# Patient Record
Sex: Male | Born: 1955
Health system: Southern US, Community
[De-identification: ages and names within clinical notes are randomized; demographics above are authoritative.]

## PROBLEM LIST (undated history)

## (undated) DIAGNOSIS — K759 Inflammatory liver disease, unspecified: Secondary | ICD-10-CM

## (undated) DIAGNOSIS — J189 Pneumonia, unspecified organism: Secondary | ICD-10-CM

## (undated) DIAGNOSIS — E785 Hyperlipidemia, unspecified: Secondary | ICD-10-CM

## (undated) DIAGNOSIS — F32A Depression, unspecified: Secondary | ICD-10-CM

## (undated) DIAGNOSIS — E119 Type 2 diabetes mellitus without complications: Secondary | ICD-10-CM

## (undated) DIAGNOSIS — Z87442 Personal history of urinary calculi: Secondary | ICD-10-CM

## (undated) DIAGNOSIS — R0683 Snoring: Secondary | ICD-10-CM

## (undated) DIAGNOSIS — Z91148 Patient's other noncompliance with medication regimen for other reason: Secondary | ICD-10-CM

## (undated) DIAGNOSIS — N289 Disorder of kidney and ureter, unspecified: Secondary | ICD-10-CM

## (undated) DIAGNOSIS — F329 Major depressive disorder, single episode, unspecified: Secondary | ICD-10-CM

## (undated) DIAGNOSIS — G4730 Sleep apnea, unspecified: Secondary | ICD-10-CM

## (undated) DIAGNOSIS — J40 Bronchitis, not specified as acute or chronic: Secondary | ICD-10-CM

## (undated) DIAGNOSIS — I10 Essential (primary) hypertension: Secondary | ICD-10-CM

## (undated) HISTORY — DX: Major depressive disorder, single episode, unspecified: F32.9

## (undated) HISTORY — PX: JOINT REPLACEMENT: SHX530

## (undated) HISTORY — DX: Hyperlipidemia, unspecified: E78.5

## (undated) HISTORY — DX: Snoring: R06.83

## (undated) HISTORY — PX: COLONOSCOPY: SHX174

## (undated) HISTORY — PX: TOTAL HIP ARTHROPLASTY: SHX124

## (undated) HISTORY — DX: Depression, unspecified: F32.A

---
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
## 1998-01-17 ENCOUNTER — Emergency Department (HOSPITAL_COMMUNITY): Admission: EM | Admit: 1998-01-17 | Discharge: 1998-01-17 | Payer: Self-pay | Admitting: Emergency Medicine

## 1998-04-19 ENCOUNTER — Emergency Department (HOSPITAL_COMMUNITY): Admission: EM | Admit: 1998-04-19 | Discharge: 1998-04-19 | Payer: Self-pay | Admitting: Emergency Medicine

## 1998-09-02 ENCOUNTER — Emergency Department (HOSPITAL_COMMUNITY): Admission: EM | Admit: 1998-09-02 | Discharge: 1998-09-02 | Payer: Self-pay | Admitting: Emergency Medicine

## 1998-09-03 ENCOUNTER — Encounter: Payer: Self-pay | Admitting: Emergency Medicine

## 1998-11-17 ENCOUNTER — Emergency Department (HOSPITAL_COMMUNITY): Admission: EM | Admit: 1998-11-17 | Discharge: 1998-11-17 | Payer: Self-pay | Admitting: Emergency Medicine

## 1999-07-05 HISTORY — PX: BACK SURGERY: SHX140

## 1999-07-19 ENCOUNTER — Emergency Department (HOSPITAL_COMMUNITY): Admission: EM | Admit: 1999-07-19 | Discharge: 1999-07-19 | Payer: Self-pay | Admitting: Emergency Medicine

## 2000-04-29 ENCOUNTER — Emergency Department (HOSPITAL_COMMUNITY): Admission: EM | Admit: 2000-04-29 | Discharge: 2000-04-29 | Payer: Self-pay | Admitting: Emergency Medicine

## 2000-04-29 ENCOUNTER — Encounter: Payer: Self-pay | Admitting: Emergency Medicine

## 2000-07-10 ENCOUNTER — Inpatient Hospital Stay (HOSPITAL_COMMUNITY): Admission: RE | Admit: 2000-07-10 | Discharge: 2000-07-14 | Payer: Self-pay | Admitting: Orthopaedic Surgery

## 2001-01-28 ENCOUNTER — Emergency Department (HOSPITAL_COMMUNITY): Admission: EM | Admit: 2001-01-28 | Discharge: 2001-01-28 | Payer: Self-pay | Admitting: Emergency Medicine

## 2001-04-23 ENCOUNTER — Emergency Department (HOSPITAL_COMMUNITY): Admission: EM | Admit: 2001-04-23 | Discharge: 2001-04-24 | Payer: Self-pay | Admitting: Emergency Medicine

## 2001-08-14 ENCOUNTER — Emergency Department (HOSPITAL_COMMUNITY): Admission: EM | Admit: 2001-08-14 | Discharge: 2001-08-14 | Payer: Self-pay | Admitting: Emergency Medicine

## 2001-10-18 ENCOUNTER — Emergency Department (HOSPITAL_COMMUNITY): Admission: EM | Admit: 2001-10-18 | Discharge: 2001-10-19 | Payer: Self-pay | Admitting: Emergency Medicine

## 2002-08-08 ENCOUNTER — Emergency Department (HOSPITAL_COMMUNITY): Admission: EM | Admit: 2002-08-08 | Discharge: 2002-08-08 | Payer: Self-pay | Admitting: Emergency Medicine

## 2002-08-08 ENCOUNTER — Encounter: Payer: Self-pay | Admitting: Emergency Medicine

## 2002-09-29 ENCOUNTER — Encounter: Payer: Self-pay | Admitting: *Deleted

## 2002-09-29 ENCOUNTER — Emergency Department (HOSPITAL_COMMUNITY): Admission: EM | Admit: 2002-09-29 | Discharge: 2002-09-29 | Payer: Self-pay | Admitting: Emergency Medicine

## 2002-12-20 ENCOUNTER — Emergency Department (HOSPITAL_COMMUNITY): Admission: EM | Admit: 2002-12-20 | Discharge: 2002-12-20 | Payer: Self-pay | Admitting: *Deleted

## 2002-12-20 ENCOUNTER — Encounter: Payer: Self-pay | Admitting: *Deleted

## 2004-02-15 ENCOUNTER — Emergency Department (HOSPITAL_COMMUNITY): Admission: EM | Admit: 2004-02-15 | Discharge: 2004-02-15 | Payer: Self-pay | Admitting: *Deleted

## 2004-03-15 ENCOUNTER — Emergency Department (HOSPITAL_COMMUNITY): Admission: EM | Admit: 2004-03-15 | Discharge: 2004-03-15 | Payer: Self-pay | Admitting: Emergency Medicine

## 2005-03-05 ENCOUNTER — Emergency Department (HOSPITAL_COMMUNITY): Admission: EM | Admit: 2005-03-05 | Discharge: 2005-03-05 | Payer: Self-pay | Admitting: Family Medicine

## 2005-07-19 ENCOUNTER — Emergency Department (HOSPITAL_COMMUNITY): Admission: EM | Admit: 2005-07-19 | Discharge: 2005-07-19 | Payer: Self-pay | Admitting: Emergency Medicine

## 2006-02-09 ENCOUNTER — Emergency Department (HOSPITAL_COMMUNITY): Admission: EM | Admit: 2006-02-09 | Discharge: 2006-02-09 | Payer: Self-pay | Admitting: Emergency Medicine

## 2006-04-28 ENCOUNTER — Emergency Department (HOSPITAL_COMMUNITY): Admission: EM | Admit: 2006-04-28 | Discharge: 2006-04-28 | Payer: Self-pay | Admitting: Emergency Medicine

## 2006-06-21 ENCOUNTER — Emergency Department (HOSPITAL_COMMUNITY): Admission: EM | Admit: 2006-06-21 | Discharge: 2006-06-21 | Payer: Self-pay | Admitting: Emergency Medicine

## 2006-09-26 ENCOUNTER — Emergency Department (HOSPITAL_COMMUNITY): Admission: EM | Admit: 2006-09-26 | Discharge: 2006-09-26 | Payer: Self-pay | Admitting: Emergency Medicine

## 2006-09-26 ENCOUNTER — Encounter: Payer: Self-pay | Admitting: Vascular Surgery

## 2006-09-26 ENCOUNTER — Ambulatory Visit: Payer: Self-pay | Admitting: Vascular Surgery

## 2006-10-10 ENCOUNTER — Ambulatory Visit: Payer: Self-pay | Admitting: Internal Medicine

## 2006-10-20 ENCOUNTER — Emergency Department (HOSPITAL_COMMUNITY): Admission: EM | Admit: 2006-10-20 | Discharge: 2006-10-20 | Payer: Self-pay | Admitting: Emergency Medicine

## 2006-10-24 ENCOUNTER — Ambulatory Visit: Payer: Self-pay | Admitting: Internal Medicine

## 2006-12-12 ENCOUNTER — Emergency Department (HOSPITAL_COMMUNITY): Admission: EM | Admit: 2006-12-12 | Discharge: 2006-12-12 | Payer: Self-pay | Admitting: Emergency Medicine

## 2007-01-06 ENCOUNTER — Emergency Department (HOSPITAL_COMMUNITY): Admission: EM | Admit: 2007-01-06 | Discharge: 2007-01-06 | Payer: Self-pay | Admitting: Family Medicine

## 2007-03-01 ENCOUNTER — Emergency Department (HOSPITAL_COMMUNITY): Admission: EM | Admit: 2007-03-01 | Discharge: 2007-03-01 | Payer: Self-pay | Admitting: Emergency Medicine

## 2007-03-03 ENCOUNTER — Emergency Department (HOSPITAL_COMMUNITY): Admission: EM | Admit: 2007-03-03 | Discharge: 2007-03-03 | Payer: Self-pay | Admitting: Emergency Medicine

## 2007-09-19 ENCOUNTER — Emergency Department (HOSPITAL_COMMUNITY): Admission: EM | Admit: 2007-09-19 | Discharge: 2007-09-19 | Payer: Self-pay | Admitting: Emergency Medicine

## 2007-09-23 ENCOUNTER — Emergency Department (HOSPITAL_COMMUNITY): Admission: EM | Admit: 2007-09-23 | Discharge: 2007-09-24 | Payer: Self-pay | Admitting: Emergency Medicine

## 2007-09-30 ENCOUNTER — Emergency Department (HOSPITAL_COMMUNITY): Admission: EM | Admit: 2007-09-30 | Discharge: 2007-09-30 | Payer: Self-pay | Admitting: Emergency Medicine

## 2008-01-22 ENCOUNTER — Emergency Department (HOSPITAL_COMMUNITY): Admission: EM | Admit: 2008-01-22 | Discharge: 2008-01-22 | Payer: Self-pay | Admitting: Emergency Medicine

## 2008-02-20 ENCOUNTER — Emergency Department (HOSPITAL_COMMUNITY): Admission: EM | Admit: 2008-02-20 | Discharge: 2008-02-20 | Payer: Self-pay | Admitting: Emergency Medicine

## 2008-03-02 ENCOUNTER — Emergency Department (HOSPITAL_COMMUNITY): Admission: EM | Admit: 2008-03-02 | Discharge: 2008-03-02 | Payer: Self-pay | Admitting: Emergency Medicine

## 2008-03-31 ENCOUNTER — Emergency Department (HOSPITAL_COMMUNITY): Admission: EM | Admit: 2008-03-31 | Discharge: 2008-03-31 | Payer: Self-pay | Admitting: Emergency Medicine

## 2008-05-22 ENCOUNTER — Emergency Department (HOSPITAL_COMMUNITY): Admission: EM | Admit: 2008-05-22 | Discharge: 2008-05-22 | Payer: Self-pay | Admitting: Emergency Medicine

## 2008-07-01 ENCOUNTER — Emergency Department (HOSPITAL_COMMUNITY): Admission: EM | Admit: 2008-07-01 | Discharge: 2008-07-01 | Payer: Self-pay | Admitting: Emergency Medicine

## 2008-08-25 ENCOUNTER — Emergency Department (HOSPITAL_COMMUNITY): Admission: EM | Admit: 2008-08-25 | Discharge: 2008-08-25 | Payer: Self-pay | Admitting: Emergency Medicine

## 2008-12-26 ENCOUNTER — Encounter: Admission: RE | Admit: 2008-12-26 | Discharge: 2008-12-26 | Payer: Self-pay | Admitting: Orthopaedic Surgery

## 2009-04-16 ENCOUNTER — Emergency Department (HOSPITAL_COMMUNITY): Admission: EM | Admit: 2009-04-16 | Discharge: 2009-04-16 | Payer: Self-pay | Admitting: Emergency Medicine

## 2009-05-15 ENCOUNTER — Emergency Department (HOSPITAL_COMMUNITY): Admission: EM | Admit: 2009-05-15 | Discharge: 2009-05-15 | Payer: Self-pay | Admitting: Emergency Medicine

## 2009-05-23 ENCOUNTER — Emergency Department (HOSPITAL_COMMUNITY): Admission: EM | Admit: 2009-05-23 | Discharge: 2009-05-23 | Payer: Self-pay | Admitting: Emergency Medicine

## 2009-06-08 ENCOUNTER — Inpatient Hospital Stay (HOSPITAL_COMMUNITY): Admission: RE | Admit: 2009-06-08 | Discharge: 2009-06-11 | Payer: Self-pay | Admitting: Orthopaedic Surgery

## 2009-06-12 ENCOUNTER — Emergency Department (HOSPITAL_COMMUNITY): Admission: EM | Admit: 2009-06-12 | Discharge: 2009-06-13 | Payer: Self-pay | Admitting: Emergency Medicine

## 2009-06-21 ENCOUNTER — Emergency Department (HOSPITAL_COMMUNITY): Admission: EM | Admit: 2009-06-21 | Discharge: 2009-06-21 | Payer: Self-pay | Admitting: Emergency Medicine

## 2009-07-06 ENCOUNTER — Emergency Department (HOSPITAL_COMMUNITY): Admission: EM | Admit: 2009-07-06 | Discharge: 2009-07-06 | Payer: Self-pay | Admitting: Emergency Medicine

## 2009-07-12 ENCOUNTER — Emergency Department (HOSPITAL_COMMUNITY): Admission: EM | Admit: 2009-07-12 | Discharge: 2009-07-12 | Payer: Self-pay | Admitting: Emergency Medicine

## 2009-08-25 ENCOUNTER — Emergency Department (HOSPITAL_COMMUNITY): Admission: EM | Admit: 2009-08-25 | Discharge: 2009-08-25 | Payer: Self-pay | Admitting: Emergency Medicine

## 2009-10-13 ENCOUNTER — Encounter: Admission: RE | Admit: 2009-10-13 | Discharge: 2009-10-13 | Payer: Self-pay | Admitting: Nephrology

## 2009-10-28 ENCOUNTER — Emergency Department (HOSPITAL_COMMUNITY): Admission: EM | Admit: 2009-10-28 | Discharge: 2009-10-28 | Payer: Self-pay | Admitting: Emergency Medicine

## 2009-12-25 ENCOUNTER — Emergency Department (HOSPITAL_COMMUNITY): Admission: EM | Admit: 2009-12-25 | Discharge: 2009-12-25 | Payer: Self-pay | Admitting: Emergency Medicine

## 2010-02-14 ENCOUNTER — Emergency Department (HOSPITAL_COMMUNITY): Admission: EM | Admit: 2010-02-14 | Discharge: 2010-02-15 | Payer: Self-pay | Admitting: Emergency Medicine

## 2010-03-16 ENCOUNTER — Emergency Department (HOSPITAL_COMMUNITY): Admission: EM | Admit: 2010-03-16 | Discharge: 2010-03-16 | Payer: Self-pay | Admitting: Emergency Medicine

## 2010-05-20 ENCOUNTER — Emergency Department (HOSPITAL_COMMUNITY): Admission: EM | Admit: 2010-05-20 | Discharge: 2010-05-20 | Payer: Self-pay | Admitting: Emergency Medicine

## 2010-06-18 ENCOUNTER — Emergency Department (HOSPITAL_COMMUNITY)
Admission: EM | Admit: 2010-06-18 | Discharge: 2010-06-18 | Payer: Self-pay | Source: Home / Self Care | Admitting: Emergency Medicine

## 2010-08-10 ENCOUNTER — Emergency Department (HOSPITAL_COMMUNITY)
Admission: EM | Admit: 2010-08-10 | Discharge: 2010-08-10 | Disposition: A | Discharge status: Home / Self Care | Payer: Medicare Other | Attending: Emergency Medicine | Admitting: Emergency Medicine

## 2010-08-10 DIAGNOSIS — J3489 Other specified disorders of nose and nasal sinuses: Secondary | ICD-10-CM | POA: Insufficient documentation

## 2010-08-10 DIAGNOSIS — K219 Gastro-esophageal reflux disease without esophagitis: Secondary | ICD-10-CM | POA: Insufficient documentation

## 2010-08-10 DIAGNOSIS — R0982 Postnasal drip: Secondary | ICD-10-CM | POA: Insufficient documentation

## 2010-08-10 DIAGNOSIS — R05 Cough: Secondary | ICD-10-CM | POA: Insufficient documentation

## 2010-08-10 DIAGNOSIS — J069 Acute upper respiratory infection, unspecified: Secondary | ICD-10-CM | POA: Insufficient documentation

## 2010-08-10 DIAGNOSIS — I10 Essential (primary) hypertension: Secondary | ICD-10-CM | POA: Insufficient documentation

## 2010-08-10 DIAGNOSIS — R059 Cough, unspecified: Secondary | ICD-10-CM | POA: Insufficient documentation

## 2010-08-10 DIAGNOSIS — J029 Acute pharyngitis, unspecified: Secondary | ICD-10-CM | POA: Insufficient documentation

## 2010-09-15 LAB — BASIC METABOLIC PANEL
CO2: 25 mEq/L (ref 19–32)
Calcium: 9.5 mg/dL (ref 8.4–10.5)
Chloride: 106 mEq/L (ref 96–112)
Potassium: 3.5 mEq/L (ref 3.5–5.1)
Sodium: 136 mEq/L (ref 135–145)

## 2010-09-15 LAB — CBC
Hemoglobin: 13.8 g/dL (ref 13.0–17.0)
MCV: 95 fL (ref 78.0–100.0)
Platelets: 240 10*3/uL (ref 150–400)
RBC: 4.17 MIL/uL — ABNORMAL LOW (ref 4.22–5.81)
WBC: 6.3 10*3/uL (ref 4.0–10.5)

## 2010-09-15 LAB — POCT CARDIAC MARKERS
Myoglobin, poc: 157 ng/mL (ref 12–200)
Troponin i, poc: <0.05 ng/mL (ref 0.00–0.09)
Troponin i, poc: <0.05 ng/mL (ref 0.00–0.09)

## 2010-09-17 LAB — DIFFERENTIAL
Lymphocytes Relative: 34 % (ref 12–46)
Lymphs Abs: 2.3 10*3/uL (ref 0.7–4.0)
Neutrophils Relative %: 58 % (ref 43–77)

## 2010-09-17 LAB — CBC
HCT: 41.3 % (ref 39.0–52.0)
Hemoglobin: 14.5 g/dL (ref 13.0–17.0)
MCH: 33.3 pg (ref 26.0–34.0)
MCHC: 35.1 g/dL (ref 30.0–36.0)

## 2010-09-17 LAB — COMPREHENSIVE METABOLIC PANEL
BUN: 12 mg/dL (ref 6–23)
Calcium: 9.8 mg/dL (ref 8.4–10.5)
Glucose, Bld: 90 mg/dL (ref 70–99)
Sodium: 141 mEq/L (ref 135–145)
Total Protein: 7.9 g/dL (ref 6.0–8.3)

## 2010-09-17 LAB — LIPASE, BLOOD: Lipase: 21 U/L (ref 11–59)

## 2010-09-17 LAB — URINALYSIS, ROUTINE W REFLEX MICROSCOPIC
Leukocytes, UA: NEGATIVE
Nitrite: NEGATIVE
Specific Gravity, Urine: 1.023 (ref 1.005–1.030)
pH: 5.5 (ref 5.0–8.0)

## 2010-09-17 LAB — POCT CARDIAC MARKERS
CKMB, poc: 2.7 ng/mL (ref 1.0–8.0)
Myoglobin, poc: 200 ng/mL (ref 12–200)

## 2010-09-17 LAB — URINE MICROSCOPIC-ADD ON

## 2010-09-17 LAB — RAPID URINE DRUG SCREEN, HOSP PERFORMED: Amphetamines: NOT DETECTED

## 2010-09-21 LAB — URINALYSIS, ROUTINE W REFLEX MICROSCOPIC
Glucose, UA: NEGATIVE mg/dL
Ketones, ur: NEGATIVE mg/dL
Leukocytes, UA: NEGATIVE
Protein, ur: 100 mg/dL — AB

## 2010-09-21 LAB — URINE MICROSCOPIC-ADD ON

## 2010-10-05 LAB — BASIC METABOLIC PANEL
BUN: 8 mg/dL (ref 6–23)
CO2: 31 mEq/L (ref 19–32)
Chloride: 103 mEq/L (ref 96–112)
Chloride: 98 mEq/L (ref 96–112)
GFR calc Af Amer: >60 mL/min (ref >60)
GFR calc non Af Amer: >60 mL/min (ref >60)
Glucose, Bld: 121 mg/dL — ABNORMAL HIGH (ref 70–99)
Potassium: 3.1 mEq/L — ABNORMAL LOW (ref 3.5–5.1)
Potassium: 3.4 mEq/L — ABNORMAL LOW (ref 3.5–5.1)

## 2010-10-05 LAB — URINALYSIS, ROUTINE W REFLEX MICROSCOPIC
Bilirubin Urine: NEGATIVE
Glucose, UA: NEGATIVE mg/dL
Glucose, UA: NEGATIVE mg/dL
Ketones, ur: NEGATIVE mg/dL
Ketones, ur: NEGATIVE mg/dL
Leukocytes, UA: NEGATIVE
Leukocytes, UA: NEGATIVE
Nitrite: NEGATIVE
Protein, ur: 30 mg/dL — AB
Protein, ur: 30 mg/dL — AB

## 2010-10-05 LAB — COMPREHENSIVE METABOLIC PANEL
Alkaline Phosphatase: 89 U/L (ref 39–117)
BUN: 8 mg/dL (ref 6–23)
CO2: 28 mEq/L (ref 19–32)
Chloride: 105 mEq/L (ref 96–112)
GFR calc non Af Amer: >60 mL/min (ref >60)
Glucose, Bld: 90 mg/dL (ref 70–99)
Potassium: 4 mEq/L (ref 3.5–5.1)
Total Bilirubin: 1.3 mg/dL — ABNORMAL HIGH (ref 0.3–1.2)

## 2010-10-05 LAB — CBC
HCT: 26.6 % — ABNORMAL LOW (ref 39.0–52.0)
Hemoglobin: 14.3 g/dL (ref 13.0–17.0)
MCHC: 34.6 g/dL (ref 30.0–36.0)
MCV: 97.3 fL (ref 78.0–100.0)
RBC: 2.73 MIL/uL — ABNORMAL LOW (ref 4.22–5.81)
RBC: 4.26 MIL/uL (ref 4.22–5.81)
WBC: 11.1 10*3/uL — ABNORMAL HIGH (ref 4.0–10.5)
WBC: 5.9 10*3/uL (ref 4.0–10.5)

## 2010-10-05 LAB — POCT CARDIAC MARKERS
Myoglobin, poc: >500 ng/mL (ref 12–200)
Troponin i, poc: <0.05 ng/mL (ref 0.00–0.09)

## 2010-10-05 LAB — DIFFERENTIAL
Basophils Absolute: 0 10*3/uL (ref 0.0–0.1)
Basophils Relative: 1 % (ref 0–1)
Eosinophils Absolute: 0.3 10*3/uL (ref 0.0–0.7)
Eosinophils Relative: 3 % (ref 0–5)
Lymphocytes Relative: 11 % — ABNORMAL LOW (ref 12–46)
Lymphs Abs: 1.3 10*3/uL (ref 0.7–4.0)
Monocytes Absolute: 0.4 10*3/uL (ref 0.1–1.0)
Monocytes Relative: 8 % (ref 3–12)
Neutro Abs: 3.6 10*3/uL (ref 1.7–7.7)
Neutrophils Relative %: 61 % (ref 43–77)
Neutrophils Relative %: 78 % — ABNORMAL HIGH (ref 43–77)

## 2010-10-05 LAB — TYPE AND SCREEN
ABO/RH(D): A POS
Antibody Screen: NEGATIVE

## 2010-10-05 LAB — HEMOGLOBIN AND HEMATOCRIT, BLOOD
HCT: 31.1 % — ABNORMAL LOW (ref 39.0–52.0)
Hemoglobin: 10.6 g/dL — ABNORMAL LOW (ref 13.0–17.0)

## 2010-10-05 LAB — URINE MICROSCOPIC-ADD ON

## 2010-10-05 LAB — POCT I-STAT 4, (NA,K, GLUC, HGB,HCT): Sodium: 140 mEq/L (ref 135–145)

## 2010-10-05 LAB — ABO/RH: ABO/RH(D): A POS

## 2010-10-27 ENCOUNTER — Emergency Department (HOSPITAL_COMMUNITY)
Admission: EM | Admit: 2010-10-27 | Discharge: 2010-10-27 | Disposition: A | Discharge status: Home / Self Care | Payer: Medicare Other | Attending: Emergency Medicine | Admitting: Emergency Medicine

## 2010-10-27 DIAGNOSIS — M545 Low back pain, unspecified: Secondary | ICD-10-CM | POA: Insufficient documentation

## 2010-10-27 DIAGNOSIS — K219 Gastro-esophageal reflux disease without esophagitis: Secondary | ICD-10-CM | POA: Insufficient documentation

## 2010-10-27 DIAGNOSIS — G8929 Other chronic pain: Secondary | ICD-10-CM | POA: Insufficient documentation

## 2010-10-27 DIAGNOSIS — M129 Arthropathy, unspecified: Secondary | ICD-10-CM | POA: Insufficient documentation

## 2010-10-27 DIAGNOSIS — I10 Essential (primary) hypertension: Secondary | ICD-10-CM | POA: Insufficient documentation

## 2010-11-19 NOTE — Op Note (Signed)
[place]. [facility]  Patient:    Peter Manning, Peter Manning                      MRN: [id number] Proc. Date: 07/10/00 Adm. Date:  [id number] Disc. Date: [id number] Attending:  Lorre Nick                           Operative Report  PREOPERATIVE DIAGNOSIS:  Left hip osteoarthritis.  POSTOPERATIVE DIAGNOSIS:  Left hip osteoarthritis.  PROCEDURE:  Left total hip arthroplasty.  SURGEON:  Annell Greening, M.D.  ASSISTANT:  Odette Fraction, M.D.  ANESTHESIA:   GOT.  ESTIMATED BLOOD LOSS:  500 mL.  DESCRIPTION OF PROCEDURE:  After induction of general anesthesia, orotracheal intubation, preoperative Ancef prophylaxis, patient placed in lateral position with axillary roll and careful padding using the Mark VII hip frame for stabilization.  Tincture of benzoin, 10-15 drape and DuraPrep down the ankle was performed and the usual total hip sheets, drapes, impervious stockinette, Coban, sterile skin marker, and Betadine Vi-drape x 2 ______ the skin.  A posterior approach was used with the initial incision drawn with the hip flexed at 90 degrees going directly over the greater trochanter in line with the gluteus fibers.  A self-retaining retractor was placed.  Piriformis was tagged, cut, posterior capsule was cut.  Hip would sublux but would not dislocate due to the extremely hypertrophic ligamentum teres.  The head had to be delivered by cutting the neck and then cutting the head in pieces until the ligamentum could finally be reached and then cut with a scalpel since Mayo scissors would not cut the ligament.  There was an area of exposed subchondral bone in the labrum portion and some marginal osteophytes occurring about the femur.  Posterior and anterior capsule was cut, flushed with the rim, and labrum was removed.  Canal starter reamer was used in the femur followed by the trochanteric reamer and sequential reaming.  Broaching up to a #7, and then distal reaming to 13.5.   Calcar reamer was used and neck was cut 1 fingerbreadth above the lesser trochanter.  Virgel Paling were placed since sequential reaming of the acetabulum up to 52 size.  A 52 would barely fit in and was tight, 54 would not fit 52 and a no hole cup was selected, impacted into place with the 10 degree liner dialed in at the posterior superior position using the ASIS-2 sciatic notch line and preoperative standing lateral x-ray as well as orientation using a finger in the sciatic notch and the anterior superior iliac spine.  This lined up with the corner of the room with 45 degrees of abduction. Stability was tested initially with the trial liner and showed no impingement with abduction, no tightness of the flexors with neutral hip and knee flexion past 90 degrees.  The hip would externally rotate and was stable even in hyperextension.  Flexion to 90 degrees, abduction 30 degrees, internal rotation 80 degrees before the hip would begin to sublux.  No shuck with full pulling on the leg.  A plus 5 was selected.  Permanent femoral prosthesis was inserted, a plus 5 neck.  The hip was reduced.  Piriformis was repaired to the gluteus medius fascia.  Tensor fascia was closed with 0 Tycron sutures, 0 Vicryl in the gluteus maximus fascia, 2-0 Vicryl in the subcutaneous tissue, Marcaine infiltration of the skin, skin staple closure, and a postoperative dressing with a  knee immobilizer.  The patient tolerated the procedure well and was transferred to the recovery room in stable condition.  COMPONENTS USED:  Osteonics Omnifit 10 degree cup, 52 mm HAPSL shell secure fit, primary secure fit plus hip stem #7 size, plus 5 ball, and a 28 mm ball. DD:  07/10/00 TD:  07/10/00 Job: [id number]

## 2010-11-19 NOTE — Discharge Summary (Signed)
[place]. [facility]  Patient:    Peter Manning, Peter Manning                      MRN: [id number] Adm. Date:  [id number] Disc. Date: [id number] Attending:  Jacki Cones                           Discharge Summary  FINAL DIAGNOSIS:  Left hip osteoarthritis.  PROCEDURE:  Left total hip arthroplasty on July 10, 2000, by [employer] C. Ophelia Charter, M.D. with Colon Flattery. Ollen Bowl, M.D. assisting.  This 55 year old male has had left hip pain, no previous injury, and increased pain over the last few years with flank pain, no response to anti-inflammatory, bone-on-bone changes on plain radiographs, 2 cm acetabular cyst with sclerosis and marginal osteophytes.  Numerous anti-inflammatories have not been helpful, and he has had significant Trendelenburg gait and great difficulty working.  HABITS:  The patient smokes 1 pack-per-day.  MEDICATIONS:  He is currently on Voltaren 75 mg 1 p.o. b.i.d. at the time of admission.  Findings on his hip showed only 10 degrees internal rotation, 20 degrees external rotation, flexion to 100 degrees, and 2+ pulses with sciatic nerve function intact.  HOSPITAL COURSE:  The patient was admitted.  Preop labs showed a hemoglobin of 13.7, normal PT/PTT, normal urinalysis other than trace hemoglobin and 1+ urobilinogen.  The patient with blood type A+.  CMET showed normal hepatocellular enzymes and normal electrolytes.  The patient was taken to the operating room where he underwent placement of an Osteonics Omnifit 10 degree cup, 52 mm HA PSL shell secure-fit.  Primary secure-fit plus hip stem #7 size, +5 neck length, and 28 mm ball.  The patient received preoperative and postoperative Ancef prophylaxis, postop Coumadin DVT prophylaxis, PT/OT protocol, and 50% partial weightbearing.  IV was decreased to [facility].  He was seen by pharmacy for Coumadin protocol.  Hemoglobin was 11.1 postop day one and then 9.9 and stable on postop day four.  He was started  on some iron.  Protimes were therapeutic at 2.1.  He made rapid progress with physical therapy.  Dressing was dry; leg lengths were equal.  X-ray showed position of the prosthesis.  DISCHARGE MEDICATIONS: 1. Coumadin 2.5 mg p.o. x 10 days and stop. 2. Tylox for pain.  Follow up in 7-10 days.  Home PT 2-3 times per week x 3 weeks.  Hip abduction exercises, standard walker, 3-in-1 commode, and [employer] tub mats.  CONDITION ON DISCHARGE:  Satisfactory.  He had good relief of his preop hip pain. DD:  08/23/00 TD:  08/25/00 Job: [id number]

## 2010-12-26 ENCOUNTER — Emergency Department (HOSPITAL_COMMUNITY): Payer: Medicare Other

## 2010-12-26 ENCOUNTER — Emergency Department (HOSPITAL_COMMUNITY)
Admission: EM | Admit: 2010-12-26 | Discharge: 2010-12-26 | Disposition: A | Discharge status: Home / Self Care | Payer: Medicare Other | Attending: Emergency Medicine | Admitting: Emergency Medicine

## 2010-12-26 DIAGNOSIS — I10 Essential (primary) hypertension: Secondary | ICD-10-CM | POA: Insufficient documentation

## 2010-12-26 DIAGNOSIS — G8929 Other chronic pain: Secondary | ICD-10-CM | POA: Insufficient documentation

## 2010-12-26 DIAGNOSIS — R05 Cough: Secondary | ICD-10-CM | POA: Insufficient documentation

## 2010-12-26 DIAGNOSIS — M545 Low back pain, unspecified: Secondary | ICD-10-CM | POA: Insufficient documentation

## 2010-12-26 DIAGNOSIS — R059 Cough, unspecified: Secondary | ICD-10-CM | POA: Insufficient documentation

## 2011-01-06 ENCOUNTER — Emergency Department (HOSPITAL_COMMUNITY): Payer: Medicare Other

## 2011-01-06 ENCOUNTER — Emergency Department (HOSPITAL_COMMUNITY)
Admission: EM | Admit: 2011-01-06 | Discharge: 2011-01-06 | Disposition: A | Discharge status: Home / Self Care | Payer: Medicare Other | Attending: Emergency Medicine | Admitting: Emergency Medicine

## 2011-01-06 DIAGNOSIS — M545 Low back pain, unspecified: Secondary | ICD-10-CM | POA: Insufficient documentation

## 2011-01-06 DIAGNOSIS — K219 Gastro-esophageal reflux disease without esophagitis: Secondary | ICD-10-CM | POA: Insufficient documentation

## 2011-01-06 DIAGNOSIS — J40 Bronchitis, not specified as acute or chronic: Secondary | ICD-10-CM | POA: Insufficient documentation

## 2011-01-06 DIAGNOSIS — I10 Essential (primary) hypertension: Secondary | ICD-10-CM | POA: Insufficient documentation

## 2011-01-12 ENCOUNTER — Ambulatory Visit: Payer: Medicare Other | Attending: Orthopaedic Surgery

## 2011-02-01 ENCOUNTER — Other Ambulatory Visit: Payer: Self-pay | Admitting: Orthopaedic Surgery

## 2011-02-01 DIAGNOSIS — Q163 Congenital malformation of ear ossicles: Secondary | ICD-10-CM

## 2011-02-01 DIAGNOSIS — R52 Pain, unspecified: Secondary | ICD-10-CM

## 2011-02-02 ENCOUNTER — Emergency Department (HOSPITAL_COMMUNITY)
Admission: EM | Admit: 2011-02-02 | Discharge: 2011-02-02 | Disposition: A | Discharge status: Home / Self Care | Payer: Medicare Other | Attending: Emergency Medicine | Admitting: Emergency Medicine

## 2011-02-02 ENCOUNTER — Emergency Department (HOSPITAL_COMMUNITY): Payer: Medicare Other

## 2011-02-02 ENCOUNTER — Ambulatory Visit
Admission: RE | Admit: 2011-02-02 | Discharge: 2011-02-02 | Disposition: A | Discharge status: Home / Self Care | Payer: Medicare Other | Source: Ambulatory Visit | Attending: Orthopaedic Surgery | Admitting: Orthopaedic Surgery

## 2011-02-02 DIAGNOSIS — Q163 Congenital malformation of ear ossicles: Secondary | ICD-10-CM

## 2011-02-02 DIAGNOSIS — M549 Dorsalgia, unspecified: Secondary | ICD-10-CM | POA: Insufficient documentation

## 2011-02-02 DIAGNOSIS — K219 Gastro-esophageal reflux disease without esophagitis: Secondary | ICD-10-CM | POA: Insufficient documentation

## 2011-02-02 DIAGNOSIS — Z79899 Other long term (current) drug therapy: Secondary | ICD-10-CM | POA: Insufficient documentation

## 2011-02-02 DIAGNOSIS — M79609 Pain in unspecified limb: Secondary | ICD-10-CM | POA: Insufficient documentation

## 2011-02-02 DIAGNOSIS — I10 Essential (primary) hypertension: Secondary | ICD-10-CM | POA: Insufficient documentation

## 2011-02-02 DIAGNOSIS — R52 Pain, unspecified: Secondary | ICD-10-CM

## 2011-02-02 DIAGNOSIS — M129 Arthropathy, unspecified: Secondary | ICD-10-CM | POA: Insufficient documentation

## 2011-02-02 DIAGNOSIS — G8929 Other chronic pain: Secondary | ICD-10-CM | POA: Insufficient documentation

## 2011-02-02 DIAGNOSIS — L723 Sebaceous cyst: Secondary | ICD-10-CM | POA: Insufficient documentation

## 2011-02-02 DIAGNOSIS — M7989 Other specified soft tissue disorders: Secondary | ICD-10-CM | POA: Insufficient documentation

## 2011-02-10 ENCOUNTER — Emergency Department (HOSPITAL_COMMUNITY): Payer: Medicare Other

## 2011-02-10 ENCOUNTER — Emergency Department (HOSPITAL_COMMUNITY)
Admission: EM | Admit: 2011-02-10 | Discharge: 2011-02-11 | Disposition: A | Discharge status: Home / Self Care | Payer: Medicare Other | Attending: Emergency Medicine | Admitting: Emergency Medicine

## 2011-02-10 DIAGNOSIS — R079 Chest pain, unspecified: Secondary | ICD-10-CM | POA: Insufficient documentation

## 2011-02-10 DIAGNOSIS — R319 Hematuria, unspecified: Secondary | ICD-10-CM | POA: Insufficient documentation

## 2011-02-10 DIAGNOSIS — M545 Low back pain, unspecified: Secondary | ICD-10-CM | POA: Insufficient documentation

## 2011-02-10 DIAGNOSIS — Q619 Cystic kidney disease, unspecified: Secondary | ICD-10-CM | POA: Insufficient documentation

## 2011-02-10 HISTORY — DX: Essential (primary) hypertension: I10

## 2011-02-10 LAB — URINALYSIS, ROUTINE W REFLEX MICROSCOPIC
Bilirubin Urine: NEGATIVE
Glucose, UA: NEGATIVE mg/dL
Ketones, ur: NEGATIVE mg/dL
Specific Gravity, Urine: 1.019 (ref 1.005–1.030)
pH: 6.5 (ref 5.0–8.0)

## 2011-02-10 LAB — URINE MICROSCOPIC-ADD ON

## 2011-02-11 ENCOUNTER — Emergency Department (HOSPITAL_COMMUNITY): Payer: Medicare Other

## 2011-02-11 ENCOUNTER — Encounter (HOSPITAL_COMMUNITY): Payer: Self-pay | Admitting: Radiology

## 2011-02-11 LAB — POCT I-STAT, CHEM 8
Chloride: 102 mEq/L (ref 96–112)
HCT: 42 % (ref 39.0–52.0)
Potassium: 3.4 mEq/L — ABNORMAL LOW (ref 3.5–5.1)
Sodium: 142 mEq/L (ref 135–145)

## 2011-02-12 LAB — URINE CULTURE
Colony Count: NO GROWTH
Culture  Setup Time: 201208100941

## 2011-03-31 ENCOUNTER — Emergency Department (HOSPITAL_COMMUNITY)
Admission: EM | Admit: 2011-03-31 | Discharge: 2011-03-31 | Disposition: A | Discharge status: Home / Self Care | Payer: Medicare Other | Attending: Emergency Medicine | Admitting: Emergency Medicine

## 2011-03-31 ENCOUNTER — Emergency Department (HOSPITAL_COMMUNITY): Payer: Medicare Other

## 2011-03-31 DIAGNOSIS — M129 Arthropathy, unspecified: Secondary | ICD-10-CM | POA: Insufficient documentation

## 2011-03-31 DIAGNOSIS — M545 Low back pain, unspecified: Secondary | ICD-10-CM | POA: Insufficient documentation

## 2011-03-31 DIAGNOSIS — Z79899 Other long term (current) drug therapy: Secondary | ICD-10-CM | POA: Insufficient documentation

## 2011-03-31 DIAGNOSIS — K219 Gastro-esophageal reflux disease without esophagitis: Secondary | ICD-10-CM | POA: Insufficient documentation

## 2011-03-31 DIAGNOSIS — I10 Essential (primary) hypertension: Secondary | ICD-10-CM | POA: Insufficient documentation

## 2011-03-31 DIAGNOSIS — J3489 Other specified disorders of nose and nasal sinuses: Secondary | ICD-10-CM | POA: Insufficient documentation

## 2011-03-31 DIAGNOSIS — R059 Cough, unspecified: Secondary | ICD-10-CM | POA: Insufficient documentation

## 2011-03-31 DIAGNOSIS — G8929 Other chronic pain: Secondary | ICD-10-CM | POA: Insufficient documentation

## 2011-03-31 DIAGNOSIS — R05 Cough: Secondary | ICD-10-CM | POA: Insufficient documentation

## 2011-03-31 DIAGNOSIS — J40 Bronchitis, not specified as acute or chronic: Secondary | ICD-10-CM | POA: Insufficient documentation

## 2011-04-17 ENCOUNTER — Emergency Department (HOSPITAL_COMMUNITY)
Admission: EM | Admit: 2011-04-17 | Discharge: 2011-04-18 | Disposition: A | Discharge status: Home / Self Care | Payer: Medicare Other | Attending: Emergency Medicine | Admitting: Emergency Medicine

## 2011-04-17 DIAGNOSIS — I10 Essential (primary) hypertension: Secondary | ICD-10-CM | POA: Insufficient documentation

## 2011-04-17 DIAGNOSIS — M129 Arthropathy, unspecified: Secondary | ICD-10-CM | POA: Insufficient documentation

## 2011-04-17 DIAGNOSIS — L0231 Cutaneous abscess of buttock: Secondary | ICD-10-CM | POA: Insufficient documentation

## 2011-04-17 DIAGNOSIS — Z9889 Other specified postprocedural states: Secondary | ICD-10-CM | POA: Insufficient documentation

## 2011-04-17 DIAGNOSIS — K219 Gastro-esophageal reflux disease without esophagitis: Secondary | ICD-10-CM | POA: Insufficient documentation

## 2011-04-17 DIAGNOSIS — L03317 Cellulitis of buttock: Secondary | ICD-10-CM | POA: Insufficient documentation

## 2011-04-19 ENCOUNTER — Emergency Department (HOSPITAL_COMMUNITY)
Admission: EM | Admit: 2011-04-19 | Discharge: 2011-04-19 | Disposition: A | Discharge status: Home / Self Care | Payer: Medicare Other | Attending: Emergency Medicine | Admitting: Emergency Medicine

## 2011-04-19 DIAGNOSIS — L0231 Cutaneous abscess of buttock: Secondary | ICD-10-CM | POA: Insufficient documentation

## 2011-04-19 DIAGNOSIS — Z9889 Other specified postprocedural states: Secondary | ICD-10-CM | POA: Insufficient documentation

## 2011-04-19 DIAGNOSIS — IMO0001 Reserved for inherently not codable concepts without codable children: Secondary | ICD-10-CM | POA: Insufficient documentation

## 2011-04-19 DIAGNOSIS — M129 Arthropathy, unspecified: Secondary | ICD-10-CM | POA: Insufficient documentation

## 2011-04-19 DIAGNOSIS — Z4800 Encounter for change or removal of nonsurgical wound dressing: Secondary | ICD-10-CM | POA: Insufficient documentation

## 2011-04-19 DIAGNOSIS — I10 Essential (primary) hypertension: Secondary | ICD-10-CM | POA: Insufficient documentation

## 2011-04-19 DIAGNOSIS — K219 Gastro-esophageal reflux disease without esophagitis: Secondary | ICD-10-CM | POA: Insufficient documentation

## 2011-06-05 ENCOUNTER — Encounter (HOSPITAL_COMMUNITY): Payer: Self-pay | Admitting: Emergency Medicine

## 2011-06-05 ENCOUNTER — Emergency Department (HOSPITAL_COMMUNITY)
Admission: EM | Admit: 2011-06-05 | Discharge: 2011-06-05 | Disposition: A | Discharge status: Home / Self Care | Payer: Medicare Other | Attending: Emergency Medicine | Admitting: Emergency Medicine

## 2011-06-05 ENCOUNTER — Emergency Department (HOSPITAL_COMMUNITY): Payer: Medicare Other

## 2011-06-05 DIAGNOSIS — M545 Low back pain, unspecified: Secondary | ICD-10-CM | POA: Insufficient documentation

## 2011-06-05 DIAGNOSIS — R209 Unspecified disturbances of skin sensation: Secondary | ICD-10-CM | POA: Insufficient documentation

## 2011-06-05 DIAGNOSIS — M549 Dorsalgia, unspecified: Secondary | ICD-10-CM

## 2011-06-05 MED ORDER — OXYCODONE-ACETAMINOPHEN 5-325 MG PO TABS: 2.0000 | ORAL_TABLET | ORAL | Status: DC | PRN

## 2011-06-05 MED ORDER — HYDROMORPHONE HCL PF 1 MG/ML IJ SOLN
1.0000 mg | Freq: Once | INTRAMUSCULAR | Status: AC
  Administered 2011-06-05: 1 mg via INTRAMUSCULAR
  Filled 2011-06-05: qty 1

## 2011-06-05 MED ORDER — CYCLOBENZAPRINE HCL 10 MG PO TABS: 10.0000 mg | ORAL_TABLET | Freq: Two times a day (BID) | ORAL | Status: DC | PRN

## 2011-06-05 NOTE — ED Notes (Signed)
C/o "tingling" pain in lower back and both legs since last night.  Reports non-productive cough since yesterday.

## 2011-06-05 NOTE — ED Provider Notes (Signed)
History     CSN: [id number] Arrival date & time: 06/05/2011  7:27 PM   First MD Initiated Contact with Patient 06/05/11 2109      Chief Complaint  Patient presents with  . Back Pain   patient with a known history of chronic back pain. States had back surgery approximately 1 year ago. He's had increasing diffuse bilateral lower back pain since last night. He also complains of "numbness" going down both legs. However, he has had no focal motor weakness. Denies any bowel or bladder incontinence. Denies any difficulty with gait. He is taking hydrocodone at home with moderate relief. He's had no fevers no dysuria.  (Consider location/radiation/quality/duration/timing/severity/associated sxs/prior treatment) HPI  Past Medical History  Diagnosis Date  . Hypertension     Past Surgical History  Procedure Date  . Joint replacement     No family history on file.  History  Substance Use Topics  . Smoking status: Current Everyday Smoker  . Smokeless tobacco: Not on file  . Alcohol Use: Yes      Review of Systems  All other systems reviewed and are negative.    Allergies  Review of patient's allergies indicates no known allergies.  Home Medications   Current Outpatient Rx  Name Route Sig Dispense Refill  . HYDROCODONE-ACETAMINOPHEN 5-500 MG PO TABS Oral Take 1 tablet by mouth every 4 (four) hours as needed. For pain     . LISINOPRIL 10 MG PO TABS Oral Take 10 mg by mouth daily.        BP 146/75  Pulse 83  Temp(Src) 98.3 F (36.8 C) (Oral)  Resp 20  SpO2 95%  Physical Exam  Nursing note and vitals reviewed. Constitutional: He is oriented to person, place, and time. He appears well-developed and well-nourished.  HENT:  Head: Normocephalic and atraumatic.  Eyes: Conjunctivae and EOM are normal. Pupils are equal, round, and reactive to light.  Neck: Neck supple.  Cardiovascular: Normal rate and regular rhythm.  Exam reveals no gallop and no friction rub.   No murmur  heard. Pulmonary/Chest: Breath sounds normal. He has no wheezes. He has no rales. He exhibits no tenderness.  Abdominal: Soft. Bowel sounds are normal. He exhibits no distension. There is no tenderness. There is no rebound and no guarding.  Musculoskeletal: Normal range of motion.       Diffuse low back tenderness.  No redness.  No swelling.  No crepitus.  Neurological: He is alert and oriented to person, place, and time. He has normal reflexes. No cranial nerve deficit. Coordination normal.  Skin: Skin is warm and dry. No rash noted.  Psychiatric: He has a normal mood and affect.    ED Course  Procedures (including critical care time)  Labs Reviewed - No data to display No results found.   No diagnosis found.    MDM  Pt is seen and examined;  Initial history and physical completed.  Will follow.          Pecola Haxton A. Patrica Duel, MD 06/05/11 2246

## 2011-06-05 NOTE — ED Notes (Signed)
Pt c/o pain to lower back pain and radiates into both legs, pt given pain meds and will be moved to xray

## 2011-06-06 ENCOUNTER — Encounter (HOSPITAL_COMMUNITY): Payer: Self-pay

## 2011-06-06 ENCOUNTER — Emergency Department (HOSPITAL_COMMUNITY)
Admission: EM | Admit: 2011-06-06 | Discharge: 2011-06-06 | Disposition: A | Discharge status: Home / Self Care | Payer: Medicare Other | Attending: Emergency Medicine | Admitting: Emergency Medicine

## 2011-06-06 ENCOUNTER — Emergency Department (HOSPITAL_COMMUNITY): Payer: Medicare Other

## 2011-06-06 DIAGNOSIS — M549 Dorsalgia, unspecified: Secondary | ICD-10-CM | POA: Insufficient documentation

## 2011-06-06 DIAGNOSIS — L0231 Cutaneous abscess of buttock: Secondary | ICD-10-CM | POA: Insufficient documentation

## 2011-06-06 DIAGNOSIS — R059 Cough, unspecified: Secondary | ICD-10-CM | POA: Insufficient documentation

## 2011-06-06 DIAGNOSIS — R1110 Vomiting, unspecified: Secondary | ICD-10-CM | POA: Insufficient documentation

## 2011-06-06 DIAGNOSIS — J40 Bronchitis, not specified as acute or chronic: Secondary | ICD-10-CM | POA: Insufficient documentation

## 2011-06-06 DIAGNOSIS — R0989 Other specified symptoms and signs involving the circulatory and respiratory systems: Secondary | ICD-10-CM | POA: Insufficient documentation

## 2011-06-06 DIAGNOSIS — I10 Essential (primary) hypertension: Secondary | ICD-10-CM | POA: Insufficient documentation

## 2011-06-06 DIAGNOSIS — R05 Cough: Secondary | ICD-10-CM | POA: Insufficient documentation

## 2011-06-06 DIAGNOSIS — R0609 Other forms of dyspnea: Secondary | ICD-10-CM | POA: Insufficient documentation

## 2011-06-06 LAB — BASIC METABOLIC PANEL
BUN: 12 mg/dL (ref 6–23)
Calcium: 9.6 mg/dL (ref 8.4–10.5)
Creatinine, Ser: 0.99 mg/dL (ref 0.50–1.35)
GFR calc non Af Amer: >90 mL/min (ref >90)
Glucose, Bld: 94 mg/dL (ref 70–99)
Sodium: 137 mEq/L (ref 135–145)

## 2011-06-06 LAB — CBC
MCH: 32.2 pg (ref 26.0–34.0)
MCV: 95.9 fL (ref 78.0–100.0)
Platelets: 231 10*3/uL (ref 150–400)
RBC: 4.19 MIL/uL — ABNORMAL LOW (ref 4.22–5.81)

## 2011-06-06 LAB — DIFFERENTIAL
Eosinophils Absolute: 0.2 10*3/uL (ref 0.0–0.7)
Eosinophils Relative: 2 % (ref 0–5)
Lymphs Abs: 0.9 10*3/uL (ref 0.7–4.0)

## 2011-06-06 MED ORDER — ACETAMINOPHEN 325 MG PO TABS
650.0000 mg | ORAL_TABLET | Freq: Once | ORAL | Status: AC
  Administered 2011-06-06: 650 mg via ORAL

## 2011-06-06 MED ORDER — SODIUM CHLORIDE 0.9 % IV SOLN
INTRAVENOUS | Status: DC
  Administered 2011-06-06: 20 mL/h via INTRAVENOUS

## 2011-06-06 MED ORDER — ACETAMINOPHEN 325 MG PO TABS
ORAL_TABLET | ORAL | Status: AC
  Filled 2011-06-06: qty 2

## 2011-06-06 MED ORDER — ALBUTEROL SULFATE HFA 108 (90 BASE) MCG/ACT IN AERS: 2.0000 | INHALATION_SPRAY | RESPIRATORY_TRACT | Status: DC | PRN

## 2011-06-06 MED ORDER — IPRATROPIUM BROMIDE 0.02 % IN SOLN
0.5000 mg | Freq: Once | RESPIRATORY_TRACT | Status: AC
  Administered 2011-06-06: 0.5 mg via RESPIRATORY_TRACT
  Filled 2011-06-06: qty 2.5

## 2011-06-06 MED ORDER — HYDROMORPHONE HCL PF 1 MG/ML IJ SOLN
1.0000 mg | Freq: Once | INTRAMUSCULAR | Status: AC
  Administered 2011-06-06: 1 mg via INTRAVENOUS
  Filled 2011-06-06: qty 1

## 2011-06-06 MED ORDER — DOXYCYCLINE HYCLATE 100 MG PO CAPS: 100.0000 mg | ORAL_CAPSULE | Freq: Two times a day (BID) | ORAL | Status: AC

## 2011-06-06 MED ORDER — ONDANSETRON HCL 4 MG/2ML IJ SOLN
4.0000 mg | Freq: Once | INTRAMUSCULAR | Status: AC
  Administered 2011-06-06: 4 mg via INTRAVENOUS
  Filled 2011-06-06: qty 2

## 2011-06-06 MED ORDER — ALBUTEROL SULFATE (5 MG/ML) 0.5% IN NEBU
2.5000 mg | INHALATION_SOLUTION | Freq: Once | RESPIRATORY_TRACT | Status: AC
  Administered 2011-06-06: 5 mg via RESPIRATORY_TRACT
  Filled 2011-06-06: qty 1

## 2011-06-06 NOTE — ED Notes (Signed)
Pt given 2 sprites

## 2011-06-06 NOTE — ED Notes (Signed)
Respiratory notified of neb orders 

## 2011-06-06 NOTE — ED Provider Notes (Signed)
History     CSN: [id number] Arrival date & time: 06/06/2011  2:02 PM   First MD Initiated Contact with Patient 06/06/11 1458      Chief Complaint  Patient presents with  . Nausea    (Consider location/radiation/quality/duration/timing/severity/associated sxs/prior treatment) The history is provided by the patient.   55 year old male states that he has had a nonproductive cough for the last 2 days. Cough is associated with dyspnea and subjective fever. He has had posttussive emesis but denies actual nausea. He has pain in his chest and abdomen when he coughs rating the pain 10 out of 10 with coughing. Pain subsides to 5/10 when he is not coughing. He denies chills or sweats. He denies any sick contacts. He was seen in the emergency department yesterday with complaints of back pain and states that he had back x-ray done but no chest x-ray done. Symptoms of the neither getting better nor worse.  Past Medical History  Diagnosis Date  . Hypertension     Past Surgical History  Procedure Date  . Joint replacement     History reviewed. No pertinent family history.  History  Substance Use Topics  . Smoking status: Current Everyday Smoker  . Smokeless tobacco: Not on file  . Alcohol Use: No      Review of Systems  All other systems reviewed and are negative.    Allergies  Review of patient's allergies indicates no known allergies.  Home Medications   Current Outpatient Rx  Name Route Sig Dispense Refill  . HYDROCODONE-ACETAMINOPHEN 5-500 MG PO TABS Oral Take 1 tablet by mouth every 4 (four) hours as needed. For pain    . LISINOPRIL 10 MG PO TABS Oral Take 10 mg by mouth daily.        BP 138/67  Pulse 98  Temp(Src) 99.8 F (37.7 C) (Oral)  Resp 17  SpO2 94%  Physical Exam  Nursing note and vitals reviewed.  55 year old male who is resting comfortably and in no acute distress although obviously in pain when he coughs. Vital signs are normal. Head is normocephalic  atraumatic. PERRLA, EOMI. Oropharynx is clear. Neck is supple without adenopathy or JVD. Lungs have a prolonged exhalation phase and wheezing is noted with forced exhalation and with coughing. No overt rales or rhonchi back is nontender and there is no chest wall tenderness. Heart has regular rate rhythm without murmur. Abdomen is soft, flat, nontender without masses or hepatosplenomegaly. Extremities have full range of motion, no cyanosis or edema. Skin is warm and moist without rash. Neurologic mental status is normal, cranial nerves are intact, there no motor or sensory deficits. Psychiatric: No abnormalities of mood or affect.  ED Course  Procedures (including critical care time)  Labs Reviewed - No data to display Dg Lumbar Spine Complete  06/05/2011  *RADIOLOGY REPORT*  Clinical Data: Lower back pain; history of back surgery in 2011.  LUMBAR SPINE - COMPLETE 4+ VIEW  Comparison: CT of the abdomen and pelvis performed 02/11/2011, and lumbar spine radiographs performed 01/06/2011  Findings: There is no evidence of acute fracture or subluxation. The patient is status post lumbar spinal fusion at L4-S1; associated degenerative changes are seen. There is mild anterior wedging of vertebral body L1, stable from prior studies.  Vertebral bodies otherwise demonstrate normal height and alignment.  The visualized bowel gas pattern is unremarkable in appearance; air and stool are noted within the colon.  The sacroiliac joints are within normal limits.  The partially imaged left hip  hemiarthroplasty is grossly unremarkable in appearance.  IMPRESSION:  1.  No evidence of acute fracture or subluxation along the lumbar spine. 2.  Stable degenerative changes noted status post lumbar spinal fusion at L4-S1.  Original Report Authenticated By: Tonia Ghent, M.D.   Results for orders placed during the hospital encounter of 06/06/11  CBC      Component Value Range   WBC 11.7 (*) 4.0 - 10.5 (K/uL)   RBC 4.19 (*) 4.22 -  5.81 (MIL/uL)   Hemoglobin 13.5  13.0 - 17.0 (g/dL)   HCT 40.9  81.1 - 91.4 (%)   MCV 95.9  78.0 - 100.0 (fL)   MCH 32.2  26.0 - 34.0 (pg)   MCHC 33.6  30.0 - 36.0 (g/dL)   RDW 78.2  95.6 - 21.3 (%)   Platelets 231  150 - 400 (K/uL)  DIFFERENTIAL      Component Value Range   Neutrophils Relative 83 (*) 43 - 77 (%)   Neutro Abs 9.8 (*) 1.7 - 7.7 (K/uL)   Lymphocytes Relative 8 (*) 12 - 46 (%)   Lymphs Abs 0.9  0.7 - 4.0 (K/uL)   Monocytes Relative 7  3 - 12 (%)   Monocytes Absolute 0.8  0.1 - 1.0 (K/uL)   Eosinophils Relative 2  0 - 5 (%)   Eosinophils Absolute 0.2  0.0 - 0.7 (K/uL)   Basophils Relative 0  0 - 1 (%)   Basophils Absolute 0.0  0.0 - 0.1 (K/uL)  BASIC METABOLIC PANEL      Component Value Range   Sodium 137  135 - 145 (mEq/L)   Potassium 3.9  3.5 - 5.1 (mEq/L)   Chloride 100  96 - 112 (mEq/L)   CO2 26  19 - 32 (mEq/L)   Glucose, Bld 94  70 - 99 (mg/dL)   BUN 12  6 - 23 (mg/dL)   Creatinine, Ser 0.86  0.50 - 1.35 (mg/dL)   Calcium 9.6  8.4 - 57.8 (mg/dL)   GFR calc non Af Amer >90  >90 (mL/min)   GFR calc Af Amer >90  >90 (mL/min)   Dg Chest 2 View  06/06/2011  *RADIOLOGY REPORT*  Clinical Data: Cough and chest pain  CHEST - 2 VIEW  Comparison: 03/31/2011  Findings: The heart size appears normal.  No pleural effusion or pulmonary edema.  No airspace consolidation identified.  Review of the visualized osseous structures is negative.  IMPRESSION:  1.  No active cardiopulmonary abnormalities.  Original Report Authenticated By: Rosealee Albee, M.D.   Dg Lumbar Spine Complete  06/05/2011  *RADIOLOGY REPORT*  Clinical Data: Lower back pain; history of back surgery in 2011.  LUMBAR SPINE - COMPLETE 4+ VIEW  Comparison: CT of the abdomen and pelvis performed 02/11/2011, and lumbar spine radiographs performed 01/06/2011  Findings: There is no evidence of acute fracture or subluxation. The patient is status post lumbar spinal fusion at L4-S1; associated degenerative changes  are seen. There is mild anterior wedging of vertebral body L1, stable from prior studies.  Vertebral bodies otherwise demonstrate normal height and alignment.  The visualized bowel gas pattern is unremarkable in appearance; air and stool are noted within the colon.  The sacroiliac joints are within normal limits.  The partially imaged left hip hemiarthroplasty is grossly unremarkable in appearance.  IMPRESSION:  1.  No evidence of acute fracture or subluxation along the lumbar spine. 2.  Stable degenerative changes noted status post lumbar spinal fusion at L4-S1.  Original  Report Authenticated By: Tonia Ghent, M.D.      No diagnosis found.  He is given an albuterol nebulizer treatment with significant improvement both subjectively and objectively. He also now states that he has a boil on his left buttock which burst spontaneously this morning. This is examined and found to have minimal induration but some erythema of the skin. We placed on doxycycline to treat presumed MRSA infection.  MDM  A cough which would represent either bronchitis or pneumonia. Chest x-ray is been ordered. Albuterol nebulizer treatment has been ordered.        Dione Booze, MD 06/06/11 [phone]

## 2011-06-06 NOTE — ED Notes (Signed)
Pt seen here yesterday for N&V.  Pt states he received back xray yesterday but no chest xray and that he is not feeling better.  ems reports episode of vomiting prior to ems arrival

## 2011-07-12 ENCOUNTER — Encounter (HOSPITAL_COMMUNITY): Payer: Self-pay | Admitting: Emergency Medicine

## 2011-07-12 ENCOUNTER — Emergency Department (HOSPITAL_COMMUNITY): Payer: Medicare Other

## 2011-07-12 ENCOUNTER — Emergency Department (HOSPITAL_COMMUNITY)
Admission: EM | Admit: 2011-07-12 | Discharge: 2011-07-13 | Disposition: A | Discharge status: Home / Self Care | Payer: Medicare Other | Attending: Emergency Medicine | Admitting: Emergency Medicine

## 2011-07-12 DIAGNOSIS — Z9889 Other specified postprocedural states: Secondary | ICD-10-CM | POA: Insufficient documentation

## 2011-07-12 DIAGNOSIS — W1800XA Striking against unspecified object with subsequent fall, initial encounter: Secondary | ICD-10-CM

## 2011-07-12 DIAGNOSIS — Z79899 Other long term (current) drug therapy: Secondary | ICD-10-CM | POA: Insufficient documentation

## 2011-07-12 DIAGNOSIS — M549 Dorsalgia, unspecified: Secondary | ICD-10-CM | POA: Insufficient documentation

## 2011-07-12 DIAGNOSIS — Z981 Arthrodesis status: Secondary | ICD-10-CM | POA: Insufficient documentation

## 2011-07-12 DIAGNOSIS — W010XXA Fall on same level from slipping, tripping and stumbling without subsequent striking against object, initial encounter: Secondary | ICD-10-CM | POA: Insufficient documentation

## 2011-07-12 DIAGNOSIS — F17200 Nicotine dependence, unspecified, uncomplicated: Secondary | ICD-10-CM | POA: Insufficient documentation

## 2011-07-12 DIAGNOSIS — I10 Essential (primary) hypertension: Secondary | ICD-10-CM | POA: Insufficient documentation

## 2011-07-12 DIAGNOSIS — Y93E1 Activity, personal bathing and showering: Secondary | ICD-10-CM | POA: Insufficient documentation

## 2011-07-12 MED ORDER — HYDROCODONE-ACETAMINOPHEN 5-325 MG PO TABS
1.0000 | ORAL_TABLET | Freq: Once | ORAL | Status: AC
  Administered 2011-07-12: 1 via ORAL
  Filled 2011-07-12: qty 1

## 2011-07-12 NOTE — ED Notes (Signed)
Patient fell getting out of bathtub this evening.  Patient having tenderness in left lower back, muscle feeling tense.  Patient denies hitting head.  Patient is CAOx3.

## 2011-07-12 NOTE — ED Notes (Signed)
PT. SLIPPED AND FELL WHILE TAKING A SHOWER TODAY , NO LOC , AMBULATORY , REPORTS MID BACK PAIN .

## 2011-07-13 NOTE — ED Provider Notes (Signed)
History     CSN: [id number]  Arrival date & time 07/12/11  2031   First MD Initiated Contact with Patient 07/12/11 2331      Chief Complaint  Patient presents with  . Fall    (Consider location/radiation/quality/duration/timing/severity/associated sxs/prior treatment) HPI Comments: Patient slipped getting out of the shower landing on the top hitting his lower spine.  Recent surgeries.  Concerned that the hardware is intact and normally takes Vicodin for pain.  He did not take 1 prior to arrival  Patient is a 56 y.o. male presenting with fall. The history is provided by the patient.  Fall He fell from a height of 1 to 2 ft. He landed on a hard floor. The pain is at a severity of 3/10. The pain is mild. He was ambulatory at the scene. There was drug use involved in the accident. Pertinent negatives include no numbness, no bowel incontinence and no tingling. He has tried nothing for the symptoms.    Past Medical History  Diagnosis Date  . Hypertension     Past Surgical History  Procedure Date  . Joint replacement   . Back surgery   . Total hip arthroplasty     No family history on file.  History  Substance Use Topics  . Smoking status: Current Everyday Smoker  . Smokeless tobacco: Not on file  . Alcohol Use: No      Review of Systems  Constitutional: Negative for activity change.  Eyes: Negative for visual disturbance.  Respiratory: Negative for shortness of breath.   Cardiovascular: Negative for chest pain and leg swelling.  Gastrointestinal: Negative for bowel incontinence.  Musculoskeletal: Positive for back pain.  Skin: Negative.   Neurological: Negative for dizziness, tingling and numbness.    Allergies  Review of patient's allergies indicates no known allergies.  Home Medications   Current Outpatient Rx  Name Route Sig Dispense Refill  . ALBUTEROL SULFATE HFA 108 (90 BASE) MCG/ACT IN AERS Inhalation Inhale 2 puffs into the lungs every 4 (four) hours as  needed. For wheezing     . HYDROCODONE-ACETAMINOPHEN 5-500 MG PO TABS Oral Take 1 tablet by mouth every 4 (four) hours as needed. For pain    . LISINOPRIL 10 MG PO TABS Oral Take 10 mg by mouth daily.        BP 134/79  Pulse 65  Temp(Src) 98.6 F (37 C) (Oral)  Resp 17  SpO2 92%  Physical Exam  Constitutional: He is oriented to person, place, and time. He appears well-developed.  HENT:  Head: Normocephalic.  Eyes: Pupils are equal, round, and reactive to light.  Neck: Normal range of motion.  Cardiovascular: Normal rate.   Pulmonary/Chest: He is in respiratory distress.  Musculoskeletal: He exhibits no edema and no tenderness.       Well-healed surgical wound, lumbar spine.  No tenderness  Neurological: He is oriented to person, place, and time.  Skin: Skin is warm.    ED Course  Procedures (including critical care time)  Labs Reviewed - No data to display Dg Thoracic Spine 2 View  07/12/2011  *RADIOLOGY REPORT*  Clinical Data: Fall last week.  Back pain.  THORACIC SPINE - 2 VIEW  Comparison: 06/06/2011.Thoracic spine radiographs 08/25/2009.  Findings: Thoracic spine DISH is present. Vertebral body height is preserved.  Cervicothoracic junction is obscured by overlying osseous structures on the attempted swimmer's view.  The upper thoracic spine appears normal on the standard views.  No compression fracture is identified.  Intervertebral  disc space is preserved.  Compared to the chest film 06/06/2011, no interval change.  IMPRESSION: Thoracic spine DISH.  No acute osseous abnormality.  Original Report Authenticated By: Andreas Newport, M.D.   Dg Lumbar Spine 2-3 Views  07/13/2011  *RADIOLOGY REPORT*  Clinical Data: Fall.  Hardware.  History of spinal fusion.  LUMBAR SPINE - 2-3 VIEW  Comparison: 06/05/2011.  Findings: Posterior lumbar interbody fusion is present from L4-S1. Partial visualization of a left hip arthroplasty.  No hardware fracture or disconnection.  Alignment unchanged  with grade 1 anterolisthesis of L4 on L5.  L4-L5 and L5-S1 discectomy with interbody bone graft.  Vertebral body height at other levels is preserved.  Mild degenerative disease in the upper lumbar spine.  IMPRESSION: No interval change or hardware complication.  L4-L5 and L5-S1 PLIF.  Original Report Authenticated By: Andreas Newport, M.D.     1. Fall against object       MDM  Will obtain lumbar 2 view x-ray to reassure patient up for replacement        Arman Filter, NP 07/13/11 0006  Arman Filter, NP 07/13/11 0232  Arman Filter, NP 07/13/11 [phone]

## 2011-07-13 NOTE — ED Notes (Addendum)
See downtime charting for after 00:15 charting. Pt discharged at 00:45 note placed when computers came back up at 02:20

## 2011-07-13 NOTE — ED Provider Notes (Signed)
Medical screening examination/treatment/procedure(s) were performed by non-physician practitioner and as supervising physician I was immediately available for consultation/collaboration.   Kham Zuckerman M Sirenia Whitis, MD 07/13/11 0643 

## 2011-07-15 ENCOUNTER — Emergency Department (HOSPITAL_COMMUNITY)
Admission: EM | Admit: 2011-07-15 | Discharge: 2011-07-15 | Disposition: A | Discharge status: Home / Self Care | Payer: Medicare Other | Attending: Emergency Medicine | Admitting: Emergency Medicine

## 2011-07-15 DIAGNOSIS — S335XXA Sprain of ligaments of lumbar spine, initial encounter: Secondary | ICD-10-CM | POA: Insufficient documentation

## 2011-07-15 DIAGNOSIS — I10 Essential (primary) hypertension: Secondary | ICD-10-CM | POA: Insufficient documentation

## 2011-07-15 DIAGNOSIS — M549 Dorsalgia, unspecified: Secondary | ICD-10-CM | POA: Insufficient documentation

## 2011-07-15 DIAGNOSIS — S39012A Strain of muscle, fascia and tendon of lower back, initial encounter: Secondary | ICD-10-CM

## 2011-07-15 MED ORDER — DIAZEPAM 5 MG PO TABS
5.0000 mg | ORAL_TABLET | Freq: Once | ORAL | Status: AC
  Administered 2011-07-15: 5 mg via ORAL
  Filled 2011-07-15: qty 1

## 2011-07-15 MED ORDER — KETOROLAC TROMETHAMINE 60 MG/2ML IM SOLN
60.0000 mg | Freq: Once | INTRAMUSCULAR | Status: AC
  Administered 2011-07-15: 60 mg via INTRAMUSCULAR
  Filled 2011-07-15: qty 2

## 2011-07-15 MED ORDER — IBUPROFEN 800 MG PO TABS: 800.0000 mg | ORAL_TABLET | Freq: Three times a day (TID) | ORAL | Status: AC

## 2011-07-15 MED ORDER — DIAZEPAM 5 MG PO TABS: 5.0000 mg | ORAL_TABLET | Freq: Three times a day (TID) | ORAL | Status: AC | PRN

## 2011-07-15 NOTE — ED Notes (Signed)
MD at bedside for exam

## 2011-07-15 NOTE — ED Provider Notes (Signed)
Medical screening examination/treatment/procedure(s) were performed by non-physician practitioner and as supervising physician I was immediately available for consultation/collaboration.  Doug Sou, MD 07/15/11 2149
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
## 2011-08-15 ENCOUNTER — Encounter (HOSPITAL_COMMUNITY): Payer: Self-pay

## 2011-08-15 ENCOUNTER — Emergency Department (HOSPITAL_COMMUNITY)
Admission: EM | Admit: 2011-08-15 | Discharge: 2011-08-15 | Disposition: A | Discharge status: Home / Self Care | Payer: Medicare Other | Attending: Emergency Medicine | Admitting: Emergency Medicine

## 2011-08-15 DIAGNOSIS — Z79899 Other long term (current) drug therapy: Secondary | ICD-10-CM | POA: Insufficient documentation

## 2011-08-15 DIAGNOSIS — I10 Essential (primary) hypertension: Secondary | ICD-10-CM | POA: Insufficient documentation

## 2011-08-15 DIAGNOSIS — B079 Viral wart, unspecified: Secondary | ICD-10-CM | POA: Insufficient documentation

## 2011-08-15 DIAGNOSIS — M79609 Pain in unspecified limb: Secondary | ICD-10-CM | POA: Insufficient documentation

## 2011-08-15 NOTE — ED Notes (Signed)
Patient D/c instructions reviewed. Pt ambulatory to D/c window. VSS.

## 2011-08-15 NOTE — ED Notes (Signed)
Pt here for hand pain, has a sore on palm of right hand and sts it was removed here about a year ago and has since come back and is painful.

## 2011-08-15 NOTE — ED Provider Notes (Signed)
History     CSN: [id number]  Arrival date & time 08/15/11  0750   First MD Initiated Contact with Patient 08/15/11 [phone]      Chief Complaint  Patient presents with  . Hand Injury    (Consider location/radiation/quality/duration/timing/severity/associated sxs/prior treatment) Patient is a 56 y.o. male presenting with hand pain. The history is provided by the patient.  Hand Pain This is a new problem. The current episode started in the past 7 days.  Pt states he has a growth on his right hand. States it started several days ago. Painful. Hx of the same, stats had it cut out about a year ago, and it returned. Denies injury. Denies fever, chills, malaise. No redness or drainage from the growth.  Past Medical History  Diagnosis Date  . Hypertension     Past Surgical History  Procedure Date  . Joint replacement   . Back surgery   . Total hip arthroplasty     History reviewed. No pertinent family history.  History  Substance Use Topics  . Smoking status: Current Everyday Smoker  . Smokeless tobacco: Not on file  . Alcohol Use: No      Review of Systems  Skin: Positive for wound.  All other systems reviewed and are negative.    Allergies  Review of patient's allergies indicates no known allergies.  Home Medications   Current Outpatient Rx  Name Route Sig Dispense Refill  . ALBUTEROL SULFATE HFA 108 (90 BASE) MCG/ACT IN AERS Inhalation Inhale 2 puffs into the lungs every 4 (four) hours as needed. For wheezing     . HYDROCODONE-ACETAMINOPHEN 5-500 MG PO TABS Oral Take 1 tablet by mouth every 4 (four) hours as needed. For pain    . LISINOPRIL 10 MG PO TABS Oral Take 10 mg by mouth daily.        BP 158/92  Pulse 76  Temp(Src) 97.6 F (36.4 C) (Oral)  Resp 18  SpO2 96%  Physical Exam  Nursing note and vitals reviewed. Constitutional: He is oriented to person, place, and time. He appears well-developed and well-nourished.  HENT:  Head: Normocephalic.  Eyes:  Conjunctivae are normal.  Neck: Neck supple.  Cardiovascular: Normal rate, regular rhythm and normal heart sounds.   Pulmonary/Chest: Effort normal and breath sounds normal. No respiratory distress.  Musculoskeletal: Normal range of motion.       Full rom of all fingers of right hand  Neurological: He is alert and oriented to person, place, and time.  Skin: Skin is warm and dry.       There a palmar wart to the right hand, it measures <1cm, tender. No erythema, no drainage.   Psychiatric: He has a normal mood and affect.    ED Course  Procedures (including critical care time)  Exam consistent with a plantar/palmar wart. Will recommend over the counter medications and follow up  No diagnosis found.    MDM         Lottie Mussel, PA 08/15/11 [phone]

## 2011-08-16 NOTE — ED Provider Notes (Signed)
Medical screening examination/treatment/procedure(s) were performed by non-physician practitioner and as supervising physician I was immediately available for consultation/collaboration.   Dione Booze, MD 08/16/11 [phone]

## 2011-10-24 ENCOUNTER — Other Ambulatory Visit: Payer: Self-pay

## 2011-10-24 ENCOUNTER — Observation Stay (HOSPITAL_COMMUNITY)
Admission: EM | Admit: 2011-10-24 | Discharge: 2011-10-25 | Disposition: A | Discharge status: Home / Self Care | Payer: Medicare Other | Attending: Emergency Medicine | Admitting: Emergency Medicine

## 2011-10-24 ENCOUNTER — Encounter (HOSPITAL_COMMUNITY): Payer: Self-pay | Admitting: *Deleted

## 2011-10-24 ENCOUNTER — Emergency Department (HOSPITAL_COMMUNITY): Payer: Medicare Other

## 2011-10-24 DIAGNOSIS — R0602 Shortness of breath: Secondary | ICD-10-CM | POA: Insufficient documentation

## 2011-10-24 DIAGNOSIS — R079 Chest pain, unspecified: Principal | ICD-10-CM | POA: Insufficient documentation

## 2011-10-24 DIAGNOSIS — F17200 Nicotine dependence, unspecified, uncomplicated: Secondary | ICD-10-CM | POA: Insufficient documentation

## 2011-10-24 DIAGNOSIS — I10 Essential (primary) hypertension: Secondary | ICD-10-CM | POA: Insufficient documentation

## 2011-10-24 DIAGNOSIS — G4730 Sleep apnea, unspecified: Secondary | ICD-10-CM

## 2011-10-24 DIAGNOSIS — Z8249 Family history of ischemic heart disease and other diseases of the circulatory system: Secondary | ICD-10-CM | POA: Insufficient documentation

## 2011-10-24 LAB — RAPID URINE DRUG SCREEN, HOSP PERFORMED
Amphetamines: NOT DETECTED
Barbiturates: NOT DETECTED
Cocaine: NOT DETECTED
Tetrahydrocannabinol: NOT DETECTED

## 2011-10-24 LAB — POCT I-STAT, CHEM 8
BUN: 16 mg/dL (ref 6–23)
Chloride: 105 mEq/L (ref 96–112)
Creatinine, Ser: 1 mg/dL (ref 0.50–1.35)
Potassium: 3.6 mEq/L (ref 3.5–5.1)
Sodium: 143 mEq/L (ref 135–145)

## 2011-10-24 LAB — POCT I-STAT TROPONIN I

## 2011-10-24 LAB — COMPREHENSIVE METABOLIC PANEL
ALT: 29 U/L (ref 0–53)
AST: 32 U/L (ref 0–37)
CO2: 27 mEq/L (ref 19–32)
Chloride: 103 mEq/L (ref 96–112)
GFR calc non Af Amer: >90 mL/min (ref >90)
Sodium: 139 mEq/L (ref 135–145)
Total Bilirubin: 0.5 mg/dL (ref 0.3–1.2)

## 2011-10-24 MED ORDER — ASPIRIN 81 MG PO CHEW
324.0000 mg | CHEWABLE_TABLET | Freq: Once | ORAL | Status: AC
  Administered 2011-10-24: 324 mg via ORAL
  Filled 2011-10-24: qty 4

## 2011-10-24 MED ORDER — SODIUM CHLORIDE 0.9 % IV SOLN
20.0000 mL | INTRAVENOUS | Status: DC
  Administered 2011-10-24: 22:00:00 via INTRAVENOUS

## 2011-10-24 MED ORDER — NITROGLYCERIN 2 % TD OINT
TOPICAL_OINTMENT | TRANSDERMAL | Status: AC
  Administered 2011-10-24: 1 [in_us]
  Filled 2011-10-24: qty 1

## 2011-10-24 NOTE — ED Notes (Signed)
To ED for eval of midsternal cp. Started 2 hrs ago. Describes pain as sharp and stinging.

## 2011-10-24 NOTE — ED Provider Notes (Signed)
History     CSN: [id number]  Arrival date & time 10/24/11  1818   First MD Initiated Contact with Patient 10/24/11 2018      Chief Complaint  Patient presents with  . Chest Pain    (Consider location/radiation/quality/duration/timing/severity/associated sxs/prior treatment) HPI Comments: Patient presents with substernal chest pain it does not radiate there has been coming and going intermittently since last night. He states the pain comes on without warning last 15-20 minutes at a time then subsides. This multiple times throughout the day. He is pain free at this time. He denies any history of cardiac problems and has never had a stress test. He also endorses shortness of breath and nausea with this pain. He is in no pain at this time. He denies any leg pain or swelling or recent travel. He is a smoker and has a history of high blood pressure. Is a family history of heart disease but does not know the details.  The history is provided by the patient.    Past Medical History  Diagnosis Date  . Hypertension     Past Surgical History  Procedure Date  . Joint replacement   . Back surgery   . Total hip arthroplasty     History reviewed. No pertinent family history.  History  Substance Use Topics  . Smoking status: Current Everyday Smoker  . Smokeless tobacco: Not on file  . Alcohol Use: No      Review of Systems  Constitutional: Negative for fever, activity change and appetite change.  HENT: Negative for congestion and rhinorrhea.   Eyes: Negative for visual disturbance.  Respiratory: Positive for chest tightness and shortness of breath.   Cardiovascular: Positive for chest pain.  Gastrointestinal: Positive for nausea. Negative for vomiting and abdominal pain.  Genitourinary: Negative for dysuria and hematuria.  Musculoskeletal: Negative for back pain.  Skin: Negative for rash.  Neurological: Negative for dizziness and headaches.    Allergies  Review of patient's  allergies indicates no known allergies.  Home Medications   Current Outpatient Rx  Name Route Sig Dispense Refill  . ALBUTEROL SULFATE HFA 108 (90 BASE) MCG/ACT IN AERS Inhalation Inhale 2 puffs into the lungs every 4 (four) hours as needed. For wheezing     . HYDROCODONE-ACETAMINOPHEN 5-500 MG PO TABS Oral Take 1 tablet by mouth every 4 (four) hours as needed. For pain    . LISINOPRIL 10 MG PO TABS Oral Take 10 mg by mouth daily.        BP 118/61  Pulse 79  Temp(Src) 97.8 F (36.6 C) (Oral)  Resp 16  SpO2 96%  Physical Exam  Constitutional: He is oriented to person, place, and time. He appears well-developed and well-nourished. No distress.  HENT:  Head: Normocephalic and atraumatic.  Mouth/Throat: Oropharynx is clear and moist. No oropharyngeal exudate.  Eyes: Conjunctivae are normal. Pupils are equal, round, and reactive to light.  Neck: Normal range of motion. Neck supple.  Cardiovascular: Normal rate, regular rhythm and normal heart sounds.   Pulmonary/Chest: Effort normal and breath sounds normal. No respiratory distress.  Abdominal: Soft. There is no tenderness. There is no rebound and no guarding.  Musculoskeletal: Normal range of motion. He exhibits no edema and no tenderness.  Neurological: He is alert and oriented to person, place, and time. No cranial nerve deficit.  Skin: Skin is warm.    ED Course  Procedures (including critical care time)  Labs Reviewed  COMPREHENSIVE METABOLIC PANEL - Abnormal; Notable for  the following:    Glucose, Bld 100 (*)    All other components within normal limits  POCT I-STAT, CHEM 8  POCT I-STAT TROPONIN I  LIPASE, BLOOD  URINE RAPID DRUG SCREEN (HOSP PERFORMED)  POCT I-STAT TROPONIN I  POCT I-STAT TROPONIN I   Dg Chest 2 View  10/24/2011  *RADIOLOGY REPORT*  Clinical Data: Chest pain.  History of smoking.  CHEST - 2 VIEW  Comparison: Multiple priors, most recently chest x-ray 06/06/2011.  Findings: Lung volumes are normal.   No consolidative airspace disease.  No pleural effusions.  No pneumothorax.  No pulmonary nodule or mass noted.  Pulmonary vasculature and the cardiomediastinal silhouette are within normal limits.  IMPRESSION: 1. No radiographic evidence of acute cardiopulmonary disease.  Original Report Authenticated By: Florencia Reasons, M.D.     No diagnosis found.    MDM  Intermittent substernal chest pain with typical and atypical features. Patient has history of hyperlipidemia, hypertension, smoker. EKG with no ischemic changes.  Troponin negative. No tachycardia or hypoxia to suggest PE.  Intermittent substernal chest pain with both typical and atypical features.  With risk factors of HTN, tobacco, abuse, family history, obesity, will observer overnight for stress test.  PAtient in agreement.  D/w NP Katrinka Blazing.   Date: 10/24/2011  Rate: 65  Rhythm: normal sinus rhythm  QRS Axis: normal  Intervals: normal  ST/T Wave abnormalities: normal  Conduction Disutrbances:none  Narrative Interpretation:   Old EKG Reviewed: unchanged         Glynn Octave, MD 10/25/11 [phone]

## 2011-10-24 NOTE — ED Notes (Signed)
RETURNED FROM X-RAY , NO PAIN AT THIS TIME , RESPIRATIONS UNLABORED , IV SITE UNREMARKABLE.

## 2011-10-24 NOTE — ED Notes (Signed)
ekg shot 2x.  Patient moved during first one.

## 2011-10-25 DIAGNOSIS — R079 Chest pain, unspecified: Secondary | ICD-10-CM

## 2011-10-25 LAB — POCT I-STAT TROPONIN I
Troponin i, poc: 0 ng/mL (ref 0.00–0.08)
Troponin i, poc: 0.01 ng/mL (ref 0.00–0.08)

## 2011-10-25 MED ORDER — ALBUTEROL SULFATE (5 MG/ML) 0.5% IN NEBU
5.0000 mg | INHALATION_SOLUTION | Freq: Once | RESPIRATORY_TRACT | Status: AC
  Administered 2011-10-25: 5 mg via RESPIRATORY_TRACT
  Filled 2011-10-25: qty 1

## 2011-10-25 NOTE — Discharge Instructions (Signed)
Read instructions below for reasons to return to the Emergency Department. It is recommended that your follow up with your Primary Care Doctor in regards to today's visit. If you do not have a doctor, use the resource guide listed below to help you find one.  It is recommended that you discuss your high blood pressure and possible sleep apnea with a primary care physician.  Read instructions on reasons to return to the emergency department.  Chest Pain (Nonspecific)  HOME CARE INSTRUCTIONS  For the next few days, avoid physical activities that bring on chest pain. Continue physical activities as directed.  Do not smoke cigarettes or drink alcohol until your symptoms are gone.  Only take over-the-counter or prescription medicine for pain, discomfort, or fever as directed by your caregiver.  Follow your caregiver's suggestions for further testing if your chest pain does not go away.  Keep any follow-up appointments you made. If you do not go to an appointment, you could develop lasting (chronic) problems with pain. If there is any problem keeping an appointment, you must call to reschedule.  SEEK MEDICAL CARE IF:  You think you are having problems from the medicine you are taking. Read your medicine instructions carefully.  Your chest pain does not go away, even after treatment.  You develop a rash with blisters on your chest.  SEEK IMMEDIATE MEDICAL CARE IF:  You have increased chest pain or pain that spreads to your arm, neck, jaw, back, or belly (abdomen).  You develop shortness of breath, an increasing cough, or you are coughing up blood.  You have severe back or abdominal pain, feel sick to your stomach (nauseous) or throw up (vomit).  You develop severe weakness, fainting, or chills.  You have an oral temperature above 102 F (38.9 C), not controlled by medicine.   THIS IS AN EMERGENCY. Do not wait to see if the pain will go away. Get medical help at once. Call your local emergency services  (911 in [place] Do not drive yourself to the hospital.  Sleep Apnea  Sleep apnea is a common disorder. The main problem of this disorder is excessive daytime sleepiness and compromised quality of life. This may include social and emotional problems. There are two types of sleep apnea.  Obstructive sleep apnea is when breathing stops due to a blocked airway.  Central sleep apnea is a malfunction of the brain's normal signal to breathe.  SYMPTOMS  Restless sleep.  Falling asleep while driving and/or during the day.  Loss of energy.  Irritability.  Mood or behavior changes.  Loud, heavy snoring.  Morning headaches.  Trouble concentrating.  Forgetfulness.  Anxiety or depression.  Decreased interest in sex.  Not all people with sleep apnea have all of these symptoms. However, people who have a few of these symptoms should visit their caregiver for an evaluation. Problems related to untreated sleep apnea include:  High blood pressure (hypertension).  Coronary artery disease.  Impotence.  Cognitive dysfunction.  Memory loss.  TREATMENT  For mild cases, treatment may include avoiding sleeping on one's back.  For people with nasal congestion, a decongestant may be prescribed.  Patients with obstructive and central apnea should avoid depressants. This includes alcohol, sedatives and narcotics. Weight loss and diet control are encouraged for overweight patients.  Many serious cases of obstructive sleep apnea can be relieved by a treatment called nasal continuous positive airway pressure (nasal CPAP). Nasal CPAP uses a mask-like device and pump that work together to keep the  airway open. The pump delivers air pressure during each breath.  Surgery may help some patients by stopping or reducing the narrowing of the airway due to anatomical defects.  PROGNOSIS  Removing the obstruction usually reverses hypertension and cardiac problems. Untreated, sleep apnea sufferers have a tendency to fall asleep  during the day. This is can result in serious accident or loss of ones job.  RESOURCE GUIDE  Dental Problems  Patients with Medicaid: [facility] [phone]                                           [phone] [address] Phone:  [phone]                                                  Phone:  [phone]  If unable to pay or uninsured, contact:  [facility] or [facility]. to become qualified for the adult dental clinic.  Chronic Pain Problems Contact [facility]  [phone] Patients need to be referred by their primary care doctor.  Insufficient Money for Medicine Contact [employer]:  call "211" or [facility] [phone].  No Primary Care Doctor Call [facility]  [phone] Other agencies that provide inexpensive medical care    [facility]  [phone]    [facility]  [phone]    [facility]  6201969437    [facility]  [phone]    [facility]  [phone]    [facility]  [phone]  Psychological Services [facility]  [phone] [facility]  [phone] [facility]   [phone] (emergency services [phone])  Substance Abuse Resources [facility]  640-583-8137 [facility] [phone] [facility] 7721068871 Floydene Flock [phone] Residential & Outpatient Substance Abuse Program  [phone]  Abuse/Neglect [facility] Child Abuse Hotline 859-410-3535 [facility] Child Abuse Hotline 9171031517 (After Hours)  Emergency Shelter [facility] [phone]  Maternity Homes [facility] [place] [facility] [phone] [facility] [phone]  MRSA Hotline #:   740-472-0807    [facility] Resources  [facility]     [employer]                          [facility]. [address] [place]                       [address]      [address]  Patrecia Pace  [facility] Phone:  [phone]                                   Phone:  [phone]                 Phone:  [phone]  [facility] Mental Health Phone:  [phone]  [facility] Child Abuse Hotline [phone] 705-252-3462 (After Hours)

## 2011-10-25 NOTE — ED Notes (Signed)
Meal tray provided prior to discharge.

## 2011-10-25 NOTE — ED Notes (Addendum)
Pt rounded on and deeply sleeping with multiple episodes of apnea on arrival to shift. Pt dropping sats to 68% with good wave form. Received report of same from [place], [place]. Cardiac monitor alarming multiple times of same. Pt returns to baseline saturation of 98% with coaching. Dr. Hyacinth Meeker made aware. Per doctor Hyacinth Meeker, "we all do that when we sleep." Clarified with Dr. Hyacinth Meeker that we did not need to upgrade patient status from CDU to inpatient and Dr. Hyacinth Meeker confirmed no. "This is the first time I'm hearing of this." Repeated to Dr. Hyacinth Meeker that the desaturation alarms continue to alarm despite repositioning while patient sleeps. No new orders received.

## 2011-10-25 NOTE — ED Notes (Signed)
Jaci Carrel, PA-C notified patient continues to having frequent episodes of apnea, approx. every with desaturation to 68%. O2 sats spontaneously return to baseline while awake. Jaci Carrel, Pa-c at bedside to assess patient. Pa states she will provide followup for patient at discharge. No new orders.

## 2011-10-25 NOTE — ED Provider Notes (Signed)
7:00 AM Patient moved to CDU under chest pain protocol from Pod A, Dr. Hyacinth Meeker. Patient resting comfortably at present without return of chest pain. Lungs CTA bilaterally. S1/S2, RRR, no murmur. Abdomen soft, bowel sounds present. Strong distal pulses palpated all extremities. Sinus rhythm on monitor without ectopy. CXR WNL, troponin negative x 2., 12 lead reviewed, no indication of ischemia. Patient scheduled for stress echo in AM. Diagnostic and treatment plan discussed with patient. Dr. Hyacinth Meeker and myself have been notified by nurse that patient's O2 saturation drops while sleeping, will recomend f-u sleep apnea study at dc.  9:15 AM Pt sleeping comfortably in NAD  10:35 AM Pt without new complaints,VSS, LCAB, RRR. Stress echo pending  11:50 AM Pt wheezing during stress test, nebulizer ordered, VSS, results pending   1:05 PM Stress echo Study Conclusions - Procedure narrative: THR was achieved. There was baseline hypertension. - Stress ECG conclusions: Sinus tachycardia. No exercise induced ischemic EKG changes noted. - Baseline: LVEF ~55%. No regional wall motion abnormalities at rest. Mild concentric LVH. - Peak stress: Expected increase in LVEF to 75-80% with mid-systolic obliteration of the LV cavity. No wall motion abnormalities were noted. - Impressions: No electrocardiographic or wall motion evidence for exercise induced ischemia. Impressions: - No electrocardiographic or wall motion evidence for exercise induced ischemia.  Patient is to be discharged with recommendation to follow up with PCP in regards to today's hospital visit, to discuss treatment for his hypertension, and to arrange for a sleep study. Chest pain is not likely of cardiac or pulmonary etiology d/t presentation, perc negative, VSS, no tracheal deviation, no JVD or new murmur, RRR, breath sounds equal bilaterally, EKG without acute abnormalities, negative troponin, and negative CXR, stress echo WNL. Pt has been  advised to return to the ED is CP becomes exertional, associated with diaphoresis or nausea, radiates to left jaw/arm, worsens or becomes concerning in any way. Pt appears reliable for follow up and is agreeable to discharge.   Case has been discussed with and seen by Dr. Radford Pax who agrees with the above plan to discharge.   #1 CP #2 Hypertension #3 ?sleep apnea   Jaci Carrel, [place] 10/25/11 1309

## 2011-10-25 NOTE — ED Notes (Signed)
NITROPASTE DISCONTINUED.

## 2011-11-16 ENCOUNTER — Institutional Professional Consult (permissible substitution): Payer: Medicare Other | Admitting: Pulmonary Disease

## 2011-12-09 ENCOUNTER — Ambulatory Visit (INDEPENDENT_AMBULATORY_CARE_PROVIDER_SITE_OTHER): Payer: Medicare Other | Admitting: Pulmonary Disease

## 2011-12-09 ENCOUNTER — Encounter: Payer: Self-pay | Admitting: Pulmonary Disease

## 2011-12-09 VITALS — BP 118/68 | HR 69 | Temp 97.9°F | Ht 68.0 in | Wt 266.4 lb

## 2011-12-09 DIAGNOSIS — F17200 Nicotine dependence, unspecified, uncomplicated: Secondary | ICD-10-CM

## 2011-12-09 DIAGNOSIS — Z720 Tobacco use: Secondary | ICD-10-CM | POA: Insufficient documentation

## 2011-12-09 DIAGNOSIS — J42 Unspecified chronic bronchitis: Secondary | ICD-10-CM | POA: Insufficient documentation

## 2011-12-09 DIAGNOSIS — G4733 Obstructive sleep apnea (adult) (pediatric): Secondary | ICD-10-CM | POA: Insufficient documentation

## 2011-12-09 DIAGNOSIS — R0989 Other specified symptoms and signs involving the circulatory and respiratory systems: Secondary | ICD-10-CM

## 2011-12-09 DIAGNOSIS — R0683 Snoring: Secondary | ICD-10-CM

## 2011-12-09 HISTORY — DX: Snoring: R06.83

## 2011-12-09 MED ORDER — IPRATROPIUM-ALBUTEROL 20-100 MCG/ACT IN AERS: 1.0000 | INHALATION_SPRAY | Freq: Four times a day (QID) | RESPIRATORY_TRACT | Status: DC

## 2011-12-09 NOTE — Progress Notes (Deleted)
  Subjective:    Patient ID: Peter Manning, male    DOB: 01-04-1956, 56 y.o.   MRN: [id number]  HPI    Review of Systems  Constitutional: Negative for fever, appetite change and unexpected weight change.  HENT: Positive for trouble swallowing. Negative for ear pain, congestion, sore throat and dental problem.   Respiratory: Positive for cough and shortness of breath.   Cardiovascular: Positive for chest pain and leg swelling. Negative for palpitations.  Gastrointestinal: Positive for abdominal pain.  Musculoskeletal: Positive for joint swelling and arthralgias.  Skin: Positive for rash.  Neurological: Positive for headaches.  Psychiatric/Behavioral: Positive for dysphoric mood. The patient is not nervous/anxious.        Objective:   Physical Exam        Assessment & Plan:

## 2011-12-09 NOTE — Assessment & Plan Note (Signed)
To further assess for obstructive lung disease as a contributor to nocturnal breathing difficulties will give him trial of combivent and arrange for pulmonary function testing.

## 2011-12-09 NOTE — Progress Notes (Signed)
Chief Complaint  Patient presents with  . Sleep Consult    refer Dr. Roseanne Reno. pt stops breathing at night, snores, feels tired during the day    History of Present Illness: Peter Manning is a 56 y.o. male smoker for evaluation of sleep apnea and dyspnea.  He was seen in the ER recently for chest pain and dyspnea.  He was told he could have sleep apnea.  He was referred to pulmonary for further evaluation of this.  He also has difficulty with his breathing, and would like more evaluation of this also.  He smokes when he can get cigarettes.  He has chronic cough productive of clear to yellow sputum.  He denies hemoptysis.  He gets intermittent wheezing.  He used his grand-daughters nebulizer, and this helped a lot.  He is not using any inhalers at present.  He has not had breathing tests before.  There is no history of pneumonia or TB.  He used to work at [employer].  He was told he has asthma, but denies allergies.  He is from [place].  He denies sick exposures.  He snores, and wakes up feeling like he can't catch his breath.  He has been told he stops breathing while asleep.  He is not using anything to help him sleep or stay awake.  He falls asleep easily when sitting quiet.  His Epworth score is 13 out of 24.  The patient denies sleep walking, sleep talking, bruxism, or nightmares.  There is no history of restless legs.  The patient denies sleep hallucinations, sleep paralysis, or cataplexy.   Past Medical History  Diagnosis Date  . Hypertension   . Hyperlipidemia     Past Surgical History  Procedure Date  . Joint replacement   . Back surgery   . Total hip arthroplasty     Current Outpatient Prescriptions on File Prior to Visit  Medication Sig Dispense Refill  . albuterol (PROVENTIL HFA;VENTOLIN HFA) 108 (90 BASE) MCG/ACT inhaler Inhale 2 puffs into the lungs every 4 (four) hours as needed. For wheezing       . HYDROcodone-acetaminophen (VICODIN) 5-500 MG per tablet Take 1  tablet by mouth every 4 (four) hours as needed. For pain      . lisinopril (PRINIVIL,ZESTRIL) 10 MG tablet Take 10 mg by mouth daily.        . Ipratropium-Albuterol (COMBIVENT RESPIMAT) 20-100 MCG/ACT AERS respimat Inhale 1 puff into the lungs every 6 (six) hours.  1 Inhaler  5    No Known Allergies  Family History  Problem Relation Age of Onset  . Heart attack Father   . Cancer Mother     History  Substance Use Topics  . Smoking status: Current Everyday Smoker -- 39 years  . Smokeless tobacco: Not on file   Comment: 5-6 cigs a day  . Alcohol Use: No    Review of Systems  Constitutional: Negative for fever, appetite change and unexpected weight change.  HENT: Positive for trouble swallowing. Negative for ear pain, congestion, sore throat and dental problem.   Respiratory: Positive for cough and shortness of breath.   Cardiovascular: Positive for chest pain and leg swelling. Negative for palpitations.  Gastrointestinal: Positive for abdominal pain.  Musculoskeletal: Positive for joint swelling and arthralgias.  Skin: Positive for rash.  Neurological: Positive for headaches.  Psychiatric/Behavioral: Positive for dysphoric mood. The patient is not nervous/anxious.     Physical Exam: There were no vitals filed for this visit.,  Wt Readings from Last 3 Encounters:  No data found for Wt   There is no height or weight on file to calculate BMI.  General - No distress ENT - Ears normal. Throat and pharynx normal. Neck supple. No adenopathy or masses in the neck or supraclavicular regions. Nasal septum midline, no deformities, nares patent, normal mucosa without swelling, no polyps, no bleeding. Paranasal sinuses are normal. No tenderness to the maxillary, frontal, ethmoids or mastoids. Cardiac - S1 and S2 normal, no murmurs, clicks, gallops or rubs. Regular rate and rhythm.  No edema or JVD. Chest - Chest is clear, no wheezing or rales. Normal symmetric air entry throughout both  lung fields. No chest wall deformities or tenderness. Back - Cervical, thoracic and lumbar spine exam is normal without tenderness, masses or kyphoscoliosis. Full range of motion without pain is noted. Abd - soft without tenderness, guarding, mass, rebound or organomegaly. Bowel sounds are normal. No CVA tenderness noted. Ext - good pulses, motor strength and sensation. Neuro - Cranial nerves are normal. PERLA. EOM's intact. Skin - no discernible active dermatitis, erythema, urticaria or inflammatory process. Psych - normal mood, and behavior.  Echo 10/25/11>>EF 55%, mild LVH  CHEST - 2 VIEW 10/24/11 Comparison: Multiple priors, most recently chest x-ray 06/06/2011.  Findings: Lung volumes are normal. No consolidative airspace  disease. No pleural effusions. No pneumothorax. No pulmonary  nodule or mass noted. Pulmonary vasculature and the  cardiomediastinal silhouette are within normal limits.  IMPRESSION:  1. No radiographic evidence of acute cardiopulmonary disease.  Original Report Authenticated By: Florencia Reasons, M.D.  CBC    Component Value Date/Time   WBC 11.7* 06/06/2011 1555   RBC 4.19* 06/06/2011 1555   HGB 15.0 10/24/2011 2007   HCT 44.0 10/24/2011 2007   PLT 231 06/06/2011 1555   MCV 95.9 06/06/2011 1555   MCH 32.2 06/06/2011 1555   MCHC 33.6 06/06/2011 1555   RDW 12.8 06/06/2011 1555   LYMPHSABS 0.9 06/06/2011 1555   MONOABS 0.8 06/06/2011 1555   EOSABS 0.2 06/06/2011 1555   BASOSABS 0.0 06/06/2011 1555    BMET    Component Value Date/Time   NA 139 10/24/2011 2103   K 3.5 10/24/2011 2103   CL 103 10/24/2011 2103   CO2 27 10/24/2011 2103   GLUCOSE 100* 10/24/2011 2103   BUN 15 10/24/2011 2103   CREATININE 0.90 10/24/2011 2103   CALCIUM 10.0 10/24/2011 2103   GFRNONAA >90 10/24/2011 2103   GFRAA >90 10/24/2011 2103    Lab Results  Component Value Date   ALT 29 10/24/2011   AST 32 10/24/2011   ALKPHOS 106 10/24/2011   BILITOT 0.5 10/24/2011      Assessment/Plan:  Coralyn Helling, MD [place] [facility] Pager:  [phone] 12/09/2011, 11:57 AM  Patient Instructions  Combivent one puff up to four times per day as needed for cough, wheeze, or chest congestion Will schedule breathing test (PFT) Will schedule sleep study Follow up in 4 to 6 weeks

## 2011-12-09 NOTE — Assessment & Plan Note (Signed)
Discussed the importance of smoking cessation. 

## 2011-12-09 NOTE — Patient Instructions (Signed)
Combivent one puff up to four times per day as needed for cough, wheeze, or chest congestion Will schedule breathing test (PFT) Will schedule sleep study Follow up in 4 to 6 weeks

## 2011-12-09 NOTE — Assessment & Plan Note (Signed)
He has snoring, daytime sleepiness, and witnessed apnea.  He has a history of hypertension.  I am concerned he could have sleep apnea.  I have explained how sleep apnea can affect the patient's health.  Driving precautions and importance of weight loss were discussed.  Treatment options for sleep apnea were reviewed.  To further assess will arrange for in lab sleep study.

## 2012-01-03 ENCOUNTER — Ambulatory Visit (HOSPITAL_BASED_OUTPATIENT_CLINIC_OR_DEPARTMENT_OTHER): Payer: Medicare Other | Attending: Pulmonary Disease | Admitting: Radiology

## 2012-01-03 VITALS — Ht 68.0 in | Wt 266.0 lb

## 2012-01-03 DIAGNOSIS — R0683 Snoring: Secondary | ICD-10-CM

## 2012-01-03 DIAGNOSIS — G4733 Obstructive sleep apnea (adult) (pediatric): Secondary | ICD-10-CM | POA: Insufficient documentation

## 2012-01-11 ENCOUNTER — Encounter: Payer: Self-pay | Admitting: Pulmonary Disease

## 2012-01-11 ENCOUNTER — Telehealth: Payer: Self-pay | Admitting: Pulmonary Disease

## 2012-01-11 ENCOUNTER — Ambulatory Visit: Payer: Medicare Other | Admitting: Pulmonary Disease

## 2012-01-11 DIAGNOSIS — R0683 Snoring: Secondary | ICD-10-CM

## 2012-01-11 DIAGNOSIS — G4733 Obstructive sleep apnea (adult) (pediatric): Secondary | ICD-10-CM

## 2012-01-11 NOTE — Procedures (Signed)
NAME:  Peter Manning, Peter Manning NO.:  [id number]  MEDICAL RECORD NO.:  [id number]          PATIENT TYPE:  OUT  LOCATION:  SLEEP CENTER                 FACILITY:  [facility]  PHYSICIAN:  Coralyn Helling, MD        DATE OF BIRTH:  August 20, 1955  DATE OF STUDY:  01/03/2012                           NOCTURNAL POLYSOMNOGRAM  REFERRING PHYSICIAN:  Coralyn Helling, MD  REFERRING PHYSICIAN:  Coralyn Helling, MD  INDICATION:  Peter Manning is a 56 year old male who has a history of hypertension.  He also has symptoms of snoring, sleep disruption, and daytime sleepiness.  He is referred to sleep lab for evaluation of hypersomnia with obstructive sleep apnea.  Height is 5 feet 8 inches, weight is 266 pounds, BMI is 40, neck size 19 inches.  Medications are hydrocodone, lisinopril, Combivent, and ProAir. Epworth score is 20.  SLEEP ARCHITECTURE:  The patient followed a split night study protocol. During the diagnostic portion of study, total recording time was 168 minutes.  Total sleep time was 123 minutes.  Sleep efficiency was 73%. Sleep latency was 10 minutes.  REM latency was 123 minutes.  This portion of study was notable for lack of slow-wave sleep and the patient slept predominantly in the nonsupine position.  During the titration portion of study, total recording time was 265 minutes.  Total sleep time was 199 minutes.  Sleep efficiency was 75%. Sleep latency was 1 minute.  REM latency was 128 minutes.  This portion of study was notable for lack of slow-wave sleep and he slept in both supine and nonsupine positions.  RESPIRATORY DATA:  The average respiratory rate was 18.  During the diagnostic portion of the study, loud snoring was noted by the technician.  The overall apnea/hypopnea index was 45.7.  The events were exclusively obstructive in nature.  During the titration portion of study, the patient was started on CPAP of 5 and increased to 21 cm of water.  He had good control of  his obstructive respiratory events with CPAP of 15 but had intermittent episodes of snoring which were improved with higher pressure settings. He was observed in REM sleep and supine sleep with CPAP of 15 cm water as well as higher pressure settings.  OXYGEN DATA:  Baseline oxygenation was 95%.  The oxygen saturation nadir was 73%.  The patient had good control of his oxygenation with CPAP of 15 cm water.  The study was conducted without the use of supplemental oxygen.  CARDIAC DATA:  The average heart rate was 65 with the rhythm strip that showed sinus rhythm with occasional PVCs.  MOVEMENT/PARASOMNIA:  The patient had 1 restroom trip and the periodic limb movement index was 0.  IMPRESSION:  This study shows evidence for severe obstructive sleep apnea with an apnea/hypopnea index of 45.7 and oxygen saturation nadir of 73%.  He had good control of his sleep-disordered breathing with CPAP of 15 cm of water.  He did have intermittent episodes of snoring with CPAP which were controlled with higher pressure settings.  In addition to diet, exercise, and weight reduction, I would recommend that the patient be started on CPAP of 15 cm of water and  monitored for his clinical response.     Coralyn Helling, MD Diplomat, American Board of Sleep Medicine    VS/MEDQ  D:  01/11/2012 07:57:02  T:  01/11/2012 08:55:03  Job:  [id number]

## 2012-01-11 NOTE — Telephone Encounter (Signed)
PSG 01/03/12>>AHI 45.7, SpO2 low 73%, PLMI 0.  CPAP 15 cm H2O>>AHI 0, +R.  Attempted to contact patient to discuss results.  He no longer resides at home number, and unable to contact on cell number.  Will have my nurse schedule ROV to discuss sleep test results.

## 2012-01-12 NOTE — Telephone Encounter (Signed)
lmomtcb x1 

## 2012-01-13 NOTE — Telephone Encounter (Signed)
lmomtcb x2 for pt 

## 2012-01-16 ENCOUNTER — Encounter: Payer: Self-pay | Admitting: *Deleted

## 2012-01-16 NOTE — Telephone Encounter (Signed)
Sent letter out to pt home address.

## 2012-01-16 NOTE — Telephone Encounter (Signed)
lmomtcb x3 on cell. Called home # and was NA and unable to leave message

## 2012-01-25 ENCOUNTER — Encounter: Payer: Self-pay | Admitting: *Deleted

## 2012-01-25 NOTE — Telephone Encounter (Signed)
Noted  

## 2012-01-25 NOTE — Telephone Encounter (Signed)
ATC PT again still NA will send another letter out to pt home. Will forward to VS advising him I have been trying to reach pt w/o a response back. Will sign off message.

## 2012-02-14 ENCOUNTER — Emergency Department (HOSPITAL_COMMUNITY): Payer: Medicare Other

## 2012-02-14 ENCOUNTER — Encounter (HOSPITAL_COMMUNITY): Payer: Self-pay | Admitting: *Deleted

## 2012-02-14 ENCOUNTER — Emergency Department (HOSPITAL_COMMUNITY)
Admission: EM | Admit: 2012-02-14 | Discharge: 2012-02-14 | Disposition: A | Discharge status: Home / Self Care | Payer: Medicare Other | Attending: Emergency Medicine | Admitting: Emergency Medicine

## 2012-02-14 DIAGNOSIS — IMO0002 Reserved for concepts with insufficient information to code with codable children: Secondary | ICD-10-CM | POA: Insufficient documentation

## 2012-02-14 DIAGNOSIS — J4489 Other specified chronic obstructive pulmonary disease: Secondary | ICD-10-CM | POA: Insufficient documentation

## 2012-02-14 DIAGNOSIS — R059 Cough, unspecified: Secondary | ICD-10-CM | POA: Insufficient documentation

## 2012-02-14 DIAGNOSIS — E860 Dehydration: Secondary | ICD-10-CM | POA: Insufficient documentation

## 2012-02-14 DIAGNOSIS — D649 Anemia, unspecified: Secondary | ICD-10-CM

## 2012-02-14 DIAGNOSIS — Z79899 Other long term (current) drug therapy: Secondary | ICD-10-CM | POA: Insufficient documentation

## 2012-02-14 DIAGNOSIS — R7309 Other abnormal glucose: Secondary | ICD-10-CM | POA: Insufficient documentation

## 2012-02-14 DIAGNOSIS — R05 Cough: Secondary | ICD-10-CM | POA: Insufficient documentation

## 2012-02-14 DIAGNOSIS — F17200 Nicotine dependence, unspecified, uncomplicated: Secondary | ICD-10-CM | POA: Insufficient documentation

## 2012-02-14 DIAGNOSIS — I10 Essential (primary) hypertension: Secondary | ICD-10-CM | POA: Insufficient documentation

## 2012-02-14 DIAGNOSIS — E785 Hyperlipidemia, unspecified: Secondary | ICD-10-CM | POA: Insufficient documentation

## 2012-02-14 DIAGNOSIS — R55 Syncope and collapse: Secondary | ICD-10-CM | POA: Insufficient documentation

## 2012-02-14 DIAGNOSIS — R42 Dizziness and giddiness: Secondary | ICD-10-CM | POA: Insufficient documentation

## 2012-02-14 DIAGNOSIS — R739 Hyperglycemia, unspecified: Secondary | ICD-10-CM

## 2012-02-14 DIAGNOSIS — G4733 Obstructive sleep apnea (adult) (pediatric): Secondary | ICD-10-CM | POA: Insufficient documentation

## 2012-02-14 DIAGNOSIS — J449 Chronic obstructive pulmonary disease, unspecified: Secondary | ICD-10-CM | POA: Insufficient documentation

## 2012-02-14 LAB — URINALYSIS, ROUTINE W REFLEX MICROSCOPIC
Bilirubin Urine: NEGATIVE
Ketones, ur: NEGATIVE mg/dL
Nitrite: NEGATIVE
Specific Gravity, Urine: 1.022 (ref 1.005–1.030)
Urobilinogen, UA: 1 mg/dL (ref 0.0–1.0)

## 2012-02-14 LAB — BASIC METABOLIC PANEL
BUN: 10 mg/dL (ref 6–23)
Creatinine, Ser: 0.87 mg/dL (ref 0.50–1.35)
GFR calc Af Amer: >90 mL/min (ref >90)
GFR calc non Af Amer: >90 mL/min (ref >90)
Potassium: 3.7 mEq/L (ref 3.5–5.1)

## 2012-02-14 LAB — CBC
HCT: 37.4 % — ABNORMAL LOW (ref 39.0–52.0)
MCHC: 34.5 g/dL (ref 30.0–36.0)
Platelets: 217 10*3/uL (ref 150–400)
RDW: 12.9 % (ref 11.5–15.5)

## 2012-02-14 MED ORDER — SODIUM CHLORIDE 0.9 % IV BOLUS (SEPSIS)
1000.0000 mL | Freq: Once | INTRAVENOUS | Status: AC
  Administered 2012-02-14: 1000 mL via INTRAVENOUS

## 2012-02-14 NOTE — ED Provider Notes (Signed)
History     CSN: [id number]  Arrival date & time 02/14/12  0129   First MD Initiated Contact with Patient 02/14/12 0249      Chief Complaint  Patient presents with  . Dizziness    (Consider location/radiation/quality/duration/timing/severity/associated sxs/prior treatment) HPI 56 yo male presents to the ER with complaint of dizziness.  Dizziness is described as feeling like he is going to pass out when standing.  Pt started on steroids recently for copd/asthma.  Pt denies chest pain, sob, fever, headache, vertigo or spinning feeling, focal weakness or numbness.  Pt has been thirsty with dry mouth since starting prednisone.  No prior h/o diabetes or elevated sugars.  Pt denies polyphagia or polydipsia.  Pt feels he has been eating/drinking well.  No prior h/o similar complaints.  No palpitations.   Past Medical History  Diagnosis Date  . Hypertension   . Hyperlipidemia   . OSA (obstructive sleep apnea) 12/09/2011    PSG 01/03/12>>AHI 45.7, SpO2 low 73%, PLMI 0.  CPAP 15 cm H2O>>AHI 0, +R.     Past Surgical History  Procedure Date  . Joint replacement   . Back surgery   . Total hip arthroplasty     Family History  Problem Relation Age of Onset  . Heart attack Father   . Cancer Mother     History  Substance Use Topics  . Smoking status: Current Everyday Smoker -- 39 years  . Smokeless tobacco: Not on file   Comment: 5-6 cigs a day  . Alcohol Use: No      Review of Systems  All other systems reviewed and are negative.    Allergies  Review of patient's allergies indicates no known allergies.  Home Medications   Current Outpatient Rx  Name Route Sig Dispense Refill  . ALBUTEROL SULFATE HFA 108 (90 BASE) MCG/ACT IN AERS Inhalation Inhale 2 puffs into the lungs every 4 (four) hours as needed. For wheezing     . HYDROCODONE-ACETAMINOPHEN 5-500 MG PO TABS Oral Take 1 tablet by mouth every 4 (four) hours as needed. For pain    . IPRATROPIUM-ALBUTEROL 20-100 MCG/ACT IN  AERS Inhalation Inhale 1 puff into the lungs every 6 (six) hours. 1 Inhaler 5  . LISINOPRIL 10 MG PO TABS Oral Take 10 mg by mouth daily.      Marland Kitchen PREDNISONE 5 MG PO TABS Oral Take 5 mg by mouth daily.      BP 102/59  Pulse 60  Resp 23  SpO2 97%  Physical Exam  Nursing note and vitals reviewed. Constitutional: He is oriented to person, place, and time. He appears well-developed and well-nourished.  HENT:  Head: Normocephalic and atraumatic.  Right Ear: External ear normal.  Left Ear: External ear normal.  Nose: Nose normal.  Mouth/Throat: Oropharynx is clear and moist.  Eyes: Conjunctivae and EOM are normal. Pupils are equal, round, and reactive to light.  Neck: Normal range of motion. Neck supple. No JVD present. No tracheal deviation present. No thyromegaly present.  Cardiovascular: Normal rate, regular rhythm, normal heart sounds and intact distal pulses.  Exam reveals no gallop and no friction rub.   No murmur heard. Pulmonary/Chest: Effort normal and breath sounds normal. No stridor. No respiratory distress. He has no wheezes. He has no rales. He exhibits no tenderness.  Abdominal: Soft. Bowel sounds are normal. He exhibits no distension and no mass. There is no tenderness. There is no rebound and no guarding.  Musculoskeletal: Normal range of motion. He exhibits  no edema and no tenderness.  Lymphadenopathy:    He has no cervical adenopathy.  Neurological: He is alert and oriented to person, place, and time. He has normal reflexes. No cranial nerve deficit. He exhibits normal muscle tone. Coordination normal.  Skin: Skin is dry. No rash noted. No erythema. No pallor.  Psychiatric: He has a normal mood and affect. His behavior is normal. Judgment and thought content normal.    ED Course  Procedures (including critical care time)  Labs Reviewed  CBC - Abnormal; Notable for the following:    RBC 3.92 (*)     Hemoglobin 12.9 (*)     HCT 37.4 (*)     All other components within  normal limits  BASIC METABOLIC PANEL - Abnormal; Notable for the following:    Glucose, Bld 146 (*)     All other components within normal limits   Dg Chest 2 View  02/14/2012  *RADIOLOGY REPORT*  Clinical Data: Cough.  Syncope.  CHEST - 2 VIEW  Comparison: 10/24/2011.  Findings:  Cardiopericardial silhouette within normal limits. Mediastinal contours normal. Trachea midline.  No airspace disease or effusion. Monitoring leads are projected over the chest.  Mild basilar atelectasis. Lower lung volumes than on prior.  IMPRESSION: No acute cardiopulmonary disease.  Original Report Authenticated By: Andreas Newport, M.D.    Date: 02/14/2012  Rate: 57   Rhythm: sinus bradycardia  QRS Axis: normal  Intervals: normal  ST/T Wave abnormalities: normal  Conduction Disutrbances:none  Narrative Interpretation:   Old EKG Reviewed: unchanged   1. Dehydration   2. Hyperglycemia   3. Anemia       MDM  56 yo male with dizziness with standing, slight elevated glucose.  Pt appears clinically dehydrated.  Sxs improved with ivf.  Noted to have anemia, no prior h/o same.  Has f/u on Thursday with pcm, will have him d/w pcm regarding anemia.  Suspect hyperglycemia due to steroids.          Olivia Mackie, MD 02/14/12 2122

## 2012-02-14 NOTE — ED Notes (Signed)
Vital signs stable. 

## 2012-02-14 NOTE — ED Notes (Signed)
MD at bedside. 

## 2012-02-14 NOTE — ED Notes (Signed)
Per EMS: pt coming from home with c/o vertigo. Pt states he was given prednisone a week ago. Pt reports having dry mouth and dizziness every since pt started medication. Pt is A&O. No acute distress

## 2012-02-14 NOTE — ED Notes (Signed)
[id number]<AV> Expected date:02/14/12<BR> Expected time: 1:09 AM<BR> Means of arrival:Ambulance<BR> Comments:<BR> Vertigo, dehydration

## 2012-02-22 ENCOUNTER — Telehealth: Payer: Self-pay | Admitting: *Deleted

## 2012-02-22 NOTE — Telephone Encounter (Signed)
error 

## 2012-02-23 ENCOUNTER — Ambulatory Visit: Payer: Medicare Other | Admitting: Pulmonary Disease

## 2012-03-20 DIAGNOSIS — G8929 Other chronic pain: Secondary | ICD-10-CM | POA: Insufficient documentation

## 2012-03-20 DIAGNOSIS — Z809 Family history of malignant neoplasm, unspecified: Secondary | ICD-10-CM | POA: Insufficient documentation

## 2012-03-20 DIAGNOSIS — G4733 Obstructive sleep apnea (adult) (pediatric): Secondary | ICD-10-CM | POA: Insufficient documentation

## 2012-03-20 DIAGNOSIS — M79609 Pain in unspecified limb: Secondary | ICD-10-CM | POA: Insufficient documentation

## 2012-03-20 DIAGNOSIS — I10 Essential (primary) hypertension: Secondary | ICD-10-CM | POA: Insufficient documentation

## 2012-03-20 DIAGNOSIS — F17200 Nicotine dependence, unspecified, uncomplicated: Secondary | ICD-10-CM | POA: Insufficient documentation

## 2012-03-20 DIAGNOSIS — Z8249 Family history of ischemic heart disease and other diseases of the circulatory system: Secondary | ICD-10-CM | POA: Insufficient documentation

## 2012-03-21 ENCOUNTER — Encounter (HOSPITAL_COMMUNITY): Payer: Self-pay | Admitting: *Deleted

## 2012-03-21 ENCOUNTER — Emergency Department (HOSPITAL_COMMUNITY): Payer: Medicare Other

## 2012-03-21 ENCOUNTER — Emergency Department (HOSPITAL_COMMUNITY)
Admission: EM | Admit: 2012-03-21 | Discharge: 2012-03-21 | Disposition: A | Discharge status: Home / Self Care | Payer: Medicare Other | Attending: Emergency Medicine | Admitting: Emergency Medicine

## 2012-03-21 DIAGNOSIS — M79643 Pain in unspecified hand: Secondary | ICD-10-CM

## 2012-03-21 MED ORDER — IBUPROFEN 800 MG PO TABS: 800.0000 mg | ORAL_TABLET | Freq: Three times a day (TID) | ORAL | Status: DC
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
## 2012-03-21 NOTE — ED Notes (Signed)
Patient is alert and oriented x3.  He was given DC instructions and follow up visit instructions.  Patient gave verbal understanding.  He was DC ambulatory under his own power to home.  V/S stable.  He was not showing any signs of distress on DC 
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
## 2012-03-29 ENCOUNTER — Emergency Department (HOSPITAL_COMMUNITY)
Admission: EM | Admit: 2012-03-29 | Discharge: 2012-03-30 | Disposition: A | Discharge status: Home / Self Care | Payer: Medicare Other | Attending: Emergency Medicine | Admitting: Emergency Medicine

## 2012-03-29 ENCOUNTER — Encounter (HOSPITAL_COMMUNITY): Payer: Self-pay | Admitting: Emergency Medicine

## 2012-03-29 DIAGNOSIS — K649 Unspecified hemorrhoids: Secondary | ICD-10-CM

## 2012-03-29 DIAGNOSIS — K625 Hemorrhage of anus and rectum: Secondary | ICD-10-CM | POA: Insufficient documentation

## 2012-03-29 DIAGNOSIS — I10 Essential (primary) hypertension: Secondary | ICD-10-CM | POA: Insufficient documentation

## 2012-03-29 DIAGNOSIS — E785 Hyperlipidemia, unspecified: Secondary | ICD-10-CM | POA: Insufficient documentation

## 2012-03-29 DIAGNOSIS — G4733 Obstructive sleep apnea (adult) (pediatric): Secondary | ICD-10-CM | POA: Insufficient documentation

## 2012-03-29 DIAGNOSIS — F17200 Nicotine dependence, unspecified, uncomplicated: Secondary | ICD-10-CM | POA: Insufficient documentation

## 2012-03-29 DIAGNOSIS — K644 Residual hemorrhoidal skin tags: Secondary | ICD-10-CM | POA: Insufficient documentation

## 2012-03-29 HISTORY — DX: Bronchitis, not specified as acute or chronic: J40

## 2012-03-29 LAB — COMPREHENSIVE METABOLIC PANEL
ALT: 33 U/L (ref 0–53)
AST: 41 U/L — ABNORMAL HIGH (ref 0–37)
Albumin: 4 g/dL (ref 3.5–5.2)
Alkaline Phosphatase: 98 U/L (ref 39–117)
Calcium: 9.9 mg/dL (ref 8.4–10.5)
Glucose, Bld: 144 mg/dL — ABNORMAL HIGH (ref 70–99)
Potassium: 3.7 mEq/L (ref 3.5–5.1)
Sodium: 140 mEq/L (ref 135–145)
Total Protein: 7.9 g/dL (ref 6.0–8.3)

## 2012-03-29 LAB — CBC
Hemoglobin: 13.6 g/dL (ref 13.0–17.0)
MCH: 33.5 pg (ref 26.0–34.0)
MCV: 94.6 fL (ref 78.0–100.0)
RBC: 4.06 MIL/uL — ABNORMAL LOW (ref 4.22–5.81)

## 2012-03-29 NOTE — ED Notes (Signed)
Pt reports about 5 minutes ago, noticed blood when he wiped after having bowel movement-states it was bright red, and thought he could see some in his stool

## 2012-03-30 MED ORDER — POLYETHYLENE GLYCOL 3350 17 G PO PACK: 17.0000 g | PACK | Freq: Every day | ORAL | Status: DC

## 2012-03-30 MED ORDER — HYDROCORTISONE ACETATE 25 MG RE SUPP: 25.0000 mg | Freq: Two times a day (BID) | RECTAL | Status: DC

## 2012-03-30 NOTE — ED Provider Notes (Signed)
History     CSN: [id number]  Arrival date & time 03/29/12  2040   First MD Initiated Contact with Patient 03/30/12 0031      Chief Complaint  Patient presents with  . Rectal Bleeding    (Consider location/radiation/quality/duration/timing/severity/associated sxs/prior treatment) HPI 56 yo male presents to the ER with complaint of rectal bleeding.  Pt reports after having BM this evening he noticed blood on tissue when wiping and in bowl.  No prior h/o same.  Pt does not know if he has hemorrhoids.  No abd pain.  No prior colonoscopy.  Pt not on blood thinners.  No family history of colon cancer.   Past Medical History  Diagnosis Date  . Hypertension   . Hyperlipidemia   . OSA (obstructive sleep apnea) 12/09/2011    PSG 01/03/12>>AHI 45.7, SpO2 low 73%, PLMI 0.  CPAP 15 cm H2O>>AHI 0, +R.   Marland Kitchen Bronchitis     Past Surgical History  Procedure Date  . Joint replacement   . Back surgery   . Total hip arthroplasty     Family History  Problem Relation Age of Onset  . Heart attack Father   . Cancer Mother     History  Substance Use Topics  . Smoking status: Current Some Day Smoker -- 39 years  . Smokeless tobacco: Not on file   Comment: states on and off smoker  . Alcohol Use: No      Review of Systems  All other systems reviewed and are negative.    Allergies  Review of patient's allergies indicates no known allergies.  Home Medications   Current Outpatient Rx  Name Route Sig Dispense Refill  . ALBUTEROL SULFATE HFA 108 (90 BASE) MCG/ACT IN AERS Inhalation Inhale 2 puffs into the lungs every 4 (four) hours as needed. For wheezing     . HYDROCODONE-ACETAMINOPHEN 10-500 MG PO TABS Oral Take 1 tablet by mouth every 6 (six) hours as needed. For pain    . IPRATROPIUM-ALBUTEROL 20-100 MCG/ACT IN AERS Inhalation Inhale 1 puff into the lungs every 6 (six) hours. 1 Inhaler 5  . LISINOPRIL 10 MG PO TABS Oral Take 10 mg by mouth every morning.     Marland Kitchen HYDROCORTISONE ACETATE  25 MG RE SUPP Rectal Place 1 suppository (25 mg total) rectally 2 (two) times daily. 12 suppository 0  . POLYETHYLENE GLYCOL 3350 PO PACK Oral Take 17 g by mouth daily. 14 each 0    BP 124/79  Pulse 65  Temp 98.3 F (36.8 C) (Oral)  Resp 16  SpO2 99%  Physical Exam  Nursing note and vitals reviewed. Constitutional: He is oriented to person, place, and time. He appears well-developed and well-nourished. No distress.  HENT:  Head: Normocephalic and atraumatic.  Nose: Nose normal.  Mouth/Throat: Oropharynx is clear and moist.  Eyes: Conjunctivae normal and EOM are normal. Pupils are equal, round, and reactive to light.  Neck: Normal range of motion. Neck supple. No JVD present. No tracheal deviation present. No thyromegaly present.  Cardiovascular: Normal rate, regular rhythm, normal heart sounds and intact distal pulses.  Exam reveals no gallop and no friction rub.   No murmur heard. Pulmonary/Chest: Effort normal and breath sounds normal. No stridor. No respiratory distress. He has no wheezes. He has no rales. He exhibits no tenderness.  Abdominal: Soft. Bowel sounds are normal. He exhibits no distension and no mass. There is no tenderness. There is no rebound and no guarding.  Genitourinary: Guaiac positive stool.  hemorrhoids noted, not inflamed, no thromboses noted  Musculoskeletal: Normal range of motion. He exhibits no edema and no tenderness.  Lymphadenopathy:    He has no cervical adenopathy.  Neurological: He is alert and oriented to person, place, and time. He exhibits normal muscle tone. Coordination normal.  Skin: Skin is warm and dry. No rash noted. He is not diaphoretic. No erythema. No pallor.  Psychiatric: He has a normal mood and affect. His behavior is normal. Judgment and thought content normal.    ED Course  Procedures (including critical care time)  Labs Reviewed  CBC - Abnormal; Notable for the following:    RBC 4.06 (*)     HCT 38.4 (*)     All other  components within normal limits  COMPREHENSIVE METABOLIC PANEL - Abnormal; Notable for the following:    Glucose, Bld 144 (*)     AST 41 (*)     All other components within normal limits  OCCULT BLOOD, POC DEVICE  LAB REPORT - SCANNED   No results found.   1. Rectal bleeding   2. Hemorrhoid       MDM  56 yo male with rectal bleeding, normal h/h, small hemorrhoids.  To f/u with pcm/gi.        Olivia Mackie, MD 03/30/12 2156

## 2012-04-28 ENCOUNTER — Encounter (HOSPITAL_COMMUNITY): Payer: Self-pay | Admitting: Emergency Medicine

## 2012-04-28 ENCOUNTER — Emergency Department (INDEPENDENT_AMBULATORY_CARE_PROVIDER_SITE_OTHER)
Admission: EM | Admit: 2012-04-28 | Discharge: 2012-04-28 | Disposition: A | Discharge status: Home / Self Care | Payer: Medicare Other | Source: Home / Self Care | Attending: Family Medicine | Admitting: Family Medicine

## 2012-04-28 DIAGNOSIS — J209 Acute bronchitis, unspecified: Secondary | ICD-10-CM

## 2012-04-28 DIAGNOSIS — J42 Unspecified chronic bronchitis: Secondary | ICD-10-CM

## 2012-04-28 MED ORDER — LEVOFLOXACIN 500 MG PO TABS: 500.0000 mg | ORAL_TABLET | Freq: Every day | ORAL | Status: DC

## 2012-04-28 MED ORDER — HYDROCOD POLST-CHLORPHEN POLST 10-8 MG/5ML PO LQCR: 5.0000 mL | Freq: Two times a day (BID) | ORAL | Status: DC

## 2012-04-28 NOTE — ED Notes (Signed)
Pt c/o back pain and has concerns about pneumonia. Pt has a hx of back surgery 2012  Pt states that he has a productive cough with yellow sputum x 1wk.  Pt states back hurts worse with coughing.   Pt denies any other symptoms.

## 2012-04-28 NOTE — ED Provider Notes (Signed)
History     CSN: [id number]  Arrival date & time 04/28/12  1814   First MD Initiated Contact with Patient 04/28/12 1821      Chief Complaint  Patient presents with  . Back Pain    pt c/o back pain x 3 days. pt has a concern about pneumonia. hx of back surgery.  . Cough    productive cough with yellow sputum x 1wk.    (Consider location/radiation/quality/duration/timing/severity/associated sxs/prior treatment) Patient is a 56 y.o. male presenting with cough. The history is provided by the patient.  Cough This is a new problem. The current episode started more than 1 week ago. The problem has been gradually worsening. The cough is productive of sputum. There has been no fever. Pertinent negatives include no shortness of breath and no wheezing. He is a smoker. His past medical history is significant for bronchitis. His past medical history does not include pneumonia.    Past Medical History  Diagnosis Date  . Hypertension   . Hyperlipidemia   . OSA (obstructive sleep apnea) 12/09/2011    PSG 01/03/12>>AHI 45.7, SpO2 low 73%, PLMI 0.  CPAP 15 cm H2O>>AHI 0, +R.   Marland Kitchen Bronchitis     Past Surgical History  Procedure Date  . Joint replacement   . Back surgery   . Total hip arthroplasty     Family History  Problem Relation Age of Onset  . Heart attack Father   . Cancer Mother     History  Substance Use Topics  . Smoking status: Current Some Day Smoker -- 0.5 packs/day for 39 years    Types: Cigarettes  . Smokeless tobacco: Not on file   Comment: states on and off smoker  . Alcohol Use: Yes     occassional      Review of Systems  Constitutional: Negative.   HENT: Negative.   Respiratory: Positive for cough. Negative for shortness of breath and wheezing.   Cardiovascular: Negative.     Allergies  Review of patient's allergies indicates no known allergies.  Home Medications   Current Outpatient Rx  Name Route Sig Dispense Refill  . HYDROCODONE-ACETAMINOPHEN  10-500 MG PO TABS Oral Take 1 tablet by mouth every 6 (six) hours as needed. For pain    . ALBUTEROL SULFATE HFA 108 (90 BASE) MCG/ACT IN AERS Inhalation Inhale 2 puffs into the lungs every 4 (four) hours as needed. For wheezing     . HYDROCOD POLST-CPM POLST ER 10-8 MG/5ML PO LQCR Oral Take 5 mLs by mouth every 12 (twelve) hours. 115 mL 0  . HYDROCORTISONE ACETATE 25 MG RE SUPP Rectal Place 1 suppository (25 mg total) rectally 2 (two) times daily. 12 suppository 0  . IPRATROPIUM-ALBUTEROL 20-100 MCG/ACT IN AERS Inhalation Inhale 1 puff into the lungs every 6 (six) hours. 1 Inhaler 5  . LEVOFLOXACIN 500 MG PO TABS Oral Take 1 tablet (500 mg total) by mouth daily. 10 tablet 0  . LISINOPRIL 10 MG PO TABS Oral Take 10 mg by mouth every morning.     Marland Kitchen POLYETHYLENE GLYCOL 3350 PO PACK Oral Take 17 g by mouth daily. 14 each 0    BP 136/84  Pulse 63  Temp 97.9 F (36.6 C) (Oral)  SpO2 100%  Physical Exam  Nursing note and vitals reviewed. Constitutional: He is oriented to person, place, and time. He appears well-developed and well-nourished.  HENT:  Head: Normocephalic.  Right Ear: External ear normal.  Left Ear: External ear normal.  Mouth/Throat:  Oropharynx is clear and moist.  Eyes: Pupils are equal, round, and reactive to light.  Neck: Normal range of motion. Neck supple.  Cardiovascular: Normal rate, regular rhythm and normal heart sounds.   Pulmonary/Chest: Effort normal. He has decreased breath sounds. He has rhonchi.  Lymphadenopathy:    He has no cervical adenopathy.  Neurological: He is alert and oriented to person, place, and time.  Skin: Skin is warm and dry.    ED Course  Procedures (including critical care time)  Labs Reviewed - No data to display No results found.   1. Chronic bronchitis with acute exacerbation       MDM          Linna Hoff, MD 04/28/12 1929

## 2012-09-02 ENCOUNTER — Emergency Department (HOSPITAL_COMMUNITY)
Admission: EM | Admit: 2012-09-02 | Discharge: 2012-09-02 | Disposition: A | Discharge status: Home / Self Care | Payer: Medicare Other | Attending: Emergency Medicine | Admitting: Emergency Medicine

## 2012-09-02 ENCOUNTER — Encounter (HOSPITAL_COMMUNITY): Payer: Self-pay | Admitting: Emergency Medicine

## 2012-09-02 ENCOUNTER — Emergency Department (HOSPITAL_COMMUNITY): Payer: Medicare Other

## 2012-09-02 DIAGNOSIS — Z79899 Other long term (current) drug therapy: Secondary | ICD-10-CM | POA: Insufficient documentation

## 2012-09-02 DIAGNOSIS — E785 Hyperlipidemia, unspecified: Secondary | ICD-10-CM | POA: Insufficient documentation

## 2012-09-02 DIAGNOSIS — Z8669 Personal history of other diseases of the nervous system and sense organs: Secondary | ICD-10-CM | POA: Insufficient documentation

## 2012-09-02 DIAGNOSIS — Z8709 Personal history of other diseases of the respiratory system: Secondary | ICD-10-CM | POA: Insufficient documentation

## 2012-09-02 DIAGNOSIS — I10 Essential (primary) hypertension: Secondary | ICD-10-CM | POA: Insufficient documentation

## 2012-09-02 DIAGNOSIS — R079 Chest pain, unspecified: Secondary | ICD-10-CM

## 2012-09-02 DIAGNOSIS — F17200 Nicotine dependence, unspecified, uncomplicated: Secondary | ICD-10-CM | POA: Insufficient documentation

## 2012-09-02 LAB — CBC WITH DIFFERENTIAL/PLATELET
Basophils Relative: 1 % (ref 0–1)
Eosinophils Absolute: 0.2 10*3/uL (ref 0.0–0.7)
HCT: 41.3 % (ref 39.0–52.0)
Hemoglobin: 14.1 g/dL (ref 13.0–17.0)
Lymphs Abs: 1.7 10*3/uL (ref 0.7–4.0)
MCH: 32.1 pg (ref 26.0–34.0)
MCHC: 34.1 g/dL (ref 30.0–36.0)
MCV: 94.1 fL (ref 78.0–100.0)
Monocytes Absolute: 0.4 10*3/uL (ref 0.1–1.0)
Monocytes Relative: 6 % (ref 3–12)
RBC: 4.39 MIL/uL (ref 4.22–5.81)

## 2012-09-02 LAB — POCT I-STAT, CHEM 8
BUN: 13 mg/dL (ref 6–23)
Calcium, Ion: 1.3 mmol/L — ABNORMAL HIGH (ref 1.12–1.23)
Chloride: 104 mEq/L (ref 96–112)
Creatinine, Ser: 0.8 mg/dL (ref 0.50–1.35)
Sodium: 143 mEq/L (ref 135–145)
TCO2: 29 mmol/L (ref 0–100)

## 2012-09-02 LAB — POCT I-STAT TROPONIN I: Troponin i, poc: 0 ng/mL (ref 0.00–0.08)

## 2012-09-02 MED ORDER — OXYCODONE-ACETAMINOPHEN 5-325 MG PO TABS
1.0000 | ORAL_TABLET | Freq: Once | ORAL | Status: AC
  Administered 2012-09-02: 1 via ORAL
  Filled 2012-09-02: qty 1

## 2012-09-02 MED ORDER — HYDROCODONE-ACETAMINOPHEN 10-500 MG PO TABS: 1.0000 | ORAL_TABLET | Freq: Four times a day (QID) | ORAL | Status: DC | PRN

## 2012-09-02 NOTE — ED Provider Notes (Signed)
Medical screening examination/treatment/procedure(s) were performed by non-physician practitioner and as supervising physician I was immediately available for consultation/collaboration.  Ethelda Chick, MD 09/02/12 1945

## 2012-09-02 NOTE — ED Notes (Signed)
Pt c/o right side chest pain radiating down right arm. Symptoms onset yesterday while laying in bed. Pt reports shortness of breath, nausea, dizziness, and diaphoresis.

## 2012-09-02 NOTE — ED Provider Notes (Signed)
History     CSN: [id number]  Arrival date & time 09/02/12  1207   First MD Initiated Contact with Patient 09/02/12 1421      Chief Complaint  Patient presents with  . Chest Pain    (Consider location/radiation/quality/duration/timing/severity/associated sxs/prior treatment) HPI  56 year old male with history of hypertension, hyperlipidemia, and obstructive sleep apnea presents complaining of chest pain. Patient reports while in bed yesterday he moves his right shoulder and noticed a sharp pain starting from the shoulder and radiates to his right anterior chest. Pain is sharp impression sensation, lasting for seconds to minutes, worsening with movement, improved with rest. States when he sleep at night he is unable to turn to the affected side due to the pain. He did not take anything for the pain and denies any other specific treatment. Does not recall any recent strenuous exercise. Patient also complaining of a mouth problem headache to his for head that was gradual in onset and has been improving. He denies fever, chills, sneezing, runny nose, cough,, hemoptysis, dyspnea on exertion, chest pain on exertion, neck pain, back pain, nausea, vomiting, diarrhea, diaphoresis, abdominal pain, or rash. He is a smoker. No prior cardiac history, no prior cardiac stress test.    Past Medical History  Diagnosis Date  . Hypertension   . Hyperlipidemia   . OSA (obstructive sleep apnea) 12/09/2011    PSG 01/03/12>>AHI 45.7, SpO2 low 73%, PLMI 0.  CPAP 15 cm H2O>>AHI 0, +R.   Marland Kitchen Bronchitis     Past Surgical History  Procedure Laterality Date  . Joint replacement    . Back surgery    . Total hip arthroplasty      Family History  Problem Relation Age of Onset  . Heart attack Father   . Cancer Mother     History  Substance Use Topics  . Smoking status: Current Some Day Smoker -- 0.50 packs/day for 39 years    Types: Cigarettes  . Smokeless tobacco: Not on file     Comment: states on and off  smoker  . Alcohol Use: Yes     Comment: occassional Quit 06/2012      Review of Systems  Constitutional:       A complete 10 system review of systems was obtained and all systems are negative except as noted in the HPI and PMH.    Allergies  Review of patient's allergies indicates no known allergies.  Home Medications   Current Outpatient Rx  Name  Route  Sig  Dispense  Refill  . EXPIRED: albuterol (PROVENTIL HFA;VENTOLIN HFA) 108 (90 BASE) MCG/ACT inhaler   Inhalation   Inhale 2 puffs into the lungs every 4 (four) hours as needed. For wheezing          . chlorpheniramine-HYDROcodone (TUSSIONEX PENNKINETIC ER) 10-8 MG/5ML LQCR   Oral   Take 5 mLs by mouth every 12 (twelve) hours.   115 mL   0   . HYDROcodone-acetaminophen (LORTAB) 10-500 MG per tablet   Oral   Take 1 tablet by mouth every 6 (six) hours as needed. For pain         . hydrocortisone (ANUSOL-HC) 25 MG suppository   Rectal   Place 1 suppository (25 mg total) rectally 2 (two) times daily.   12 suppository   0   . Ipratropium-Albuterol (COMBIVENT RESPIMAT) 20-100 MCG/ACT AERS respimat   Inhalation   Inhale 1 puff into the lungs every 6 (six) hours.   1 Inhaler   5   .  levofloxacin (LEVAQUIN) 500 MG tablet   Oral   Take 1 tablet (500 mg total) by mouth daily.   10 tablet   0   . lisinopril (PRINIVIL,ZESTRIL) 10 MG tablet   Oral   Take 10 mg by mouth every morning.          . polyethylene glycol (MIRALAX / GLYCOLAX) packet   Oral   Take 17 g by mouth daily.   14 each   0     BP 123/78  Pulse 80  Temp(Src) 97.8 F (36.6 C) (Oral)  Resp 20  SpO2 96%  Physical Exam  Nursing note and vitals reviewed. Constitutional: He appears well-developed and well-nourished. No distress.  Awake, alert, nontoxic appearance. Morbidly obese.  HENT:  Head: Atraumatic.  Eyes: Conjunctivae are normal. Right eye exhibits no discharge. Left eye exhibits no discharge.  Neck: Normal range of motion. Neck  supple.  Cardiovascular: Normal rate and regular rhythm.   Pulmonary/Chest: Effort normal. No respiratory distress. He exhibits tenderness (Tenderness to right upper chest wall on palpation without crepitus or overlying skin changes.).  Abdominal: Soft. There is no tenderness. There is no rebound.  Musculoskeletal: He exhibits no tenderness (right shoulder: Tenderness to anterior lateral aspects of the shoulder without deformity noted. Shows full range of motion, tenderness with range of motion.).  ROM appears intact, no obvious focal weakness  Neurological: He is alert.  Skin: Skin is warm and dry. No rash noted.  Psychiatric: He has a normal mood and affect.    ED Course  Procedures (including critical care time)   Date: 09/02/2012  Rate: 75  Rhythm: normal sinus rhythm  QRS Axis: normal  Intervals: normal  ST/T Wave abnormalities: nonspecific T wave changes  Conduction Disutrbances:none  Narrative Interpretation:   Old EKG Reviewed: unchanged    Labs Reviewed  POCT I-STAT, CHEM 8 - Abnormal; Notable for the following:    Potassium 3.2 (*)    Glucose, Bld 121 (*)    Calcium, Ion 1.30 (*)    All other components within normal limits  CBC WITH DIFFERENTIAL  POCT I-STAT TROPONIN I   Dg Chest 2 View  09/02/2012  *RADIOLOGY REPORT*  Clinical Data: Chest pain, hypertension.  CHEST - 2 VIEW  Comparison: 02/14/2012  Findings: Heart and mediastinal contours are within normal limits. No focal opacities or effusions.  No acute bony abnormality.  IMPRESSION: No active cardiopulmonary disease.   Original Report Authenticated By: Charlett Nose, M.D.     Results for orders placed during the hospital encounter of 09/02/12  CBC WITH DIFFERENTIAL      Result Value Range   WBC 7.1  4.0 - 10.5 K/uL   RBC 4.39  4.22 - 5.81 MIL/uL   Hemoglobin 14.1  13.0 - 17.0 g/dL   HCT 78.2  95.6 - 21.3 %   MCV 94.1  78.0 - 100.0 fL   MCH 32.1  26.0 - 34.0 pg   MCHC 34.1  30.0 - 36.0 g/dL   RDW 08.6   57.8 - 46.9 %   Platelets 207  150 - 400 K/uL   Neutrophils Relative 67  43 - 77 %   Neutro Abs 4.8  1.7 - 7.7 K/uL   Lymphocytes Relative 24  12 - 46 %   Lymphs Abs 1.7  0.7 - 4.0 K/uL   Monocytes Relative 6  3 - 12 %   Monocytes Absolute 0.4  0.1 - 1.0 K/uL   Eosinophils Relative 3  0 - 5 %  Eosinophils Absolute 0.2  0.0 - 0.7 K/uL   Basophils Relative 1  0 - 1 %   Basophils Absolute 0.0  0.0 - 0.1 K/uL  POCT I-STAT TROPONIN I      Result Value Range   Troponin i, poc 0.00  0.00 - 0.08 ng/mL   Comment 3           POCT I-STAT, CHEM 8      Result Value Range   Sodium 143  135 - 145 mEq/L   Potassium 3.2 (*) 3.5 - 5.1 mEq/L   Chloride 104  96 - 112 mEq/L   BUN 13  6 - 23 mg/dL   Creatinine, Ser 1.61  0.50 - 1.35 mg/dL   Glucose, Bld 096 (*) 70 - 99 mg/dL   Calcium, Ion 0.45 (*) 1.12 - 1.23 mmol/L   TCO2 29  0 - 100 mmol/L   Hemoglobin 15.3  13.0 - 17.0 g/dL   HCT 40.9  81.1 - 91.4 %  POCT I-STAT TROPONIN I      Result Value Range   Troponin i, poc 0.00  0.00 - 0.08 ng/mL   Comment 3            Dg Chest 2 View  09/02/2012  *RADIOLOGY REPORT*  Clinical Data: Chest pain, hypertension.  CHEST - 2 VIEW  Comparison: 02/14/2012  Findings: Heart and mediastinal contours are within normal limits. No focal opacities or effusions.  No acute bony abnormality.  IMPRESSION: No active cardiopulmonary disease.   Original Report Authenticated By: Charlett Nose, M.D.      No diagnosis found.  3:27 PM The patient was seen and evaluated by me for his chest wall pain and right shoulder pain. Pain appears to be musculoskeletal in origin as it is reproducible. Pain is atypical for cardiac disease. However, patient does have multiple risk factors include smoking, hypertension, hyperlipidemia. He has no prior cardiac stress test.  I have discussed with attending, who recommend serial troponin check.  Pt currently in NAD, pain medication given.  ECG unremarkable, normal first set of troponin.    7:26  PM The patient felt better after receiving pain medication. Delta troponin is unremarkable. EKG unremarkable.  Will treat as musculoskeletal pain.  Return precaution given.  Pt to f/u with PCP for further care.    BP 131/71  Pulse 63  Temp(Src) 97.8 F (36.6 C) (Oral)  Resp 18  SpO2 98%   I have reviewed nursing notes and vital signs. I personally reviewed the imaging tests through PACS system  I reviewed available ER/hospitalization records thought the EMR  1. Chest wall pain  MDM          Fayrene Helper, PA-C 09/02/12 1931

## 2012-11-08 ENCOUNTER — Emergency Department (HOSPITAL_COMMUNITY): Payer: Medicare Other

## 2012-11-08 ENCOUNTER — Encounter (HOSPITAL_COMMUNITY): Payer: Self-pay | Admitting: Nurse Practitioner

## 2012-11-08 ENCOUNTER — Emergency Department (HOSPITAL_COMMUNITY)
Admission: EM | Admit: 2012-11-08 | Discharge: 2012-11-08 | Disposition: A | Discharge status: Home / Self Care | Payer: Medicare Other | Attending: Emergency Medicine | Admitting: Emergency Medicine

## 2012-11-08 DIAGNOSIS — M1990 Unspecified osteoarthritis, unspecified site: Secondary | ICD-10-CM

## 2012-11-08 DIAGNOSIS — M19019 Primary osteoarthritis, unspecified shoulder: Secondary | ICD-10-CM | POA: Insufficient documentation

## 2012-11-08 DIAGNOSIS — I10 Essential (primary) hypertension: Secondary | ICD-10-CM | POA: Insufficient documentation

## 2012-11-08 DIAGNOSIS — F17200 Nicotine dependence, unspecified, uncomplicated: Secondary | ICD-10-CM | POA: Insufficient documentation

## 2012-11-08 DIAGNOSIS — IMO0002 Reserved for concepts with insufficient information to code with codable children: Secondary | ICD-10-CM | POA: Insufficient documentation

## 2012-11-08 DIAGNOSIS — Z79899 Other long term (current) drug therapy: Secondary | ICD-10-CM | POA: Insufficient documentation

## 2012-11-08 DIAGNOSIS — Z8669 Personal history of other diseases of the nervous system and sense organs: Secondary | ICD-10-CM | POA: Insufficient documentation

## 2012-11-08 DIAGNOSIS — Z8709 Personal history of other diseases of the respiratory system: Secondary | ICD-10-CM | POA: Insufficient documentation

## 2012-11-08 DIAGNOSIS — E785 Hyperlipidemia, unspecified: Secondary | ICD-10-CM | POA: Insufficient documentation

## 2012-11-08 NOTE — ED Provider Notes (Signed)
History     CSN: [id number]  Arrival date & time 11/08/12  1055   First MD Initiated Contact with Patient 11/08/12 1202      Chief Complaint  Patient presents with  . Shoulder Pain    (Consider location/radiation/quality/duration/timing/severity/associated sxs/prior treatment) HPI Comments: Peter Manning is a 57 y/o M with PMHx of HTN, HLD, PSA presenting to the ED with right shoulder pain that has been ongoing x 2 months. Patient reported that pain is described as a aching sensation that is constant with radiation to the right elbow. Stated that stiffness is worse in the morning and at nighttime. Reported that pain is worse with motion and with laying on the right side. Patient has been taking hydrocodone -APAP for pain with minimal relief. Denied fever, chills, numbness, tingling, weakness, loss of sensation.    The history is provided by the patient. No language interpreter was used.    Past Medical History  Diagnosis Date  . Hypertension   . Hyperlipidemia   . OSA (obstructive sleep apnea) 12/09/2011    PSG 01/03/12>>AHI 45.7, SpO2 low 73%, PLMI 0.  CPAP 15 cm H2O>>AHI 0, +R.   Marland Kitchen Bronchitis     Past Surgical History  Procedure Laterality Date  . Joint replacement    . Back surgery    . Total hip arthroplasty      Family History  Problem Relation Age of Onset  . Heart attack Father   . Cancer Mother     History  Substance Use Topics  . Smoking status: Current Some Day Smoker -- 0.50 packs/day for 39 years    Types: Cigarettes  . Smokeless tobacco: Not on file     Comment: states on and off smoker  . Alcohol Use: No      Review of Systems  Constitutional: Negative for fever, chills and fatigue.  HENT: Negative for neck pain and neck stiffness.   Eyes: Negative for photophobia, pain and visual disturbance.  Respiratory: Negative for cough, chest tightness and shortness of breath.   Cardiovascular: Negative for chest pain.  Gastrointestinal: Negative for  nausea, vomiting, abdominal pain, diarrhea and constipation.  Genitourinary: Negative for decreased urine volume and difficulty urinating.  Musculoskeletal: Positive for arthralgias.       Right shoulder pain  Skin: Negative for rash and wound.  All other systems reviewed and are negative.    Allergies  Review of patient's allergies indicates no known allergies.  Home Medications   Current Outpatient Rx  Name  Route  Sig  Dispense  Refill  . albuterol (PROVENTIL HFA;VENTOLIN HFA) 108 (90 BASE) MCG/ACT inhaler   Inhalation   Inhale 2 puffs into the lungs every 6 (six) hours as needed for wheezing or shortness of breath.         Marland Kitchen HYDROcodone-acetaminophen (LORTAB) 10-500 MG per tablet   Oral   Take 1 tablet by mouth every 6 (six) hours as needed. For pain   15 tablet   0   . Ipratropium-Albuterol (COMBIVENT RESPIMAT) 20-100 MCG/ACT AERS respimat   Inhalation   Inhale 1 puff into the lungs every 6 (six) hours.   1 Inhaler   5   . lisinopril (PRINIVIL,ZESTRIL) 10 MG tablet   Oral   Take 10 mg by mouth every morning.          . hydrocortisone (ANUSOL-HC) 25 MG suppository   Rectal   Place 25 mg rectally daily as needed for hemorrhoids.  BP 124/90  Pulse 69  Temp(Src) 97.6 F (36.4 C) (Oral)  Resp 18  SpO2 99%  Physical Exam  Nursing note and vitals reviewed. Constitutional: He is oriented to person, place, and time. He appears well-developed and well-nourished. No distress.  HENT:  Head: Normocephalic and atraumatic.  Mouth/Throat: Oropharynx is clear and moist. No oropharyngeal exudate.  Eyes: Conjunctivae and EOM are normal. Pupils are equal, round, and reactive to light. Right eye exhibits no discharge. Left eye exhibits no discharge.  Neck: Normal range of motion. Neck supple. No tracheal deviation present.  Cardiovascular: Normal rate, regular rhythm and normal heart sounds.  Exam reveals no friction rub.   No murmur heard. Radial pulses 2+  bilaterally  Pulmonary/Chest: Effort normal and breath sounds normal. No respiratory distress. He has no wheezes. He has no rales.  Musculoskeletal: He exhibits no edema and no tenderness.  Decreased ROM to right shoulder secondary to pain Strength 5+/5+ to upper extremities bilaterally Negative swelling, effusion, erythema, warmth to touch - negative sign of infection, negative septic joint  Lymphadenopathy:    He has no cervical adenopathy.  Neurological: He is alert and oriented to person, place, and time. No cranial nerve deficit. He exhibits normal muscle tone. Coordination normal.  Sensation intact to upper extremities bilaterally with differentiation between sharp and dull touch  Skin: Skin is warm and dry. No rash noted. He is not diaphoretic. No erythema.  Psychiatric: He has a normal mood and affect. His behavior is normal. Thought content normal.    ED Course  Procedures (including critical care time)  Labs Reviewed - No data to display Dg Shoulder Right  11/08/2012  *RADIOLOGY REPORT*  Clinical Data: Right shoulder pain.  RIGHT SHOULDER - 2+ VIEW  Comparison: None.  Findings: The humerus is located and the acromioclavicular joint is intact.  There is glenohumeral and acromioclavicular degenerative change.  No fracture is identified. An old Hill-Sachs deformity is noted.  Imaged right lung and ribs appear clear.  IMPRESSION:  1.  Degenerative disease about the shoulder without acute abnormality. 2.  Old Hill-Sachs deformity compatible with prior shoulder dislocation.   Original Report Authenticated By: Holley Dexter, M.D.      1. Degenerative joint disease   2. HTN (hypertension)   3. HLD (hyperlipidemia)       MDM  No neurovascular damage noted. Imaging of right shoulder identified degenerative joint disease. Referred patient to orthopedics. Discussed with patient to continue at home medications. Discussed exercises and apply icy-hot ointment and pads. Did not discharge  patient with pain medications since he already has them and taking them - discussed precautions and restrictions while on pain medications. Discussed to monitor symptoms and if symptoms worsen or change to report back to the ED. Patient agreed to plan of care, understood all questions answered.         Raymon Mutton, PA-C 11/08/12 1757

## 2012-11-08 NOTE — ED Notes (Signed)
C/o R shoulder pain for past several months, has been taking his pain pills with no relief of pain.. Denies any injuries. Pain is constant. Cms intact

## 2012-11-09 NOTE — ED Provider Notes (Signed)
Medical screening examination/treatment/procedure(s) were performed by non-physician practitioner and as supervising physician I was immediately available for consultation/collaboration.  Mitul Hallowell R. Magen Suriano, MD 11/09/12 0907 

## 2012-11-22 ENCOUNTER — Encounter (HOSPITAL_COMMUNITY): Payer: Self-pay | Admitting: Emergency Medicine

## 2012-11-22 ENCOUNTER — Emergency Department (HOSPITAL_COMMUNITY)
Admission: EM | Admit: 2012-11-22 | Discharge: 2012-11-22 | Disposition: A | Discharge status: Home / Self Care | Payer: Medicare Other | Attending: Emergency Medicine | Admitting: Emergency Medicine

## 2012-11-22 DIAGNOSIS — Z862 Personal history of diseases of the blood and blood-forming organs and certain disorders involving the immune mechanism: Secondary | ICD-10-CM | POA: Insufficient documentation

## 2012-11-22 DIAGNOSIS — I10 Essential (primary) hypertension: Secondary | ICD-10-CM | POA: Insufficient documentation

## 2012-11-22 DIAGNOSIS — F17200 Nicotine dependence, unspecified, uncomplicated: Secondary | ICD-10-CM | POA: Insufficient documentation

## 2012-11-22 DIAGNOSIS — M25519 Pain in unspecified shoulder: Secondary | ICD-10-CM | POA: Insufficient documentation

## 2012-11-22 DIAGNOSIS — Z8709 Personal history of other diseases of the respiratory system: Secondary | ICD-10-CM | POA: Insufficient documentation

## 2012-11-22 DIAGNOSIS — M25511 Pain in right shoulder: Secondary | ICD-10-CM

## 2012-11-22 DIAGNOSIS — G8929 Other chronic pain: Secondary | ICD-10-CM | POA: Insufficient documentation

## 2012-11-22 DIAGNOSIS — Z9889 Other specified postprocedural states: Secondary | ICD-10-CM | POA: Insufficient documentation

## 2012-11-22 DIAGNOSIS — Z79899 Other long term (current) drug therapy: Secondary | ICD-10-CM | POA: Insufficient documentation

## 2012-11-22 DIAGNOSIS — Z96649 Presence of unspecified artificial hip joint: Secondary | ICD-10-CM | POA: Insufficient documentation

## 2012-11-22 DIAGNOSIS — Z8639 Personal history of other endocrine, nutritional and metabolic disease: Secondary | ICD-10-CM | POA: Insufficient documentation

## 2012-11-22 DIAGNOSIS — Z760 Encounter for issue of repeat prescription: Secondary | ICD-10-CM | POA: Insufficient documentation

## 2012-11-22 DIAGNOSIS — G4733 Obstructive sleep apnea (adult) (pediatric): Secondary | ICD-10-CM | POA: Insufficient documentation

## 2012-11-22 MED ORDER — HYDROCODONE-ACETAMINOPHEN 5-325 MG PO TABS
1.0000 | ORAL_TABLET | Freq: Once | ORAL | Status: AC
  Administered 2012-11-22: 1 via ORAL
  Filled 2012-11-22: qty 1

## 2012-11-22 MED ORDER — HYDROCODONE-ACETAMINOPHEN 5-325 MG PO TABS: ORAL_TABLET | ORAL | Status: DC

## 2012-11-22 NOTE — ED Provider Notes (Signed)
History     CSN: [id number]  Arrival date & time 11/22/12  7829   First MD Initiated Contact with Patient 11/22/12 0930      Chief Complaint  Patient presents with  . Shoulder Pain    Right shoulder    (Consider location/radiation/quality/duration/timing/severity/associated sxs/prior treatment) HPI Comments: Patient presents with complaint of right shoulder pain for the past 2 weeks but is persistent. He is now out of pain medication and is requesting refill until his appointment with Dr. Ophelia Charter in 5 days. Patient had back surgery and hip replacement done by the same physician in the past. Patient denies weakness, numbness, or tingling in his right arm. He states that the pain is worse with lifting his arm above shoulder level. It is improved with Vicodin. Onset of symptoms acute. Course is constant.  Patient is a 57 y.o. male presenting with shoulder pain. The history is provided by the patient.  Shoulder Pain Associated symptoms include arthralgias. Pertinent negatives include no joint swelling, neck pain, numbness or weakness.    Past Medical History  Diagnosis Date  . Hypertension   . Hyperlipidemia   . OSA (obstructive sleep apnea) 12/09/2011    PSG 01/03/12>>AHI 45.7, SpO2 low 73%, PLMI 0.  CPAP 15 cm H2O>>AHI 0, +R.   Marland Kitchen Bronchitis     Past Surgical History  Procedure Laterality Date  . Joint replacement    . Back surgery    . Total hip arthroplasty      Family History  Problem Relation Age of Onset  . Heart attack Father   . Cancer Mother     History  Substance Use Topics  . Smoking status: Current Some Day Smoker -- 0.50 packs/day for 39 years    Types: Cigarettes  . Smokeless tobacco: Not on file     Comment: states on and off smoker  . Alcohol Use: No      Review of Systems  Constitutional: Negative for activity change.  HENT: Negative for neck pain.   Musculoskeletal: Positive for arthralgias. Negative for back pain, joint swelling and gait problem.    Skin: Negative for wound.  Neurological: Negative for weakness and numbness.    Allergies  Review of patient's allergies indicates no known allergies.  Home Medications   Current Outpatient Rx  Name  Route  Sig  Dispense  Refill  . HYDROcodone-acetaminophen (LORTAB) 10-500 MG per tablet   Oral   Take 1 tablet by mouth every 6 (six) hours as needed for pain. For pain         . lisinopril (PRINIVIL,ZESTRIL) 10 MG tablet   Oral   Take 10 mg by mouth every morning.          Marland Kitchen albuterol (PROVENTIL HFA;VENTOLIN HFA) 108 (90 BASE) MCG/ACT inhaler   Inhalation   Inhale 2 puffs into the lungs every 6 (six) hours as needed for wheezing or shortness of breath.         Marland Kitchen HYDROcodone-acetaminophen (NORCO/VICODIN) 5-325 MG per tablet      Take 1-2 tablets every 6 hours as needed for severe pain   10 tablet   0   . Ipratropium-Albuterol (COMBIVENT RESPIMAT) 20-100 MCG/ACT AERS respimat   Inhalation   Inhale 1 puff into the lungs every 6 (six) hours.   1 Inhaler   5     BP 149/84  Pulse 76  Temp(Src) 97 F (36.1 C) (Oral)  SpO2 97%  Physical Exam  Nursing note and vitals reviewed. Constitutional: He  appears well-developed and well-nourished.  HENT:  Head: Normocephalic and atraumatic.  Eyes: Conjunctivae are normal.  Neck: Normal range of motion. Neck supple.  Cardiovascular: Normal pulses.   Pulses:      Radial pulses are 2+ on the right side, and 2+ on the left side.  Musculoskeletal: He exhibits tenderness. He exhibits no edema.       Right shoulder: He exhibits tenderness. He exhibits normal range of motion, no bony tenderness, no swelling and no effusion.       Arms: Neurological: He is alert. No sensory deficit.  Motor, sensation, and vascular distal to the injury is fully intact.   Skin: Skin is warm and dry.  Psychiatric: He has a normal mood and affect.    ED Course  Procedures (including critical care time)  Labs Reviewed - No data to display No  results found.   1. Shoulder pain, acute, right    10:37 AM Patient seen and examined. Previous x-ray reviewed. Medications ordered.   Vital signs reviewed and are as follows: Filed Vitals:   11/22/12 0934  BP: 149/84  Pulse: 76  Temp: 97 F (36.1 C)   Pt to followup with his orthopedist in 5 days.  Patient counseled on use of narcotic pain medications. Counseled not to combine these medications with others containing tylenol. Urged not to drink alcohol, drive, or perform any other activities that requires focus while taking these medications. The patient verbalizes understanding and agrees with the plan.   MDM  Persistent right shoulder pain, patient is out of pain medication. Pain is reproducible with movement. He has followup in 5 days. No neurovascular compromise.        Renne Crigler, PA-C 11/22/12 1040

## 2012-11-22 NOTE — ED Provider Notes (Signed)
Medical screening examination/treatment/procedure(s) were performed by non-physician practitioner and as supervising physician I was immediately available for consultation/collaboration.   Billye Nydam, MD 11/22/12 1500 

## 2012-11-22 NOTE — ED Notes (Signed)
Pt reports right shoulder pain for months. Pt seen here on the 8th and has copies of his xrays with him. Pt took Hydrocodone 10 mg last night. Pt has appointment with his Dr. on the 3rd of June. Pt is requesting a refill on pain medication.

## 2013-05-10 ENCOUNTER — Emergency Department (INDEPENDENT_AMBULATORY_CARE_PROVIDER_SITE_OTHER)
Admission: EM | Admit: 2013-05-10 | Discharge: 2013-05-10 | Disposition: A | Discharge status: Home / Self Care | Payer: Medicare Other | Source: Home / Self Care

## 2013-05-10 ENCOUNTER — Encounter (HOSPITAL_COMMUNITY): Payer: Self-pay | Admitting: Emergency Medicine

## 2013-05-10 DIAGNOSIS — M1712 Unilateral primary osteoarthritis, left knee: Secondary | ICD-10-CM

## 2013-05-10 DIAGNOSIS — M1710 Unilateral primary osteoarthritis, unspecified knee: Secondary | ICD-10-CM

## 2013-05-10 MED ORDER — TRIAMCINOLONE ACETONIDE 40 MG/ML IJ SUSP
INTRAMUSCULAR | Status: AC
  Filled 2013-05-10: qty 2

## 2013-05-10 NOTE — ED Notes (Signed)
Seen by Hayden Rasmussen, np prior to this nurse

## 2013-05-10 NOTE — ED Provider Notes (Signed)
Medical screening examination/treatment/procedure(s) were performed by a resident physician or non-physician practitioner and as the supervising physician I was immediately available for consultation/collaboration.  Chayne Baumgart, MD    Eldin Bonsell S Brick Ketcher, MD 05/10/13 2118 

## 2013-05-10 NOTE — ED Provider Notes (Signed)
CSN: [id number]     Arrival date & time 05/10/13  1406 History   First MD Initiated Contact with Patient 05/10/13 1607     Chief Complaint  Patient presents with  . Knee Pain   (Consider location/radiation/quality/duration/timing/severity/associated sxs/prior Treatment) HPI Comments: 57 year old male with a history of degenerative joint disease of the left knee has been experiencing increased pain to the left knee for the past 2 weeks. He is taking Norco prescribed by the pain clinic and he also has a orthopedist. Pain medicine is not relieving the pain anymore.   Past Medical History  Diagnosis Date  . Hypertension   . Hyperlipidemia   . OSA (obstructive sleep apnea) 12/09/2011    PSG 01/03/12>>AHI 45.7, SpO2 low 73%, PLMI 0.  CPAP 15 cm H2O>>AHI 0, +R.   Marland Kitchen Bronchitis    Past Surgical History  Procedure Laterality Date  . Joint replacement    . Back surgery    . Total hip arthroplasty     Family History  Problem Relation Age of Onset  . Heart attack Father   . Cancer Mother    History  Substance Use Topics  . Smoking status: Current Some Day Smoker -- 0.50 packs/day for 39 years    Types: Cigarettes  . Smokeless tobacco: Not on file     Comment: states on and off smoker  . Alcohol Use: No    Review of Systems  Constitutional: Negative.   Respiratory: Negative.   Gastrointestinal: Negative.   Genitourinary: Negative.   Musculoskeletal: Positive for arthralgias.       As per HPI The patient points to the medial and lateral joint line as the source of pain.  Skin: Negative.   Neurological: Negative for dizziness, weakness, numbness and headaches.    Allergies  Review of patient's allergies indicates no known allergies.  Home Medications   Current Outpatient Rx  Name  Route  Sig  Dispense  Refill  . albuterol (PROVENTIL HFA;VENTOLIN HFA) 108 (90 BASE) MCG/ACT inhaler   Inhalation   Inhale 2 puffs into the lungs every 6 (six) hours as needed for wheezing or  shortness of breath.         Marland Kitchen HYDROcodone-acetaminophen (LORTAB) 10-500 MG per tablet   Oral   Take 1 tablet by mouth every 6 (six) hours as needed for pain. For pain         . HYDROcodone-acetaminophen (NORCO/VICODIN) 5-325 MG per tablet      Take 1-2 tablets every 6 hours as needed for severe pain   10 tablet   0   . Ipratropium-Albuterol (COMBIVENT RESPIMAT) 20-100 MCG/ACT AERS respimat   Inhalation   Inhale 1 puff into the lungs every 6 (six) hours.   1 Inhaler   5   . lisinopril (PRINIVIL,ZESTRIL) 10 MG tablet   Oral   Take 10 mg by mouth every morning.           BP 122/76  Pulse 63  Temp(Src) 97.7 F (36.5 C) (Oral)  Resp 24  SpO2 95% Physical Exam  Nursing note and vitals reviewed. Constitutional: He is oriented to person, place, and time. He appears well-developed and well-nourished.  HENT:  Head: Normocephalic and atraumatic.  Eyes: EOM are normal. Left eye exhibits no discharge.  Neck: Normal range of motion. Neck supple.  Musculoskeletal:  No obvious swelling or deformities. No bulging of the medial or lateral joint spaces. Palpation of the medial and lateral joint lines are tender. No erythema. Extension 280.  Flexion to 110.  Neurological: He is alert and oriented to person, place, and time. No cranial nerve deficit.  Skin: Skin is warm and dry.  Psychiatric: He has a normal mood and affect.    ED Course  Injection of joint Date/Time: 05/10/2013 4:15 PM Performed by: Phineas Real, Telvin Reinders Authorized by: Clementeen Graham, S Consent: Verbal consent obtained. Risks and benefits: risks, benefits and alternatives were discussed Patient understanding: patient states understanding of the procedure being performed Patient identity confirmed: verbally with patient Preparation: Patient was prepped and draped in the usual sterile fashion. Local anesthesia used: no Patient sedated: no Patient tolerance: Patient tolerated the procedure well with no immediate  complications. Comments: Medial and lateral L knee joint space injected with Kenalog 2 cc, Lidocaine 2% 3cc and marcaine 1cc.   (including critical care time) Labs Review Labs Reviewed - No data to display Imaging Review No results found.    MDM   1. Left knee DJD      Knee joint injection with marcaine ank, lido and kenalog Cont your Norco and Keep appointment with  Ortho next week  Hayden Rasmussen, NP 05/10/13 1635

## 2013-07-04 DIAGNOSIS — J189 Pneumonia, unspecified organism: Secondary | ICD-10-CM

## 2013-07-04 HISTORY — DX: Pneumonia, unspecified organism: J18.9

## 2013-08-21 ENCOUNTER — Other Ambulatory Visit: Payer: Self-pay | Admitting: Orthopaedic Surgery

## 2013-08-21 DIAGNOSIS — M545 Low back pain, unspecified: Secondary | ICD-10-CM

## 2013-08-28 ENCOUNTER — Ambulatory Visit
Admission: RE | Admit: 2013-08-28 | Discharge: 2013-08-28 | Disposition: A | Discharge status: Home / Self Care | Payer: Medicare Other | Source: Ambulatory Visit | Attending: Orthopaedic Surgery | Admitting: Orthopaedic Surgery

## 2013-08-28 DIAGNOSIS — M545 Low back pain, unspecified: Secondary | ICD-10-CM

## 2014-02-18 ENCOUNTER — Encounter (HOSPITAL_COMMUNITY): Payer: Self-pay | Admitting: Emergency Medicine

## 2014-02-18 ENCOUNTER — Emergency Department (HOSPITAL_COMMUNITY)
Admission: EM | Admit: 2014-02-18 | Discharge: 2014-02-18 | Disposition: A | Discharge status: Home / Self Care | Payer: Medicare Other | Attending: Emergency Medicine | Admitting: Emergency Medicine

## 2014-02-18 ENCOUNTER — Emergency Department (HOSPITAL_COMMUNITY): Payer: Medicare Other

## 2014-02-18 DIAGNOSIS — S79929A Unspecified injury of unspecified thigh, initial encounter: Secondary | ICD-10-CM

## 2014-02-18 DIAGNOSIS — Z8709 Personal history of other diseases of the respiratory system: Secondary | ICD-10-CM | POA: Diagnosis not present

## 2014-02-18 DIAGNOSIS — S24101A Unspecified injury at T1 level of thoracic spinal cord, initial encounter: Secondary | ICD-10-CM | POA: Diagnosis not present

## 2014-02-18 DIAGNOSIS — Y92410 Unspecified street and highway as the place of occurrence of the external cause: Secondary | ICD-10-CM | POA: Diagnosis not present

## 2014-02-18 DIAGNOSIS — I10 Essential (primary) hypertension: Secondary | ICD-10-CM | POA: Diagnosis not present

## 2014-02-18 DIAGNOSIS — F17200 Nicotine dependence, unspecified, uncomplicated: Secondary | ICD-10-CM | POA: Diagnosis not present

## 2014-02-18 DIAGNOSIS — Z79899 Other long term (current) drug therapy: Secondary | ICD-10-CM | POA: Insufficient documentation

## 2014-02-18 DIAGNOSIS — S298XXA Other specified injuries of thorax, initial encounter: Secondary | ICD-10-CM | POA: Diagnosis not present

## 2014-02-18 DIAGNOSIS — Z8639 Personal history of other endocrine, nutritional and metabolic disease: Secondary | ICD-10-CM | POA: Insufficient documentation

## 2014-02-18 DIAGNOSIS — IMO0002 Reserved for concepts with insufficient information to code with codable children: Secondary | ICD-10-CM | POA: Diagnosis present

## 2014-02-18 DIAGNOSIS — S79919A Unspecified injury of unspecified hip, initial encounter: Secondary | ICD-10-CM | POA: Diagnosis not present

## 2014-02-18 DIAGNOSIS — Y9389 Activity, other specified: Secondary | ICD-10-CM | POA: Diagnosis not present

## 2014-02-18 DIAGNOSIS — M545 Low back pain, unspecified: Secondary | ICD-10-CM

## 2014-02-18 DIAGNOSIS — Z862 Personal history of diseases of the blood and blood-forming organs and certain disorders involving the immune mechanism: Secondary | ICD-10-CM | POA: Diagnosis not present

## 2014-02-18 MED ORDER — HYDROMORPHONE HCL PF 2 MG/ML IJ SOLN
2.0000 mg | Freq: Once | INTRAMUSCULAR | Status: AC
  Administered 2014-02-18: 2 mg via INTRAMUSCULAR
  Filled 2014-02-18: qty 1
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
## 2014-02-18 NOTE — ED Notes (Signed)
Patient transported to X-ray 

## 2014-02-18 NOTE — ED Notes (Signed)
Advised patient to follow up with PCP regarding high blood pressure. Ambulatory with steady gait. Patient is not driving home. No other questions/concerns. Neurologically intact. Moving all extremities equally.
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
## 2014-03-25 ENCOUNTER — Emergency Department (HOSPITAL_COMMUNITY)
Admission: EM | Admit: 2014-03-25 | Discharge: 2014-03-25 | Disposition: A | Discharge status: Home / Self Care | Payer: Medicare Other | Attending: Emergency Medicine | Admitting: Emergency Medicine

## 2014-03-25 ENCOUNTER — Encounter (HOSPITAL_COMMUNITY): Payer: Self-pay | Admitting: Emergency Medicine

## 2014-03-25 DIAGNOSIS — X58XXXA Exposure to other specified factors, initial encounter: Secondary | ICD-10-CM | POA: Diagnosis not present

## 2014-03-25 DIAGNOSIS — M545 Low back pain, unspecified: Secondary | ICD-10-CM | POA: Insufficient documentation

## 2014-03-25 DIAGNOSIS — Z9981 Dependence on supplemental oxygen: Secondary | ICD-10-CM | POA: Insufficient documentation

## 2014-03-25 DIAGNOSIS — Y929 Unspecified place or not applicable: Secondary | ICD-10-CM | POA: Insufficient documentation

## 2014-03-25 DIAGNOSIS — Y939 Activity, unspecified: Secondary | ICD-10-CM | POA: Insufficient documentation

## 2014-03-25 DIAGNOSIS — I10 Essential (primary) hypertension: Secondary | ICD-10-CM | POA: Diagnosis not present

## 2014-03-25 DIAGNOSIS — Z8709 Personal history of other diseases of the respiratory system: Secondary | ICD-10-CM | POA: Insufficient documentation

## 2014-03-25 DIAGNOSIS — G4733 Obstructive sleep apnea (adult) (pediatric): Secondary | ICD-10-CM | POA: Insufficient documentation

## 2014-03-25 DIAGNOSIS — F17200 Nicotine dependence, unspecified, uncomplicated: Secondary | ICD-10-CM | POA: Insufficient documentation

## 2014-03-25 DIAGNOSIS — G8911 Acute pain due to trauma: Secondary | ICD-10-CM | POA: Insufficient documentation

## 2014-03-25 DIAGNOSIS — R229 Localized swelling, mass and lump, unspecified: Secondary | ICD-10-CM | POA: Diagnosis not present

## 2014-03-25 DIAGNOSIS — S139XXA Sprain of joints and ligaments of unspecified parts of neck, initial encounter: Secondary | ICD-10-CM | POA: Diagnosis not present

## 2014-03-25 DIAGNOSIS — Z79899 Other long term (current) drug therapy: Secondary | ICD-10-CM | POA: Insufficient documentation

## 2014-03-25 DIAGNOSIS — Z862 Personal history of diseases of the blood and blood-forming organs and certain disorders involving the immune mechanism: Secondary | ICD-10-CM | POA: Insufficient documentation

## 2014-03-25 DIAGNOSIS — Z8639 Personal history of other endocrine, nutritional and metabolic disease: Secondary | ICD-10-CM | POA: Insufficient documentation

## 2014-03-25 MED ORDER — CYCLOBENZAPRINE HCL 10 MG PO TABS: 10.0000 mg | ORAL_TABLET | Freq: Two times a day (BID) | ORAL | Status: DC | PRN

## 2014-03-25 MED ORDER — NAPROXEN 375 MG PO TABS: 375.0000 mg | ORAL_TABLET | Freq: Two times a day (BID) | ORAL | Status: DC

## 2014-03-25 NOTE — ED Provider Notes (Signed)
Medical screening examination/treatment/procedure(s) were performed by non-physician practitioner and as supervising physician I was immediately available for consultation/collaboration.     Veryl Speak, MD 03/25/14 [id number]
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
## 2014-03-25 NOTE — Discharge Instructions (Signed)
Back Pain, Adult °Low back pain is very common. About 1 in 5 people have back pain. The cause of low back pain is rarely dangerous. The pain often gets better over time. About half of people with a sudden onset of back pain feel better in just 2 weeks. About 8 in 10 people feel better by 6 weeks.  °CAUSES °Some common causes of back pain include: °· Strain of the muscles or ligaments supporting the spine. °· Wear and tear (degeneration) of the spinal discs. °· Arthritis. °· Direct injury to the back. °DIAGNOSIS °Most of the time, the direct cause of low back pain is not known. However, back pain can be treated effectively even when the exact cause of the pain is unknown. Answering your caregiver's questions about your overall health and symptoms is one of the most accurate ways to make sure the cause of your pain is not dangerous. If your caregiver needs more information, he or she may order lab work or imaging tests (X-rays or MRIs). However, even if imaging tests show changes in your back, this usually does not require surgery. °HOME CARE INSTRUCTIONS °For many people, back pain returns. Since low back pain is rarely dangerous, it is often a condition that people can learn to manage on their own.  °· Remain active. It is stressful on the back to sit or stand in one place. Do not sit, drive, or stand in one place for more than 30 minutes at a time. Take short walks on level surfaces as soon as pain allows. Try to increase the length of time you walk each day. °· Do not stay in bed. Resting more than 1 or 2 days can delay your recovery. °· Do not avoid exercise or work. Your body is made to move. It is not dangerous to be active, even though your back may hurt. Your back will likely heal faster if you return to being active before your pain is gone. °· Pay attention to your body when you  bend and lift. Many people have less discomfort when lifting if they bend their knees, keep the load close to their bodies, and  avoid twisting. Often, the most comfortable positions are those that put less stress on your recovering back. °· Find a comfortable position to sleep. Use a firm mattress and lie on your side with your knees slightly bent. If you lie on your back, put a pillow under your knees. °· Only take over-the-counter or prescription medicines as directed by your caregiver. Over-the-counter medicines to reduce pain and inflammation are often the most helpful. Your caregiver may prescribe muscle relaxant drugs. These medicines help dull your pain so you can more quickly return to your normal activities and healthy exercise. °· Put ice on the injured area. °¨ Put ice in a plastic bag. °¨ Place a towel between your skin and the bag. °¨ Leave the ice on for 15-20 minutes, 03-04 times a day for the first 2 to 3 days. After that, ice and heat may be alternated to reduce pain and spasms. °· Ask your caregiver about trying back exercises and gentle massage. This may be of some benefit. °· Avoid feeling anxious or stressed. Stress increases muscle tension and can worsen back pain. It is important to recognize when you are anxious or stressed and learn ways to manage it. Exercise is a great option. °SEEK MEDICAL CARE IF: °· You have pain that is not relieved with rest or medicine. °· You have pain that does not improve in 1 week. °· You have new symptoms. °· You are generally not feeling well. °SEEK   IMMEDIATE MEDICAL CARE IF:   You have pain that radiates from your back into your legs.  You develop new bowel or bladder control problems.  You have unusual weakness or numbness in your arms or legs.  You develop nausea or vomiting.  You develop abdominal pain.  You feel faint. Document Released: 06/20/2005 Document Revised: 12/20/2011 Document Reviewed: 10/22/2013 [facility] Patient Information 2015 [place], [place]. This information is not intended to replace advice given to you by your health care provider. Make sure you  discuss any questions you have with your health care provider.  Cervical Sprain A cervical sprain is an injury in the neck in which the strong, fibrous tissues (ligaments) that connect your neck bones stretch or tear. Cervical sprains can range from mild to severe. Severe cervical sprains can cause the neck vertebrae to be unstable. This can lead to damage of the spinal cord and can result in serious nervous system problems. The amount of time it takes for a cervical sprain to get better depends on the cause and extent of the injury. Most cervical sprains heal in 1 to 3 weeks. CAUSES  Severe cervical sprains may be caused by:   Contact sport injuries (such as from football, rugby, wrestling, hockey, auto racing, gymnastics, diving, martial arts, or boxing).   Motor vehicle collisions.   Whiplash injuries. This is an injury from a sudden forward and backward whipping movement of the head and neck.  Falls.  Mild cervical sprains may be caused by:   Being in an awkward position, such as while cradling a telephone between your ear and shoulder.   Sitting in a chair that does not offer proper support.   Working at a poorly Landscape architect station.   Looking up or down for long periods of time.  SYMPTOMS   Pain, soreness, stiffness, or a burning sensation in the front, back, or sides of the neck. This discomfort may develop immediately after the injury or slowly, 24 hours or more after the injury.   Pain or tenderness directly in the middle of the back of the neck.   Shoulder or upper back pain.   Limited ability to move the neck.   Headache.   Dizziness.   Weakness, numbness, or tingling in the hands or arms.   Muscle spasms.   Difficulty swallowing or chewing.   Tenderness and swelling of the neck.  DIAGNOSIS  Most of the time your health care provider can diagnose a cervical sprain by taking your history and doing a physical exam. Your health care  provider will ask about previous neck injuries and any known neck problems, such as arthritis in the neck. X-rays may be taken to find out if there are any other problems, such as with the bones of the neck. Other tests, such as a CT scan or MRI, may also be needed.  TREATMENT  Treatment depends on the severity of the cervical sprain. Mild sprains can be treated with rest, keeping the neck in place (immobilization), and pain medicines. Severe cervical sprains are immediately immobilized. Further treatment is done to help with pain, muscle spasms, and other symptoms and may include:  Medicines, such as pain relievers, numbing medicines, or muscle relaxants.   Physical therapy. This may involve stretching exercises, strengthening exercises, and posture training. Exercises and improved posture can help stabilize the neck, strengthen muscles, and help stop symptoms from returning.  HOME CARE INSTRUCTIONS   Put ice on the injured area.   Put ice in a  plastic bag.   °¨ Place a towel between your skin and the bag.   °¨ Leave the ice on for 15-20 minutes, 3-4 times a day.   °· If your injury was severe, you may have been given a cervical collar to wear. A cervical collar is a two-piece collar designed to keep your neck from moving while it heals. °¨ Do not remove the collar unless instructed by your health care provider. °¨ If you have long hair, keep it outside of the collar. °¨ Ask your health care provider before making any adjustments to your collar. Minor adjustments may be required over time to improve comfort and reduce pressure on your chin or on the back of your head. °¨ If you are allowed to remove the collar for cleaning or bathing, follow your health care provider's instructions on how to do so safely. °¨ Keep your collar clean by wiping it with mild soap and water and drying it completely. If the collar you have been given includes removable pads, remove them every 1-2 days and hand wash them with  soap and water. Allow them to air dry. They should be completely dry before you wear them in the collar. °¨ If you are allowed to remove the collar for cleaning and bathing, wash and dry the skin of your neck. Check your skin for irritation or sores. If you see any, tell your health care provider. °¨ Do not drive while wearing the collar.   °· Only take over-the-counter or prescription medicines for pain, discomfort, or fever as directed by your health care provider.   °· Keep all follow-up appointments as directed by your health care provider.   °· Keep all physical therapy appointments as directed by your health care provider.   °· Make any needed adjustments to your workstation to promote good posture.   °· Avoid positions and activities that make your symptoms worse.   °· Warm up and stretch before being active to help prevent problems.   °SEEK MEDICAL CARE IF:  °· Your pain is not controlled with medicine.   °· You are unable to decrease your pain medicine over time as planned.   °· Your activity level is not improving as expected.   °SEEK IMMEDIATE MEDICAL CARE IF:  °· You develop any bleeding. °· You develop stomach upset. °· You have signs of an allergic reaction to your medicine.   °· Your symptoms get worse.   °· You develop new, unexplained symptoms.   °· You have numbness, tingling, weakness, or paralysis in any part of your body.   °MAKE SURE YOU:  °· Understand these instructions. °· Will watch your condition. °· Will get help right away if you are not doing well or get worse. °Document Released: 04/17/2007 Document Revised: 06/25/2013 Document Reviewed: 12/26/2012 °[employer]® Patient Information ©2015 [employer]. This information is not intended to replace advice given to you by your health care provider. Make sure you discuss any questions you have with your health care provider. ° °
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
## 2014-04-17 ENCOUNTER — Emergency Department (HOSPITAL_COMMUNITY): Payer: Medicare Other

## 2014-04-17 ENCOUNTER — Emergency Department (HOSPITAL_COMMUNITY)
Admission: EM | Admit: 2014-04-17 | Discharge: 2014-04-17 | Disposition: A | Discharge status: Home / Self Care | Payer: Medicare Other | Attending: Emergency Medicine | Admitting: Emergency Medicine

## 2014-04-17 ENCOUNTER — Encounter (HOSPITAL_COMMUNITY): Payer: Self-pay | Admitting: Emergency Medicine

## 2014-04-17 DIAGNOSIS — Z720 Tobacco use: Secondary | ICD-10-CM | POA: Diagnosis not present

## 2014-04-17 DIAGNOSIS — Z79899 Other long term (current) drug therapy: Secondary | ICD-10-CM | POA: Diagnosis not present

## 2014-04-17 DIAGNOSIS — Z9981 Dependence on supplemental oxygen: Secondary | ICD-10-CM | POA: Diagnosis not present

## 2014-04-17 DIAGNOSIS — I10 Essential (primary) hypertension: Secondary | ICD-10-CM | POA: Diagnosis not present

## 2014-04-17 DIAGNOSIS — R0683 Snoring: Secondary | ICD-10-CM | POA: Insufficient documentation

## 2014-04-17 DIAGNOSIS — R05 Cough: Secondary | ICD-10-CM | POA: Diagnosis present

## 2014-04-17 DIAGNOSIS — Z8639 Personal history of other endocrine, nutritional and metabolic disease: Secondary | ICD-10-CM | POA: Insufficient documentation

## 2014-04-17 DIAGNOSIS — J069 Acute upper respiratory infection, unspecified: Secondary | ICD-10-CM | POA: Diagnosis not present

## 2014-04-17 DIAGNOSIS — Z791 Long term (current) use of non-steroidal anti-inflammatories (NSAID): Secondary | ICD-10-CM | POA: Insufficient documentation

## 2014-04-17 MED ORDER — ALBUTEROL SULFATE HFA 108 (90 BASE) MCG/ACT IN AERS: 1.0000 | INHALATION_SPRAY | Freq: Four times a day (QID) | RESPIRATORY_TRACT | Status: DC | PRN

## 2014-04-17 MED ORDER — GUAIFENESIN-CODEINE 100-10 MG/5ML PO SOLN: 5.0000 mL | Freq: Three times a day (TID) | ORAL | Status: DC | PRN

## 2014-04-17 MED ORDER — ACETAMINOPHEN 325 MG PO TABS: 650.0000 mg | ORAL_TABLET | Freq: Once | ORAL | Status: DC

## 2014-04-17 NOTE — ED Notes (Addendum)
Presents with one week of productive cough-yellow in color and pain in side with cough. Breath sounds clear. Denies fevers.  SAts 97%.

## 2014-04-17 NOTE — ED Provider Notes (Signed)
CSN: [id number]     Arrival date & time 04/17/14  1600 History  This chart was scribed for a non-physician practitioner, Hyman Bible, PA-C working with Mirna Mires, MD by Martinique Peace, ED Scribe. The patient was seen in TR10C/TR10C. The patient's care was started at 7:44 PM.    Chief Complaint  Patient presents with  . Cough      Patient is a 58 y.o. male presenting with cough. The history is provided by the patient. No language interpreter was used.  Cough Associated symptoms: shortness of breath (mild)   Associated symptoms: no chest pain, no fever and no rhinorrhea    HPI Comments: Peter Manning is a 58 y.o. male who presents to the Emergency Department complaining of productive cough (yellow, green and clear sputum) with mild associated SOB x 5 days. Pt denies fever, congestion, rhinorrhea, or chest pain. Pt has tried taking Mucinex without relief. He states that he was using albuterol inhaler previously, but lost it when he moved. Pt is current smoker.   Past Medical History  Diagnosis Date  . Hypertension   . Hyperlipidemia   . OSA (obstructive sleep apnea) 12/09/2011    PSG 01/03/12>>AHI 45.7, SpO2 low 73%, PLMI 0.  CPAP 15 cm H2O>>AHI 0, +R.   Marland Kitchen Bronchitis    Past Surgical History  Procedure Laterality Date  . Joint replacement    . Back surgery    . Total hip arthroplasty     Family History  Problem Relation Age of Onset  . Heart attack Father   . Cancer Mother    History  Substance Use Topics  . Smoking status: Current Some Day Smoker -- 0.50 packs/day for 39 years    Types: Cigarettes  . Smokeless tobacco: Not on file     Comment: states on and off smoker  . Alcohol Use: No    Review of Systems  Constitutional: Negative for fever.  HENT: Negative for congestion and rhinorrhea.   Respiratory: Positive for cough and shortness of breath (mild).   Cardiovascular: Negative for chest pain.      Allergies  Review of patient's allergies indicates no  known allergies.  Home Medications   Prior to Admission medications   Medication Sig Start Date End Date Taking? Authorizing Provider  albuterol (PROVENTIL HFA;VENTOLIN HFA) 108 (90 BASE) MCG/ACT inhaler Inhale 2 puffs into the lungs every 6 (six) hours as needed for wheezing or shortness of breath.   Yes Historical Provider, MD  cyclobenzaprine (FLEXERIL) 10 MG tablet Take 1 tablet (10 mg total) by mouth 2 (two) times daily as needed for muscle spasms. 03/25/14  Yes Tiffany Marilu Favre, PA-C  HYDROcodone-acetaminophen (NORCO) 10-325 MG per tablet Take 1 tablet by mouth every 4 (four) hours as needed for moderate pain.   Yes Historical Provider, MD  lisinopril (PRINIVIL,ZESTRIL) 10 MG tablet Take 10 mg by mouth every morning.    Yes Historical Provider, MD  naproxen (NAPROSYN) 375 MG tablet Take 1 tablet (375 mg total) by mouth 2 (two) times daily. 03/25/14  Yes Tiffany Marilu Favre, PA-C  oxyCODONE-acetaminophen (PERCOCET) 10-325 MG per tablet Take 1 tablet by mouth 3 (three) times daily as needed for pain.  03/06/14  Yes Historical Provider, MD   BP 131/74  Pulse 67  Temp(Src) 98.2 F (36.8 C) (Oral)  Resp 16  Ht 5\' 8"  (1.727 m)  Wt 243 lb (110.224 kg)  BMI 36.96 kg/m2  SpO2 97% Physical Exam  Nursing note and vitals reviewed. Constitutional:  He is oriented to person, place, and time. He appears well-developed and well-nourished. No distress.  HENT:  Head: Normocephalic and atraumatic.  Right Ear: Tympanic membrane and ear canal normal.  Left Ear: Tympanic membrane and ear canal normal.  Mouth/Throat: Oropharynx is clear and moist.  Eyes: Conjunctivae and EOM are normal.  Neck: Normal range of motion. Neck supple. No tracheal deviation present.  Cardiovascular: Normal rate, regular rhythm and normal heart sounds.   Pulmonary/Chest: Effort normal and breath sounds normal. No respiratory distress.  Musculoskeletal: Normal range of motion. He exhibits no edema.  No LE edema.   Neurological:  He is alert and oriented to person, place, and time.  Skin: Skin is warm and dry.  Psychiatric: He has a normal mood and affect. His behavior is normal.    ED Course  Procedures (including critical care time) Labs Review Labs Reviewed - No data to display  Imaging Review Dg Chest 2 View  04/17/2014   CLINICAL DATA:  Productive cough  EXAM: CHEST  2 VIEW  COMPARISON:  02/18/2014  FINDINGS: Borderline cardiomegaly. Normal vascularity. Clear lungs. No pleural effusion. No pneumothorax.  IMPRESSION: Borderline cardiomegaly without decompensation.   Electronically Signed   By: Maryclare Bean M.D.   On: 04/17/2014 17:26     EKG Interpretation None     Medications - No data to display  7:47 PM- Treatment plan was discussed with patient who verbalizes understanding and agrees.   MDM   Final diagnoses:  None   Pt CXR negative for acute infiltrate. Patients symptoms are consistent with URI, likely viral etiology. Discussed that antibiotics are not indicated for viral infections. Patient given Albuterol inhaler. Pt will be discharged with symptomatic treatment.  Verbalizes understanding and is agreeable with plan. Pt is hemodynamically stable & in NAD prior to dc.  Patient stable for discharge.  Return precautions given.     I personally performed the services described in this documentation, which was scribed in my presence. The recorded information has been reviewed and is accurate.   Hyman Bible, PA-C 04/18/14 0201

## 2014-04-17 NOTE — Discharge Instructions (Signed)

## 2014-04-19 NOTE — ED Provider Notes (Signed)
Medical screening examination/treatment/procedure(s) were performed by non-physician practitioner and as supervising physician I was immediately available for consultation/collaboration.     Graylon Amory E Afra Tricarico, MD 04/19/14 1214 

## 2014-05-06 ENCOUNTER — Emergency Department (INDEPENDENT_AMBULATORY_CARE_PROVIDER_SITE_OTHER)
Admission: EM | Admit: 2014-05-06 | Discharge: 2014-05-06 | Disposition: A | Discharge status: Home / Self Care | Payer: Medicare Other | Source: Home / Self Care | Attending: Family Medicine | Admitting: Family Medicine

## 2014-05-06 ENCOUNTER — Ambulatory Visit (HOSPITAL_COMMUNITY): Payer: Medicare Other | Attending: Family Medicine

## 2014-05-06 ENCOUNTER — Encounter (HOSPITAL_COMMUNITY): Payer: Self-pay | Admitting: Emergency Medicine

## 2014-05-06 DIAGNOSIS — R05 Cough: Secondary | ICD-10-CM

## 2014-05-06 DIAGNOSIS — R059 Cough, unspecified: Secondary | ICD-10-CM

## 2014-05-06 DIAGNOSIS — J189 Pneumonia, unspecified organism: Secondary | ICD-10-CM

## 2014-05-06 DIAGNOSIS — R062 Wheezing: Secondary | ICD-10-CM | POA: Insufficient documentation

## 2014-05-06 MED ORDER — CEFTRIAXONE SODIUM 1 G IJ SOLR
INTRAMUSCULAR | Status: AC
  Filled 2014-05-06: qty 10

## 2014-05-06 MED ORDER — AZITHROMYCIN 250 MG PO TABS: 250.0000 mg | ORAL_TABLET | Freq: Every day | ORAL | Status: DC

## 2014-05-06 MED ORDER — METHYLPREDNISOLONE SODIUM SUCC 125 MG IJ SOLR
INTRAMUSCULAR | Status: AC
  Filled 2014-05-06: qty 2

## 2014-05-06 MED ORDER — METHYLPREDNISOLONE SODIUM SUCC 125 MG IJ SOLR
125.0000 mg | Freq: Once | INTRAMUSCULAR | Status: AC
  Administered 2014-05-06: 125 mg via INTRAMUSCULAR

## 2014-05-06 MED ORDER — IPRATROPIUM-ALBUTEROL 0.5-2.5 (3) MG/3ML IN SOLN
RESPIRATORY_TRACT | Status: AC
  Filled 2014-05-06: qty 3

## 2014-05-06 MED ORDER — IPRATROPIUM-ALBUTEROL 0.5-2.5 (3) MG/3ML IN SOLN
3.0000 mL | Freq: Once | RESPIRATORY_TRACT | Status: AC
  Administered 2014-05-06: 3 mL via RESPIRATORY_TRACT

## 2014-05-06 MED ORDER — CEFTRIAXONE SODIUM 1 G IJ SOLR
1.0000 g | Freq: Once | INTRAMUSCULAR | Status: AC
  Administered 2014-05-06: 1 g via INTRAMUSCULAR

## 2014-05-06 MED ORDER — LIDOCAINE HCL (PF) 2 % IJ SOLN
INTRAMUSCULAR | Status: AC
  Filled 2014-05-06: qty 2

## 2014-05-06 MED ORDER — METHYLPREDNISOLONE SODIUM SUCC 125 MG IJ SOLR: 125.0000 mg | Freq: Once | INTRAMUSCULAR | Status: DC

## 2014-05-06 NOTE — ED Notes (Signed)
Pt states that he has had a cough for 3 weeks with no relief. Pt states he cant sleep from coughing spells through out the night. Pt is in no acute distress at this time.

## 2014-05-06 NOTE — ED Provider Notes (Signed)
CSN: [id number]     Arrival date & time 05/06/14  1734 History   First MD Initiated Contact with Patient 05/06/14 1750     Chief Complaint  Patient presents with  . Cough   (Consider location/radiation/quality/duration/timing/severity/associated sxs/prior Treatment) HPI  Cough: started 3 wks ago. Seen at the [facility] and told he had a viral URI. Getting worse. Now w/ fever, productive cough, runny nose, sinus pain and pressure and HA. Quit smoking on 04/17/14. Unable to afford inhaler prescribed. Just picked up cough medicine yesterday due to $15 which did not help. Denies CP, weight loss, syncope. No TB contacts.    Past Medical History  Diagnosis Date  . Hypertension   . Hyperlipidemia   . OSA (obstructive sleep apnea) 12/09/2011    PSG 01/03/12>>AHI 45.7, SpO2 low 73%, PLMI 0.  CPAP 15 cm H2O>>AHI 0, +R.   Marland Kitchen Bronchitis    Past Surgical History  Procedure Laterality Date  . Joint replacement    . Back surgery    . Total hip arthroplasty     Family History  Problem Relation Age of Onset  . Heart attack Father   . Cancer Mother    History  Substance Use Topics  . Smoking status: Current Some Day Smoker -- 0.50 packs/day for 39 years    Types: Cigarettes  . Smokeless tobacco: Not on file     Comment: states on and off smoker  . Alcohol Use: No    Review of Systems Per HPI with all other pertinent systems negative.   Allergies  Review of patient's allergies indicates no known allergies.  Home Medications   Prior to Admission medications   Medication Sig Start Date End Date Taking? Authorizing Provider  albuterol (PROVENTIL HFA;VENTOLIN HFA) 108 (90 BASE) MCG/ACT inhaler Inhale 2 puffs into the lungs every 6 (six) hours as needed for wheezing or shortness of breath.    Historical Provider, MD  albuterol (PROVENTIL HFA;VENTOLIN HFA) 108 (90 BASE) MCG/ACT inhaler Inhale 1-2 puffs into the lungs every 6 (six) hours as needed for wheezing or shortness of breath. 04/17/14    Heather Laisure, PA-C  cyclobenzaprine (FLEXERIL) 10 MG tablet Take 1 tablet (10 mg total) by mouth 2 (two) times daily as needed for muscle spasms. 03/25/14   Tiffany Marilu Favre, PA-C  guaiFENesin-codeine 100-10 MG/5ML syrup Take 5 mLs by mouth 3 (three) times daily as needed for cough. 04/17/14   Heather Laisure, PA-C  HYDROcodone-acetaminophen (NORCO) 10-325 MG per tablet Take 1 tablet by mouth every 4 (four) hours as needed for moderate pain.    Historical Provider, MD  lisinopril (PRINIVIL,ZESTRIL) 10 MG tablet Take 10 mg by mouth every morning.     Historical Provider, MD  naproxen (NAPROSYN) 375 MG tablet Take 1 tablet (375 mg total) by mouth 2 (two) times daily. 03/25/14   Tiffany Marilu Favre, PA-C  oxyCODONE-acetaminophen (PERCOCET) 10-325 MG per tablet Take 1 tablet by mouth 3 (three) times daily as needed for pain.  03/06/14   Historical Provider, MD   BP 132/75 mmHg  Pulse 84  Temp(Src) 100.9 F (38.3 C) (Oral)  Resp 16  SpO2 95% Physical Exam  Constitutional: He appears well-developed and well-nourished. No distress.  HENT:  Head: Normocephalic and atraumatic.  Nasal congestion  Eyes: EOM are normal. Pupils are equal, round, and reactive to light.  Neck: Normal range of motion.  Cardiovascular: Normal rate.   No murmur heard. Pulmonary/Chest:  Mild increased WOB. Intermittent wheezingf and crackles L middle and  lower lobes > RLL.    Musculoskeletal: Normal range of motion. He exhibits no edema or tenderness.  Neurological: He is alert.  Skin: Skin is warm. He is not diaphoretic.  Psychiatric: He has a normal mood and affect. His behavior is normal. Judgment and thought content normal.    ED Course  Procedures (including critical care time) Labs Review Labs Reviewed - No data to display  Imaging Review Dg Chest 2 View  05/06/2014   CLINICAL DATA:  Productive cough for 3 weeks with wheezing. Subsequent encounter.  EXAM: CHEST  2 VIEW  COMPARISON:  04/17/2014.  FINDINGS: LEFT  lower lobe pneumonia is present. Cardiopericardial silhouette within normal limits. RIGHT lung clear. No effusion.  IMPRESSION: LEFT lower lobe pneumonia. Followup in 4-6 weeks to ensure radiographic clearing and exclude an underlying lesion is recommended.   Electronically Signed   By: Dereck Ligas M.D.   On: 05/06/2014 19:09     MDM   1. CAP (community acquired pneumonia)   2. Cough   possible COPD flare as well DUoneb and solumedrol 125mg  IM given in office w/ improvement Much improved after above treatments CTX 1gm IM given in office Start Zpack  Precautions given and all questions answered  Linna Darner, MD Family Medicine 05/06/2014, 7:16 PM    Waldemar Dickens, MD 05/06/14 [phone]

## 2014-05-06 NOTE — Discharge Instructions (Signed)
You have pneumonia This was partially treated tonight but will require further antibiotics Please take all of the antibiotics It may take your cough several weeks to completely go away.

## 2014-05-19 ENCOUNTER — Encounter (HOSPITAL_COMMUNITY): Payer: Self-pay | Admitting: *Deleted

## 2014-05-19 ENCOUNTER — Emergency Department (HOSPITAL_COMMUNITY)
Admission: EM | Admit: 2014-05-19 | Discharge: 2014-05-19 | Disposition: A | Discharge status: Home / Self Care | Payer: Medicare Other | Attending: Emergency Medicine | Admitting: Emergency Medicine

## 2014-05-19 ENCOUNTER — Emergency Department (HOSPITAL_COMMUNITY): Payer: Medicare Other

## 2014-05-19 DIAGNOSIS — G4733 Obstructive sleep apnea (adult) (pediatric): Secondary | ICD-10-CM | POA: Diagnosis not present

## 2014-05-19 DIAGNOSIS — Z79899 Other long term (current) drug therapy: Secondary | ICD-10-CM | POA: Diagnosis not present

## 2014-05-19 DIAGNOSIS — R059 Cough, unspecified: Secondary | ICD-10-CM

## 2014-05-19 DIAGNOSIS — Z8639 Personal history of other endocrine, nutritional and metabolic disease: Secondary | ICD-10-CM | POA: Insufficient documentation

## 2014-05-19 DIAGNOSIS — Z792 Long term (current) use of antibiotics: Secondary | ICD-10-CM | POA: Insufficient documentation

## 2014-05-19 DIAGNOSIS — R05 Cough: Secondary | ICD-10-CM | POA: Diagnosis present

## 2014-05-19 DIAGNOSIS — J189 Pneumonia, unspecified organism: Secondary | ICD-10-CM

## 2014-05-19 DIAGNOSIS — Z9981 Dependence on supplemental oxygen: Secondary | ICD-10-CM | POA: Insufficient documentation

## 2014-05-19 DIAGNOSIS — J159 Unspecified bacterial pneumonia: Secondary | ICD-10-CM | POA: Diagnosis not present

## 2014-05-19 DIAGNOSIS — Z720 Tobacco use: Secondary | ICD-10-CM | POA: Insufficient documentation

## 2014-05-19 DIAGNOSIS — I10 Essential (primary) hypertension: Secondary | ICD-10-CM | POA: Diagnosis not present

## 2014-05-19 LAB — CBC WITH DIFFERENTIAL/PLATELET
BASOS ABS: 0.1 10*3/uL (ref 0.0–0.1)
Basophils Relative: 1 % (ref 0–1)
EOS ABS: 0.2 10*3/uL (ref 0.0–0.7)
Eosinophils Relative: 2 % (ref 0–5)
HCT: 39.8 % (ref 39.0–52.0)
Hemoglobin: 13.1 g/dL (ref 13.0–17.0)
LYMPHS PCT: 29 % (ref 12–46)
Lymphs Abs: 2.4 10*3/uL (ref 0.7–4.0)
MCH: 32 pg (ref 26.0–34.0)
MCHC: 32.9 g/dL (ref 30.0–36.0)
MCV: 97.1 fL (ref 78.0–100.0)
Monocytes Absolute: 0.4 10*3/uL (ref 0.1–1.0)
Monocytes Relative: 5 % (ref 3–12)
Neutro Abs: 5.3 10*3/uL (ref 1.7–7.7)
Neutrophils Relative %: 63 % (ref 43–77)
PLATELETS: 283 10*3/uL (ref 150–400)
RBC: 4.1 MIL/uL — AB (ref 4.22–5.81)
RDW: 13.3 % (ref 11.5–15.5)
WBC: 8.4 10*3/uL (ref 4.0–10.5)

## 2014-05-19 LAB — BASIC METABOLIC PANEL
ANION GAP: 14 (ref 5–15)
BUN: 12 mg/dL (ref 6–23)
CALCIUM: 9.7 mg/dL (ref 8.4–10.5)
CO2: 24 meq/L (ref 19–32)
Chloride: 104 mEq/L (ref 96–112)
Creatinine, Ser: 0.91 mg/dL (ref 0.50–1.35)
GFR calc Af Amer: >90 mL/min (ref >90)
Glucose, Bld: 87 mg/dL (ref 70–99)
Potassium: 3.8 mEq/L (ref 3.7–5.3)
SODIUM: 142 meq/L (ref 137–147)

## 2014-05-19 MED ORDER — ALBUTEROL SULFATE HFA 108 (90 BASE) MCG/ACT IN AERS
2.0000 | INHALATION_SPRAY | Freq: Once | RESPIRATORY_TRACT | Status: AC
  Administered 2014-05-19: 2 via RESPIRATORY_TRACT
  Filled 2014-05-19: qty 6.7

## 2014-05-19 MED ORDER — LEVOFLOXACIN 750 MG PO TABS: 750.0000 mg | ORAL_TABLET | Freq: Every day | ORAL | Status: DC

## 2014-05-19 MED ORDER — HYDROCODONE-HOMATROPINE 5-1.5 MG/5ML PO SYRP: 5.0000 mL | ORAL_SOLUTION | Freq: Four times a day (QID) | ORAL | Status: DC | PRN

## 2014-05-19 NOTE — Progress Notes (Addendum)
ED CM noted patient to be without PCP listed in record. Patient has had 6 ED visits in the past 6 months. Met with patient at bedside confirmed information.  Patient states Dr. Jeanie Cooks is his PCP and he is able to get f/u appointments when necessary. Discussed the importance and benefits of primary care, and reminded patient to always schedule f/u appt after acute care episodes, patient verbalized understanding and appreciation. No further CM needs identified.

## 2014-05-19 NOTE — ED Provider Notes (Signed)
CSN: [id number]     Arrival date & time 05/19/14  1814 History   First MD Initiated Contact with Patient 05/19/14 2020     Chief Complaint  Patient presents with  . Cough     (Consider location/radiation/quality/duration/timing/severity/associated sxs/prior Treatment) HPI Comments: Patient is a 58 year old male with a past medical history of hypertension, hyperlipidemia, and OSA who presents with cough for the past 3 weeks. Symptoms started gradually and progressively worsened since the onset. The cough is productive with green sputum. Patient reports being seen at Urgent Care 2 weeks ago where he was treated for CAP with azithromycin and 1g IM rocephin. Patient reports no relief after the treatment. No other associated symptoms. No aggravating/alleviating factors.    Past Medical History  Diagnosis Date  . Hypertension   . Hyperlipidemia   . OSA (obstructive sleep apnea) 12/09/2011    PSG 01/03/12>>AHI 45.7, SpO2 low 73%, PLMI 0.  CPAP 15 cm H2O>>AHI 0, +R.   Marland Kitchen Bronchitis    Past Surgical History  Procedure Laterality Date  . Joint replacement    . Back surgery    . Total hip arthroplasty     Family History  Problem Relation Age of Onset  . Heart attack Father   . Cancer Mother    History  Substance Use Topics  . Smoking status: Current Some Day Smoker -- 0.50 packs/day for 39 years    Types: Cigarettes  . Smokeless tobacco: Not on file     Comment: states on and off smoker  . Alcohol Use: No    Review of Systems  Constitutional: Negative for fever, chills and fatigue.  HENT: Positive for congestion. Negative for trouble swallowing.   Eyes: Negative for visual disturbance.  Respiratory: Positive for cough. Negative for shortness of breath.   Cardiovascular: Negative for chest pain and palpitations.  Gastrointestinal: Negative for nausea, vomiting, abdominal pain and diarrhea.  Genitourinary: Negative for dysuria and difficulty urinating.  Musculoskeletal: Negative for  arthralgias and neck pain.  Skin: Negative for color change.  Neurological: Negative for dizziness and weakness.  Psychiatric/Behavioral: Negative for dysphoric mood.      Allergies  Review of patient's allergies indicates no known allergies.  Home Medications   Prior to Admission medications   Medication Sig Start Date End Date Taking? Authorizing Provider  lisinopril (PRINIVIL,ZESTRIL) 10 MG tablet Take 20 mg by mouth every morning.    Yes Historical Provider, MD  oxyCODONE-acetaminophen (PERCOCET) 10-325 MG per tablet Take 1 tablet by mouth 3 (three) times daily as needed for pain.  03/06/14  Yes Historical Provider, MD  albuterol (PROVENTIL HFA;VENTOLIN HFA) 108 (90 BASE) MCG/ACT inhaler Inhale 1-2 puffs into the lungs every 6 (six) hours as needed for wheezing or shortness of breath. Patient not taking: Reported on 05/19/2014 04/17/14   Hyman Bible, PA-C  azithromycin (ZITHROMAX) 250 MG tablet Take 1 tablet (250 mg total) by mouth daily. Take first 2 tablets together, then 1 every day until finished. 05/06/14   Waldemar Dickens, MD  cyclobenzaprine (FLEXERIL) 10 MG tablet Take 1 tablet (10 mg total) by mouth 2 (two) times daily as needed for muscle spasms. Patient not taking: Reported on 05/19/2014 03/25/14   Linus Mako, PA-C  guaiFENesin-codeine 100-10 MG/5ML syrup Take 5 mLs by mouth 3 (three) times daily as needed for cough. 04/17/14   Heather Laisure, PA-C  naproxen (NAPROSYN) 375 MG tablet Take 1 tablet (375 mg total) by mouth 2 (two) times daily. Patient not taking:  Reported on 05/19/2014 03/25/14   Linus Mako, PA-C   BP 122/82 mmHg  Pulse 58  Temp(Src) 98 F (36.7 C) (Oral)  Resp 18  Ht 5\' 8"  (1.727 m)  Wt 243 lb (110.224 kg)  BMI 36.96 kg/m2  SpO2 97% Physical Exam  Constitutional: He is oriented to person, place, and time. He appears well-developed and well-nourished. No distress.  Patient coughing.   HENT:  Head: Normocephalic and atraumatic.  Eyes:  Conjunctivae are normal.  Neck: Normal range of motion.  Cardiovascular: Normal rate and regular rhythm.  Exam reveals no gallop and no friction rub.   No murmur heard. Pulmonary/Chest: Effort normal and breath sounds normal. He has no wheezes. He has no rales. He exhibits no tenderness.  Abdominal: Soft. He exhibits no distension. There is no tenderness. There is no rebound.  Musculoskeletal: Normal range of motion.  Neurological: He is alert and oriented to person, place, and time. Coordination normal.  Speech is goal-oriented. Moves limbs without ataxia.   Skin: Skin is warm and dry.  Psychiatric: He has a normal mood and affect. His behavior is normal.  Nursing note and vitals reviewed.   ED Course  Procedures (including critical care time) Labs Review Labs Reviewed  CBC WITH DIFFERENTIAL - Abnormal; Notable for the following:    RBC 4.10 (*)    All other components within normal limits  BASIC METABOLIC PANEL    Imaging Review Dg Chest 2 View  05/19/2014   CLINICAL DATA:  58 year old male with Cough, congestion and shortness of breath, with chest pain for the past week. Recently treated for pneumonia.  EXAM: CHEST  2 VIEW  COMPARISON:  Chest x-ray 05/06/2014.  FINDINGS: Compared to the prior study there has been only partial resolution of the area of airspace consolidation in the medial aspect of the left lower lobe. Lungs are otherwise clear. No pleural effusions. No evidence of pulmonary edema. Heart size and mediastinal contours are within normal limits.  IMPRESSION: 1. Incomplete resolution of airspace consolidation in the medial aspect of the left lower lobe. Persistent pneumonia in the setting of a full course of antibiotic therapy is concerning for either ineffective antimicrobial coverage, or incomplete clearance secondary to a centrally obstructing lesion. At the very least, a repeat chest x-ray in 2-3 weeks should be obtained after additional antimicrobial therapy. If for  some reason there is clinical concern for a centrally obstructing lesion, a contrast enhanced chest CT could be performed.   Electronically Signed   By: Vinnie Langton M.D.   On: 05/19/2014 18:54     EKG Interpretation None      MDM   Final diagnoses:  CAP (community acquired pneumonia)   10:02 PM  Chest xray shows unresolved pneumonia in the left lower lobe. Vitals stable and patient afebrile at this time, although he was febrile on arrival. Patient without difficulty breathing. Patient will have levaquin, albuterol inhaler and hycodan. Patient instructed to return with difficulty breathing or worsening or concerning symptoms.    Alvina Chou, PA-C 05/19/14 2212  Pamella Pert, MD 05/20/14 0000

## 2014-05-19 NOTE — ED Notes (Signed)
Pt monitored by pulse ox, bp cuff, and 5-lead. 

## 2014-05-19 NOTE — ED Notes (Signed)
Pt in c/o cough and congestion for the last week, states he was recently treated for pneumonia and this feels the same, states he doesn't think it went away, no distress noted

## 2014-05-19 NOTE — Discharge Instructions (Signed)
Take Levaquin as directed until gone. Take Hycodan as needed for cough. Use inhaler as needed. Refer to attached documents for more information. Return to the ED with worsening or concerning symptoms.

## 2014-05-19 NOTE — ED Notes (Signed)
Caitlyn, PA-C, at the bedside to see the patient.

## 2014-07-24 ENCOUNTER — Encounter (HOSPITAL_COMMUNITY): Payer: Self-pay | Admitting: Emergency Medicine

## 2014-07-24 ENCOUNTER — Emergency Department (HOSPITAL_COMMUNITY)
Admission: EM | Admit: 2014-07-24 | Discharge: 2014-07-24 | Disposition: A | Discharge status: Home / Self Care | Payer: Medicare Other | Attending: Emergency Medicine | Admitting: Emergency Medicine

## 2014-07-24 DIAGNOSIS — I10 Essential (primary) hypertension: Secondary | ICD-10-CM | POA: Insufficient documentation

## 2014-07-24 DIAGNOSIS — Z8669 Personal history of other diseases of the nervous system and sense organs: Secondary | ICD-10-CM | POA: Insufficient documentation

## 2014-07-24 DIAGNOSIS — Z79899 Other long term (current) drug therapy: Secondary | ICD-10-CM | POA: Insufficient documentation

## 2014-07-24 DIAGNOSIS — Z791 Long term (current) use of non-steroidal anti-inflammatories (NSAID): Secondary | ICD-10-CM | POA: Insufficient documentation

## 2014-07-24 DIAGNOSIS — Z792 Long term (current) use of antibiotics: Secondary | ICD-10-CM | POA: Diagnosis not present

## 2014-07-24 DIAGNOSIS — Z720 Tobacco use: Secondary | ICD-10-CM | POA: Insufficient documentation

## 2014-07-24 DIAGNOSIS — L309 Dermatitis, unspecified: Secondary | ICD-10-CM

## 2014-07-24 DIAGNOSIS — Z8709 Personal history of other diseases of the respiratory system: Secondary | ICD-10-CM | POA: Diagnosis not present

## 2014-07-24 DIAGNOSIS — Z7952 Long term (current) use of systemic steroids: Secondary | ICD-10-CM | POA: Diagnosis not present

## 2014-07-24 DIAGNOSIS — L209 Atopic dermatitis, unspecified: Secondary | ICD-10-CM

## 2014-07-24 DIAGNOSIS — R21 Rash and other nonspecific skin eruption: Secondary | ICD-10-CM | POA: Diagnosis present

## 2014-07-24 MED ORDER — HYDROCORTISONE 1 % EX CREA: TOPICAL_CREAM | CUTANEOUS | Status: DC

## 2014-07-24 MED ORDER — CARRINGTON MOISTURE BARRIER EX CREA: TOPICAL_CREAM | CUTANEOUS | Status: DC | PRN

## 2014-07-24 MED ORDER — LORATADINE 10 MG PO TABS: 10.0000 mg | ORAL_TABLET | Freq: Every day | ORAL | Status: DC

## 2014-07-24 NOTE — ED Provider Notes (Signed)
CSN: [id number]     Arrival date & time 07/24/14  1525 History   First MD Initiated Contact with Patient 07/24/14 1606     Chief Complaint  Patient presents with  . Rash     (Consider location/radiation/quality/duration/timing/severity/associated sxs/prior Treatment) HPI Peter Manning is a 88 yom with pmhx of HTN, HLD, Eczema who presents to the ER with one day of flare-up of his eczema.  Pt reports pruritis and rash in the flexor surfaces of elbows and knees bilaterally.  He denies any new use of products, new clothes, new food, or chemicals.  He denies SOB, chest pain, oral/throat swelling, N/V/D.    Past Medical History  Diagnosis Date  . Hypertension   . Hyperlipidemia   . OSA (obstructive sleep apnea) 12/09/2011    PSG 01/03/12>>AHI 45.7, SpO2 low 73%, PLMI 0.  CPAP 15 cm H2O>>AHI 0, +R.   Marland Kitchen Bronchitis    Past Surgical History  Procedure Laterality Date  . Joint replacement    . Back surgery    . Total hip arthroplasty     Family History  Problem Relation Age of Onset  . Heart attack Father   . Cancer Mother    History  Substance Use Topics  . Smoking status: Current Some Day Smoker -- 0.50 packs/day for 39 years    Types: Cigarettes  . Smokeless tobacco: Not on file     Comment: states on and off smoker  . Alcohol Use: No    Review of Systems  Constitutional: Negative for fever and chills.  Skin: Positive for rash.      Allergies  Review of patient's allergies indicates no known allergies.  Home Medications   Prior to Admission medications   Medication Sig Start Date End Date Taking? Authorizing Provider  albuterol (PROVENTIL HFA;VENTOLIN HFA) 108 (90 BASE) MCG/ACT inhaler Inhale 1-2 puffs into the lungs every 6 (six) hours as needed for wheezing or shortness of breath. Patient not taking: Reported on 05/19/2014 04/17/14   Hyman Bible, PA-C  azithromycin (ZITHROMAX) 250 MG tablet Take 1 tablet (250 mg total) by mouth daily. Take first 2 tablets  together, then 1 every day until finished. 05/06/14   Waldemar Dickens, MD  cyclobenzaprine (FLEXERIL) 10 MG tablet Take 1 tablet (10 mg total) by mouth 2 (two) times daily as needed for muscle spasms. Patient not taking: Reported on 05/19/2014 03/25/14   Linus Mako, PA-C  guaiFENesin-codeine 100-10 MG/5ML syrup Take 5 mLs by mouth 3 (three) times daily as needed for cough. 04/17/14   Heather Laisure, PA-C  HYDROcodone-homatropine (HYCODAN) 5-1.5 MG/5ML syrup Take 5 mLs by mouth every 6 (six) hours as needed. 05/19/14   Alvina Chou, PA-C  hydrocortisone cream 1 % Apply to affected area 2 times daily 07/24/14   Carrie Mew, PA-C  levofloxacin (LEVAQUIN) 750 MG tablet Take 1 tablet (750 mg total) by mouth daily. 05/19/14   Kaitlyn Szekalski, PA-C  lisinopril (PRINIVIL,ZESTRIL) 10 MG tablet Take 20 mg by mouth every morning.     Historical Provider, MD  loratadine (CLARITIN) 10 MG tablet Take 1 tablet (10 mg total) by mouth daily. One po daily x 5 days 07/24/14   Carrie Mew, PA-C  naproxen (NAPROSYN) 375 MG tablet Take 1 tablet (375 mg total) by mouth 2 (two) times daily. Patient not taking: Reported on 05/19/2014 03/25/14   Linus Mako, PA-C  oxyCODONE-acetaminophen (PERCOCET) 10-325 MG per tablet Take 1 tablet by mouth 3 (three) times daily as needed  for pain.  03/06/14   Historical Provider, MD  Skin Protectants, Misc. (EUCERIN) cream Apply topically as needed for wound care. 07/24/14   Carrie Mew, PA-C   BP 118/79 mmHg  Pulse 64  Temp(Src) 97.3 F (36.3 C) (Oral)  Resp 16  Ht 5\' 8"  (1.727 m)  Wt 243 lb (110.224 kg)  BMI 36.96 kg/m2  SpO2 94% Physical Exam  Constitutional: He is oriented to person, place, and time. He appears well-developed and well-nourished. No distress.  HENT:  Head: Normocephalic and atraumatic.  Eyes: Right eye exhibits no discharge. Left eye exhibits no discharge. No scleral icterus.  Neck: Normal range of motion.  Pulmonary/Chest: Effort normal.  No respiratory distress.  Musculoskeletal: Normal range of motion.  Neurological: He is alert and oriented to person, place, and time.  Skin: Skin is warm and dry. He is not diaphoretic.  Lichenified, macular, scaly, excoriated rash noted to bilateral flexor surfaces of elbows, knees.    Psychiatric: He has a normal mood and affect.  Nursing note and vitals reviewed.   ED Course  Procedures (including critical care time) Labs Review Labs Reviewed - No data to display  Imaging Review No results found.   EKG Interpretation None      MDM   Final diagnoses:  Atopic dermatitis  Eczema    No evidence of SJS or necrotizing fasciitis. Due to pruritic and not painful nature of rash do not suspect pemphigus vulgaris. Pustules do not resemble scabies as per pt hx or allergic reaction. No blisters, no pustules, no warmth, no draining sinus tracts, no superficial abscesses, no bullous impetigo, no vesicles, no desquamation, no target lesions with dusky purpura or a central bulla. Not tender to touch. Pt given Rx for hydrocortisone cream, eucerin cream, claritin OTC for pruritis.  Pt strongly encouraged to f/u with PCP.  Pt reports he has an appt next week on 1/25, and was encouraged to f/u then.  Return precautions discussed with pt and pt verbalized understanding and agreement of plan.  I encouraged pt to call or return to the ER should he have any questions or concerns.    BP 118/79 mmHg  Pulse 64  Temp(Src) 97.3 F (36.3 C) (Oral)  Resp 16  Ht 5\' 8"  (1.727 m)  Wt 243 lb (110.224 kg)  BMI 36.96 kg/m2  SpO2 94%  Signed,  Dahlia Bailiff, PA-C 4:29 PM      Carrie Mew, PA-C 07/24/14 1629  Leota Jacobsen, MD 07/27/14 1500

## 2014-07-24 NOTE — ED Notes (Signed)
Pt with rash to bilateral anterior arms, and legs and in between fingers on R hand. Areas itch and are slightly painful d/t skin breakdown. Pt with hx eczema.

## 2014-07-24 NOTE — Discharge Instructions (Signed)
Follow up with your primary care doctor.  Return to the ER if symptoms worsen.  Use Hydrocortisone cream twice daily to affected areas.  Apply Eucerin cream liberally to affected areas multiple times per day.  Use claritin once a day for itching.     Eczema Eczema, also called atopic dermatitis, is a skin disorder that causes inflammation of the skin. It causes a red rash and dry, scaly skin. The skin becomes very itchy. Eczema is generally worse during the cooler winter months and often improves with the warmth of summer. Eczema usually starts showing signs in infancy. Some children outgrow eczema, but it may last through adulthood.  CAUSES  The exact cause of eczema is not known, but it appears to run in families. People with eczema often have a family history of eczema, allergies, asthma, or hay fever. Eczema is not contagious. Flare-ups of the condition may be caused by:   Contact with something you are sensitive or allergic to.   Stress. SIGNS AND SYMPTOMS  Dry, scaly skin.   Red, itchy rash.   Itchiness. This may occur before the skin rash and may be very intense.  DIAGNOSIS  The diagnosis of eczema is usually made based on symptoms and medical history. TREATMENT  Eczema cannot be cured, but symptoms usually can be controlled with treatment and other strategies. A treatment plan might include:  Controlling the itching and scratching.   Use over-the-counter antihistamines as directed for itching. This is especially useful at night when the itching tends to be worse.   Use over-the-counter steroid creams as directed for itching.   Avoid scratching. Scratching makes the rash and itching worse. It may also result in a skin infection (impetigo) due to a break in the skin caused by scratching.   Keeping the skin well moisturized with creams every day. This will seal in moisture and help prevent dryness. Lotions that contain alcohol and water should be avoided because they can  dry the skin.   Limiting exposure to things that you are sensitive or allergic to (allergens).   Recognizing situations that cause stress.   Developing a plan to manage stress.  HOME CARE INSTRUCTIONS   Only take over-the-counter or prescription medicines as directed by your health care provider.   Do not use anything on the skin without checking with your health care provider.   Keep baths or showers short (5 minutes) in warm (not hot) water. Use mild cleansers for bathing. These should be unscented. You may add nonperfumed bath oil to the bath water. It is best to avoid soap and bubble bath.   Immediately after a bath or shower, when the skin is still damp, apply a moisturizing ointment to the entire body. This ointment should be a petroleum ointment. This will seal in moisture and help prevent dryness. The thicker the ointment, the better. These should be unscented.   Keep fingernails cut short. Children with eczema may need to wear soft gloves or mittens at night after applying an ointment.   Dress in clothes made of cotton or cotton blends. Dress lightly, because heat increases itching.   A child with eczema should stay away from anyone with fever blisters or cold sores. The virus that causes fever blisters (herpes simplex) can cause a serious skin infection in children with eczema. SEEK MEDICAL CARE IF:   Your itching interferes with sleep.   Your rash gets worse or is not better within 1 week after starting treatment.   You see  pus or soft yellow scabs in the rash area.   You have a fever.   You have a rash flare-up after contact with someone who has fever blisters.  Document Released: 06/17/2000 Document Revised: 04/10/2013 Document Reviewed: 01/21/2013 [facility] Patient Information 2015 [employer], [place]. This information is not intended to replace advice given to you by your health care provider. Make sure you discuss any questions you have with your health  care provider.

## 2014-08-12 ENCOUNTER — Emergency Department (HOSPITAL_COMMUNITY)
Admission: EM | Admit: 2014-08-12 | Discharge: 2014-08-12 | Disposition: A | Discharge status: Home / Self Care | Payer: Medicare Other | Attending: Emergency Medicine | Admitting: Emergency Medicine

## 2014-08-12 ENCOUNTER — Encounter (HOSPITAL_COMMUNITY): Payer: Self-pay | Admitting: Emergency Medicine

## 2014-08-12 DIAGNOSIS — B372 Candidiasis of skin and nail: Secondary | ICD-10-CM | POA: Diagnosis not present

## 2014-08-12 DIAGNOSIS — Z792 Long term (current) use of antibiotics: Secondary | ICD-10-CM | POA: Insufficient documentation

## 2014-08-12 DIAGNOSIS — Z8639 Personal history of other endocrine, nutritional and metabolic disease: Secondary | ICD-10-CM | POA: Diagnosis not present

## 2014-08-12 DIAGNOSIS — Z7952 Long term (current) use of systemic steroids: Secondary | ICD-10-CM | POA: Diagnosis not present

## 2014-08-12 DIAGNOSIS — L989 Disorder of the skin and subcutaneous tissue, unspecified: Secondary | ICD-10-CM | POA: Diagnosis present

## 2014-08-12 DIAGNOSIS — Z720 Tobacco use: Secondary | ICD-10-CM | POA: Diagnosis not present

## 2014-08-12 DIAGNOSIS — I10 Essential (primary) hypertension: Secondary | ICD-10-CM | POA: Insufficient documentation

## 2014-08-12 DIAGNOSIS — Z79899 Other long term (current) drug therapy: Secondary | ICD-10-CM | POA: Insufficient documentation

## 2014-08-12 DIAGNOSIS — Z8709 Personal history of other diseases of the respiratory system: Secondary | ICD-10-CM | POA: Insufficient documentation

## 2014-08-12 DIAGNOSIS — R21 Rash and other nonspecific skin eruption: Secondary | ICD-10-CM | POA: Diagnosis not present

## 2014-08-12 DIAGNOSIS — Z8669 Personal history of other diseases of the nervous system and sense organs: Secondary | ICD-10-CM | POA: Insufficient documentation

## 2014-08-12 MED ORDER — FLUCONAZOLE 200 MG PO TABS: 200.0000 mg | ORAL_TABLET | Freq: Every day | ORAL | Status: AC

## 2014-08-12 MED ORDER — KETOCONAZOLE 2 % EX CREA: 1.0000 "application " | TOPICAL_CREAM | Freq: Two times a day (BID) | CUTANEOUS | Status: DC

## 2014-08-12 MED ORDER — HYDROXYZINE HCL 25 MG PO TABS: 25.0000 mg | ORAL_TABLET | Freq: Four times a day (QID) | ORAL | Status: DC

## 2014-08-12 NOTE — ED Provider Notes (Signed)
CSN: [id number]     Arrival date & time 08/12/14  0120 History  This chart was scribed for Blanchie Dessert, MD by Rayfield Citizen, ED Scribe. This patient was seen in room A10C/A10C and the patient's care was started at 2:13 AM.    Chief Complaint  Patient presents with  . Skin Problem   The history is provided by the patient. No language interpreter was used.     HPI Comments: Peter Manning is a 59 y.o. male who presents to the Emergency Department complaining of itchy, weeping rash in the flexor surfaces of both elbows and both knees, as well as the webbing between the third and fourth fingers on the right hand. Patient was seen in [facility] ED 07/24/14 for similar symptoms; at that time, Dahlia Bailiff PA-C prescribed hydrocortisone cream, Eucerin cream, Claritin OTC for pruritis. He reports that his symptoms worsened when he applied these creams as prescribed; the dryness worsened with weeping beginning yesterday. He took Benadryl for the associated itching with some relief.   He states that his last experience with similar symptoms was 20 years PTA.   Past Medical History  Diagnosis Date  . Hypertension   . Hyperlipidemia   . OSA (obstructive sleep apnea) 12/09/2011    PSG 01/03/12>>AHI 45.7, SpO2 low 73%, PLMI 0.  CPAP 15 cm H2O>>AHI 0, +R.   Marland Kitchen Bronchitis    Past Surgical History  Procedure Laterality Date  . Joint replacement    . Back surgery    . Total hip arthroplasty     Family History  Problem Relation Age of Onset  . Heart attack Father   . Cancer Mother    History  Substance Use Topics  . Smoking status: Current Some Day Smoker -- 0.50 packs/day for 39 years    Types: Cigarettes  . Smokeless tobacco: Not on file     Comment: states on and off smoker  . Alcohol Use: No    Review of Systems  A complete 10 system review of systems was obtained and all systems are negative except as noted in the HPI and PMH.   Allergies  Review of patient's allergies indicates no known  allergies.  Home Medications   Prior to Admission medications   Medication Sig Start Date End Date Taking? Authorizing Provider  albuterol (PROVENTIL HFA;VENTOLIN HFA) 108 (90 BASE) MCG/ACT inhaler Inhale 1-2 puffs into the lungs every 6 (six) hours as needed for wheezing or shortness of breath. Patient not taking: Reported on 05/19/2014 04/17/14   Hyman Bible, PA-C  azithromycin (ZITHROMAX) 250 MG tablet Take 1 tablet (250 mg total) by mouth daily. Take first 2 tablets together, then 1 every day until finished. 05/06/14   Waldemar Dickens, MD  cyclobenzaprine (FLEXERIL) 10 MG tablet Take 1 tablet (10 mg total) by mouth 2 (two) times daily as needed for muscle spasms. Patient not taking: Reported on 05/19/2014 03/25/14   Linus Mako, PA-C  guaiFENesin-codeine 100-10 MG/5ML syrup Take 5 mLs by mouth 3 (three) times daily as needed for cough. 04/17/14   Heather Laisure, PA-C  HYDROcodone-homatropine (HYCODAN) 5-1.5 MG/5ML syrup Take 5 mLs by mouth every 6 (six) hours as needed. 05/19/14   Alvina Chou, PA-C  hydrocortisone cream 1 % Apply to affected area 2 times daily 07/24/14   Carrie Mew, PA-C  levofloxacin (LEVAQUIN) 750 MG tablet Take 1 tablet (750 mg total) by mouth daily. 05/19/14   Kaitlyn Szekalski, PA-C  lisinopril (PRINIVIL,ZESTRIL) 10 MG tablet Take 20 mg  by mouth every morning.     Historical Provider, MD  loratadine (CLARITIN) 10 MG tablet Take 1 tablet (10 mg total) by mouth daily. One po daily x 5 days 07/24/14   Carrie Mew, PA-C  naproxen (NAPROSYN) 375 MG tablet Take 1 tablet (375 mg total) by mouth 2 (two) times daily. Patient not taking: Reported on 05/19/2014 03/25/14   Linus Mako, PA-C  oxyCODONE-acetaminophen (PERCOCET) 10-325 MG per tablet Take 1 tablet by mouth 3 (three) times daily as needed for pain.  03/06/14   Historical Provider, MD  Skin Protectants, Misc. (EUCERIN) cream Apply topically as needed for wound care. 07/24/14   Carrie Mew, PA-C    BP 159/85 mmHg  Pulse 76  Temp(Src) 97.9 F (36.6 C) (Oral)  Resp 22  Ht 5\' 8"  (1.727 m)  Wt 245 lb (111.131 kg)  BMI 37.26 kg/m2  SpO2 97% Physical Exam  Constitutional: He is oriented to person, place, and time. He appears well-developed and well-nourished. No distress.  HENT:  Head: Normocephalic and atraumatic.  Eyes: EOM are normal. Pupils are equal, round, and reactive to light.  Cardiovascular: Normal rate and regular rhythm.   Pulmonary/Chest: Effort normal. No respiratory distress.  Abdominal: Soft.  Musculoskeletal: Normal range of motion.  Neurological: He is alert and oriented to person, place, and time.  Skin: Skin is warm and dry. Rash (Weeping excoriated papular erythematous rash in the bilateral flexor surfaces of the antecubital fossas/popliteal area of the legs bilaterally with satellite lesions . Macerated weeping area on the right third and fourth webspace. ) noted.  Psychiatric: He has a normal mood and affect. His behavior is normal.  Nursing note and vitals reviewed.   ED Course  Procedures   DIAGNOSTIC STUDIES: Oxygen Saturation is 96% on RA, adequate by my interpretation.    COORDINATION OF CARE: 2:20 AM Discussed treatment plan with pt at bedside and pt agreed to plan.   Labs Review Labs Reviewed - No data to display  Imaging Review No results found.   EKG Interpretation None      MDM   Final diagnoses:  Rash  Cutaneous candidiasis   Pt with a rash that is concerning for possible fungal infection. She initially tried using steroid cream and it became much worse. Is now weeping and oozing. Will treat for a fungal infection with Diflucan and ketoconazole. Also given dermatology f/u  I personally performed the services described in this documentation, which was scribed in my presence.  The recorded information has been reviewed and considered.      Blanchie Dessert, MD 08/12/14 [phone]

## 2014-08-12 NOTE — ED Notes (Signed)
Pt. reports worsening itchy skin with dryness and drainage at arms and legs this week unrelieved by OTC cream and Claritin .

## 2014-08-12 NOTE — Discharge Instructions (Signed)
Cutaneous Candidiasis Cutaneous candidiasis is a condition in which there is an overgrowth of yeast (candida) on the skin. Yeast normally live on the skin, but in small enough numbers not to cause any symptoms. In certain cases, increased growth of the yeast may cause an actual yeast infection. This kind of infection usually occurs in areas of the skin that are constantly warm and moist, such as the armpits or the groin. Yeast is the most common cause of diaper rash in babies and in people who cannot control their bowel movements (incontinence). CAUSES  The fungus that most often causes cutaneous candidiasis is Candida albicans. Conditions that can increase the risk of getting a yeast infection of the skin include:  Obesity.  Pregnancy.  Diabetes.  Taking antibiotic medicine.  Taking birth control pills.  Taking steroid medicines.  Thyroid disease.  An iron or zinc deficiency.  Problems with the immune system. SYMPTOMS   Red, swollen area of the skin.  Bumps on the skin.  Itchiness. DIAGNOSIS  The diagnosis of cutaneous candidiasis is usually based on its appearance. Light scrapings of the skin may also be taken and viewed under a microscope to identify the presence of yeast. TREATMENT  Antifungal creams may be applied to the infected skin. In severe cases, oral medicines may be needed.  HOME CARE INSTRUCTIONS   Keep your skin clean and dry.  Maintain a healthy weight.  If you have diabetes, keep your blood sugar under control. SEEK IMMEDIATE MEDICAL CARE IF:  Your rash continues to spread despite treatment.  You have a fever, chills, or abdominal pain. Document Released: 03/08/2011 Document Revised: 09/12/2011 Document Reviewed: 03/08/2011 [employer] Patient Information 2015 [employer]. This information is not intended to replace advice given to you by your health care provider. Make sure you discuss any questions you have with your health care provider.  

## 2014-08-12 NOTE — ED Notes (Signed)
Pt a/o x 4 on d/c with steady gait. 

## 2014-11-04 ENCOUNTER — Other Ambulatory Visit: Payer: Self-pay | Admitting: Orthopaedic Surgery

## 2014-11-04 DIAGNOSIS — M25511 Pain in right shoulder: Secondary | ICD-10-CM

## 2014-11-05 ENCOUNTER — Other Ambulatory Visit: Payer: Medicare Other

## 2014-11-14 ENCOUNTER — Ambulatory Visit
Admission: RE | Admit: 2014-11-14 | Discharge: 2014-11-14 | Disposition: A | Discharge status: Home / Self Care | Payer: Medicare Other | Source: Ambulatory Visit | Attending: Orthopaedic Surgery | Admitting: Orthopaedic Surgery

## 2014-11-14 DIAGNOSIS — M25511 Pain in right shoulder: Secondary | ICD-10-CM

## 2015-06-05 ENCOUNTER — Encounter (HOSPITAL_COMMUNITY): Payer: Self-pay | Admitting: Emergency Medicine

## 2015-06-05 ENCOUNTER — Emergency Department (HOSPITAL_COMMUNITY)
Admission: EM | Admit: 2015-06-05 | Discharge: 2015-06-05 | Disposition: A | Discharge status: Home / Self Care | Payer: Medicare Other | Attending: Emergency Medicine | Admitting: Emergency Medicine

## 2015-06-05 DIAGNOSIS — Z9981 Dependence on supplemental oxygen: Secondary | ICD-10-CM | POA: Diagnosis not present

## 2015-06-05 DIAGNOSIS — F17210 Nicotine dependence, cigarettes, uncomplicated: Secondary | ICD-10-CM | POA: Insufficient documentation

## 2015-06-05 DIAGNOSIS — G4733 Obstructive sleep apnea (adult) (pediatric): Secondary | ICD-10-CM | POA: Insufficient documentation

## 2015-06-05 DIAGNOSIS — Z8639 Personal history of other endocrine, nutritional and metabolic disease: Secondary | ICD-10-CM | POA: Insufficient documentation

## 2015-06-05 DIAGNOSIS — F0781 Postconcussional syndrome: Secondary | ICD-10-CM | POA: Insufficient documentation

## 2015-06-05 DIAGNOSIS — I10 Essential (primary) hypertension: Secondary | ICD-10-CM | POA: Insufficient documentation

## 2015-06-05 DIAGNOSIS — M542 Cervicalgia: Secondary | ICD-10-CM | POA: Diagnosis not present

## 2015-06-05 DIAGNOSIS — Z79899 Other long term (current) drug therapy: Secondary | ICD-10-CM | POA: Insufficient documentation

## 2015-06-05 DIAGNOSIS — Z8709 Personal history of other diseases of the respiratory system: Secondary | ICD-10-CM | POA: Insufficient documentation

## 2015-06-05 DIAGNOSIS — R42 Dizziness and giddiness: Secondary | ICD-10-CM | POA: Diagnosis present

## 2015-06-05 DIAGNOSIS — G44309 Post-traumatic headache, unspecified, not intractable: Secondary | ICD-10-CM | POA: Insufficient documentation

## 2015-06-05 DIAGNOSIS — Z7952 Long term (current) use of systemic steroids: Secondary | ICD-10-CM | POA: Insufficient documentation

## 2015-06-05 DIAGNOSIS — Z792 Long term (current) use of antibiotics: Secondary | ICD-10-CM | POA: Insufficient documentation

## 2015-06-05 MED ORDER — NAPROXEN 500 MG PO TABS: 500.0000 mg | ORAL_TABLET | Freq: Two times a day (BID) | ORAL | Status: DC

## 2015-06-05 MED ORDER — DIAZEPAM 2 MG PO TABS: 2.0000 mg | ORAL_TABLET | Freq: Three times a day (TID) | ORAL | Status: DC | PRN

## 2015-06-05 NOTE — Discharge Instructions (Signed)
You are having a headache. No specific cause was found today for your headache. It may have been a migraine or other cause of headache. Stress, anxiety, fatigue, and depression are common triggers for headaches. Your headache today does not appear to be life-threatening or require hospitalization, but often the exact cause of headaches is not determined in the emergency department. Therefore, follow-up with your doctor is very important to find out what may have caused your headache, and whether or not you need any further diagnostic testing or treatment. Sometimes headaches can appear benign (not harmful), but then more serious symptoms can develop which should prompt an immediate re-evaluation by your doctor or the emergency department. SEEK MEDICAL ATTENTION IF: You develop possible problems with medications prescribed.  The medications don't resolve your headache, if it recurs , or if you have multiple episodes of vomiting or can't take fluids. You have a change from the usual headache. RETURN IMMEDIATELY IF you develop a sudden, severe headache or confusion, become poorly responsive or faint, develop a fever above 100.2F or problem breathing, have a change in speech, vision, swallowing, or understanding, or develop new weakness, numbness, tingling, incoordination, or have a seizure.   Post-Concussion Syndrome Post-concussion syndrome describes the symptoms that can occur after a head injury. These symptoms can last from weeks to months. CAUSES  It is not clear why some head injuries cause post-concussion syndrome. It can occur whether your head injury was mild or severe and whether you were wearing head protection or not.  SIGNS AND SYMPTOMS  Memory difficulties.  Dizziness.  Headaches.  Double vision or blurry vision.  Sensitivity to light.  Hearing difficulties.  Depression.  Tiredness.  Weakness.  Difficulty with concentration.  Difficulty sleeping or staying  asleep.  Vomiting.  Poor balance or instability on your feet.  Slow reaction time.  Difficulty learning and remembering things you have heard. DIAGNOSIS  There is no test to determine whether you have post-concussion syndrome. Your health care provider may order an imaging scan of your brain, such as a CT scan, to check for other problems that may be causing your symptoms (such as a severe injury inside your skull). TREATMENT  Usually, these problems disappear over time without medical care. Your health care provider may prescribe medicine to help ease your symptoms. It is important to follow up with a neurologist to evaluate your recovery and address any lingering symptoms or issues. HOME CARE INSTRUCTIONS   Take medicines only as directed by your health care provider. Do not take aspirin. Aspirin can slow blood clotting.  Sleep with your head slightly elevated to help with headaches.  Avoid any situation where there is potential for another head injury. This includes football, hockey, soccer, basketball, martial arts, downhill snow sports, and horseback riding. Your condition will get worse every time you experience a concussion. You should avoid these activities until you are evaluated by the appropriate follow-up health care providers.  Keep all follow-up visits as directed by your health care provider. This is important. SEEK MEDICAL CARE IF:  You have increased problems paying attention or concentrating.  You have increased difficulty remembering or learning new information.  You need more time to complete tasks or assignments than before.  You have increased irritability or decreased ability to cope with stress.  You have more symptoms than before. Seek medical care if you have any of the following symptoms for more than two weeks after your injury:  Lasting (chronic) headaches.  Dizziness or balance  problems.  Nausea.  Vision problems.  Increased sensitivity to  noise or light.  Depression or mood swings.  Anxiety or irritability.  Memory problems.  Difficulty concentrating or paying attention.  Sleep problems.  Feeling tired all the time. SEEK IMMEDIATE MEDICAL CARE IF:  You have confusion or unusual drowsiness.  Others find it difficult to wake you up.  You have nausea or persistent, forceful vomiting.  You feel like you are moving when you are not (vertigo). Your eyes may move rapidly back and forth.  You have convulsions or faint.  You have severe, persistent headaches that are not relieved by medicine.  You cannot use your arms or legs normally.  One of your pupils is larger than the other.  You have clear or bloody discharge from your nose or ears.  Your problems are getting worse, not better. MAKE SURE YOU:  Understand these instructions.  Will watch your condition.  Will get help right away if you are not doing well or get worse.   This information is not intended to replace advice given to you by your health care provider. Make sure you discuss any questions you have with your health care provider.   Document Released: 12/10/2001 Document Revised: 07/11/2014 Document Reviewed: 09/25/2013 Elsevier Interactive Patient Education [employer].
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
## 2015-06-12 ENCOUNTER — Emergency Department (HOSPITAL_COMMUNITY)
Admission: EM | Admit: 2015-06-12 | Discharge: 2015-06-12 | Disposition: A | Discharge status: Home / Self Care | Payer: Medicare Other | Attending: Emergency Medicine | Admitting: Emergency Medicine

## 2015-06-12 ENCOUNTER — Encounter (HOSPITAL_COMMUNITY): Payer: Self-pay | Admitting: Emergency Medicine

## 2015-06-12 DIAGNOSIS — G44319 Acute post-traumatic headache, not intractable: Secondary | ICD-10-CM | POA: Insufficient documentation

## 2015-06-12 DIAGNOSIS — Z8639 Personal history of other endocrine, nutritional and metabolic disease: Secondary | ICD-10-CM | POA: Insufficient documentation

## 2015-06-12 DIAGNOSIS — Z7952 Long term (current) use of systemic steroids: Secondary | ICD-10-CM | POA: Diagnosis not present

## 2015-06-12 DIAGNOSIS — Z79899 Other long term (current) drug therapy: Secondary | ICD-10-CM | POA: Insufficient documentation

## 2015-06-12 DIAGNOSIS — Z791 Long term (current) use of non-steroidal anti-inflammatories (NSAID): Secondary | ICD-10-CM | POA: Diagnosis not present

## 2015-06-12 DIAGNOSIS — F17210 Nicotine dependence, cigarettes, uncomplicated: Secondary | ICD-10-CM | POA: Diagnosis not present

## 2015-06-12 DIAGNOSIS — R51 Headache: Secondary | ICD-10-CM | POA: Diagnosis present

## 2015-06-12 DIAGNOSIS — I10 Essential (primary) hypertension: Secondary | ICD-10-CM | POA: Insufficient documentation

## 2015-06-12 DIAGNOSIS — Z9981 Dependence on supplemental oxygen: Secondary | ICD-10-CM | POA: Diagnosis not present

## 2015-06-12 DIAGNOSIS — G4733 Obstructive sleep apnea (adult) (pediatric): Secondary | ICD-10-CM | POA: Insufficient documentation

## 2015-06-12 DIAGNOSIS — Z8709 Personal history of other diseases of the respiratory system: Secondary | ICD-10-CM | POA: Diagnosis not present

## 2015-06-12 DIAGNOSIS — G44309 Post-traumatic headache, unspecified, not intractable: Secondary | ICD-10-CM

## 2015-06-12 DIAGNOSIS — Z792 Long term (current) use of antibiotics: Secondary | ICD-10-CM | POA: Diagnosis not present

## 2015-06-12 MED ORDER — KETOROLAC TROMETHAMINE 60 MG/2ML IM SOLN
60.0000 mg | Freq: Once | INTRAMUSCULAR | Status: AC
  Administered 2015-06-12: 60 mg via INTRAMUSCULAR
  Filled 2015-06-12: qty 2

## 2015-06-12 NOTE — ED Provider Notes (Signed)
CSN: [id number]     Arrival date & time 06/12/15  1221 History   First MD Initiated Contact with Patient 06/12/15 1606     Chief Complaint  Patient presents with  . Headache     (Consider location/radiation/quality/duration/timing/severity/associated sxs/prior Treatment) Patient is a 59 y.o. male presenting with headaches. The history is provided by the patient. No language interpreter was used.  Headache Pain location:  Generalized Radiates to:  Does not radiate Severity currently:  Unable to specify Severity at highest:  Unable to specify Onset quality:  Gradual Duration:  18 days Timing:  Constant Progression:  Worsening Similar to prior headaches: yes   Context comment:  Head injury Relieved by:  Nothing Worsened by:  Nothing Ineffective treatments:  None tried Associated symptoms: no facial pain, no near-syncope, no neck pain and no visual change   Risk factors: no family hx of [place]     Past Medical History  Diagnosis Date  . Hypertension   . Hyperlipidemia   . OSA (obstructive sleep apnea) 12/09/2011    PSG 01/03/12>>AHI 45.7, SpO2 low 73%, PLMI 0.  CPAP 15 cm H2O>>AHI 0, +R.   Marland Kitchen Bronchitis    Past Surgical History  Procedure Laterality Date  . Joint replacement    . Back surgery    . Total hip arthroplasty     Family History  Problem Relation Age of Onset  . Heart attack Father   . Cancer Mother    Social History  Substance Use Topics  . Smoking status: Current Some Day Smoker -- 0.50 packs/day for 39 years    Types: Cigarettes  . Smokeless tobacco: None     Comment: states on and off smoker  . Alcohol Use: No    Review of Systems  Cardiovascular: Negative for near-syncope.  Musculoskeletal: Negative for neck pain.  Neurological: Positive for headaches.  All other systems reviewed and are negative.     Allergies  Review of patient's allergies indicates no known allergies.  Home Medications   Prior to Admission medications   Medication Sig  Start Date End Date Taking? Authorizing Provider  albuterol (PROVENTIL HFA;VENTOLIN HFA) 108 (90 BASE) MCG/ACT inhaler Inhale 1-2 puffs into the lungs every 6 (six) hours as needed for wheezing or shortness of breath. Patient not taking: Reported on 05/19/2014 04/17/14   Hyman Bible, PA-C  azithromycin (ZITHROMAX) 250 MG tablet Take 1 tablet (250 mg total) by mouth daily. Take first 2 tablets together, then 1 every day until finished. 05/06/14   Waldemar Dickens, MD  cyclobenzaprine (FLEXERIL) 10 MG tablet Take 1 tablet (10 mg total) by mouth 2 (two) times daily as needed for muscle spasms. Patient not taking: Reported on 05/19/2014 03/25/14   Delos Haring, PA-C  diazepam (VALIUM) 2 MG tablet Take 1 tablet (2 mg total) by mouth every 8 (eight) hours as needed for anxiety (and dizziness). 06/05/15   Margarita Mail, PA-C  guaiFENesin-codeine 100-10 MG/5ML syrup Take 5 mLs by mouth 3 (three) times daily as needed for cough. 04/17/14   Heather Laisure, PA-C  HYDROcodone-homatropine (HYCODAN) 5-1.5 MG/5ML syrup Take 5 mLs by mouth every 6 (six) hours as needed. 05/19/14   Alvina Chou, PA-C  hydrocortisone cream 1 % Apply to affected area 2 times daily 07/24/14   Dahlia Bailiff, PA-C  hydrOXYzine (ATARAX/VISTARIL) 25 MG tablet Take 1 tablet (25 mg total) by mouth every 6 (six) hours. 08/12/14   Blanchie Dessert, MD  ketoconazole (NIZORAL) 2 % cream Apply 1 application topically 2 (  two) times daily. 08/12/14   Blanchie Dessert, MD  levofloxacin (LEVAQUIN) 750 MG tablet Take 1 tablet (750 mg total) by mouth daily. 05/19/14   Kaitlyn Szekalski, PA-C  lisinopril (PRINIVIL,ZESTRIL) 10 MG tablet Take 20 mg by mouth every morning.     Historical Provider, MD  loratadine (CLARITIN) 10 MG tablet Take 1 tablet (10 mg total) by mouth daily. One po daily x 5 days 07/24/14   Dahlia Bailiff, PA-C  naproxen (NAPROSYN) 500 MG tablet Take 1 tablet (500 mg total) by mouth 2 (two) times daily. 06/05/15   Margarita Mail, PA-C   oxyCODONE-acetaminophen (PERCOCET) 10-325 MG per tablet Take 1 tablet by mouth 3 (three) times daily as needed for pain.  03/06/14   Historical Provider, MD  Skin Protectants, Misc. (EUCERIN) cream Apply topically as needed for wound care. 07/24/14   Dahlia Bailiff, PA-C   BP 123/67 mmHg  Pulse 73  Temp(Src) 98 F (36.7 C) (Oral)  Resp 16  SpO2 96% Physical Exam  Constitutional: He is oriented to person, place, and time. He appears well-developed and well-nourished.  HENT:  Head: Normocephalic and atraumatic.  Right Ear: External ear normal.  Left Ear: External ear normal.  Nose: Nose normal.  Eyes: Conjunctivae and EOM are normal. Pupils are equal, round, and reactive to light.  Neck: Normal range of motion.  Cardiovascular: Normal rate and normal heart sounds.   Pulmonary/Chest: Effort normal.  Abdominal: Soft. He exhibits no distension.  Musculoskeletal: Normal range of motion.  Neurological: He is alert and oriented to person, place, and time.  Skin: Skin is warm.  Psychiatric: He has a normal mood and affect.  Nursing note and vitals reviewed.   ED Course  Procedures (including critical care time) Labs Review Labs Reviewed - No data to display  Imaging Review No results found. I have personally reviewed and evaluated these images and lab results as part of my medical decision-making.   EKG Interpretation None      MDM Pt given torodol IM.  Pt advised he may continue to have headaches and that he will need to follow up with neurology as advised.  Pt has referral information with him.  Pt is in pain management.  Pt advised to continue his pain management medications and recent medications.     Final diagnoses:  Post-traumatic headache, not intractable, unspecified chronicity pattern    An After Visit Summary was printed and given to the patient.    Hollace Kinnier [place], PA-C 06/12/15 1813  Dorie Rank, MD 06/13/15 [phone]

## 2015-06-12 NOTE — Discharge Instructions (Signed)
Post-Concussion Syndrome  Post-concussion syndrome describes the symptoms that can occur after a head injury. These symptoms can last from weeks to months.  CAUSES   It is not clear why some head injuries cause post-concussion syndrome. It can occur whether your head injury was mild or severe and whether you were wearing head protection or not.   SIGNS AND SYMPTOMS  · Memory difficulties.  · Dizziness.  · Headaches.  · Double vision or blurry vision.  · Sensitivity to light.  · Hearing difficulties.  · Depression.  · Tiredness.  · Weakness.  · Difficulty with concentration.  · Difficulty sleeping or staying asleep.  · Vomiting.  · Poor balance or instability on your feet.  · Slow reaction time.  · Difficulty learning and remembering things you have heard.  DIAGNOSIS   There is no test to determine whether you have post-concussion syndrome. Your health care provider may order an imaging scan of your brain, such as a CT scan, to check for other problems that may be causing your symptoms (such as a severe injury inside your skull).  TREATMENT   Usually, these problems disappear over time without medical care. Your health care provider may prescribe medicine to help ease your symptoms. It is important to follow up with a neurologist to evaluate your recovery and address any lingering symptoms or issues.  HOME CARE INSTRUCTIONS   · Take medicines only as directed by your health care provider. Do not take aspirin. Aspirin can slow blood clotting.  · Sleep with your head slightly elevated to help with headaches.  · Avoid any situation where there is potential for another head injury. This includes football, hockey, soccer, basketball, martial arts, downhill snow sports, and horseback riding. Your condition will get worse every time you experience a concussion. You should avoid these activities until you are evaluated by the appropriate follow-up health care providers.  · Keep all follow-up visits as directed by your health  care provider. This is important.  SEEK MEDICAL CARE IF:  · You have increased problems paying attention or concentrating.  · You have increased difficulty remembering or learning new information.  · You need more time to complete tasks or assignments than before.  · You have increased irritability or decreased ability to cope with stress.  · You have more symptoms than before.  Seek medical care if you have any of the following symptoms for more than two weeks after your injury:  · Lasting (chronic) headaches.  · Dizziness or balance problems.  · Nausea.  · Vision problems.  · Increased sensitivity to noise or light.  · Depression or mood swings.  · Anxiety or irritability.  · Memory problems.  · Difficulty concentrating or paying attention.  · Sleep problems.  · Feeling tired all the time.  SEEK IMMEDIATE MEDICAL CARE IF:  · You have confusion or unusual drowsiness.  · Others find it difficult to wake you up.  · You have nausea or persistent, forceful vomiting.  · You feel like you are moving when you are not (vertigo). Your eyes may move rapidly back and forth.  · You have convulsions or faint.  · You have severe, persistent headaches that are not relieved by medicine.  · You cannot use your arms or legs normally.  · One of your pupils is larger than the other.  · You have clear or bloody discharge from your nose or ears.  · Your problems are getting worse, not better.  MAKE   SURE YOU:  · Understand these instructions.  · Will watch your condition.  · Will get help right away if you are not doing well or get worse.     This information is not intended to replace advice given to you by your health care provider. Make sure you discuss any questions you have with your health care provider.     Document Released: 12/10/2001 Document Revised: 07/11/2014 Document Reviewed: 09/25/2013  Elsevier Interactive Patient Education ©2016 [employer]

## 2015-06-12 NOTE — ED Notes (Signed)
Pt was seen here on Dec 2nd for headaches, told to come back if headaches dont go away. Pt 7/10, no neuro deficits noted, pt ambulatory with steady gait. AAOX4 in [place]

## 2015-09-27 ENCOUNTER — Emergency Department (HOSPITAL_COMMUNITY): Payer: Medicare Other

## 2015-09-27 ENCOUNTER — Emergency Department (HOSPITAL_COMMUNITY)
Admission: EM | Admit: 2015-09-27 | Discharge: 2015-09-28 | Disposition: A | Discharge status: Home / Self Care | Payer: Medicare Other | Attending: Emergency Medicine | Admitting: Emergency Medicine

## 2015-09-27 ENCOUNTER — Encounter (HOSPITAL_COMMUNITY): Payer: Self-pay | Admitting: Emergency Medicine

## 2015-09-27 DIAGNOSIS — J40 Bronchitis, not specified as acute or chronic: Secondary | ICD-10-CM | POA: Diagnosis not present

## 2015-09-27 DIAGNOSIS — F17210 Nicotine dependence, cigarettes, uncomplicated: Secondary | ICD-10-CM | POA: Insufficient documentation

## 2015-09-27 DIAGNOSIS — I10 Essential (primary) hypertension: Secondary | ICD-10-CM | POA: Diagnosis not present

## 2015-09-27 DIAGNOSIS — Z79891 Long term (current) use of opiate analgesic: Secondary | ICD-10-CM | POA: Diagnosis not present

## 2015-09-27 DIAGNOSIS — G4733 Obstructive sleep apnea (adult) (pediatric): Secondary | ICD-10-CM | POA: Insufficient documentation

## 2015-09-27 DIAGNOSIS — Z8639 Personal history of other endocrine, nutritional and metabolic disease: Secondary | ICD-10-CM | POA: Diagnosis not present

## 2015-09-27 DIAGNOSIS — Z79899 Other long term (current) drug therapy: Secondary | ICD-10-CM | POA: Diagnosis not present

## 2015-09-27 DIAGNOSIS — Z9981 Dependence on supplemental oxygen: Secondary | ICD-10-CM | POA: Insufficient documentation

## 2015-09-27 DIAGNOSIS — R079 Chest pain, unspecified: Secondary | ICD-10-CM | POA: Diagnosis present

## 2015-09-27 LAB — CBC
HEMATOCRIT: 43.5 % (ref 39.0–52.0)
HEMOGLOBIN: 14.6 g/dL (ref 13.0–17.0)
MCH: 32.4 pg (ref 26.0–34.0)
MCHC: 33.6 g/dL (ref 30.0–36.0)
MCV: 96.5 fL (ref 78.0–100.0)
Platelets: 245 10*3/uL (ref 150–400)
RBC: 4.51 MIL/uL (ref 4.22–5.81)
RDW: 12.4 % (ref 11.5–15.5)
WBC: 7.7 10*3/uL (ref 4.0–10.5)

## 2015-09-27 LAB — BASIC METABOLIC PANEL
ANION GAP: 10 (ref 5–15)
BUN: 10 mg/dL (ref 6–20)
CHLORIDE: 104 mmol/L (ref 101–111)
CO2: 28 mmol/L (ref 22–32)
Calcium: 10.1 mg/dL (ref 8.9–10.3)
Creatinine, Ser: 0.97 mg/dL (ref 0.61–1.24)
GFR calc non Af Amer: >60 mL/min (ref >60)
GLUCOSE: 106 mg/dL — AB (ref 65–99)
POTASSIUM: 3.5 mmol/L (ref 3.5–5.1)
Sodium: 142 mmol/L (ref 135–145)

## 2015-09-27 LAB — I-STAT TROPONIN, ED: TROPONIN I, POC: 0.01 ng/mL (ref 0.00–0.08)

## 2015-09-27 MED ORDER — PREDNISONE 20 MG PO TABS
60.0000 mg | ORAL_TABLET | ORAL | Status: AC
  Administered 2015-09-27: 60 mg via ORAL
  Filled 2015-09-27: qty 3

## 2015-09-27 MED ORDER — PREDNISONE 20 MG PO TABS: 40.0000 mg | ORAL_TABLET | Freq: Every day | ORAL | Status: DC

## 2015-09-27 MED ORDER — ALBUTEROL SULFATE HFA 108 (90 BASE) MCG/ACT IN AERS
2.0000 | INHALATION_SPRAY | Freq: Four times a day (QID) | RESPIRATORY_TRACT | Status: DC
  Administered 2015-09-28: 2 via RESPIRATORY_TRACT
  Filled 2015-09-27: qty 6.7

## 2015-09-27 NOTE — ED Notes (Signed)
Pt states he has had a productive cough x3 days with thick yellow mucus. Pt states he has also had aching in the center of his chest that is worse when coughing.pt denies any chills, fever or body aches.

## 2015-09-27 NOTE — ED Provider Notes (Signed)
CSN: [id number]     Arrival date & time 09/27/15  1956 History   First MD Initiated Contact with Patient 09/27/15 2220     Chief Complaint  Patient presents with  . Chest Pain  . Cough     (Consider location/radiation/quality/duration/timing/severity/associated sxs/prior Treatment) HPI Patient presents with concern of ongoing chest discomfort, cough, generalized his comfort. Symptoms began about 3 days ago. Since onset symptoms of been persistent, and the patient states that he has taken no medication for symptom relief. No fever, chills, nausea, vomiting, diarrhea, myalgia. No headache, confusion, disorientation. Patient was well prior to the onset of symptoms. Patient is a smoker, though he has not smoked in several days.  Past Medical History  Diagnosis Date  . Hypertension   . Hyperlipidemia   . OSA (obstructive sleep apnea) 12/09/2011    PSG 01/03/12>>AHI 45.7, SpO2 low 73%, PLMI 0.  CPAP 15 cm H2O>>AHI 0, +R.   Marland Kitchen Bronchitis    Past Surgical History  Procedure Laterality Date  . Joint replacement    . Back surgery    . Total hip arthroplasty     Family History  Problem Relation Age of Onset  . Heart attack Father   . Cancer Mother    Social History  Substance Use Topics  . Smoking status: Current Some Day Smoker -- 0.50 packs/day for 39 years    Types: Cigarettes  . Smokeless tobacco: None     Comment: states on and off smoker  . Alcohol Use: No    Review of Systems  Constitutional:       Per HPI, otherwise negative  HENT:       Per HPI, otherwise negative  Respiratory:       Per HPI, otherwise negative  Cardiovascular:       Per HPI, otherwise negative  Gastrointestinal: Negative for vomiting.  Endocrine:       Negative aside from HPI  Genitourinary:       Neg aside from HPI   Musculoskeletal:       Per HPI, otherwise negative  Skin: Negative.   Neurological: Negative for syncope.      Allergies  Review of patient's allergies indicates no  known allergies.  Home Medications   Prior to Admission medications   Medication Sig Start Date End Date Taking? Authorizing Provider  diazepam (VALIUM) 2 MG tablet Take 1 tablet (2 mg total) by mouth every 8 (eight) hours as needed for anxiety (and dizziness). Patient taking differently: Take 2 mg by mouth at bedtime as needed (dizziness).  06/05/15  Yes Margarita Mail, PA-C  diclofenac sodium (VOLTAREN) 1 % GEL Apply 1 application topically daily as needed (pain).   Yes Historical Provider, MD  hydrocortisone cream 1 % Apply to affected area 2 times daily Patient taking differently: Apply 1 application topically 2 (two) times daily as needed for itching.  07/24/14  Yes Dahlia Bailiff, PA-C  lisinopril (PRINIVIL,ZESTRIL) 20 MG tablet Take 20 mg by mouth daily. 04/23/15  Yes Historical Provider, MD  loratadine (CLARITIN) 10 MG tablet Take 1 tablet (10 mg total) by mouth daily. One po daily x 5 days Patient taking differently: Take 10 mg by mouth daily as needed for allergies.  07/24/14  Yes Dahlia Bailiff, PA-C  oxyCODONE-acetaminophen (PERCOCET) 10-325 MG per tablet Take 1 tablet by mouth 3 (three) times daily. scheduled 03/06/14  Yes Historical Provider, MD  albuterol (PROVENTIL HFA;VENTOLIN HFA) 108 (90 BASE) MCG/ACT inhaler Inhale 1-2 puffs into the lungs every 6 (  six) hours as needed for wheezing or shortness of breath. Patient not taking: Reported on 05/19/2014 04/17/14   Hyman Bible, PA-C  hydrOXYzine (ATARAX/VISTARIL) 25 MG tablet Take 1 tablet (25 mg total) by mouth every 6 (six) hours. Patient not taking: Reported on 09/27/2015 08/12/14   Blanchie Dessert, MD  ketoconazole (NIZORAL) 2 % cream Apply 1 application topically 2 (two) times daily. Patient not taking: Reported on 09/27/2015 08/12/14   Blanchie Dessert, MD  naproxen (NAPROSYN) 500 MG tablet Take 1 tablet (500 mg total) by mouth 2 (two) times daily. Patient not taking: Reported on 09/27/2015 06/05/15   Margarita Mail, PA-C  Skin Protectants,  Misc. (EUCERIN) cream Apply topically as needed for wound care. Patient not taking: Reported on 09/27/2015 07/24/14   Dahlia Bailiff, PA-C   BP 132/83 mmHg  Pulse 60  Temp(Src) 98.3 F (36.8 C) (Oral)  Resp 12  Ht 5\' 8"  (1.727 m)  Wt 248 lb 14.4 oz (112.9 kg)  BMI 37.85 kg/m2  SpO2 96% Physical Exam  Constitutional: He is oriented to person, place, and time. He appears well-developed. No distress.  HENT:  Head: Normocephalic and atraumatic.  Eyes: Conjunctivae and EOM are normal.  Cardiovascular: Normal rate and regular rhythm.   Pulmonary/Chest: Effort normal. No stridor. No respiratory distress.  Abdominal: He exhibits no distension.  Musculoskeletal: He exhibits no edema.  Neurological: He is alert and oriented to person, place, and time.  Skin: Skin is warm and dry.  Psychiatric: He has a normal mood and affect.  Nursing note and vitals reviewed.   ED Course  Procedures (including critical care time) Labs Review Labs Reviewed  BASIC METABOLIC PANEL - Abnormal; Notable for the following:    Glucose, Bld 106 (*)    All other components within normal limits  CBC  I-STAT TROPOININ, ED    Imaging Review Dg Chest 2 View  09/27/2015  CLINICAL DATA:  Productive cough for 3 days. Chest pain. Initial encounter. EXAM: CHEST  2 VIEW COMPARISON:  PA and lateral chest 05/19/2014. FINDINGS: The lungs are clear. Heart size is normal. No pneumothorax or pleural effusion. Thoracic spondylosis noted. IMPRESSION: No acute disease. Electronically Signed   By: Inge Rise M.D.   On: 09/27/2015 20:53   I have personally reviewed and evaluated these images and lab results as part of my medical decision-making.   EKG Interpretation   Date/Time:  Sunday September 27 2015 20:03:05 EDT Ventricular Rate:  80 PR Interval:  142 QRS Duration: 90 QT Interval:  378 QTC Calculation: 435 R Axis:   51 Text Interpretation:  Normal sinus rhythm Nonspecific T wave abnormality  Abnormal ECG Sinus rhythm  T wave abnormality similar to study from 2002  Abnormal ekg Confirmed by Carmin Muskrat  MD [phone]) on 09/27/2015  10:35:18 PM     On repeat exam the patient is in no distress, awake, alert. He is aware of all findings, need for medication, will follow-up with primary care. MDM  Well-appearing male presents with 3 days of cough, congestion, chest discomfort. Here, no evidence for ongoing coronary ischemia, with an unchanged EKG, normal troponin. No evidence for bacteremia, sepsis, with reassuring labs. No evidence for pneumonia. Patient may have bronchitis, given that he is a smoker. Patient started on a course of steroids, albuterol, discharged in stable condition.    Carmin Muskrat, MD 09/27/15 [phone]

## 2015-09-27 NOTE — Discharge Instructions (Signed)
As discussed, you have been diagnosed with bronchitis. It is important that you monitor your condition carefully, and using your provided albuterol inhaler every 4 hours for the next 2 days in addition to the prescribed steroids.  Return here for concerning changes in your condition.

## 2015-09-28 DIAGNOSIS — J40 Bronchitis, not specified as acute or chronic: Secondary | ICD-10-CM | POA: Diagnosis not present

## 2015-09-28 NOTE — ED Notes (Signed)
Pt stable, ambulatory, states understanding of discharge instructions 

## 2015-10-27 ENCOUNTER — Emergency Department (HOSPITAL_COMMUNITY): Payer: Medicare Other

## 2015-10-27 ENCOUNTER — Encounter (HOSPITAL_COMMUNITY): Payer: Self-pay | Admitting: Emergency Medicine

## 2015-10-27 ENCOUNTER — Emergency Department (HOSPITAL_COMMUNITY)
Admission: EM | Admit: 2015-10-27 | Discharge: 2015-10-27 | Disposition: A | Discharge status: Home / Self Care | Payer: Medicare Other | Attending: Emergency Medicine | Admitting: Emergency Medicine

## 2015-10-27 DIAGNOSIS — F17210 Nicotine dependence, cigarettes, uncomplicated: Secondary | ICD-10-CM | POA: Insufficient documentation

## 2015-10-27 DIAGNOSIS — M25552 Pain in left hip: Secondary | ICD-10-CM | POA: Insufficient documentation

## 2015-10-27 DIAGNOSIS — Z8639 Personal history of other endocrine, nutritional and metabolic disease: Secondary | ICD-10-CM | POA: Insufficient documentation

## 2015-10-27 DIAGNOSIS — Z96642 Presence of left artificial hip joint: Secondary | ICD-10-CM | POA: Diagnosis not present

## 2015-10-27 DIAGNOSIS — I10 Essential (primary) hypertension: Secondary | ICD-10-CM | POA: Diagnosis not present

## 2015-10-27 DIAGNOSIS — G4733 Obstructive sleep apnea (adult) (pediatric): Secondary | ICD-10-CM | POA: Diagnosis not present

## 2015-10-27 DIAGNOSIS — M1990 Unspecified osteoarthritis, unspecified site: Secondary | ICD-10-CM | POA: Insufficient documentation

## 2015-10-27 DIAGNOSIS — Z8709 Personal history of other diseases of the respiratory system: Secondary | ICD-10-CM | POA: Diagnosis not present

## 2015-10-27 DIAGNOSIS — Z79899 Other long term (current) drug therapy: Secondary | ICD-10-CM | POA: Insufficient documentation

## 2015-10-27 MED ORDER — TRAMADOL HCL 50 MG PO TABS: 50.0000 mg | ORAL_TABLET | Freq: Four times a day (QID) | ORAL | Status: DC | PRN

## 2015-10-27 MED ORDER — KETOROLAC TROMETHAMINE 60 MG/2ML IM SOLN
60.0000 mg | Freq: Once | INTRAMUSCULAR | Status: AC
  Administered 2015-10-27: 60 mg via INTRAMUSCULAR
  Filled 2015-10-27: qty 2

## 2015-10-27 MED ORDER — OXYCODONE-ACETAMINOPHEN 5-325 MG PO TABS
1.0000 | ORAL_TABLET | Freq: Once | ORAL | Status: AC
  Administered 2015-10-27: 1 via ORAL
  Filled 2015-10-27: qty 1

## 2015-10-27 NOTE — ED Notes (Signed)
Pt arrives from home reporting L hip pain beginning last night, denies falls, injury, trauma.  Pt reports hip replacement on that side 2001, denies any previous complaint.  Audible popping sound noted with movement.

## 2015-10-27 NOTE — ED Notes (Signed)
Marval Regal, PA, at bedside.

## 2015-10-27 NOTE — Discharge Instructions (Signed)
You have been seen today for hip pain. The x-ray shows that the classic piece in the socket has worn and broken. You will need to follow up with Rodell Perna in the office. Call the number provided to set up an appointment. In the meantime, stay off of the hip as much as possible and use crutches for getting around. Follow up with PCP as needed. Return to ED should symptoms worsen.

## 2015-10-27 NOTE — ED Provider Notes (Signed)
CSN: [id number]     Arrival date & time 10/27/15  0804 History   First MD Initiated Contact with Patient 10/27/15 0809     Chief Complaint  Patient presents with  . Hip Pain     (Consider location/radiation/quality/duration/timing/severity/associated sxs/prior Treatment) HPI  Peter Manning is a 60 y.o. male, with a history of hypertension and left hip replacement, presenting to the ED with left hip pain and popping that began last night. Patient states that he was walking to the bathroom when it felt like his hip "popped out." Patient has not been able to fully bear weight on that side since the incident. Patient denies falls or trauma. Patient states that he had a total left hip replacement performed in 2001 by Rodell Perna and was told at that time that the hardware may need to be replaced in 10 years. Patient currently rates his pain at 10 out of 10, aching, nonradiating. Pain is located in the left lateral and posterior hip. Patient states that he does not usually have pain in that hip. Patient denies fever/chills, neuro deficits, or any other complaints.    Past Medical History  Diagnosis Date  . Hypertension   . Hyperlipidemia   . OSA (obstructive sleep apnea) 12/09/2011    PSG 01/03/12>>AHI 45.7, SpO2 low 73%, PLMI 0.  CPAP 15 cm H2O>>AHI 0, +R.   Marland Kitchen Bronchitis   . Arthritis    Past Surgical History  Procedure Laterality Date  . Joint replacement    . Back surgery    . Total hip arthroplasty     Family History  Problem Relation Age of Onset  . Heart attack Father   . Cancer Mother    Social History  Substance Use Topics  . Smoking status: Current Some Day Smoker -- 0.50 packs/day for 39 years    Types: Cigarettes  . Smokeless tobacco: None     Comment: states on and off smoker  . Alcohol Use: No    Review of Systems  Constitutional: Negative for fever and chills.  Gastrointestinal: Negative for nausea and vomiting.  Musculoskeletal: Positive for arthralgias (left  hip). Negative for back pain.  Neurological: Negative for weakness and numbness.  All other systems reviewed and are negative.     Allergies  Review of patient's allergies indicates no known allergies.  Home Medications   Prior to Admission medications   Medication Sig Start Date End Date Taking? Authorizing Provider  diazepam (VALIUM) 2 MG tablet Take 1 tablet (2 mg total) by mouth every 8 (eight) hours as needed for anxiety (and dizziness). Patient taking differently: Take 2 mg by mouth at bedtime as needed (dizziness).  06/05/15  Yes Margarita Mail, PA-C  diclofenac sodium (VOLTAREN) 1 % GEL Apply 1 application topically daily as needed (pain).   Yes Historical Provider, MD  hydrocortisone cream 1 % Apply to affected area 2 times daily Patient taking differently: Apply 1 application topically 2 (two) times daily as needed for itching.  07/24/14  Yes Dahlia Bailiff, PA-C  lisinopril (PRINIVIL,ZESTRIL) 20 MG tablet Take 20 mg by mouth daily. 04/23/15  Yes Historical Provider, MD  oxyCODONE-acetaminophen (PERCOCET) 10-325 MG per tablet Take 1 tablet by mouth 3 (three) times daily. scheduled 03/06/14  Yes Historical Provider, MD  albuterol (PROVENTIL HFA;VENTOLIN HFA) 108 (90 BASE) MCG/ACT inhaler Inhale 1-2 puffs into the lungs every 6 (six) hours as needed for wheezing or shortness of breath. Patient not taking: Reported on 05/19/2014 04/17/14   Hyman Bible, PA-C  hydrOXYzine (ATARAX/VISTARIL) 25 MG tablet Take 1 tablet (25 mg total) by mouth every 6 (six) hours. Patient not taking: Reported on 09/27/2015 08/12/14   Blanchie Dessert, MD  ketoconazole (NIZORAL) 2 % cream Apply 1 application topically 2 (two) times daily. Patient not taking: Reported on 09/27/2015 08/12/14   Blanchie Dessert, MD  loratadine (CLARITIN) 10 MG tablet Take 1 tablet (10 mg total) by mouth daily. One po daily x 5 days Patient not taking: Reported on 10/27/2015 07/24/14   Dahlia Bailiff, PA-C  traMADol (ULTRAM) 50 MG tablet Take  1 tablet (50 mg total) by mouth every 6 (six) hours as needed. 10/27/15   Jeremie Abdelaziz C Zein Helbing, PA-C   BP 158/91 mmHg  Pulse 54  Temp(Src) 97.6 F (36.4 C) (Oral)  Resp 16  Ht 5\' 8"  (1.727 m)  Wt 110.224 kg  BMI 36.96 kg/m2  SpO2 96% Physical Exam  Constitutional: He appears well-developed and well-nourished. No distress.  HENT:  Head: Normocephalic and atraumatic.  Eyes: Conjunctivae are normal. Pupils are equal, round, and reactive to light.  Neck: Neck supple.  Cardiovascular: Normal rate, regular rhythm, normal heart sounds and intact distal pulses.   Pulmonary/Chest: Effort normal and breath sounds normal. No respiratory distress.  Abdominal: Soft. There is no tenderness.  Musculoskeletal: He exhibits no edema or tenderness.  Painful range of motion to the left hip. Flexion of the left hip elicits a loud pop and shift in the hip around 60-90.  Tenderness to the left lateral and posterior hip. Pelvis appears to be stable.   Lymphadenopathy:    He has no cervical adenopathy.  Neurological: He is alert. He has normal reflexes.  No sensory deficits. Strength 5/5 in left lower extremity. No gait disturbance. Coordination intact.   Skin: Skin is warm and dry. He is not diaphoretic.  Psychiatric: He has a normal mood and affect. His behavior is normal.  Nursing note and vitals reviewed.   ED Course  Procedures (including critical care time)  Imaging Review Dg Hip Unilat With Pelvis 2-3 Views Left  10/27/2015  CLINICAL DATA:  Left hip pain and popping for 2 days. Prior hip replacement. EXAM: DG HIP (WITH OR WITHOUT PELVIS) 2-3V LEFT COMPARISON:  02/18/2014 FINDINGS: Sequelae of left total hip arthroplasty are again identified. There is no evidence of dislocation or periprosthetic fracture. There is new displacement of the polyethylene liner which appears rotated dorsally. Mild right hip osteoarthrosis is again seen. Lumbosacral fusion instrumentation is partially visualized. Pelvic  phleboliths are noted. IMPRESSION: 1. Prior left total hip arthroplasty with new displacement of the polyethylene liner. 2. No acute fracture or dislocation. Electronically Signed   By: Logan Bores M.D.   On: 10/27/2015 09:36   I have personally reviewed and evaluated these images as part of my medical decision-making.   EKG Interpretation None      Medications  oxyCODONE-acetaminophen (PERCOCET/ROXICET) 5-325 MG per tablet 1 tablet (1 tablet Oral Given 10/27/15 0833)  ketorolac (TORADOL) injection 60 mg (60 mg Intramuscular Given 10/27/15 0834)    MDM   Final diagnoses:  Left hip pain  History of hip replacement, total, left    [name] presents with left hip pain and popping that began last night.  Findings and plan of care discussed with Carmin Muskrat, MD. Dr. Vanita Panda personally evaluated and examined this patient.  This patient's presentation is suspicious for a possible left hip intermittent dislocation due to the breakdown of the patient's hardware. Patient's pain controlled with conservative management here  in the ED. X-ray results as above. 10:17 AM Spoke with Dr. Lorin Mercy, orthopedic surgeon on call, states the patient will need to be put on crutches and follow up in the office.  This information was communicated with patient. Return precautions discussed. Patient voiced understanding of these instructions, agrees to the plan, and is comfortable with discharge.   Filed Vitals:   10/27/15 0830 10/27/15 0845 10/27/15 0945 10/27/15 1000  BP: 138/87 128/86 131/84 135/82  Pulse: 60 59 50 50  Temp:      TempSrc:      Resp:      Height:      Weight:      SpO2: 96% 98% 95% 95%   Filed Vitals:   10/27/15 0945 10/27/15 1000 10/27/15 1030 10/27/15 1039  BP: 131/84 135/82 158/91   Pulse: 50 50 54   Temp:    97.6 F (36.4 C)  TempSrc:    Oral  Resp:    16  Height:      Weight:      SpO2: 95% 95% 96%         Lorayne Bender, PA-C 10/27/15 [place],  MD 10/28/15 1057

## 2015-11-02 HISTORY — PX: REVISION TOTAL HIP ARTHROPLASTY: SHX766

## 2015-11-05 ENCOUNTER — Encounter (HOSPITAL_COMMUNITY): Payer: Self-pay | Admitting: *Deleted

## 2015-11-05 ENCOUNTER — Emergency Department (HOSPITAL_COMMUNITY)
Admission: EM | Admit: 2015-11-05 | Discharge: 2015-11-05 | Disposition: A | Discharge status: Home / Self Care | Payer: Medicare Other | Attending: Emergency Medicine | Admitting: Emergency Medicine

## 2015-11-05 ENCOUNTER — Emergency Department (HOSPITAL_COMMUNITY): Payer: Medicare Other

## 2015-11-05 DIAGNOSIS — I10 Essential (primary) hypertension: Secondary | ICD-10-CM | POA: Diagnosis not present

## 2015-11-05 DIAGNOSIS — Z79899 Other long term (current) drug therapy: Secondary | ICD-10-CM | POA: Insufficient documentation

## 2015-11-05 DIAGNOSIS — S4991XA Unspecified injury of right shoulder and upper arm, initial encounter: Secondary | ICD-10-CM | POA: Diagnosis not present

## 2015-11-05 DIAGNOSIS — Y998 Other external cause status: Secondary | ICD-10-CM | POA: Insufficient documentation

## 2015-11-05 DIAGNOSIS — G4733 Obstructive sleep apnea (adult) (pediatric): Secondary | ICD-10-CM | POA: Insufficient documentation

## 2015-11-05 DIAGNOSIS — S3992XA Unspecified injury of lower back, initial encounter: Secondary | ICD-10-CM | POA: Diagnosis not present

## 2015-11-05 DIAGNOSIS — F17210 Nicotine dependence, cigarettes, uncomplicated: Secondary | ICD-10-CM | POA: Diagnosis not present

## 2015-11-05 DIAGNOSIS — Y92002 Bathroom of unspecified non-institutional (private) residence single-family (private) house as the place of occurrence of the external cause: Secondary | ICD-10-CM | POA: Diagnosis not present

## 2015-11-05 DIAGNOSIS — T148XXA Other injury of unspecified body region, initial encounter: Secondary | ICD-10-CM

## 2015-11-05 DIAGNOSIS — J069 Acute upper respiratory infection, unspecified: Secondary | ICD-10-CM | POA: Diagnosis not present

## 2015-11-05 DIAGNOSIS — Z8639 Personal history of other endocrine, nutritional and metabolic disease: Secondary | ICD-10-CM | POA: Insufficient documentation

## 2015-11-05 DIAGNOSIS — M1990 Unspecified osteoarthritis, unspecified site: Secondary | ICD-10-CM | POA: Insufficient documentation

## 2015-11-05 DIAGNOSIS — R05 Cough: Secondary | ICD-10-CM | POA: Diagnosis present

## 2015-11-05 DIAGNOSIS — W1839XA Other fall on same level, initial encounter: Secondary | ICD-10-CM | POA: Diagnosis not present

## 2015-11-05 DIAGNOSIS — T148 Other injury of unspecified body region: Secondary | ICD-10-CM | POA: Diagnosis not present

## 2015-11-05 DIAGNOSIS — Y9389 Activity, other specified: Secondary | ICD-10-CM | POA: Diagnosis not present

## 2015-11-05 MED ORDER — HYDROCODONE-ACETAMINOPHEN 5-325 MG PO TABS: 1.0000 | ORAL_TABLET | ORAL | Status: DC | PRN

## 2015-11-05 NOTE — Discharge Instructions (Signed)
Upper Respiratory Infection, Adult Most upper respiratory infections (URIs) are caused by a virus. A URI affects the nose, throat, and upper air passages. The most common type of URI is often called "the common cold." HOME CARE   Take medicines only as told by your doctor.  Gargle warm saltwater or take cough drops to comfort your throat as told by your doctor.  Use a warm mist humidifier or inhale steam from a shower to increase air moisture. This may make it easier to breathe.  Drink enough fluid to keep your pee (urine) clear or pale yellow.  Eat soups and other clear broths.  Have a healthy diet.  Rest as needed.  Go back to work when your fever is gone or your doctor says it is okay.  You may need to stay home longer to avoid giving your URI to others.  You can also wear a face mask and wash your hands often to prevent spread of the virus.  Use your inhaler more if you have asthma.  Do not use any tobacco products, including cigarettes, chewing tobacco, or electronic cigarettes. If you need help quitting, ask your doctor. GET HELP IF:  You are getting worse, not better.  Your symptoms are not helped by medicine.  You have chills.  You are getting more short of breath.  You have brown or red mucus.  You have yellow or brown discharge from your nose.  You have pain in your face, especially when you bend forward.  You have a fever.  You have puffy (swollen) neck glands.  You have pain while swallowing.  You have white areas in the back of your throat. GET HELP RIGHT AWAY IF:   You have very bad or constant:  Headache.  Ear pain.  Pain in your forehead, behind your eyes, and over your cheekbones (sinus pain).  Chest pain.  You have long-lasting (chronic) lung disease and any of the following:  Wheezing.  Long-lasting cough.  Coughing up blood.  A change in your usual mucus.  You have a stiff neck.  You have changes in  your:  Vision.  Hearing.  Thinking.  Mood. MAKE SURE YOU:   Understand these instructions.  Will watch your condition.  Will get help right away if you are not doing well or get worse.   This information is not intended to replace advice given to you by your health care provider. Make sure you discuss any questions you have with your health care provider.   Document Released: 12/07/2007 Document Revised: 11/04/2014 Document Reviewed: 09/25/2013 Elsevier Interactive Patient Education 2016 [place] A contusion is a deep bruise. Contusions are the result of a blunt injury to tissues and muscle fibers under the skin. The injury causes bleeding under the skin. The skin overlying the contusion may turn blue, purple, or yellow. Minor injuries will give you a painless contusion, but more severe contusions may stay painful and swollen for a few weeks.  CAUSES  This condition is usually caused by a blow, trauma, or direct force to an area of the body. SYMPTOMS  Symptoms of this condition include:  Swelling of the injured area.  Pain and tenderness in the injured area.  Discoloration. The area may have redness and then turn blue, purple, or yellow. DIAGNOSIS  This condition is diagnosed based on a physical exam and medical history. An X-ray, CT scan, or MRI may be needed to determine if there are any associated injuries, such as broken  bones (fractures). TREATMENT  Specific treatment for this condition depends on what area of the body was injured. In general, the best treatment for a contusion is resting, icing, applying pressure to (compression), and elevating the injured area. This is often called the RICE strategy. Over-the-counter anti-inflammatory medicines may also be recommended for pain control.  HOME CARE INSTRUCTIONS   Rest the injured area.  If directed, apply ice to the injured area:  Put ice in a plastic bag.  Place a towel between your skin and the  bag.  Leave the ice on for 20 minutes, 2-3 times per day.  If directed, apply light compression to the injured area using an elastic bandage. Make sure the bandage is not wrapped too tightly. Remove and reapply the bandage as directed by your health care provider.  If possible, raise (elevate) the injured area above the level of your heart while you are sitting or lying down.  Take over-the-counter and prescription medicines only as told by your health care provider. SEEK MEDICAL CARE IF:  Your symptoms do not improve after several days of treatment.  Your symptoms get worse.  You have difficulty moving the injured area. SEEK IMMEDIATE MEDICAL CARE IF:   You have severe pain.  You have numbness in a hand or foot.  Your hand or foot turns pale or cold.   This information is not intended to replace advice given to you by your health care provider. Make sure you discuss any questions you have with your health care provider.   Document Released: 03/30/2005 Document Revised: 03/11/2015 Document Reviewed: 11/05/2014 Elsevier Interactive Patient Education [employer].

## 2015-11-05 NOTE — ED Provider Notes (Signed)
CSN: [id number]     Arrival date & time 11/05/15  0013 History  By signing my name below, I, Irene Pap, attest that this documentation has been prepared under the direction and in the presence of Orpah Greek, MD. Electronically Signed: Irene Pap, ED Scribe. 11/05/2015. 3:24 AM.  Chief Complaint  Patient presents with  . Fall   The history is provided by the patient. No language interpreter was used.  HPI Comments: Peter Manning is a 60 y.o. Male with a hx of HTN and arthritis who presents to the Emergency Department complaining of a fall onset. Pt reports that he was going to the bathroom when he began to cough, causing him to fall. He reports associated right shoulder and back pain. He has begun to cough up white sputum. Pt states that he is due to have a left hip replacement and believes his joint is out of place, causing him to fall. Pt is a smoker. He denies neck pain, hitting head, LOC, numbness, or weakness.    Past Medical History  Diagnosis Date  . Hypertension   . Hyperlipidemia   . OSA (obstructive sleep apnea) 12/09/2011    PSG 01/03/12>>AHI 45.7, SpO2 low 73%, PLMI 0.  CPAP 15 cm H2O>>AHI 0, +R.   Marland Kitchen Bronchitis   . Arthritis    Past Surgical History  Procedure Laterality Date  . Joint replacement    . Back surgery    . Total hip arthroplasty     Family History  Problem Relation Age of Onset  . Heart attack Father   . Cancer Mother    Social History  Substance Use Topics  . Smoking status: Current Some Day Smoker -- 0.50 packs/day for 39 years    Types: Cigarettes  . Smokeless tobacco: None     Comment: states on and off smoker  . Alcohol Use: No    Review of Systems  Musculoskeletal: Positive for back pain and arthralgias. Negative for neck pain.  Neurological: Negative for syncope, weakness, numbness and headaches.  All other systems reviewed and are negative.  Allergies  Review of patient's allergies indicates no known allergies.  Home  Medications   Prior to Admission medications   Medication Sig Start Date End Date Taking? Authorizing Provider  lisinopril (PRINIVIL,ZESTRIL) 20 MG tablet Take 20 mg by mouth daily. 04/23/15  Yes Historical Provider, MD  oxyCODONE-acetaminophen (PERCOCET) 10-325 MG per tablet Take 1 tablet by mouth 3 (three) times daily. scheduled 03/06/14  Yes Historical Provider, MD  albuterol (PROVENTIL HFA;VENTOLIN HFA) 108 (90 BASE) MCG/ACT inhaler Inhale 1-2 puffs into the lungs every 6 (six) hours as needed for wheezing or shortness of breath. Patient not taking: Reported on 05/19/2014 04/17/14   Hyman Bible, PA-C  diazepam (VALIUM) 2 MG tablet Take 1 tablet (2 mg total) by mouth every 8 (eight) hours as needed for anxiety (and dizziness). Patient not taking: Reported on 11/05/2015 06/05/15   Margarita Mail, PA-C  hydrocortisone cream 1 % Apply to affected area 2 times daily Patient not taking: Reported on 11/05/2015 07/24/14   Dahlia Bailiff, PA-C  hydrOXYzine (ATARAX/VISTARIL) 25 MG tablet Take 1 tablet (25 mg total) by mouth every 6 (six) hours. Patient not taking: Reported on 09/27/2015 08/12/14   Blanchie Dessert, MD  ketoconazole (NIZORAL) 2 % cream Apply 1 application topically 2 (two) times daily. Patient not taking: Reported on 09/27/2015 08/12/14   Blanchie Dessert, MD  loratadine (CLARITIN) 10 MG tablet Take 1 tablet (10 mg total) by mouth  daily. One po daily x 5 days Patient not taking: Reported on 10/27/2015 07/24/14   Dahlia Bailiff, PA-C  traMADol (ULTRAM) 50 MG tablet Take 1 tablet (50 mg total) by mouth every 6 (six) hours as needed. Patient not taking: Reported on 11/05/2015 10/27/15   Shawn C Joy, PA-C   BP 142/78 mmHg  Pulse 69  Temp(Src) 98.5 F (36.9 C) (Oral)  Resp 20  Ht 5\' 8"  (1.727 m)  Wt 239 lb (108.41 kg)  BMI 36.35 kg/m2  SpO2 96% Physical Exam  Constitutional: He is oriented to person, place, and time. He appears well-developed and well-nourished. No distress.  HENT:  Head: Normocephalic  and atraumatic.  Right Ear: Hearing normal.  Left Ear: Hearing normal.  Nose: Nose normal.  Mouth/Throat: Oropharynx is clear and moist and mucous membranes are normal.  Eyes: Conjunctivae and EOM are normal. Pupils are equal, round, and reactive to light.  Neck: Normal range of motion. Neck supple.  Cardiovascular: Regular rhythm, S1 normal and S2 normal.  Exam reveals no gallop and no friction rub.   No murmur heard. Pulmonary/Chest: Effort normal and breath sounds normal. No respiratory distress. He exhibits no tenderness.  Abdominal: Soft. Normal appearance and bowel sounds are normal. There is no hepatosplenomegaly. There is no tenderness. There is no rebound, no guarding, no tenderness at McBurney's point and negative Murphy's sign. No hernia.  Musculoskeletal: Normal range of motion.  Neurological: He is alert and oriented to person, place, and time. He has normal strength. No cranial nerve deficit or sensory deficit. Coordination normal. GCS eye subscore is 4. GCS verbal subscore is 5. GCS motor subscore is 6.  Skin: Skin is warm, dry and intact. No rash noted. No cyanosis.  Psychiatric: He has a normal mood and affect. His speech is normal and behavior is normal. Thought content normal.  Nursing note and vitals reviewed.   ED Course  Procedures (including critical care time) DIAGNOSTIC STUDIES: Oxygen Saturation is 96% on RA, normal by my interpretation.    COORDINATION OF CARE: 3:22 AM-Discussed treatment plan which includes x-ray with pt at bedside and pt agreed to plan.    Labs Review Labs Reviewed - No data to display  Imaging Review Dg Chest 2 View  11/05/2015  CLINICAL DATA:  [place] tonight while going to the bathroom. EXAM: CHEST  2 VIEW COMPARISON:  09/27/2015 FINDINGS: The lungs are clear. The pulmonary vasculature is normal. Heart size is normal. Hilar and mediastinal contours are unremarkable. There is no pleural effusion. IMPRESSION: No active cardiopulmonary  disease. Electronically Signed   By: Andreas Newport M.D.   On: 11/05/2015 04:02   Dg Lumbar Spine Complete  11/05/2015  CLINICAL DATA:  [place] tonight while going to the bathroom. Left hip pain and low back pain. EXAM: LUMBAR SPINE - COMPLETE 4+ VIEW COMPARISON:  02/18/2014 FINDINGS: Postoperative change with L3 through L5 laminectomies and with posterior rod and screw fixation from L4 to the sacrum. Surgical hardware appears intact without change in position. Alignment of the lumbar vertebrae is unchanged. Degenerative changes in the lumbar spine with narrowed interspaces and endplate hypertrophic changes. No vertebral compression deformities. Degenerative changes demonstrate progression since the previous study. Visualize sacrum appears intact. Incidental note of displacement of the liner for the acetabular cup of the left hip arthroplasty. IMPRESSION: Postoperative and degenerative changes in the lumbar spine. No acute displaced fractures identified. Electronically Signed   By: Lucienne Capers M.D.   On: 11/05/2015 04:06   Dg Hip Unilat  With Pelvis 2-3 Views Left  11/05/2015  CLINICAL DATA:  [place] tonight while going to the bathroom EXAM: DG HIP (WITH OR WITHOUT PELVIS) 2-3V LEFT COMPARISON:  10/27/2015 FINDINGS: Negative for acute fracture dislocation. There is rotation of a portion of the acetabular component, further away compared to its position of 10/27/2015. Pubic symphysis and sacroiliac joints are intact. IMPRESSION: Further rotational displacement of the liner compared to 10/27/2015. No fracture or dislocation. Electronically Signed   By: Andreas Newport M.D.   On: 11/05/2015 04:05   I have personally reviewed and evaluated these images and lab results as part of my medical decision-making.   EKG Interpretation   Date/Time:  Thursday Nov 05 2015 00:17:38 EDT Ventricular Rate:  74 PR Interval:  148 QRS Duration: 98 QT Interval:  386 QTC Calculation: 428 R Axis:   53 Text  Interpretation:  Normal sinus rhythm Normal ECG Confirmed by Alif Petrak   MD, Emmely Bittinger ([id number]) on 11/05/2015 3:24:08 AM      MDM   Final diagnoses:  None  URI Contusion  Patient presents to the ER for evaluation of fall. Patient reports that he was going to the bathroom tonight when he fell. He is complaining of pain in his lower back. He also has pain in the left hip. Patient reports that he hasn't had a previous hip replacement and has had disruption of the hardware, is scheduled for surgery on his left hip in 2 weeks. The pain in his hip likely caused the fall, however, he also started coughing this evening right before the fall. He reports that he has had a cough for 2 days that has been productive of clear and white sputum. There is no shortness of breath currently patient did not pass out. There was no head injury. Patient denies neck and upper back pain.  Patient's chest does not show pneumonia. X-ray of lumbar spine did not show any evidence of injury. Hip x-ray does show further rotational displacement of the liner of the hip replacement, but does not require any immediate surgical intervention.  I personally performed the services described in this documentation, which was scribed in my presence. The recorded information has been reviewed and is accurate.    Orpah Greek, MD 11/05/15 [phone]

## 2015-11-05 NOTE — ED Notes (Signed)
The pt was going to the br and started coughing and he fell  .  He now has rt shoulder and back  He has been coughing and he coughed up some white stuff.  He is a smoker  Back surgery in 2012

## 2015-11-05 NOTE — ED Notes (Signed)
Patient left at this time with all belongings. 

## 2015-11-10 ENCOUNTER — Encounter (HOSPITAL_COMMUNITY): Payer: Self-pay

## 2015-11-10 ENCOUNTER — Encounter (HOSPITAL_COMMUNITY)
Admission: RE | Admit: 2015-11-10 | Discharge: 2015-11-10 | Disposition: A | Discharge status: Home / Self Care | Payer: Medicare Other | Source: Ambulatory Visit | Attending: Orthopaedic Surgery | Admitting: Orthopaedic Surgery

## 2015-11-10 DIAGNOSIS — T84091A Other mechanical complication of internal left hip prosthesis, initial encounter: Secondary | ICD-10-CM | POA: Diagnosis not present

## 2015-11-10 DIAGNOSIS — Z01812 Encounter for preprocedural laboratory examination: Secondary | ICD-10-CM | POA: Insufficient documentation

## 2015-11-10 HISTORY — DX: Pneumonia, unspecified organism: J18.9

## 2015-11-10 LAB — URINE MICROSCOPIC-ADD ON: SQUAMOUS EPITHELIAL / LPF: NONE SEEN

## 2015-11-10 LAB — CBC
HEMATOCRIT: 44.1 % (ref 39.0–52.0)
HEMOGLOBIN: 14.8 g/dL (ref 13.0–17.0)
MCH: 32.3 pg (ref 26.0–34.0)
MCHC: 33.6 g/dL (ref 30.0–36.0)
MCV: 96.3 fL (ref 78.0–100.0)
Platelets: 280 10*3/uL (ref 150–400)
RBC: 4.58 MIL/uL (ref 4.22–5.81)
RDW: 12.3 % (ref 11.5–15.5)
WBC: 7.4 10*3/uL (ref 4.0–10.5)

## 2015-11-10 LAB — COMPREHENSIVE METABOLIC PANEL
ALBUMIN: 4 g/dL (ref 3.5–5.0)
ALK PHOS: 88 U/L (ref 38–126)
ALT: 24 U/L (ref 17–63)
ANION GAP: 12 (ref 5–15)
AST: 28 U/L (ref 15–41)
BUN: 9 mg/dL (ref 6–20)
CHLORIDE: 106 mmol/L (ref 101–111)
CO2: 24 mmol/L (ref 22–32)
Calcium: 10.2 mg/dL (ref 8.9–10.3)
Creatinine, Ser: 0.9 mg/dL (ref 0.61–1.24)
GFR calc non Af Amer: >60 mL/min (ref >60)
GLUCOSE: 105 mg/dL — AB (ref 65–99)
Potassium: 3.7 mmol/L (ref 3.5–5.1)
SODIUM: 142 mmol/L (ref 135–145)
Total Bilirubin: 1.1 mg/dL (ref 0.3–1.2)
Total Protein: 7.7 g/dL (ref 6.5–8.1)

## 2015-11-10 LAB — URINALYSIS, ROUTINE W REFLEX MICROSCOPIC
Bilirubin Urine: NEGATIVE
Glucose, UA: NEGATIVE mg/dL
Ketones, ur: NEGATIVE mg/dL
LEUKOCYTES UA: NEGATIVE
NITRITE: NEGATIVE
PROTEIN: 30 mg/dL — AB
pH: 5.5 (ref 5.0–8.0)

## 2015-11-10 LAB — PROTIME-INR
INR: 1.07 (ref 0.00–1.49)
Prothrombin Time: 14.1 seconds (ref 11.6–15.2)

## 2015-11-10 LAB — APTT: APTT: 31 s (ref 24–37)

## 2015-11-10 LAB — SURGICAL PCR SCREEN
MRSA, PCR: NEGATIVE
STAPHYLOCOCCUS AUREUS: POSITIVE — AB

## 2015-11-10 NOTE — Pre-Procedure Instructions (Signed)
    Devrick Forestier  11/10/2015      [facility] [address], [place] - [address] [place] [address] Phone: [phone] Fax: [phone]    Your procedure is scheduled on Wed. May 17  Report to [facility] Admitting at 10:30 A.M.  Call this number if you have problems the morning of surgery:  [phone]             Any questions prior to surgery call [phone] Monday- Friday 8am-4pm   Remember:  Do not eat food or drink liquids after midnigh on Tues. May 16   Take these medicines the morning of surgery with A SIP OF WATER : pain pill   Do not wear jewelry.  Do not wear lotions, powders, or perfumes.  You may not wear deodorant.  Do not shave 48 hours prior to surgery.  Men may shave face and neck.  Do not bring valuables to the hospital.  [facility] is not responsible for any belongings or valuables.  Contacts, dentures or bridgework may not be worn into surgery.  Leave your suitcase in the car.  After surgery it may be brought to your room.  For patients admitted to the hospital, discharge time will be determined by your treatment team.  Patients discharged the day of surgery will not be allowed to drive home.   Name and phone number of your driver:    Special instructions:  Review preparing for surgery handout  Please read over the following fact sheets that you were given. Coughing and Deep Breathing and MRSA Information

## 2015-11-10 NOTE — Progress Notes (Addendum)
PCP:Dr.edwin [facility] [place] ointment called to [employer] @[address]

## 2015-11-17 NOTE — H&P (Signed)
TOTAL HIP REVISION ADMISSION H&P  Patient is admitted for left revision total hip arthroplasty.  Subjective:  Chief Complaint: left hip pain  HPI: Peter Manning, 60 y.o. male, has a history of pain and functional disability in the left hip due to trauma and patient has failed non-surgical conservative treatments for greater than 12 weeks to include NSAID's and/or analgesics, use of assistive devices and activity modification. The indications for the revision total hip arthroplasty are bearing surface wear leading to  hip instability.  Onset of symptoms was gradual starting 5 years ago with gradually worsening course since that time.  Prior procedures on the left hip include arthroplasty.  Patient currently rates pain in the left hip at 10 out of 10 with activity.  There is night pain, worsening of pain with activity and weight bearing, trendelenberg gait, pain that interfers with activities of daily living and pain with passive range of motion. Patient has evidence of broken polyethylene locking ring by imaging studies.  This condition presents safety issues increasing the risk of falls.  This patient has had previous total hip replacement.  There is no current active infection.  Patient Active Problem List   Diagnosis Date Noted  . OSA (obstructive sleep apnea) 12/09/2011  . Chronic bronchitis ([place]) 12/09/2011  . Tobacco abuse 12/09/2011   Past Medical History  Diagnosis Date  . Hypertension   . Hyperlipidemia   . OSA (obstructive sleep apnea) 12/09/2011    PSG 01/03/12>>AHI 45.7, SpO2 low 73%, PLMI 0.  CPAP 15 cm H2O>>AHI 0, +R.   Marland Kitchen Bronchitis   . Arthritis   . Pneumonia 2015    Past Surgical History  Procedure Laterality Date  . Joint replacement    . Back surgery    . Total hip arthroplasty      No prescriptions prior to admission   No Known Allergies  Social History  Substance Use Topics  . Smoking status: Current Some Day Smoker -- 0.50 packs/day for 39 years    Types:  Cigarettes  . Smokeless tobacco: Not on file     Comment: states on and off smoker  . Alcohol Use: No    Family History  Problem Relation Age of Onset  . Heart attack Father   . Cancer Mother       Review of Systems  Constitutional: Negative.   HENT: Negative.   Eyes: Negative.   Respiratory: Negative.   Cardiovascular: Negative.   Genitourinary: Negative.   Musculoskeletal: Positive for joint pain.  Skin: Negative.   Neurological: Negative.   Psychiatric/Behavioral: Negative.     Objective:  Physical Exam  Constitutional: He is oriented to person, place, and time. No distress.  HENT:  Head: Atraumatic.  Eyes: EOM are normal.  Neck: Normal range of motion.  Cardiovascular: Normal rate.   Respiratory: No respiratory distress.  GI: There is no tenderness.  Musculoskeletal: He exhibits no tenderness.  Neurological: He is alert and oriented to person, place, and time.  Skin: Skin is warm and dry.  Psychiatric: He has a normal mood and affect.    Vital signs in last 24 hours:     Labs:   Estimated body mass index is 36.96 kg/(m^2) as calculated from the following:   Height as of 10/27/15: 5\' 8"  (1.727 m).   Weight as of 10/27/15: 110.224 kg (243 lb).  Imaging Review:  Plain radiographs demonstrate broken polyethylene locking ring. .The bone quality appears to be good for age and reported activity level.  Assessment/Plan:  End stage arthritis, left hip(s) with failed previous arthroplasty. The plan would be a liner exchange, ball exchange, from his 2001 surgery.  This is an Clinical cytogeneticist prosthesis.  Normally would be in the hospital 1-2 days after surgery and would have to revisit and renew and refocus on his hip precautions.  Risks of surgery discussed, potential for hip infection, dislocation, fracture, discussed.  All questions answered.  He understands and requests we proceed.   . Revision total hip arthroplasty is deemed medically necessary. The treatment  options including medical management, injection therapy, arthroscopy and arthroplasty were discussed at length. The risks and benefits of total hip arthroplasty were presented and reviewed. The risks due to aseptic loosening, infection, stiffness, dislocation/subluxation,  thromboembolic complications and other imponderables were discussed.  The patient acknowledged the explanation, agreed to proceed with the plan and consent was signed. Patient is being admitted for inpatient treatment for surgery, pain control, PT, OT, prophylactic antibiotics, VTE prophylaxis, progressive ambulation and ADL's and discharge planning. The patient is planning to be discharged home with home health services

## 2015-11-18 ENCOUNTER — Inpatient Hospital Stay (HOSPITAL_COMMUNITY): Payer: Medicare Other | Admitting: Certified Registered Nurse Anesthetist

## 2015-11-18 ENCOUNTER — Inpatient Hospital Stay (HOSPITAL_COMMUNITY): Payer: Medicare Other

## 2015-11-18 ENCOUNTER — Encounter (HOSPITAL_COMMUNITY)
Admission: RE | Disposition: A | Discharge status: Skilled Nursing Facility | Payer: Self-pay | Source: Ambulatory Visit | Attending: Orthopaedic Surgery

## 2015-11-18 ENCOUNTER — Encounter (HOSPITAL_COMMUNITY): Payer: Self-pay | Admitting: *Deleted

## 2015-11-18 ENCOUNTER — Inpatient Hospital Stay (HOSPITAL_COMMUNITY)
Admission: RE | Admit: 2015-11-18 | Discharge: 2015-11-21 | DRG: 468 | Disposition: A | Discharge status: Skilled Nursing Facility | Payer: Medicare Other | Source: Ambulatory Visit | Attending: Orthopaedic Surgery | Admitting: Orthopaedic Surgery

## 2015-11-18 DIAGNOSIS — F17210 Nicotine dependence, cigarettes, uncomplicated: Secondary | ICD-10-CM | POA: Diagnosis present

## 2015-11-18 DIAGNOSIS — G8929 Other chronic pain: Secondary | ICD-10-CM | POA: Diagnosis not present

## 2015-11-18 DIAGNOSIS — T84061A Wear of articular bearing surface of internal prosthetic left hip joint, initial encounter: Principal | ICD-10-CM | POA: Diagnosis present

## 2015-11-18 DIAGNOSIS — Z09 Encounter for follow-up examination after completed treatment for conditions other than malignant neoplasm: Secondary | ICD-10-CM

## 2015-11-18 DIAGNOSIS — Z23 Encounter for immunization: Secondary | ICD-10-CM

## 2015-11-18 DIAGNOSIS — Z7982 Long term (current) use of aspirin: Secondary | ICD-10-CM | POA: Diagnosis not present

## 2015-11-18 DIAGNOSIS — T84031A Mechanical loosening of internal left hip prosthetic joint, initial encounter: Secondary | ICD-10-CM | POA: Diagnosis present

## 2015-11-18 DIAGNOSIS — I10 Essential (primary) hypertension: Secondary | ICD-10-CM | POA: Diagnosis present

## 2015-11-18 DIAGNOSIS — G4733 Obstructive sleep apnea (adult) (pediatric): Secondary | ICD-10-CM | POA: Diagnosis present

## 2015-11-18 DIAGNOSIS — M1612 Unilateral primary osteoarthritis, left hip: Secondary | ICD-10-CM | POA: Diagnosis present

## 2015-11-18 DIAGNOSIS — Z981 Arthrodesis status: Secondary | ICD-10-CM | POA: Diagnosis not present

## 2015-11-18 DIAGNOSIS — E785 Hyperlipidemia, unspecified: Secondary | ICD-10-CM | POA: Diagnosis present

## 2015-11-18 DIAGNOSIS — Y838 Other surgical procedures as the cause of abnormal reaction of the patient, or of later complication, without mention of misadventure at the time of the procedure: Secondary | ICD-10-CM | POA: Diagnosis present

## 2015-11-18 DIAGNOSIS — Z96649 Presence of unspecified artificial hip joint: Secondary | ICD-10-CM

## 2015-11-18 HISTORY — DX: Sleep apnea, unspecified: G47.30

## 2015-11-18 HISTORY — PX: TOTAL HIP REVISION: SHX763

## 2015-11-18 SURGERY — TOTAL HIP REVISION
Anesthesia: General | Site: Hip | Laterality: Left

## 2015-11-18 MED ORDER — MIDAZOLAM HCL 2 MG/2ML IJ SOLN
INTRAMUSCULAR | Status: AC
  Filled 2015-11-18: qty 2

## 2015-11-18 MED ORDER — ONDANSETRON HCL 4 MG/2ML IJ SOLN
INTRAMUSCULAR | Status: DC | PRN
  Administered 2015-11-18: 4 mg via INTRAVENOUS

## 2015-11-18 MED ORDER — LACTATED RINGERS IV SOLN
INTRAVENOUS | Status: DC
  Administered 2015-11-18 (×2): via INTRAVENOUS

## 2015-11-18 MED ORDER — SUCCINYLCHOLINE CHLORIDE 20 MG/ML IJ SOLN
INTRAMUSCULAR | Status: DC | PRN
  Administered 2015-11-18: 140 mg via INTRAVENOUS

## 2015-11-18 MED ORDER — HYDROMORPHONE HCL 1 MG/ML IJ SOLN
1.0000 mg | INTRAMUSCULAR | Status: DC | PRN
Start: 2015-11-18 — End: 2015-11-21
  Administered 2015-11-18: 1 mg via INTRAVENOUS
  Filled 2015-11-18: qty 1

## 2015-11-18 MED ORDER — CHLORHEXIDINE GLUCONATE 4 % EX LIQD: 60.0000 mL | Freq: Once | CUTANEOUS | Status: DC

## 2015-11-18 MED ORDER — GLYCOPYRROLATE 0.2 MG/ML IV SOSY
PREFILLED_SYRINGE | INTRAVENOUS | Status: AC
  Filled 2015-11-18: qty 3

## 2015-11-18 MED ORDER — EPHEDRINE SULFATE 50 MG/ML IJ SOLN
INTRAMUSCULAR | Status: DC | PRN
  Administered 2015-11-18: 10 mg via INTRAVENOUS

## 2015-11-18 MED ORDER — SODIUM CHLORIDE 0.45 % IV SOLN
INTRAVENOUS | Status: DC
  Administered 2015-11-18: 19:00:00 via INTRAVENOUS

## 2015-11-18 MED ORDER — MENTHOL 3 MG MT LOZG: 1.0000 | LOZENGE | OROMUCOSAL | Status: DC | PRN

## 2015-11-18 MED ORDER — POLYETHYLENE GLYCOL 3350 17 G PO PACK: 17.0000 g | PACK | Freq: Every day | ORAL | Status: DC | PRN

## 2015-11-18 MED ORDER — PHENOL 1.4 % MT LIQD: 1.0000 | OROMUCOSAL | Status: DC | PRN

## 2015-11-18 MED ORDER — CEFAZOLIN SODIUM 1-5 GM-% IV SOLN
1.0000 g | Freq: Four times a day (QID) | INTRAVENOUS | Status: AC
  Administered 2015-11-18 – 2015-11-19 (×2): 1 g via INTRAVENOUS
  Filled 2015-11-18 (×2): qty 50

## 2015-11-18 MED ORDER — ACETAMINOPHEN 650 MG RE SUPP: 650.0000 mg | Freq: Four times a day (QID) | RECTAL | Status: DC | PRN

## 2015-11-18 MED ORDER — MEPERIDINE HCL 25 MG/ML IJ SOLN: 6.2500 mg | INTRAMUSCULAR | Status: DC | PRN

## 2015-11-18 MED ORDER — METOCLOPRAMIDE HCL 5 MG/ML IJ SOLN: 5.0000 mg | Freq: Three times a day (TID) | INTRAMUSCULAR | Status: DC | PRN

## 2015-11-18 MED ORDER — FENTANYL CITRATE (PF) 250 MCG/5ML IJ SOLN
INTRAMUSCULAR | Status: AC
  Filled 2015-11-18: qty 5

## 2015-11-18 MED ORDER — LISINOPRIL 20 MG PO TABS
20.0000 mg | ORAL_TABLET | Freq: Every day | ORAL | Status: DC
  Administered 2015-11-19 – 2015-11-21 (×3): 20 mg via ORAL
  Filled 2015-11-18 (×3): qty 1

## 2015-11-18 MED ORDER — CEFAZOLIN SODIUM-DEXTROSE 2-4 GM/100ML-% IV SOLN
2.0000 g | INTRAVENOUS | Status: AC
  Administered 2015-11-18: 2 g via INTRAVENOUS

## 2015-11-18 MED ORDER — BUPIVACAINE HCL (PF) 0.25 % IJ SOLN
INTRAMUSCULAR | Status: AC
  Filled 2015-11-18: qty 30

## 2015-11-18 MED ORDER — 0.9 % SODIUM CHLORIDE (POUR BTL) OPTIME
TOPICAL | Status: DC | PRN
  Administered 2015-11-18: 1000 mL

## 2015-11-18 MED ORDER — NEOSTIGMINE METHYLSULFATE 10 MG/10ML IV SOLN
INTRAVENOUS | Status: DC | PRN
  Administered 2015-11-18: 2.5 mg via INTRAVENOUS

## 2015-11-18 MED ORDER — NEOSTIGMINE METHYLSULFATE 5 MG/5ML IV SOSY
PREFILLED_SYRINGE | INTRAVENOUS | Status: AC
  Filled 2015-11-18: qty 5

## 2015-11-18 MED ORDER — TEMAZEPAM 15 MG PO CAPS: 15.0000 mg | ORAL_CAPSULE | Freq: Every evening | ORAL | Status: DC | PRN

## 2015-11-18 MED ORDER — PROPOFOL 10 MG/ML IV BOLUS
INTRAVENOUS | Status: AC
  Filled 2015-11-18: qty 20

## 2015-11-18 MED ORDER — ACETAMINOPHEN 325 MG PO TABS
650.0000 mg | ORAL_TABLET | Freq: Four times a day (QID) | ORAL | Status: DC | PRN
  Administered 2015-11-19 – 2015-11-20 (×3): 650 mg via ORAL
  Filled 2015-11-18 (×3): qty 2

## 2015-11-18 MED ORDER — ALBUTEROL SULFATE HFA 108 (90 BASE) MCG/ACT IN AERS
INHALATION_SPRAY | RESPIRATORY_TRACT | Status: AC
  Filled 2015-11-18: qty 6.7

## 2015-11-18 MED ORDER — PROCHLORPERAZINE EDISYLATE 5 MG/ML IJ SOLN: 10.0000 mg | INTRAMUSCULAR | Status: DC | PRN

## 2015-11-18 MED ORDER — LIDOCAINE 2% (20 MG/ML) 5 ML SYRINGE
INTRAMUSCULAR | Status: AC
  Filled 2015-11-18: qty 5

## 2015-11-18 MED ORDER — ALBUTEROL SULFATE HFA 108 (90 BASE) MCG/ACT IN AERS
INHALATION_SPRAY | RESPIRATORY_TRACT | Status: DC | PRN
  Administered 2015-11-18: 4 via RESPIRATORY_TRACT

## 2015-11-18 MED ORDER — CEFAZOLIN SODIUM-DEXTROSE 2-4 GM/100ML-% IV SOLN
INTRAVENOUS | Status: AC
  Filled 2015-11-18: qty 100

## 2015-11-18 MED ORDER — DEXTROSE 5 % IV SOLN
500.0000 mg | Freq: Four times a day (QID) | INTRAVENOUS | Status: DC | PRN
  Filled 2015-11-18: qty 5

## 2015-11-18 MED ORDER — MIDAZOLAM HCL 5 MG/5ML IJ SOLN
INTRAMUSCULAR | Status: DC | PRN
  Administered 2015-11-18: 2 mg via INTRAVENOUS

## 2015-11-18 MED ORDER — METHOCARBAMOL 500 MG PO TABS
ORAL_TABLET | ORAL | Status: AC
  Administered 2015-11-18: 500 mg
  Filled 2015-11-18: qty 1

## 2015-11-18 MED ORDER — DOCUSATE SODIUM 100 MG PO CAPS
100.0000 mg | ORAL_CAPSULE | Freq: Two times a day (BID) | ORAL | Status: DC
  Administered 2015-11-18 – 2015-11-21 (×6): 100 mg via ORAL
  Filled 2015-11-18 (×6): qty 1

## 2015-11-18 MED ORDER — ONDANSETRON HCL 4 MG/2ML IJ SOLN
INTRAMUSCULAR | Status: AC
  Filled 2015-11-18: qty 2

## 2015-11-18 MED ORDER — OXYCODONE HCL 5 MG PO TABS
10.0000 mg | ORAL_TABLET | ORAL | Status: DC | PRN
  Administered 2015-11-18 – 2015-11-21 (×8): 10 mg via ORAL
  Filled 2015-11-18 (×7): qty 2

## 2015-11-18 MED ORDER — PROPOFOL 10 MG/ML IV BOLUS
INTRAVENOUS | Status: DC | PRN
  Administered 2015-11-18: 200 mg via INTRAVENOUS

## 2015-11-18 MED ORDER — METHOCARBAMOL 750 MG PO TABS
750.0000 mg | ORAL_TABLET | Freq: Four times a day (QID) | ORAL | Status: DC | PRN
  Administered 2015-11-21: 750 mg via ORAL
  Filled 2015-11-18: qty 1

## 2015-11-18 MED ORDER — HYDROMORPHONE HCL 1 MG/ML IJ SOLN
INTRAMUSCULAR | Status: AC
  Filled 2015-11-18: qty 1

## 2015-11-18 MED ORDER — LACTATED RINGERS IV SOLN: INTRAVENOUS | Status: DC

## 2015-11-18 MED ORDER — HYDROMORPHONE HCL 1 MG/ML IJ SOLN
0.2500 mg | INTRAMUSCULAR | Status: DC | PRN
  Administered 2015-11-18 (×3): 0.5 mg via INTRAVENOUS

## 2015-11-18 MED ORDER — LIDOCAINE HCL (CARDIAC) 20 MG/ML IV SOLN
INTRAVENOUS | Status: DC | PRN
  Administered 2015-11-18: 50 mg via INTRAVENOUS

## 2015-11-18 MED ORDER — ONDANSETRON HCL 4 MG/2ML IJ SOLN: 4.0000 mg | Freq: Four times a day (QID) | INTRAMUSCULAR | Status: DC | PRN

## 2015-11-18 MED ORDER — ONDANSETRON HCL 4 MG PO TABS: 4.0000 mg | ORAL_TABLET | Freq: Four times a day (QID) | ORAL | Status: DC | PRN

## 2015-11-18 MED ORDER — BUPIVACAINE HCL (PF) 0.25 % IJ SOLN
INTRAMUSCULAR | Status: DC | PRN
  Administered 2015-11-18: 30 mL

## 2015-11-18 MED ORDER — FENTANYL CITRATE (PF) 100 MCG/2ML IJ SOLN
INTRAMUSCULAR | Status: DC | PRN
  Administered 2015-11-18 (×2): 50 ug via INTRAVENOUS
  Administered 2015-11-18: 100 ug via INTRAVENOUS
  Administered 2015-11-18: 50 ug via INTRAVENOUS

## 2015-11-18 MED ORDER — METOCLOPRAMIDE HCL 5 MG PO TABS: 5.0000 mg | ORAL_TABLET | Freq: Three times a day (TID) | ORAL | Status: DC | PRN

## 2015-11-18 MED ORDER — ROCURONIUM BROMIDE 100 MG/10ML IV SOLN
INTRAVENOUS | Status: DC | PRN
  Administered 2015-11-18: 35 mg via INTRAVENOUS

## 2015-11-18 MED ORDER — GLYCOPYRROLATE 0.2 MG/ML IJ SOLN
INTRAMUSCULAR | Status: DC | PRN
  Administered 2015-11-18: 0.4 mg via INTRAVENOUS

## 2015-11-18 MED ORDER — PHENYLEPHRINE HCL 10 MG/ML IJ SOLN
INTRAMUSCULAR | Status: DC | PRN
  Administered 2015-11-18 (×4): 80 ug via INTRAVENOUS

## 2015-11-18 MED ORDER — ASPIRIN EC 325 MG PO TBEC
325.0000 mg | DELAYED_RELEASE_TABLET | Freq: Every day | ORAL | Status: DC
  Administered 2015-11-19 – 2015-11-21 (×3): 325 mg via ORAL
  Filled 2015-11-18 (×3): qty 1

## 2015-11-18 MED ORDER — ALBUTEROL SULFATE HFA 108 (90 BASE) MCG/ACT IN AERS: 1.0000 | INHALATION_SPRAY | Freq: Four times a day (QID) | RESPIRATORY_TRACT | Status: DC | PRN

## 2015-11-18 SURGICAL SUPPLY — 73 items
APL SKNCLS STERI-STRIP NONHPOA (GAUZE/BANDAGES/DRESSINGS) ×1
BENZOIN TINCTURE PRP APPL 2/3 (GAUZE/BANDAGES/DRESSINGS) ×3 IMPLANT
BLADE SURG 10 STRL SS (BLADE) ×6 IMPLANT
BLADE SURG ROTATE 9660 (MISCELLANEOUS) IMPLANT
BRUSH FEMORAL CANAL (MISCELLANEOUS) IMPLANT
COVER BACK TABLE 24X17X13 BIG (DRAPES) ×1 IMPLANT
COVER SURGICAL LIGHT HANDLE (MISCELLANEOUS) ×3 IMPLANT
DRAPE C-ARM 42X72 X-RAY (DRAPES) IMPLANT
DRAPE IMP U-DRAPE 54X76 (DRAPES) ×3 IMPLANT
DRAPE INCISE IOBAN 66X45 STRL (DRAPES) ×2 IMPLANT
DRAPE ORTHO SPLIT 77X108 STRL (DRAPES) ×6
DRAPE SURG ORHT 6 SPLT 77X108 (DRAPES) ×2 IMPLANT
DRAPE U-SHAPE 47X51 STRL (DRAPES) ×3 IMPLANT
DRSG MEPILEX BORDER 4X8 (GAUZE/BANDAGES/DRESSINGS) ×2 IMPLANT
DRSG PAD ABDOMINAL 8X10 ST (GAUZE/BANDAGES/DRESSINGS) ×6 IMPLANT
DURAPREP 26ML APPLICATOR (WOUND CARE) ×6 IMPLANT
ELECT BLADE 4.0 EZ CLEAN MEGAD (MISCELLANEOUS) ×3
ELECT CAUTERY BLADE 6.4 (BLADE) ×1 IMPLANT
ELECT REM PT RETURN 9FT ADLT (ELECTROSURGICAL) ×3
ELECTRODE BLDE 4.0 EZ CLN MEGD (MISCELLANEOUS) IMPLANT
ELECTRODE REM PT RTRN 9FT ADLT (ELECTROSURGICAL) ×1 IMPLANT
EVACUATOR 1/8 PVC DRAIN (DRAIN) IMPLANT
FACESHIELD WRAPAROUND (MASK) ×6 IMPLANT
FACESHIELD WRAPAROUND OR TEAM (MASK) ×2 IMPLANT
GAUZE SPONGE 4X4 12PLY STRL (GAUZE/BANDAGES/DRESSINGS) ×3 IMPLANT
GAUZE XEROFORM 5X9 LF (GAUZE/BANDAGES/DRESSINGS) ×3 IMPLANT
GLOVE BIOGEL PI IND STRL 8 (GLOVE) ×2 IMPLANT
GLOVE BIOGEL PI INDICATOR 8 (GLOVE) ×4
GLOVE ORTHO TXT STRL SZ7.5 (GLOVE) ×6 IMPLANT
GOWN STRL REUS W/ TWL LRG LVL3 (GOWN DISPOSABLE) ×1 IMPLANT
GOWN STRL REUS W/ TWL XL LVL3 (GOWN DISPOSABLE) ×1 IMPLANT
GOWN STRL REUS W/TWL 2XL LVL3 (GOWN DISPOSABLE) ×3 IMPLANT
GOWN STRL REUS W/TWL LRG LVL3 (GOWN DISPOSABLE) ×3
GOWN STRL REUS W/TWL XL LVL3 (GOWN DISPOSABLE) ×3
HANDPIECE INTERPULSE COAX TIP (DISPOSABLE)
HEAD OSTEO CTAPER L FIT (Orthopedic Implant) ×3 IMPLANT
HEAD OSTEO CTAPER L FIT 32 +5 (Orthopedic Implant) ×1 IMPLANT
IMMOBILIZER KNEE 20 (SOFTGOODS) IMPLANT
IMMOBILIZER KNEE 22 UNIV (SOFTGOODS) ×2 IMPLANT
IMMOBILIZER KNEE 24 THIGH 36 (MISCELLANEOUS) IMPLANT
IMMOBILIZER KNEE 24 UNIV (MISCELLANEOUS) ×3
INSERT ACTB 10D 50X32XSTRL LF (Orthopedic Implant) IMPLANT
INSERT OSTEO CFIRE ACET (Orthopedic Implant) ×3 IMPLANT
INSRT ACTB 10D 50X32XSTRL LF (Orthopedic Implant) ×1 IMPLANT
KIT BASIN OR (CUSTOM PROCEDURE TRAY) ×3 IMPLANT
KIT ROOM TURNOVER OR (KITS) ×3 IMPLANT
MANIFOLD NEPTUNE II (INSTRUMENTS) ×3 IMPLANT
NDL HYPO 25GX1X1/2 BEV (NEEDLE) ×1 IMPLANT
NEEDLE 1/2 CIR MAYO (NEEDLE) IMPLANT
NEEDLE HYPO 25GX1X1/2 BEV (NEEDLE) ×3 IMPLANT
NS IRRIG 1000ML POUR BTL (IV SOLUTION) ×3 IMPLANT
PACK TOTAL JOINT (CUSTOM PROCEDURE TRAY) ×3 IMPLANT
PACK UNIVERSAL I (CUSTOM PROCEDURE TRAY) ×3 IMPLANT
PAD ARMBOARD 7.5X6 YLW CONV (MISCELLANEOUS) ×6 IMPLANT
REAMER ROD DEEP FLUTE 2.5X950 (INSTRUMENTS) IMPLANT
SET HNDPC FAN SPRY TIP SCT (DISPOSABLE) IMPLANT
SPONGE LAP 4X18 X RAY DECT (DISPOSABLE) ×6 IMPLANT
STAPLER VISISTAT 35W (STAPLE) ×3 IMPLANT
SUCTION FRAZIER HANDLE 10FR (MISCELLANEOUS) ×2
SUCTION TUBE FRAZIER 10FR DISP (MISCELLANEOUS) ×1 IMPLANT
SUT ETHIBOND NAB CT1 #1 30IN (SUTURE) ×7 IMPLANT
SUT TICRON (SUTURE) ×2 IMPLANT
SUT VIC AB 0 CT1 27 (SUTURE) ×3
SUT VIC AB 0 CT1 27XBRD ANBCTR (SUTURE) ×1 IMPLANT
SUT VIC AB 2-0 CT1 27 (SUTURE) ×6
SUT VIC AB 2-0 CT1 TAPERPNT 27 (SUTURE) ×3 IMPLANT
SUT VICRYL 0 TIES 12 18 (SUTURE) ×1 IMPLANT
SYR CONTROL 10ML LL (SYRINGE) ×3 IMPLANT
TOWEL OR 17X24 6PK STRL BLUE (TOWEL DISPOSABLE) ×3 IMPLANT
TOWEL OR 17X26 10 PK STRL BLUE (TOWEL DISPOSABLE) ×3 IMPLANT
TOWER CARTRIDGE SMART MIX (DISPOSABLE) IMPLANT
TRAY FOLEY CATH 16FRSI W/METER (SET/KITS/TRAYS/PACK) IMPLANT
WATER STERILE IRR 1000ML POUR (IV SOLUTION) ×4 IMPLANT

## 2015-11-18 NOTE — Interval H&P Note (Signed)
History and Physical Interval Note:  11/18/2015 12:17 PM  Peter Manning  has presented today for surgery, with the diagnosis of Broken Poly Liner Ring Left Total Hip Arthroplasty  The various methods of treatment have been discussed with the patient and family. After consideration of risks, benefits and other options for treatment, the patient has consented to  Procedure(s): LEFT TOTAL HIP REVISION (Left) as a surgical intervention .  The patient's history has been reviewed, patient examined, no change in status, stable for surgery.  I have reviewed the patient's chart and labs.  Questions were answered to the patient's satisfaction.     Levy Wellman C

## 2015-11-18 NOTE — Transfer of Care (Signed)
Immediate Anesthesia Transfer of Care Note  Patient: Peter Manning  Procedure(s) Performed: Procedure(s): LEFT TOTAL HIP REVISION (Left)  Patient Location: PACU  Anesthesia Type:General  Level of Consciousness: awake, alert  and oriented  Airway & Oxygen Therapy: Patient Spontanous Breathing and Patient connected to face mask oxygen  Post-op Assessment: Report given to RN, Post -op Vital signs reviewed and stable and Patient moving all extremities X 4  Post vital signs: Reviewed and stable  Last Vitals:  Filed Vitals:   11/18/15 0900  BP: 152/84  Pulse: 67  Temp: 37.1 C  Resp: 20    Last Pain: There were no vitals filed for this visit.       Complications: No apparent anesthesia complications

## 2015-11-18 NOTE — Brief Op Note (Signed)
11/18/2015  3:28 PM  PATIENT:  Peter Manning  60 y.o. male  PRE-OPERATIVE DIAGNOSIS:  Broken Poly Liner Ring Left Total Hip Arthroplasty  POST-OPERATIVE DIAGNOSIS:  Broken Poly Liner Ring Left Total Hip Arthroplasty  PROCEDURE:  Procedure(s): LEFT TOTAL HIP REVISION (Left)  SURGEON:  Surgeon(s) and Role:    Marybelle Killings, MD - Primary  PHYSICIAN ASSISTANT: Linnea Todisco m. Nastacia Raybuck pa-c    ANESTHESIA:   general  EBL:  Total I/O In: 1100 [I.V.:1100] Out: 250 [Blood:250]  BLOOD ADMINISTERED:none  DRAINS: none   LOCAL MEDICATIONS USED:  MARCAINE     SPECIMEN:  No Specimen  DISPOSITION OF SPECIMEN:  N/A  COUNTS:  YES  TOURNIQUET:  * No tourniquets in log *  DICTATION: .Dragon Dictation  PLAN OF CARE: Admit to inpatient   PATIENT DISPOSITION:  PACU - hemodynamically stable.

## 2015-11-18 NOTE — Progress Notes (Signed)
Orthopedic Tech Progress Note Patient Details:  Peter Manning 05-11-1956 [id number] Patient already has knee immobilizer on. Patient ID: Peter Manning, male   DOB: 1956/04/03, 60 y.o.   MRN: [id number]   [name] 11/18/2015, 6:02 PM

## 2015-11-18 NOTE — Anesthesia Postprocedure Evaluation (Signed)
Anesthesia Post Note  Patient: Peter Manning  Procedure(s) Performed: Procedure(s) (LRB): LEFT TOTAL HIP REVISION (Left)  Patient location during evaluation: PACU Anesthesia Type: General Level of consciousness: awake and alert Pain management: pain level controlled Vital Signs Assessment: post-procedure vital signs reviewed and stable Respiratory status: spontaneous breathing, nonlabored ventilation, respiratory function stable and patient connected to nasal cannula oxygen Cardiovascular status: blood pressure returned to baseline and stable Postop Assessment: no signs of nausea or vomiting Anesthetic complications: no    Last Vitals:  Filed Vitals:   11/18/15 1608 11/18/15 1615  BP: 130/74   Pulse: 65 71  Temp:    Resp: 10 14    Last Pain:  Filed Vitals:   11/18/15 1616  PainSc: Asleep                 Effie Berkshire

## 2015-11-18 NOTE — Progress Notes (Signed)
Report given to ly rn as caregiver 

## 2015-11-18 NOTE — Anesthesia Preprocedure Evaluation (Addendum)
Anesthesia Evaluation  Patient identified by MRN, date of birth, ID band Patient awake    Reviewed: Allergy & Precautions, NPO status , Patient's Chart, lab work & pertinent test results  Airway Mallampati: III  TM Distance: >3 FB Neck ROM: Full    Dental  (+) Missing, Dental Advisory Given,    Pulmonary sleep apnea , Current Smoker,   Expiratory wheeze cleared after cough.   breath sounds clear to auscultation       Cardiovascular hypertension, Pt. on medications  Rhythm:Regular Rate:Normal     Neuro/Psych negative neurological ROS  negative psych ROS   GI/Hepatic negative GI ROS, Neg liver ROS,   Endo/Other  negative endocrine ROS  Renal/GU negative Renal ROS  negative genitourinary   Musculoskeletal  (+) Arthritis , Osteoarthritis,    Abdominal   Peds negative pediatric ROS (+)  Hematology negative hematology ROS (+)   Anesthesia Other Findings   Reproductive/Obstetrics negative OB ROS                            Lab Results  Component Value Date   WBC 7.4 11/10/2015   HGB 14.8 11/10/2015   HCT 44.1 11/10/2015   MCV 96.3 11/10/2015   PLT 280 11/10/2015   Lab Results  Component Value Date   CREATININE 0.90 11/10/2015   BUN 9 11/10/2015   NA 142 11/10/2015   K 3.7 11/10/2015   CL 106 11/10/2015   CO2 24 11/10/2015   Lab Results  Component Value Date   INR 1.07 11/10/2015   INR 0.99 06/03/2009   11/2015 EKG: normal sinus rhythm.  Anesthesia Physical Anesthesia Plan  ASA: III  Anesthesia Plan: General   Post-op Pain Management:    Induction: Intravenous  Airway Management Planned: Oral ETT  Additional Equipment:   Intra-op Plan:   Post-operative Plan: Extubation in OR  Informed Consent: I have reviewed the patients History and Physical, chart, labs and discussed the procedure including the risks, benefits and alternatives for the proposed anesthesia with the  patient or authorized representative who has indicated his/her understanding and acceptance.   Dental advisory given  Plan Discussed with: CRNA  Anesthesia Plan Comments: (Anesthetic plan discussed in detail. Associated risk discussed including but not limited to life threatening cardiovascular, pulmonary events and dental damage. The postoperative pain management and antiemetic plan discussed with patient. All questions answered in detail. Patient is in agreement.    Smoking cessation discussed. )       Anesthesia Quick Evaluation

## 2015-11-18 NOTE — Anesthesia Procedure Notes (Signed)
Procedure Name: Intubation Date/Time: 11/18/2015 12:49 PM Performed by: Bufford Spikes Pre-anesthesia Checklist: Patient identified, Timeout performed, Emergency Drugs available, Suction available and Patient being monitored Patient Re-evaluated:Patient Re-evaluated prior to inductionOxygen Delivery Method: Circle system utilized Preoxygenation: Pre-oxygenation with 100% oxygen Intubation Type: IV induction Ventilation: Mask ventilation without difficulty Laryngoscope Size: Miller and 2 Tube type: Oral Tube size: 7.5 mm Number of attempts: 1 Airway Equipment and Method: Stylet and Oral airway Placement Confirmation: ETT inserted through vocal cords under direct vision,  positive ETCO2 and breath sounds checked- equal and bilateral Secured at: 23 cm Tube secured with: Tape Dental Injury: Teeth and Oropharynx as per pre-operative assessment

## 2015-11-19 ENCOUNTER — Encounter (HOSPITAL_COMMUNITY): Payer: Self-pay | Admitting: General Practice

## 2015-11-19 LAB — BASIC METABOLIC PANEL
Anion gap: 12 (ref 5–15)
BUN: 6 mg/dL (ref 6–20)
CHLORIDE: 97 mmol/L — AB (ref 101–111)
CO2: 28 mmol/L (ref 22–32)
CREATININE: 0.98 mg/dL (ref 0.61–1.24)
Calcium: 8.8 mg/dL — ABNORMAL LOW (ref 8.9–10.3)
GFR calc Af Amer: >60 mL/min (ref >60)
GFR calc non Af Amer: >60 mL/min (ref >60)
Glucose, Bld: 144 mg/dL — ABNORMAL HIGH (ref 65–99)
Potassium: 3 mmol/L — ABNORMAL LOW (ref 3.5–5.1)
SODIUM: 137 mmol/L (ref 135–145)

## 2015-11-19 LAB — CBC
HEMATOCRIT: 34.3 % — AB (ref 39.0–52.0)
HEMOGLOBIN: 11.6 g/dL — AB (ref 13.0–17.0)
MCH: 32.6 pg (ref 26.0–34.0)
MCHC: 33.8 g/dL (ref 30.0–36.0)
MCV: 96.3 fL (ref 78.0–100.0)
Platelets: 234 10*3/uL (ref 150–400)
RBC: 3.56 MIL/uL — ABNORMAL LOW (ref 4.22–5.81)
RDW: 12.3 % (ref 11.5–15.5)
WBC: 10.7 10*3/uL — ABNORMAL HIGH (ref 4.0–10.5)

## 2015-11-19 MED ORDER — PNEUMOCOCCAL VAC POLYVALENT 25 MCG/0.5ML IJ INJ
0.5000 mL | INJECTION | INTRAMUSCULAR | Status: AC
  Administered 2015-11-20: 0.5 mL via INTRAMUSCULAR
  Filled 2015-11-19: qty 0.5

## 2015-11-19 MED ORDER — POTASSIUM CHLORIDE CRYS ER 20 MEQ PO TBCR
40.0000 meq | EXTENDED_RELEASE_TABLET | ORAL | Status: AC
  Administered 2015-11-19 (×2): 40 meq via ORAL
  Filled 2015-11-19 (×2): qty 2

## 2015-11-19 NOTE — Progress Notes (Signed)
Physical Therapy Treatment Patient Details Name: Peter Manning MRN: [id number] DOB: 30-Nov-1955 Today's Date: 11/19/2015    History of Present Illness pt presents post L THRevision.  pt with hx of OSA, Chronic Bronchitis, HTN, L THR, and Back Surgery.    PT Comments    Pt indicates increased fatigue this pm, but agreeable to mobility.  Continue to feel pt would benefit from SNF level of care and therapies at D/C.  Will continue to follow.    Follow Up Recommendations  SNF     Equipment Recommendations  Rolling walker with 5" wheels;3in1 (PT)    Recommendations for Other Services       Precautions / Restrictions Precautions Precautions: Fall;Posterior Hip Precaution Comments: Reviewed hip precautions. Required Braces or Orthoses: Knee Immobilizer - Left Knee Immobilizer - Left: On at all times Restrictions Weight Bearing Restrictions: Yes LLE Weight Bearing: Weight bearing as tolerated    Mobility  Bed Mobility Overal bed mobility: Needs Assistance Bed Mobility: Supine to Sit;Sit to Supine     Supine to sit: Mod assist;HOB elevated Sit to supine: Min assist   General bed mobility comments: pt only needed A with L LE for return to bed this pm, but continues to need increased A for coming to sitting.    Transfers Overall transfer level: Needs assistance Equipment used: Rolling walker (2 wheeled) Transfers: Sit to/from Stand Sit to Stand: Mod assist         General transfer comment: cues for UE use and positioning of Bil LEs.  pt needs A for power up to standing and for balance.    Ambulation/Gait Ambulation/Gait assistance: Min guard Ambulation Distance (Feet): 90 Feet Assistive device: Rolling walker (2 wheeled) Gait Pattern/deviations: Step-to pattern;Decreased step length - right;Decreased stance time - left;Decreased stride length     General Gait Details: pt able to increase ambulation distance, but does endorse fatigue and needed 2 standing rest breaks  towards the end of ambulation.     Stairs            Wheelchair Mobility    Modified Rankin (Stroke Patients Only)       Balance Overall balance assessment: Needs assistance Sitting-balance support: Single extremity supported;Feet supported Sitting balance-Leahy Scale: Fair     Standing balance support: Bilateral upper extremity supported Standing balance-Leahy Scale: Poor                      Cognition Arousal/Alertness: Awake/alert Behavior During Therapy: WFL for tasks assessed/performed Overall Cognitive Status: Within Functional Limits for tasks assessed                      Exercises      General Comments        Pertinent Vitals/Pain Pain Assessment: 0-10 Pain Score: 5  Pain Location: L Hip Pain Descriptors / Indicators: Aching Pain Intervention(s): Monitored during session;Premedicated before session;Repositioned    Home Living Family/patient expects to be discharged to:: Unsure Living Arrangements: Alone                  Prior Function            PT Goals (current goals can now be found in the care plan section) Acute Rehab PT Goals Patient Stated Goal: To go to rehab before being home PT Goal Formulation: With patient Time For Goal Achievement: 11/26/15 Potential to Achieve Goals: Good Progress towards PT goals: Progressing toward goals    Frequency  7X/week  PT Plan Current plan remains appropriate    Co-evaluation             End of Session Equipment Utilized During Treatment: Gait belt Activity Tolerance: Patient limited by fatigue Patient left: in bed;with call bell/phone within reach     Time: 1309-1330 PT Time Calculation (min) (ACUTE ONLY): 21 min  Charges:  $Gait Training: 8-22 mins                    G CodesCatarina Hartshorn, Fox 11/19/2015, 1:36 PM

## 2015-11-19 NOTE — Progress Notes (Signed)
Rept to Dr. Maylon Peppers potassium blood level this AM 3.0. Orders received for PO potassium x 2 doses. Will continue to monitor.

## 2015-11-19 NOTE — Progress Notes (Signed)
Pt temp this AM at 1025 was 100.0 oral. Pt was primarily sleeping and not performing IS. Pt states, "I haven't done it because I didn't sleep good last night and I am tired." Pt instructed the importance of performing IS after surgery to prevent pneumonia. Pt verb understanding and after performing IS and taking Tylenol at 1300 temp down to 99.3 oral. Will continue to monitor.

## 2015-11-19 NOTE — Op Note (Signed)
NAMEMarland Kitchen  GATSBY, BYMAN NO.:  [id number]  MEDICAL RECORD NO.:  [id number]  LOCATION:  5N01C                        FACILITY:  [facility]  PHYSICIAN:  Elder Davidian C. Lorin Mercy, M.D.    DATE OF BIRTH:  25-Jan-1956  DATE OF PROCEDURE:  11/18/2015 DATE OF DISCHARGE:                              OPERATIVE REPORT   PREOPERATIVE DIAGNOSIS:  Painful loose left total hip arthroplasty with broken liner ring and metal-on-metal wear.  POSTOPERATIVE DIAGNOSIS:  Painful loose left total hip arthroplasty with broken liner ring and metal-on-metal wear.  PROCEDURE:  Left total hip arthroplasty revision.  SURGEON:  Phoebe Marter C. Lorin Mercy, M.D.  ASSISTANT:  Alyson Locket. Ricard Dillon, PA-C, medically necessary and present for the entire procedure.  NEW IMPLANTS:  32 x 52 poly with 10-degree wall, 32 +5 mm ball.  BRIEF HISTORY:  This 60 year old male, who has been on chronic pain medication, previous lumbar fusion, and total hip arthroplasty in 2001, and after 16 years presents with at least 58-month history of severe hip pain.  X-rays demonstrated broken locking ring metal-on-metal contact superiorly with obvious broken poly.  He finally presented after a number of months and scheduled for revision.  DESCRIPTION OF PROCEDURE:  After standard prepping and draping, induction of general anesthesia, with the patient in lateral position, marked 7 frame, Ancef prophylaxis with positive PCR for MSSA, 2 g Ancef was given.  DuraPrep was used.  Standard total hip sheets, drapes, impervious stockinette, Coban, sterile skin marker in the old incision and Betadine and Steri-Drape x2 was applied.  Time-out procedure completed.  A posterior approach was made.  Charnley retractor was placed.  After gluteus was split, there was extensive amount of scar tissue below the gluteus medius.  There was copious amounts of scar tissue with metal-on-metal black changes, no purulence was noted.  Once the posterior hip capsule was opened,  it was obvious that the poly had broken, spun out and the head was riding against the metal liner cup. After about an hour and a half of extensive debridement of scar tissue medially, some anterior and some superior, finally the broken poly was removed.  In the broken liner ring, there was about 1/5 of the ring still attached to the poly and 2 pieces were put together and represented the entire ring.  It appeared that the locking mechanism of the cup had not been destroyed.  Initially, a new cup with a 32 ball was attempted to be inserted but was unsuccessful due to tightness of the anterior capsule due to the scar tissue.  I then continued debridement until finally the neck of the [employer] prosthesis could be placed superiorly above the cup.  Finally, the cup was inserted with the wall posterolateral.  With significant difficulty, ball was finally placed on.  The hip was able to be flexed to 90 degrees, internal rotated to 80 degrees trace Shuck.  Copious irrigation.  The top portion of the gluteus maximus tendon had been released.  This was repaired with #1 Ethibond suture figure-of-eight sutures.  Tensor fascia was closed with #1 Ethibond, 0 and #1 Ethibond in the gluteus maximus fascia, 0 Vicryl in subcutaneous tissue, 2-0 Vicryl in subcu tissue,  skin staple closure, postop dressing, and knee immobilizer.  The patient tolerated the procedure well.  He will be weightbearing as tolerated and will have to go over his hip precautions.  Elta Guadeloupe C. Lorin Mercy, M.D.     MCY/MEDQ  D:  11/18/2015  T:  11/19/2015  Job:  [id number]

## 2015-11-19 NOTE — Clinical Social Work Note (Signed)
Clinical Social Work Assessment  Patient Details  Name: Peter Manning MRN: [id number] Date of Birth: 1955-09-29  Date of referral:  11/19/15               Reason for consult:  Facility Placement                Permission sought to share information with:  Facility Sport and exercise psychologist, Family Supports Permission granted to share information::  Yes, Verbal Permission Granted  Name::     Ms. Nicki Reaper  Agency::  [facility]  Relationship::  Sister  Contact Information:  [phone]  Housing/Transportation Living arrangements for the past 2 months:  [place] of Information:  Other (Comment Required) (Sibling) Patient Interpreter Needed:  None Criminal Activity/Legal Involvement Pertinent to Current Situation/Hospitalization:  No - Comment as needed Significant Relationships:  Siblings Lives with:  Self Do you feel safe going back to the place where you live?  Yes Need for family participation in patient care:  Yes (Comment) (Patient's sister active in patient's care.)  Care giving concerns:  Patient's sister expressed no concerns at this time.   Social Worker assessment / plan:  LCSW received referral for possible SNF placement. LCSW spoke with patient's sister regarding discharge plan. Per patient's sister, patient and patient's family would prefer for patient to be discharged to [facility] has made appropriate referral to facility. LCSW to continue to follow and assist with discharge planning needs.  Employment status:  Retired Forensic scientist:  Medicare PT Recommendations:  [place] / Referral to community resources:  [place]  Patient/Family's Response to care:  Patient's sister understanding and agreeable to [employer] of care.  Patient/Family's Understanding of and Emotional Response to Diagnosis, Current Treatment, and Prognosis:  Patient's sister understanding and  agreeable to LCSW plan of care.  Emotional Assessment Appearance:  Appears stated age Attitude/Demeanor/Rapport:  Other (LCSW spoke with patient's sister.) Affect (typically observed):  Other (LCSW spoke with patient's sister.) Orientation:  Oriented to Self, Oriented to Place, Oriented to  Time, Oriented to Situation Alcohol / Substance use:  Not Applicable Psych involvement (Current and /or in the community):  No (Comment) (Not appropriate on this admission.)  Discharge Needs  Concerns to be addressed:  No discharge needs identified Readmission within the last 30 days:  No Current discharge risk:  None Barriers to Discharge:  No Barriers Identified   Caroline Sauger, LCSW 11/19/2015, 1:13 PM

## 2015-11-19 NOTE — Clinical Social Work Placement (Signed)
   CLINICAL SOCIAL WORK PLACEMENT  NOTE  Date:  11/19/2015  Patient Details  Name: Peter Manning MRN: [id number] Date of Birth: 10-17-1955  Clinical Social Work is seeking post-discharge placement for this patient at the [place] level of care (*CSW will initial, date and re-position this form in  chart as items are completed):  Yes   Patient/family provided with [facility] Work Department's list of facilities offering this level of care within the geographic area requested by the patient (or if unable, by the patient's family).  Yes   Patient/family informed of their freedom to choose among providers that offer the needed level of care, that participate in Medicare, Medicaid or managed care program needed by the patient, have an available bed and are willing to accept the patient.  Yes   Patient/family informed of [facility]'s ownership interest in [facility] and [facility], as well as of the fact that they are under no obligation to receive care at these facilities.  PASRR submitted to EDS on 11/19/15     PASRR number received on 11/19/15     Existing PASRR number confirmed on       FL2 transmitted to all facilities in geographic area requested by pt/family on 11/19/15     FL2 transmitted to all facilities within larger geographic area on       Patient informed that his/her managed care company has contracts with or will negotiate with certain facilities, including the following:            Patient/family informed of bed offers received.  Patient chooses bed at       Physician recommends and patient chooses bed at      Patient to be transferred to   on  .  Patient to be transferred to facility by       Patient family notified on   of transfer.  Name of family member notified:        PHYSICIAN Please sign FL2     Additional Comment:    _______________________________________________ Caroline Sauger, LCSW 11/19/2015, 1:11  PM

## 2015-11-19 NOTE — NC FL2 (Signed)
[place] LEVEL OF CARE SCREENING TOOL     IDENTIFICATION  Patient Name: Peter Manning Birthdate: 1956/05/03 Sex: male Admission Date (Current Location): 11/18/2015  [facility] and [place] Number:  Herbalist and Address:  [facility], [place] [address] [place], [place] [address]      Provider Number: [id number]  Attending Physician Name and Address:  Marybelle Killings, MD  Relative Name and Phone Number:       Current Level of Care: Hospital Recommended Level of Care: [place] Prior Approval Number:    Date Approved/Denied:   PASRR Number: [id number]  Discharge Plan: SNF    Current Diagnoses: Patient Active Problem List   Diagnosis Date Noted  . 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 OSA (obstructive sleep apnea) 12/09/2011  . Chronic bronchitis ([place]) 12/09/2011  . Tobacco abuse 12/09/2011    Orientation RESPIRATION BLADDER Height & Weight     Self, Time, Situation, Place  Normal Continent Weight: 244 lb (110.678 kg) Height:  5\' 8"  (172.7 cm)  BEHAVIORAL SYMPTOMS/MOOD NEUROLOGICAL BOWEL NUTRITION STATUS      Continent Diet (Please see discharge summary.)  AMBULATORY STATUS COMMUNICATION OF NEEDS Skin   Limited Assist Verbally Surgical wounds                       Personal Care Assistance Level of Assistance  Bathing, Feeding, Dressing Bathing Assistance: Limited assistance Feeding assistance: Independent Dressing Assistance: Limited assistance     Functional Limitations Info             SPECIAL CARE FACTORS FREQUENCY  PT (By licensed PT), OT (By licensed OT)     PT Frequency: 5 OT Frequency: 5            Contractures      Additional Factors Info  Code Status, Allergies Code Status Info: FULL Allergies Info: No known allergies           Current Medications (11/19/2015):  This is the current hospital active medication list Current Facility-Administered Medications   Medication Dose Route Frequency Provider Last Rate Last Dose  . 0.45 % sodium chloride infusion   Intravenous Continuous Lanae Crumbly, PA-C 90 mL/hr at 11/18/15 1856    . acetaminophen (TYLENOL) tablet 650 mg  650 mg Oral Q6H PRN Lanae Crumbly, PA-C   650 mg at 11/19/15 1031   Or  . acetaminophen (TYLENOL) suppository 650 mg  650 mg Rectal Q6H PRN Lanae Crumbly, PA-C      . aspirin EC tablet 325 mg  325 mg Oral Q breakfast Lanae Crumbly, PA-C   325 mg at 11/19/15 [id number]  . docusate sodium (COLACE) capsule 100 mg  100 mg Oral BID Lanae Crumbly, PA-C   100 mg at 11/19/15 1020  . HYDROmorphone (DILAUDID) injection 1 mg  1 mg Intravenous Q3H PRN Lanae Crumbly, PA-C   1 mg at 11/18/15 1710  . lactated ringers infusion   Intravenous Continuous Effie Berkshire, MD 10 mL/hr at 11/18/15 0911    . lisinopril (PRINIVIL,ZESTRIL) tablet 20 mg  20 mg Oral Daily Lanae Crumbly, PA-C   20 mg at 11/19/15 1000  . menthol-cetylpyridinium (CEPACOL) lozenge 3 mg  1 lozenge Oral PRN Lanae Crumbly, PA-C       Or  . phenol (CHLORASEPTIC) mouth spray 1 spray  1 spray Mouth/Throat PRN Lanae Crumbly, PA-C      .  methocarbamol (ROBAXIN) tablet 750 mg  750 mg Oral Q6H PRN Lanae Crumbly, PA-C       Or  . methocarbamol (ROBAXIN) 500 mg in dextrose 5 % 50 mL IVPB  500 mg Intravenous Q6H PRN Lanae Crumbly, PA-C      . metoCLOPramide (REGLAN) tablet 5-10 mg  5-10 mg Oral Q8H PRN Lanae Crumbly, PA-C       Or  . metoCLOPramide (REGLAN) injection 5-10 mg  5-10 mg Intravenous Q8H PRN Lanae Crumbly, PA-C      . ondansetron [facility]) tablet 4 mg  4 mg Oral Q6H PRN Lanae Crumbly, PA-C       Or  . ondansetron [facility]) injection 4 mg  4 mg Intravenous Q6H PRN Lanae Crumbly, PA-C      . oxyCODONE (Oxy IR/ROXICODONE) immediate release tablet 10 mg  10 mg Oral Q4H PRN Lanae Crumbly, PA-C   10 mg at 11/19/15 [id number]  . [START ON 11/20/2015] pneumococcal 23 valent vaccine (PNU-IMMUNE) injection 0.5 mL  0.5 mL Intramuscular Tomorrow-1000 Marybelle Killings, MD      . polyethylene glycol (MIRALAX / GLYCOLAX) packet 17 g  17 g Oral Daily PRN Lanae Crumbly, PA-C      . potassium chloride SA (K-DUR,KLOR-CON) CR tablet 40 mEq  40 mEq Oral Q4H Marybelle Killings, MD   40 mEq at 11/19/15 1115  . temazepam (RESTORIL) capsule 15 mg  15 mg Oral QHS PRN,MR X 1 Lanae Crumbly, PA-C         Discharge Medications: Please see discharge summary for a list of discharge medications.  Relevant Imaging Results:  Relevant Lab Results:   Additional Information SSN: [id number]  Caroline Sauger, LCSW

## 2015-11-19 NOTE — Evaluation (Signed)
Physical Therapy Evaluation Patient Details Name: Peter Manning MRN: [id number] DOB: [birth year] Today's Date: 11/19/2015   History of Present Illness  pt presents post L THRevision.  pt with hx of OSA, Chronic Bronchitis, HTN, L THR, and Back Surgery.  Clinical Impression  Pt at this time requires A for bed mobility and coming to standing.  Pt states he discussed with MD that he wanted to go to rehab at D/C since he would be home alone and need to do everything for himself.  Feel this would be an appropriate D/C option for pt to maximize his independence prior to return home alone.  Will continue to follow.      Follow Up Recommendations SNF    Equipment Recommendations  Rolling walker with 5" wheels;3in1 (PT)    Recommendations for Other Services       Precautions / Restrictions Precautions Precautions: Fall;Posterior Hip Precaution Booklet Issued: Yes (comment) Precaution Comments: Reviewed hip precautions. Required Braces or Orthoses: Knee Immobilizer - Left Knee Immobilizer - Left: On at all times Restrictions Weight Bearing Restrictions: Yes LLE Weight Bearing: Weight bearing as tolerated      Mobility  Bed Mobility Overal bed mobility: Needs Assistance Bed Mobility: Supine to Sit;Sit to Supine     Supine to sit: Mod assist;HOB elevated Sit to supine: Mod assist   General bed mobility comments: Even with HOB elevated pt needs A with L LE and bringing trunk up to sitting.    Transfers Overall transfer level: Needs assistance Equipment used: Rolling walker (2 wheeled) Transfers: Sit to/from Stand Sit to Stand: Mod assist         General transfer comment: cues for UE use and positioning of Bil LEs.  pt needs A for power up to standing and for balance.    Ambulation/Gait Ambulation/Gait assistance: Min guard Ambulation Distance (Feet): 75 Feet Assistive device: Rolling walker (2 wheeled) Gait Pattern/deviations: Step-to pattern;Decreased step length -  right;Decreased stance time - left;Decreased stride length     General Gait Details: pt needs cues for gait sequencing, more upright posture and use of RW.    Stairs            Wheelchair Mobility    Modified Rankin (Stroke Patients Only)       Balance Overall balance assessment: Needs assistance Sitting-balance support: Single extremity supported;Feet supported Sitting balance-Leahy Scale: Fair Sitting balance - Comments: Uses UE support more for pressure relief on L hip than truly for balance.     Standing balance support: Bilateral upper extremity supported;During functional activity Standing balance-Leahy Scale: Poor                               Pertinent Vitals/Pain Pain Assessment: 0-10 Pain Score: 6  Pain Location: L hip with movement. Pain Descriptors / Indicators: Aching;Grimacing;Guarding Pain Intervention(s): Monitored during session;Premedicated before session;Repositioned    Home Living Family/patient expects to be discharged to:: Unsure Living Arrangements: Alone               Additional Comments: pt indicates he doesn't have anyone to A him at D/C.    Prior Function Level of Independence: Independent               Hand Dominance        Extremity/Trunk Assessment   Upper Extremity Assessment: Defer to OT evaluation           Lower Extremity Assessment: LLE deficits/detail  LLE Deficits / Details: ROM and Strength limited by post-op pain and edema.  Cervical / Trunk Assessment: Normal  Communication   Communication: No difficulties  Cognition Arousal/Alertness: Awake/alert Behavior During Therapy: WFL for tasks assessed/performed Overall Cognitive Status: Within Functional Limits for tasks assessed                      General Comments      Exercises Total Joint Exercises Ankle Circles/Pumps: AROM;Both;10 reps Quad Sets: AROM;Both;10 reps Short Arc Quad: AROM;Left;10 reps Heel Slides:  AAROM;Left;10 reps Hip ABduction/ADduction: AAROM;Left;10 reps      Assessment/Plan    PT Assessment Patient needs continued PT services  PT Diagnosis Abnormality of gait;Acute pain   PT Problem List Decreased strength;Decreased activity tolerance;Decreased balance;Decreased mobility;Decreased knowledge of use of DME;Decreased knowledge of precautions;Pain  PT Treatment Interventions DME instruction;Gait training;Stair training;Functional mobility training;Therapeutic activities;Therapeutic exercise;Balance training;Patient/family education   PT Goals (Current goals can be found in the Care Plan section) Acute Rehab PT Goals Patient Stated Goal: To go to rehab before being home PT Goal Formulation: With patient Time For Goal Achievement: 11/26/15 Potential to Achieve Goals: Good    Frequency 7X/week   Barriers to discharge Decreased caregiver support      Co-evaluation               End of Session Equipment Utilized During Treatment: Gait belt Activity Tolerance: Patient tolerated treatment well Patient left: in bed;with call bell/phone within reach;with family/visitor present Nurse Communication: Mobility status         Time: [id number] PT Time Calculation (min) (ACUTE ONLY): 31 min   Charges:   PT Evaluation $PT Eval Moderate Complexity: 1 Procedure PT Treatments $Gait Training: 8-22 mins   PT G CodesCatarina Hartshorn, [place] 11/19/2015, 9:59 AM

## 2015-11-20 DIAGNOSIS — T84061A Wear of articular bearing surface of internal prosthetic left hip joint, initial encounter: Secondary | ICD-10-CM | POA: Diagnosis not present

## 2015-11-20 LAB — CBC
HCT: 34.6 % — ABNORMAL LOW (ref 39.0–52.0)
Hemoglobin: 11.2 g/dL — ABNORMAL LOW (ref 13.0–17.0)
MCH: 30.7 pg (ref 26.0–34.0)
MCHC: 32.4 g/dL (ref 30.0–36.0)
MCV: 94.8 fL (ref 78.0–100.0)
PLATELETS: 252 10*3/uL (ref 150–400)
RBC: 3.65 MIL/uL — AB (ref 4.22–5.81)
RDW: 12.2 % (ref 11.5–15.5)
WBC: 12.6 10*3/uL — AB (ref 4.0–10.5)

## 2015-11-20 MED ORDER — METHOCARBAMOL 750 MG PO TABS: 750.0000 mg | ORAL_TABLET | Freq: Four times a day (QID) | ORAL | Status: DC | PRN

## 2015-11-20 MED ORDER — ASPIRIN 325 MG PO TBEC: 325.0000 mg | DELAYED_RELEASE_TABLET | Freq: Every day | ORAL | Status: DC

## 2015-11-20 MED ORDER — OXYCODONE-ACETAMINOPHEN 10-325 MG PO TABS: 1.0000 | ORAL_TABLET | ORAL | Status: DC | PRN

## 2015-11-20 NOTE — Discharge Summary (Signed)
Patient ID: Peter Manning MRN: [id number] DOB/AGE: September 20, 1955 60 y.o.  Admit date: 11/18/2015 Discharge date: 11/20/2015  Admission Diagnoses:  Active Problems:   S/P revision of total hip   Discharge Diagnoses:  Active Problems:   S/P revision of total hip  status post Procedure(s): LEFT TOTAL HIP REVISION  Past Medical History  Diagnosis Date  . Hypertension   . Hyperlipidemia   . OSA (obstructive sleep apnea) 12/09/2011    PSG 01/03/12>>AHI 45.7, SpO2 low 73%, PLMI 0.  CPAP 15 cm H2O>>AHI 0, +R.   [place] Bronchitis   . Arthritis   . Pneumonia 2015  . Sleep apnea     Surgeries: Procedure(s): LEFT TOTAL HIP REVISION on 11/18/2015   Consultants:    Discharged Condition: Improved  Hospital Course: Peter Manning is an 60 y.o. male who was admitted 11/18/2015 for operative treatment of BROKEN POLYETHYLENE LINER. S/P PREVIOUS TOTAL HIP REPLACEMENT. Patient failed conservative treatments (please see the history and physical for the specifics) and had severe unremitting pain that affects sleep, daily activities and work/hobbies. After pre-op clearance, the patient was taken to the operating room on 11/18/2015 and underwent  Procedure(s): LEFT TOTAL HIP REVISION.    Patient was given perioperative antibiotics: Anti-infectives    Start     Dose/Rate Route Frequency Ordered Stop   11/18/15 1830  ceFAZolin (ANCEF) IVPB 1 g/50 mL premix     1 g 100 mL/hr over 30 Minutes Intravenous Every 6 hours 11/18/15 1657 11/19/15 0242   11/18/15 0900  ceFAZolin (ANCEF) IVPB 2g/100 mL premix     2 g 200 mL/hr over 30 Minutes Intravenous To Surgery 11/18/15 0852 11/18/15 1239   11/18/15 0852  ceFAZolin (ANCEF) 2-4 GM/100ML-% IVPB    Comments:  Forte, Lindsi   : cabinet override      11/18/15 0852 11/18/15 2059       Patient was given sequential compression devices and early ambulation to prevent DVT.   Patient benefited maximally from hospital stay and there were no complications. At the time  of discharge, the patient was urinating/moving their bowels without difficulty, tolerating a regular diet, pain is controlled with oral pain medications and they have been cleared by PT/OT.   Recent vital signs: Patient Vitals for the past 24 hrs:  BP Temp Temp src Pulse Resp SpO2  11/20/15 0426 126/60 mmHg 100 F (37.8 C) Oral 81 16 97 %  11/19/15 2300 - 99 F (37.2 C) - - - -  11/19/15 2001 (!) 129/59 mmHg 100.1 F (37.8 C) Oral 82 16 94 %  11/19/15 1300 138/60 mmHg 99.6 F (37.6 C) Oral 80 18 93 %  11/19/15 1025 135/63 mmHg 100 F (37.8 C) Oral 82 16 93 %     Recent laboratory studies:  Recent Labs  11/19/15 0614 11/20/15 0728  WBC 10.7* 12.6*  HGB 11.6* 11.2*  HCT 34.3* 34.6*  PLT 234 252  NA 137  --   K 3.0*  --   CL 97*  --   CO2 28  --   BUN 6  --   CREATININE 0.98  --   GLUCOSE 144*  --   CALCIUM 8.8*  --      Discharge Medications:     Medication List    STOP taking these medications        HYDROcodone-acetaminophen 5-325 MG tablet  Commonly known as:  NORCO/VICODIN      TAKE these medications        albuterol  108 (90 Base) MCG/ACT inhaler  Commonly known as:  PROVENTIL HFA;VENTOLIN HFA  Inhale 1-2 puffs into the lungs every 6 (six) hours as needed for wheezing or shortness of breath.     aspirin 325 MG EC tablet  Take 1 tablet (325 mg total) by mouth daily with breakfast.     lisinopril 20 MG tablet  Commonly known as:  PRINIVIL,ZESTRIL  Take 20 mg by mouth daily.     methocarbamol 750 MG tablet  Commonly known as:  ROBAXIN  Take 1 tablet (750 mg total) by mouth every 6 (six) hours as needed for muscle spasms.     oxyCODONE-acetaminophen 10-325 MG tablet  Commonly known as:  PERCOCET  Take 1 tablet by mouth every 4 (four) hours as needed for pain. scheduled        Diagnostic Studies: Dg Chest 2 View  11/05/2015  CLINICAL DATA:  [place] tonight while going to the bathroom. EXAM: CHEST  2 VIEW COMPARISON:  09/27/2015 FINDINGS: The lungs are  clear. The pulmonary vasculature is normal. Heart size is normal. Hilar and mediastinal contours are unremarkable. There is no pleural effusion. IMPRESSION: No active cardiopulmonary disease. Electronically Signed   By: Andreas Newport M.D.   On: 11/05/2015 04:02   Dg Lumbar Spine Complete  11/05/2015  CLINICAL DATA:  [place] tonight while going to the bathroom. Left hip pain and low back pain. EXAM: LUMBAR SPINE - COMPLETE 4+ VIEW COMPARISON:  02/18/2014 FINDINGS: Postoperative change with L3 through L5 laminectomies and with posterior rod and screw fixation from L4 to the sacrum. Surgical hardware appears intact without change in position. Alignment of the lumbar vertebrae is unchanged. Degenerative changes in the lumbar spine with narrowed interspaces and endplate hypertrophic changes. No vertebral compression deformities. Degenerative changes demonstrate progression since the previous study. Visualize sacrum appears intact. Incidental note of displacement of the liner for the acetabular cup of the left hip arthroplasty. IMPRESSION: Postoperative and degenerative changes in the lumbar spine. No acute displaced fractures identified. Electronically Signed   By: Lucienne Capers M.D.   On: 11/05/2015 04:06   Dg Pelvis Portable  11/18/2015  CLINICAL DATA:  Post left total hip revision EXAM: PORTABLE PELVIS 1-2 VIEWS COMPARISON:  02/11/2011 FINDINGS: Single frontal view of the pelvis submitted. There is left hip prosthesis with anatomic alignment. Skin staples are noted left hip region. IMPRESSION: Left hip prosthesis with anatomic alignment. Electronically Signed   By: Lahoma Crocker M.D.   On: 11/18/2015 17:23   Dg Hip Unilat With Pelvis 2-3 Views Left  11/05/2015  CLINICAL DATA:  [place] tonight while going to the bathroom EXAM: DG HIP (WITH OR WITHOUT PELVIS) 2-3V LEFT COMPARISON:  10/27/2015 FINDINGS: Negative for acute fracture dislocation. There is rotation of a portion of the acetabular component, further away  compared to its position of 10/27/2015. Pubic symphysis and sacroiliac joints are intact. IMPRESSION: Further rotational displacement of the liner compared to 10/27/2015. No fracture or dislocation. Electronically Signed   By: Andreas Newport M.D.   On: 11/05/2015 04:05   Dg Hip Unilat With Pelvis 2-3 Views Left  10/27/2015  CLINICAL DATA:  Left hip pain and popping for 2 days. Prior hip replacement. EXAM: DG HIP (WITH OR WITHOUT PELVIS) 2-3V LEFT COMPARISON:  02/18/2014 FINDINGS: Sequelae of left total hip arthroplasty are again identified. There is no evidence of dislocation or periprosthetic fracture. There is new displacement of the polyethylene liner which appears rotated dorsally. Mild right hip osteoarthrosis is again seen. Lumbosacral fusion  instrumentation is partially visualized. Pelvic phleboliths are noted. IMPRESSION: 1. Prior left total hip arthroplasty with new displacement of the polyethylene liner. 2. No acute fracture or dislocation. Electronically Signed   By: Logan Bores M.D.   On: 10/27/2015 09:36          Follow-up Information    Schedule an appointment as soon as possible for a visit with Marybelle Killings, MD.   Specialty:  Orthopedic Surgery   Why:  needs return office visit 2 weeks postop   Contact information:   [place] [address] [phone]       Discharge Plan:  discharge to SNF  Disposition:     Signed: Lanae Crumbly for Rodell Perna MD [facility] orthopedics [phone] 11/20/2015, 9:58 AM

## 2015-11-20 NOTE — Progress Notes (Signed)
Physical Therapy Treatment Patient Details Name: Peter Manning MRN: [id number] DOB: [birth year] Today's Date: 11/20/2015    History of Present Illness pt presents post L THRevision.  pt with hx of OSA, Chronic Bronchitis, HTN, L THR, and Back Surgery.    PT Comments    Pt is making good progress. He increased his ambulation distance. He continues to require assist for transfers. He had no significant increase in pain with treatment. 5/5 pain pre and post treatment.   Follow Up Recommendations  SNF     Equipment Recommendations  Rolling walker with 5" wheels;3in1 (PT)    Recommendations for Other Services       Precautions / Restrictions Precautions Precautions: Fall;Posterior Hip Precaution Comments: Reviewed hip precautions. Required Braces or Orthoses: Knee Immobilizer - Left Knee Immobilizer - Left: On at all times Restrictions Weight Bearing Restrictions: Yes LLE Weight Bearing: Weight bearing as tolerated    Mobility  Bed Mobility               General bed mobility comments: pt sitting in recliner  Transfers Overall transfer level: Needs assistance Equipment used: Rolling walker (2 wheeled) Transfers: Sit to/from Stand Sit to Stand: Min assist         General transfer comment: Pt shifts posterior when stading. Requires min a for safety.   Ambulation/Gait Ambulation/Gait assistance: Min guard Ambulation Distance (Feet): 175 Feet Assistive device: Rolling walker (2 wheeled) Gait Pattern/deviations: Step-through pattern     General Gait Details: Slow step through gait pattern. Decreased on L lower extremity. Min cueing for weight bearing on UE.    Stairs            Wheelchair Mobility    Modified Rankin (Stroke Patients Only)       Balance Overall balance assessment: Needs assistance Sitting-balance support: No upper extremity supported Sitting balance-Leahy Scale: Good     Standing balance support: Bilateral upper extremity  supported Standing balance-Leahy Scale: Poor                      Cognition Arousal/Alertness: Awake/alert Behavior During Therapy: WFL for tasks assessed/performed Overall Cognitive Status: Within Functional Limits for tasks assessed                      Exercises Total Joint Exercises Ankle Circles/Pumps: AROM;Both;20 reps Quad Sets: AROM;Both;15 reps Short Arc Quad: AROM;Left;15 reps Heel Slides: AROM;15 reps Long Arc Quad: AROM;Left;15 reps    General Comments        Pertinent Vitals/Pain Pain Assessment: 0-10 Pain Score: 5  Pain Location: L Hip  Pain Descriptors / Indicators: Aching Pain Intervention(s): Monitored during session    Home Living                      Prior Function            PT Goals (current goals can now be found in the care plan section) Acute Rehab PT Goals Patient Stated Goal: To go to rehab before being home PT Goal Formulation: With patient Time For Goal Achievement: 11/26/15 Potential to Achieve Goals: Good Progress towards PT goals: Progressing toward goals    Frequency  7X/week    PT Plan Current plan remains appropriate    Co-evaluation             End of Session Equipment Utilized During Treatment: Gait belt;Left knee immobilizer Activity Tolerance: Patient tolerated treatment well Patient left: in chair;with call bell/phone  within reach     Time: 1327-1358 PT Time Calculation (min) (ACUTE ONLY): 31 min  Charges:  $Gait Training: 8-22 mins $Therapeutic Exercise: 8-22 mins                    G Codes:      Carney Living PT DPT  11/20/2015, 2:21 PM

## 2015-11-20 NOTE — Progress Notes (Signed)
OT Cancellation Note  Patient Details Name: Peter Manning MRN: [id number] DOB: 01-03-1956   Cancelled Treatment:    Reason Eval/Treat Not Completed: Other (comment) Pt discharging to SNF for post-acute rehab stay. Will defer OT evaluation and treatment to next venue of care.  Redmond Baseman, OTR/L Pager: [phone] 11/20/2015, 10:06 AM

## 2015-11-20 NOTE — Discharge Instructions (Signed)
INSTRUCTIONS AFTER TOTAL HIP JOINT REPLACEMENT  ° °o Remove items at home which could result in a fall. This includes throw rugs or furniture in walking pathways °o ICE to the affected joint every three hours while awake for 30 minutes at a time, for at least the first 3-5 days, and then as needed for pain and swelling.  Continue to use ice for pain and swelling. You may notice swelling that will progress down to the foot and ankle.  This is normal after surgery.  Elevate your leg when you are not up walking on it.   °o Continue to use the breathing machine you got in the hospital (incentive spirometer) which will help keep your temperature down.  It is common for your temperature to cycle up and down following surgery, especially at night when you are not up moving around and exerting yourself.  The breathing machine keeps your lungs expanded and your temperature down. ° ° °DIET:  As you were doing prior to hospitalization, we recommend a well-balanced diet. ° °DRESSING / WOUND CARE / SHOWERING ° °You may change your dressing 3-5 days after surgery.  Then change the dressing every day with sterile gauze.  Please use good hand washing techniques before changing the dressing.  Do not use any lotions or creams on the incision until instructed by your surgeon. ° °ACTIVITY ° °o Increase activity slowly as tolerated, but follow the weight bearing instructions below.   °o No driving for 6 weeks or until further direction given by your physician.  You cannot drive while taking narcotics.  °o No lifting or carrying greater than 10 lbs. until further directed by your surgeon. °o Avoid periods of inactivity such as sitting longer than an hour when not asleep. This helps prevent blood clots.  °o You may return to work once you are authorized by your doctor.  ° ° ° °WEIGHT BEARING  ° °Weight bearing as tolerated with assist device (walker, cane, etc) as directed, use it as long as suggested by your surgeon or therapist,  typically at least 4-6 weeks. ° ° °EXERCISES ° °Results after joint replacement surgery are often greatly improved when you follow the exercise, range of motion and muscle strengthening exercises prescribed by your doctor. Safety measures are also important to protect the joint from further injury. Any time any of these exercises cause you to have increased pain or swelling, decrease what you are doing until you are comfortable again and then slowly increase them. If you have problems or questions, call your caregiver or physical therapist for advice.  ° °Rehabilitation is important following a joint replacement. After just a few days of immobilization, the muscles of the leg can become weakened and shrink (atrophy).  These exercises are designed to build up the tone and strength of the thigh and leg muscles and to improve motion. Often times heat used for twenty to thirty minutes before working out will loosen up your tissues and help with improving the range of motion but do not use heat for the first two weeks following surgery (sometimes heat can increase post-operative swelling).  ° °These exercises can be done on a training (exercise) mat, on the floor, on a table or on a bed. Use whatever works the best and is most comfortable for you.    Use music or television while you are exercising so that the exercises are a pleasant break in your day. This will make your life better with the exercises acting as   a break in your routine that you can look forward to.   Perform all exercises about fifteen times, three times per day or as directed.  You should exercise both the operative leg and the other leg as well.  Exercises include:    Quad Sets - Tighten up the muscle on the front of the thigh (Quad) and hold for 5-10 seconds.    Straight Leg Raises - With your knee straight (if you were given a brace, keep it on), lift the leg to 60 degrees, hold for 3 seconds, and slowly lower the leg.  Perform this exercise  against resistance later as your leg gets stronger.   Leg Slides: Lying on your back, slowly slide your foot toward your buttocks, bending your knee up off the floor (only go as far as is comfortable). Then slowly slide your foot back down until your leg is flat on the floor again.   Angel Wings: Lying on your back spread your legs to the side as far apart as you can without causing discomfort.   Hamstring Strength:  Lying on your back, push your heel against the floor with your leg straight by tightening up the muscles of your buttocks.  Repeat, but this time bend your knee to a comfortable angle, and push your heel against the floor.  You may put a pillow under the heel to make it more comfortable if necessary.   A rehabilitation program following joint replacement surgery can speed recovery and prevent re-injury in the future due to weakened muscles. Contact your doctor or a physical therapist for more information on knee rehabilitation.    CONSTIPATION  Constipation is defined medically as fewer than three stools per week and severe constipation as less than one stool per week.  Even if you have a regular bowel pattern at home, your normal regimen is likely to be disrupted due to multiple reasons following surgery.  Combination of anesthesia, postoperative narcotics, change in appetite and fluid intake all can affect your bowels.   YOU MUST use at least one of the following options; they are listed in order of increasing strength to get the job done.  They are all available over the counter, and you may need to use some, POSSIBLY even all of these options:    Drink plenty of fluids (prune juice may be helpful) and high fiber foods Colace 100 mg by mouth twice a day  Senokot for constipation as directed and as needed Dulcolax (bisacodyl), take with full glass of water  Miralax (polyethylene glycol) once or twice a day as needed.  If you have tried all these things and are unable to have a  bowel movement in the first 3-4 days after surgery call either your surgeon or your primary doctor.    If you experience loose stools or diarrhea, hold the medications until you stool forms back up.  If your symptoms do not get better within 1 week or if they get worse, check with your doctor.  If you experience "the worst abdominal pain ever" or develop nausea or vomiting, please contact the office immediately for further recommendations for treatment.   ITCHING:  If you experience itching with your medications, try taking only a single pain pill, or even half a pain pill at a time.  You can also use Benadryl over the counter for itching or also to help with sleep.   TED HOSE STOCKINGS:  Use stockings on both legs until for at least 2 weeks  or as directed by physician office. They may be removed at night for sleeping. ° °MEDICATIONS:  See your medication summary on the “After Visit Summary” that nursing will review with you.  You may have some home medications which will be placed on hold until you complete the course of blood thinner medication.  It is important for you to complete the blood thinner medication as prescribed. ° °PRECAUTIONS:  If you experience chest pain or shortness of breath - call 911 immediately for transfer to the hospital emergency department.  ° °If you develop a fever greater that 101 F, purulent drainage from wound, increased redness or drainage from wound, foul odor from the wound/dressing, or calf pain - CONTACT YOUR SURGEON.   °                                                °FOLLOW-UP APPOINTMENTS:  If you do not already have a post-op appointment, please call the office for an appointment to be seen by your surgeon.  Guidelines for how soon to be seen are listed in your “After Visit Summary”, but are typically between 1-4 weeks after surgery. ° °OTHER INSTRUCTIONS:  ° °Knee Replacement:  Do not place pillow under knee, focus on keeping the knee straight while resting. CPM  instructions: 0-90 degrees, 2 hours in the morning, 2 hours in the afternoon, and 2 hours in the evening. Place foam block, curve side up under heel at all times except when in CPM or when walking.  DO NOT modify, tear, cut, or change the foam block in any way. ° °MAKE SURE YOU:  °• Understand these instructions.  °• Get help right away if you are not doing well or get worse.  ° ° °Thank you for letting us be a part of your medical care team.  It is a privilege we respect greatly.  We hope these instructions will help you stay on track for a fast and full recovery!  ° °

## 2015-11-20 NOTE — Progress Notes (Signed)
Subjective: 2 Days Post-Op Procedure(s) (LRB): LEFT TOTAL HIP REVISION (Left) Patient reports pain as mild and moderate.    Objective: Vital signs in last 24 hours: Temp:  [99 F (37.2 C)-100.1 F (37.8 C)] 100 F (37.8 C) (05/19 0426) Pulse Rate:  [80-82] 81 (05/19 0426) Resp:  [16-18] 16 (05/19 0426) BP: (126-138)/(59-63) 126/60 mmHg (05/19 0426) SpO2:  [93 %-97 %] 97 % (05/19 0426)  Intake/Output from previous day: 05/18 0701 - 05/19 0700 In: 560 [P.O.:480; I.V.:80] Out: 2500 [Urine:2500] Intake/Output this shift:     Recent Labs  11/19/15 0614 11/20/15 0728  HGB 11.6* 11.2*    Recent Labs  11/19/15 0614 11/20/15 0728  WBC 10.7* 12.6*  RBC 3.56* 3.65*  HCT 34.3* 34.6*  PLT 234 252    Recent Labs  11/19/15 0614  NA 137  K 3.0*  CL 97*  CO2 28  BUN 6  CREATININE 0.98  GLUCOSE 144*  CALCIUM 8.8*   No results for input(s): LABPT, INR in the last 72 hours.  Neurologically intact  Assessment/Plan: 2 Days Post-Op Procedure(s) (LRB): LEFT TOTAL HIP REVISION (Left) Up with therapy Discharge to SNF  Jersie Beel C 11/20/2015, 9:29 AM

## 2015-11-20 NOTE — Consult Note (Signed)
   [facility] CM Inpatient Consult   11/20/2015  Geovonni Tiongco 1955/08/05 [id number] Patient screened for [facility] Care Management services.   Chart review reveals patient will discharge to a skilled nursing facility at this time.  For questions, please contact:  Natividad Brood, RN BSN [facility] Liaison  [phone] business mobile phone Toll free office [phone]

## 2015-11-20 NOTE — Progress Notes (Signed)
Physical Therapy Treatment Patient Details Name: Peter Manning MRN: [id number] DOB: [birth year] Today's Date: 11/20/2015    History of Present Illness pt presents post L THRevision.  pt with hx of OSA, Chronic Bronchitis, HTN, L THR, and Back Surgery.    PT Comments    Pt very agreeable to increase ambulation distance this am and attempts to follow all cueing.  Pt continues with heavy reliance on UEs during all mobility.  Continue to feel SNF for further rehab is most appropriate to allow pt to regain independence prior to returning to home alone.  Will continue to follow.    Follow Up Recommendations  SNF     Equipment Recommendations  Rolling walker with 5" wheels;3in1 (PT)    Recommendations for Other Services       Precautions / Restrictions Precautions Precautions: Fall;Posterior Hip Precaution Comments: Reviewed hip precautions. Required Braces or Orthoses: Knee Immobilizer - Left Knee Immobilizer - Left: On at all times Restrictions Weight Bearing Restrictions: Yes LLE Weight Bearing: Weight bearing as tolerated    Mobility  Bed Mobility               General bed mobility comments: pt sitting in recliner  Transfers Overall transfer level: Needs assistance Equipment used: Rolling walker (2 wheeled) Transfers: Sit to/from Stand Sit to Stand: Min assist         General transfer comment: pt with decreased A needed today.  No cues needed for technique.    Ambulation/Gait Ambulation/Gait assistance: Min guard Ambulation Distance (Feet): 150 Feet Assistive device: Rolling walker (2 wheeled) Gait Pattern/deviations: Step-to pattern;Decreased step length - right;Decreased stance time - left;Decreased stride length     General Gait Details: pt with heavy reliance on UE support, but with cueing is able to increase weightbearing on L LE.  cues for mroe upright posture.   Stairs            Wheelchair Mobility    Modified Rankin (Stroke Patients  Only)       Balance Overall balance assessment: Needs assistance Sitting-balance support: No upper extremity supported;Feet supported Sitting balance-Leahy Scale: Good     Standing balance support: Bilateral upper extremity supported;During functional activity Standing balance-Leahy Scale: Poor                      Cognition Arousal/Alertness: Awake/alert Behavior During Therapy: WFL for tasks assessed/performed Overall Cognitive Status: Within Functional Limits for tasks assessed                      Exercises Total Joint Exercises Ankle Circles/Pumps: AROM;Both;10 reps Quad Sets: AROM;Both;10 reps Short Arc Quad: AROM;Left;10 reps Hip ABduction/ADduction: AROM;Left;10 reps Long Arc Quad: AROM;Left;15 reps    General Comments        Pertinent Vitals/Pain Pain Assessment: 0-10 Pain Score: 5  Pain Location: L hip Pain Descriptors / Indicators: Tightness Pain Intervention(s): Monitored during session;Premedicated before session;Repositioned    Home Living                      Prior Function            PT Goals (current goals can now be found in the care plan section) Acute Rehab PT Goals Patient Stated Goal: To go to rehab before being home PT Goal Formulation: With patient Time For Goal Achievement: 11/26/15 Potential to Achieve Goals: Good Progress towards PT goals: Progressing toward goals    Frequency  7X/week  PT Plan Current plan remains appropriate    Co-evaluation             End of Session Equipment Utilized During Treatment: Gait belt;Left knee immobilizer Activity Tolerance: Patient tolerated treatment well Patient left: in chair;with call bell/phone within reach     Time: 0837-0900 PT Time Calculation (min) (ACUTE ONLY): 23 min  Charges:  $Gait Training: 8-22 mins $Therapeutic Exercise: 8-22 mins                    G CodesCatarina Manning, [place] 11/20/2015, 9:07 AM

## 2015-11-21 LAB — CBC
HCT: 32.1 % — ABNORMAL LOW (ref 39.0–52.0)
Hemoglobin: 10.5 g/dL — ABNORMAL LOW (ref 13.0–17.0)
MCH: 30.7 pg (ref 26.0–34.0)
MCHC: 32.7 g/dL (ref 30.0–36.0)
MCV: 93.9 fL (ref 78.0–100.0)
Platelets: 253 10*3/uL (ref 150–400)
RBC: 3.42 MIL/uL — ABNORMAL LOW (ref 4.22–5.81)
RDW: 12.2 % (ref 11.5–15.5)
WBC: 10.5 10*3/uL (ref 4.0–10.5)

## 2015-11-21 NOTE — Discharge Summary (Signed)
Physician Discharge Summary  Patient ID: Peter Manning MRN: [id number] DOB/AGE: 59-09-1955 59 y.o.  Admit date: 11/18/2015 Discharge date: 11/21/2015  Admission Diagnoses:Mechanical failure total hip arthroplasty  Discharge Diagnoses:  Active Problems:   S/P revision of total hip   Discharged Condition: stable  Hospital Course: Patient's hospital course was essentially unremarkable. He underwent revision of the total hip arthroplasty. Postoperatively patient progressed slowly with therapy and was discharged to skilled nursing in stable condition.  Consults: None  Significant Diagnostic Studies: labs: Routine labs  Treatments: surgery: See operative note  Discharge Exam: Blood pressure 115/64, pulse 78, temperature 99.3 F (37.4 C), temperature source Oral, resp. rate 18, height 5\' 8"  (1.727 m), weight 110.678 kg (244 lb), SpO2 98 %. Incision/Wound: Dressing clean and dry  Disposition: 01-Home or Self Care  Discharge Instructions    Call MD / Call 911    Complete by:  As directed   If you experience chest pain or shortness of breath, CALL 911 and be transported to the hospital emergency room.  If you develope a fever above 101 F, pus (white drainage) or increased drainage or redness at the wound, or calf pain, call your surgeon's office.     Constipation Prevention    Complete by:  As directed   Drink plenty of fluids.  Prune juice may be helpful.  You may use a stool softener, such as Colace (over the counter) 100 mg twice a day.  Use MiraLax (over the counter) for constipation as needed.     Diet - low sodium heart healthy    Complete by:  As directed      Increase activity slowly as tolerated    Complete by:  As directed             Medication List    STOP taking these medications        HYDROcodone-acetaminophen 5-325 MG tablet  Commonly known as:  NORCO/VICODIN      TAKE these medications        albuterol 108 (90 Base) MCG/ACT inhaler  Commonly known as:   PROVENTIL HFA;VENTOLIN HFA  Inhale 1-2 puffs into the lungs every 6 (six) hours as needed for wheezing or shortness of breath.     aspirin 325 MG EC tablet  Take 1 tablet (325 mg total) by mouth daily with breakfast.     lisinopril 20 MG tablet  Commonly known as:  PRINIVIL,ZESTRIL  Take 20 mg by mouth daily.     methocarbamol 750 MG tablet  Commonly known as:  ROBAXIN  Take 1 tablet (750 mg total) by mouth every 6 (six) hours as needed for muscle spasms.     oxyCODONE-acetaminophen 10-325 MG tablet  Commonly known as:  PERCOCET  Take 1 tablet by mouth every 4 (four) hours as needed for pain. scheduled           Follow-up Information    Schedule an appointment as soon as possible for a visit with Marybelle Killings, MD.   Specialty:  Orthopedic Surgery   Why:  needs return office visit 2 weeks postop   Contact information:   [place] [address] [phone]       Signed: Newt Minion 11/21/2015, 8:12 AM

## 2015-11-21 NOTE — Progress Notes (Signed)
Report called to [place] at [facility] [employer]. Patient discharged via [facility] at 1409. Florian Buff, RN

## 2015-11-21 NOTE — Clinical Social Work Placement (Signed)
   CLINICAL SOCIAL WORK PLACEMENT  NOTE  Date:  11/21/2015  Patient Details  Name: Peter Manning MRN: [id number] Date of Birth: [birth year]  Clinical Social Work is seeking post-discharge placement for this patient at the [place] level of care (*CSW will initial, date and re-position this form in  chart as items are completed):  Yes   Patient/family provided with [facility] Work Department's list of facilities offering this level of care within the geographic area requested by the patient (or if unable, by the patient's family).  Yes   Patient/family informed of their freedom to choose among providers that offer the needed level of care, that participate in Medicare, Medicaid or managed care program needed by the patient, have an available bed and are willing to accept the patient.  Yes   Patient/family informed of [facility]'s ownership interest in [facility] and [facility], as well as of the fact that they are under no obligation to receive care at these facilities.  PASRR submitted to EDS on 11/19/15     PASRR number received on 11/19/15     Existing PASRR number confirmed on       FL2 transmitted to all facilities in geographic area requested by pt/family on 11/19/15     FL2 transmitted to all facilities within larger geographic area on       Patient informed that his/her managed care company has contracts with or will negotiate with certain facilities, including the following:        Yes   Patient/family informed of bed offers received.  Patient chooses bed at [facility]     Physician recommends and patient chooses bed at      Patient to be transferred to [facility] on 11/21/15.  Patient to be transferred to facility by Ambulance [name])     Patient family notified on 11/21/15 of transfer.  Name of family member notified:  Sister Lanell Persons     PHYSICIAN Please sign FL2, Please prepare priority  discharge summary, including medications, Please prepare prescriptions     Additional Comment: Ok per MD for d/c today to SNF at [facility]. Nursing notified to call report to facility; d/c summary sent to SNF for review and discussed with [name]- Admissions at [facility].  Patient and his sister [name] are agreeable to d/c to SNF today.  No further SW needs identified. CSW signing off.     _______________________________________________ Williemae Area, LCSW 11/21/2015, 10:59 AM

## 2015-11-21 NOTE — Care Management Important Message (Signed)
Important Message  Patient Details  Name: Peter Manning MRN: [id number] Date of Birth: Dec 05, 1955   Medicare Important Message Given:  Yes    Ninfa Meeker, RN 11/21/2015, 9:01 AM

## 2016-04-07 ENCOUNTER — Emergency Department (HOSPITAL_COMMUNITY)
Admission: EM | Admit: 2016-04-07 | Discharge: 2016-04-07 | Disposition: A | Discharge status: Home / Self Care | Payer: Medicare Other | Attending: Emergency Medicine | Admitting: Emergency Medicine

## 2016-04-07 ENCOUNTER — Encounter (HOSPITAL_COMMUNITY): Payer: Self-pay

## 2016-04-07 DIAGNOSIS — M545 Low back pain, unspecified: Secondary | ICD-10-CM

## 2016-04-07 DIAGNOSIS — I10 Essential (primary) hypertension: Secondary | ICD-10-CM | POA: Diagnosis not present

## 2016-04-07 DIAGNOSIS — Z79899 Other long term (current) drug therapy: Secondary | ICD-10-CM | POA: Insufficient documentation

## 2016-04-07 DIAGNOSIS — G8929 Other chronic pain: Secondary | ICD-10-CM | POA: Insufficient documentation

## 2016-04-07 DIAGNOSIS — R059 Cough, unspecified: Secondary | ICD-10-CM

## 2016-04-07 DIAGNOSIS — Z7982 Long term (current) use of aspirin: Secondary | ICD-10-CM | POA: Insufficient documentation

## 2016-04-07 DIAGNOSIS — Z96642 Presence of left artificial hip joint: Secondary | ICD-10-CM | POA: Insufficient documentation

## 2016-04-07 DIAGNOSIS — F17210 Nicotine dependence, cigarettes, uncomplicated: Secondary | ICD-10-CM | POA: Insufficient documentation

## 2016-04-07 DIAGNOSIS — R05 Cough: Secondary | ICD-10-CM

## 2016-04-07 MED ORDER — CYCLOBENZAPRINE HCL 10 MG PO TABS: 10.0000 mg | ORAL_TABLET | Freq: Two times a day (BID) | ORAL | 0 refills | Status: DC | PRN

## 2016-04-07 MED ORDER — MELOXICAM 7.5 MG PO TABS: 7.5000 mg | ORAL_TABLET | Freq: Every day | ORAL | 0 refills | Status: DC

## 2016-04-07 MED ORDER — DOXYCYCLINE HYCLATE 100 MG PO CAPS: 100.0000 mg | ORAL_CAPSULE | Freq: Two times a day (BID) | ORAL | 0 refills | Status: DC

## 2016-04-07 NOTE — ED Provider Notes (Signed)
[facility] DEPT Provider Note   CSN: [id number] Arrival date & time: 04/07/16  0919     History   Chief Complaint No chief complaint on file.   HPI Peter Manning is a 60 y.o. male.  HPI  This is a 60 year old man with a history of chronic back pain who has been out of his narcotic pain medicine since September 18. He states that he was going to a pain clinic and was unable to come in for a pill count has not have been represcribed. He does have a primary care physician and states that they are setting up with another pain clinic. He has had low back pain for many years. He describes no recent injury, numbness, tingling, weakness, loss of bowel or bladder control. He has had no new injury He also is complaining of some cough. He has had some cough and congestion for 2 weeks. He states he intermittently coughs up some yellow substance. He is not short of breath and has not had any fever or chills. He is a smoker. He has not had any chest pain. Back pain is worse with coughing.  Past Medical History:  Diagnosis Date  . Arthritis   . Bronchitis   . Hyperlipidemia   . Hypertension   . OSA (obstructive sleep apnea) 12/09/2011   PSG 01/03/12>>AHI 45.7, SpO2 low 73%, PLMI 0.  CPAP 15 cm H2O>>AHI 0, +R.   Marland Kitchen Pneumonia 2015  . Sleep apnea     Patient Active Problem List   Diagnosis Date Noted  . 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 OSA (obstructive sleep apnea) 12/09/2011  . Chronic bronchitis ([place]) 12/09/2011  . Tobacco abuse 12/09/2011    Past Surgical History:  Procedure Laterality Date  . BACK SURGERY    . JOINT REPLACEMENT    . REVISION TOTAL HIP ARTHROPLASTY Left 11/2015  . TOTAL HIP ARTHROPLASTY    . TOTAL HIP REVISION Left 11/18/2015   Procedure: LEFT TOTAL HIP REVISION;  Surgeon: Marybelle Killings, MD;  Location: [facility];  Service: Orthopedics;  Laterality: Left;       Home Medications    Prior to Admission medications   Medication Sig Start Date End Date Taking?  Authorizing Provider  albuterol (PROVENTIL HFA;VENTOLIN HFA) 108 (90 BASE) MCG/ACT inhaler Inhale 1-2 puffs into the lungs every 6 (six) hours as needed for wheezing or shortness of breath. Patient not taking: Reported on 05/19/2014 04/17/14   Hyman Bible, PA-C  aspirin EC 325 MG EC tablet Take 1 tablet (325 mg total) by mouth daily with breakfast. 11/20/15   Lanae Crumbly, PA-C  lisinopril (PRINIVIL,ZESTRIL) 20 MG tablet Take 20 mg by mouth daily. 04/23/15   Historical Provider, MD  methocarbamol (ROBAXIN) 750 MG tablet Take 1 tablet (750 mg total) by mouth every 6 (six) hours as needed for muscle spasms. 11/20/15   Lanae Crumbly, PA-C  oxyCODONE-acetaminophen (PERCOCET) 10-325 MG tablet Take 1 tablet by mouth every 4 (four) hours as needed for pain. scheduled 11/20/15   Lanae Crumbly, PA-C    Family History Family History  Problem Relation Age of Onset  . Heart attack Father   . Cancer Mother     Social History Social History  Substance Use Topics  . Smoking status: Current Some Day Smoker    Packs/day: 0.50    Years: 39.00    Types: Cigarettes  . Smokeless tobacco: Never Used     Comment: states on and off smoker  .  Alcohol use No     Allergies   Review of patient's allergies indicates no known allergies.   Review of Systems Review of Systems  All other systems reviewed and are negative.    Physical Exam Updated Vital Signs BP 129/86 (BP Location: Right Arm)   Pulse 66   Resp 18   SpO2 100%   Physical Exam  Constitutional: He is oriented to person, place, and time. He appears well-developed.  HENT:  Head: Normocephalic and atraumatic.  Right Ear: External ear normal.  Left Ear: External ear normal.  Nose: Nose normal.  Eyes: EOM are normal.  Neck: No tracheal deviation present.  Cardiovascular: Normal rate and regular rhythm.   Pulmonary/Chest: Effort normal.  Some bibasilar rhonchi is no wheezing or crackles noted  Abdominal: Soft. Bowel sounds are  normal.  Musculoskeletal: Normal range of motion. He exhibits no edema or deformity.  Low back with well healed scar midline. No signs of trauma, erythema, step-off, or swelling. Some diffuse mild tenderness to palpation non- midline.  Neurological: He is alert and oriented to person, place, and time. He has normal reflexes. He displays normal reflexes. No cranial nerve deficit. He exhibits normal muscle tone. Coordination normal.  Skin: Skin is warm and dry. Capillary refill takes less than 2 seconds.  Psychiatric: He has a normal mood and affect. His behavior is normal.  Nursing note and vitals reviewed.    ED Treatments / Results  Labs (all labs ordered are listed, but only abnormal results are displayed) Labs Reviewed - No data to display  EKG  EKG Interpretation None       Radiology No results found.  Procedures Procedures (including critical care time)  Medications Ordered in ED Medications - No data to display   Initial Impression / Assessment and Plan / ED Course  I have reviewed the triage vital signs and the nursing notes.  Pertinent labs & imaging results that were available during my care of the patient were reviewed by me and considered in my medical decision making (see chart for details).  Clinical Course    60 year old man with chronic back pain presents after not being able to have narcotics refilled. I discussed that we will not refill narcotics here in emergency department. He is given a prescription for Flexeril and meloxicam. He also has had a productive cough for 2 weeks. He is a smoker. He has no signs or symptoms of any acute respiratory decompensation or pneumonia. He is given prescription for doxycycline. We have discussed smoking cessation, return precautions, need for follow-up with his primary care physician and he voices understanding   Final Clinical Impressions(s) / ED Diagnoses   Final diagnoses:  Cough  Chronic bilateral low back pain  without sciatica    New Prescriptions New Prescriptions   CYCLOBENZAPRINE (FLEXERIL) 10 MG TABLET    Take 1 tablet (10 mg total) by mouth 2 (two) times daily as needed for muscle spasms.   DOXYCYCLINE (VIBRAMYCIN) 100 MG CAPSULE    Take 1 capsule (100 mg total) by mouth 2 (two) times daily.   MELOXICAM (MOBIC) 7.5 MG TABLET    Take 1 tablet (7.5 mg total) by mouth daily.     Pattricia Boss, MD 04/07/16 [phone]

## 2016-04-07 NOTE — ED Triage Notes (Signed)
Pt ran out of pain meds from pain clinic. Pt c/o worse back pain after running out of pain meds. Also c/o dry mouth and productive cough. Denies fever/chills. Ambulatory w/ steady gait

## 2016-05-02 ENCOUNTER — Emergency Department (HOSPITAL_COMMUNITY)
Admission: EM | Admit: 2016-05-02 | Discharge: 2016-05-02 | Disposition: A | Discharge status: Home / Self Care | Payer: Medicare Other | Attending: Emergency Medicine | Admitting: Emergency Medicine

## 2016-05-02 ENCOUNTER — Emergency Department (HOSPITAL_COMMUNITY): Payer: Medicare Other

## 2016-05-02 ENCOUNTER — Encounter (HOSPITAL_COMMUNITY): Payer: Self-pay | Admitting: *Deleted

## 2016-05-02 DIAGNOSIS — I10 Essential (primary) hypertension: Secondary | ICD-10-CM | POA: Insufficient documentation

## 2016-05-02 DIAGNOSIS — M545 Low back pain, unspecified: Secondary | ICD-10-CM

## 2016-05-02 DIAGNOSIS — Z96642 Presence of left artificial hip joint: Secondary | ICD-10-CM | POA: Diagnosis not present

## 2016-05-02 DIAGNOSIS — G8929 Other chronic pain: Secondary | ICD-10-CM

## 2016-05-02 DIAGNOSIS — Z79899 Other long term (current) drug therapy: Secondary | ICD-10-CM | POA: Insufficient documentation

## 2016-05-02 DIAGNOSIS — Z7982 Long term (current) use of aspirin: Secondary | ICD-10-CM | POA: Diagnosis not present

## 2016-05-02 DIAGNOSIS — F17210 Nicotine dependence, cigarettes, uncomplicated: Secondary | ICD-10-CM | POA: Diagnosis not present

## 2016-05-02 NOTE — ED Provider Notes (Signed)
[facility] DEPT Provider Note   CSN: [id number] Arrival date & time: 05/02/16  0904  By signing my name below, I, Sonum Patel, attest that this documentation has been prepared under the direction and in the presence of [employer]. Electronically Signed: Sonum Patel, Education administrator. 05/02/16. 10:51 AM.  History   Chief Complaint Chief Complaint  Patient presents with  . Back Pain    The history is provided by the patient. No language interpreter was used.     HPI Comments: Peter Manning is a 60 y.o. male with past medical history of chronic back pain who presents to the Emergency Department complaining of 2 weeks of constant lower back pain without known injuries or trauma. He states the pain is worse with certain movements. He was seen by his PCP last week who is helping him get follow up with a pain management clinic. He has taken hydrocodone without relief. He denies lower extremity numbness or tingling, bowel/bladder incontinence, gait problem.   Past Medical History:  Diagnosis Date  . Arthritis   . Bronchitis   . Hyperlipidemia   . Hypertension   . OSA (obstructive sleep apnea) 12/09/2011   PSG 01/03/12>>AHI 45.7, SpO2 low 73%, PLMI 0.  CPAP 15 cm H2O>>AHI 0, +R.   Marland Kitchen Pneumonia 2015  . Sleep apnea     Patient Active Problem List   Diagnosis Date Noted  . 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 OSA (obstructive sleep apnea) 12/09/2011  . Chronic bronchitis ([place]) 12/09/2011  . Tobacco abuse 12/09/2011    Past Surgical History:  Procedure Laterality Date  . BACK SURGERY    . JOINT REPLACEMENT    . REVISION TOTAL HIP ARTHROPLASTY Left 11/2015  . TOTAL HIP ARTHROPLASTY    . TOTAL HIP REVISION Left 11/18/2015   Procedure: LEFT TOTAL HIP REVISION;  Surgeon: Marybelle Killings, MD;  Location: [facility];  Service: Orthopedics;  Laterality: Left;       Home Medications    Prior to Admission medications   Medication Sig Start Date End Date Taking? Authorizing Provider    albuterol (PROVENTIL HFA;VENTOLIN HFA) 108 (90 BASE) MCG/ACT inhaler Inhale 1-2 puffs into the lungs every 6 (six) hours as needed for wheezing or shortness of breath. Patient not taking: Reported on 05/19/2014 04/17/14   Hyman Bible, PA-C  aspirin EC 325 MG EC tablet Take 1 tablet (325 mg total) by mouth daily with breakfast. 11/20/15   Lanae Crumbly, PA-C  cyclobenzaprine (FLEXERIL) 10 MG tablet Take 1 tablet (10 mg total) by mouth 2 (two) times daily as needed for muscle spasms. 04/07/16   Pattricia Boss, MD  doxycycline (VIBRAMYCIN) 100 MG capsule Take 1 capsule (100 mg total) by mouth 2 (two) times daily. 04/07/16   Pattricia Boss, MD  lisinopril (PRINIVIL,ZESTRIL) 20 MG tablet Take 20 mg by mouth daily. 04/23/15   Historical Provider, MD  meloxicam (MOBIC) 7.5 MG tablet Take 1 tablet (7.5 mg total) by mouth daily. 04/07/16   Pattricia Boss, MD  methocarbamol (ROBAXIN) 750 MG tablet Take 1 tablet (750 mg total) by mouth every 6 (six) hours as needed for muscle spasms. 11/20/15   Lanae Crumbly, PA-C  oxyCODONE-acetaminophen (PERCOCET) 10-325 MG tablet Take 1 tablet by mouth every 4 (four) hours as needed for pain. scheduled 11/20/15   Lanae Crumbly, PA-C    Family History Family History  Problem Relation Age of Onset  . Heart attack Father   . Cancer Mother  Social History Social History  Substance Use Topics  . Smoking status: Current Some Day Smoker    Packs/day: 0.50    Years: 39.00    Types: Cigarettes  . Smokeless tobacco: Never Used     Comment: states on and off smoker  . Alcohol use No     Allergies   Review of patient's allergies indicates no known allergies.   Review of Systems Review of Systems  Musculoskeletal: Positive for back pain. Negative for gait problem.  Neurological: Negative for weakness and numbness.     Physical Exam Updated Vital Signs BP 135/92 (BP Location: Left Arm)   Pulse 75   Temp 97.9 F (36.6 C) (Oral)   Resp 18   Ht 5\' 8"  (1.727 m)    Wt 245 lb (111.1 kg)   SpO2 97%   BMI 37.25 kg/m   Physical Exam  Constitutional: He is oriented to person, place, and time. He appears well-developed and well-nourished. No distress.  HENT:  Head: Normocephalic and atraumatic.  Eyes: Conjunctivae are normal. Right eye exhibits no discharge. Left eye exhibits no discharge. No scleral icterus.  Cardiovascular: Normal rate.   Pulmonary/Chest: Effort normal.  Musculoskeletal:  Tenderness to palpation of left lumbar paraspinal muscles. No midline spinal TTP. FROM of C, T, L spine. No step offs or obvious bony deformities. Negative SLR.    Neurological: He is alert and oriented to person, place, and time. Coordination normal.  Strength 5/5 throughout. No sensory deficits. No gait abnormality.   Skin: Skin is warm and dry. No rash noted. He is not diaphoretic. No erythema. No pallor.  Psychiatric: He has a normal mood and affect. His behavior is normal.  Nursing note and vitals reviewed.    ED Treatments / Results  DIAGNOSTIC STUDIES: Oxygen Saturation is 97% on RA, adequate by my interpretation.    COORDINATION OF CARE: 10:52 AM Discussed treatment plan with pt at bedside and pt agreed to plan.   Labs (all labs ordered are listed, but only abnormal results are displayed) Labs Reviewed - No data to display  EKG  EKG Interpretation None       Radiology No results found.  Procedures Procedures (including critical care time)  Medications Ordered in ED Medications - No data to display   Initial Impression / Assessment and Plan / ED Course  I have reviewed the triage vital signs and the nursing notes.  Pertinent labs & imaging results that were available during my care of the patient were reviewed by me and considered in my medical decision making (see chart for details).  Clinical Course    Patient with back pain, Likely chronic in etiology. Follow patient has been present for many years, he is requesting x-ray at  this time. X-ray was performed which is unchanged from previous. Postsurgical changes noted, alignment is preserved.  No neurological deficits and normal neuro exam.  Patient is ambulatory.  No loss of bowel or bladder control.  No concern for cauda equina.  No fever, night sweats, weight loss, h/o cancer, IVDA, no recent procedure to back. No urinary symptoms suggestive of UTI.  Discussed with patient that his chronic pain management by his primary care as well as pain management. Will not d/C with any pain meds at this time. Supportive care and return precaution discussed. Appears safe for discharge at this time. Follow up as indicated in discharge paperwork.    Final Clinical Impressions(s) / ED Diagnoses   Final diagnoses:  Chronic left-sided low  back pain without sciatica    New Prescriptions New Prescriptions   No medications on file  I personally performed the services described in this documentation, which was scribed in my presence. The recorded information has been reviewed and is accurate.      Dondra Spry [place], PA-C 05/02/16 [place], MD 05/11/16 [phone]

## 2016-05-02 NOTE — ED Triage Notes (Addendum)
Pt states L lower back pain x 2 weeks.  Pain increases with movement.  Denies changes in bowel or bladder habits, denies numbness/tingling.  States hx of 2  L hip replacements.  Pt is trying to get back into a pain clinic.

## 2016-05-02 NOTE — ED Triage Notes (Signed)
PT refuses ice and heat for lt sided back pain. Pt denies any recent injury.Marland Kitchen

## 2016-05-02 NOTE — Discharge Instructions (Signed)
Follow-up with your primary care doctor for reevaluation. Take home pain medications as needed. Return to the ED if you experience loss of control of bowels or bladder, numbness and tingling in both lower extremities, fever, weakness in an extremity.

## 2016-05-04 ENCOUNTER — Telehealth (INDEPENDENT_AMBULATORY_CARE_PROVIDER_SITE_OTHER): Payer: Self-pay | Admitting: Orthopaedic Surgery

## 2016-05-04 NOTE — Telephone Encounter (Signed)
Patient called this morning checking status of his request for his records to be faxed to Pain Management. He signed a release form 05/02/2016. I told him I the turn around time is generally 7-10 days, but I would try to get his faxed today or tomorrow.  I just faxed his records per his authorization release form [facility] records including OP notes to Pain Mgmt [phone]. Confirmation was received.

## 2016-05-12 ENCOUNTER — Other Ambulatory Visit: Payer: Self-pay | Admitting: Physician Assistant

## 2016-05-12 ENCOUNTER — Ambulatory Visit
Admission: RE | Admit: 2016-05-12 | Discharge: 2016-05-12 | Disposition: A | Discharge status: Home / Self Care | Payer: Medicare Other | Source: Ambulatory Visit | Attending: Physician Assistant | Admitting: Physician Assistant

## 2016-05-12 DIAGNOSIS — M25561 Pain in right knee: Secondary | ICD-10-CM

## 2016-05-12 DIAGNOSIS — M1990 Unspecified osteoarthritis, unspecified site: Secondary | ICD-10-CM

## 2016-05-25 ENCOUNTER — Encounter (HOSPITAL_COMMUNITY): Payer: Self-pay | Admitting: Emergency Medicine

## 2016-05-25 DIAGNOSIS — G8929 Other chronic pain: Secondary | ICD-10-CM | POA: Insufficient documentation

## 2016-05-25 DIAGNOSIS — M546 Pain in thoracic spine: Secondary | ICD-10-CM | POA: Insufficient documentation

## 2016-05-25 DIAGNOSIS — Z96642 Presence of left artificial hip joint: Secondary | ICD-10-CM | POA: Diagnosis not present

## 2016-05-25 DIAGNOSIS — R319 Hematuria, unspecified: Secondary | ICD-10-CM | POA: Diagnosis not present

## 2016-05-25 DIAGNOSIS — I10 Essential (primary) hypertension: Secondary | ICD-10-CM | POA: Diagnosis not present

## 2016-05-25 DIAGNOSIS — Z79899 Other long term (current) drug therapy: Secondary | ICD-10-CM | POA: Insufficient documentation

## 2016-05-25 DIAGNOSIS — Z7982 Long term (current) use of aspirin: Secondary | ICD-10-CM | POA: Diagnosis not present

## 2016-05-25 DIAGNOSIS — F17210 Nicotine dependence, cigarettes, uncomplicated: Secondary | ICD-10-CM | POA: Diagnosis not present

## 2016-05-25 DIAGNOSIS — M545 Low back pain: Secondary | ICD-10-CM | POA: Insufficient documentation

## 2016-05-25 LAB — URINE MICROSCOPIC-ADD ON: WBC, UA: NONE SEEN WBC/hpf (ref 0–5)

## 2016-05-25 LAB — URINALYSIS, ROUTINE W REFLEX MICROSCOPIC
BILIRUBIN URINE: NEGATIVE
Glucose, UA: NEGATIVE mg/dL
Ketones, ur: NEGATIVE mg/dL
Leukocytes, UA: NEGATIVE
Nitrite: NEGATIVE
PROTEIN: 30 mg/dL — AB
Specific Gravity, Urine: 1.022 (ref 1.005–1.030)
pH: 5.5 (ref 5.0–8.0)

## 2016-05-25 NOTE — ED Triage Notes (Signed)
Pt presents to the ED with left sided flank pain that started two weeks ago. Pt stated that he thought it would go away, but pain is the worst today. No radiation, or fever. Pt states there is a little sting when he urinates. Vitals stable

## 2016-05-26 ENCOUNTER — Emergency Department (HOSPITAL_COMMUNITY): Payer: Medicare Other

## 2016-05-26 ENCOUNTER — Emergency Department (HOSPITAL_COMMUNITY)
Admission: EM | Admit: 2016-05-26 | Discharge: 2016-05-26 | Disposition: A | Discharge status: Home / Self Care | Payer: Medicare Other | Attending: Emergency Medicine | Admitting: Emergency Medicine

## 2016-05-26 DIAGNOSIS — R319 Hematuria, unspecified: Secondary | ICD-10-CM

## 2016-05-26 DIAGNOSIS — G8929 Other chronic pain: Secondary | ICD-10-CM

## 2016-05-26 DIAGNOSIS — M549 Dorsalgia, unspecified: Secondary | ICD-10-CM

## 2016-05-26 MED ORDER — OXYCODONE-ACETAMINOPHEN 5-325 MG PO TABS
1.0000 | ORAL_TABLET | Freq: Once | ORAL | Status: AC
  Administered 2016-05-26: 1 via ORAL
  Filled 2016-05-26: qty 1

## 2016-05-26 NOTE — ED Notes (Signed)
PA at bedside.

## 2016-05-26 NOTE — ED Provider Notes (Signed)
[facility] DEPT Provider Note   CSN: [id number] Arrival date & time: 05/25/16  2244   History   Chief Complaint Chief Complaint  Patient presents with  . Flank Pain    HPI Peter Manning is a 60 y.o. male.  HPI   Patient with PMH of arthritis, bronchitis, hyperlipidemia, hypertension, OSA, pneumonia, chronic back pain, s/p revision of total hip on the left presents to the ER for evaluation and treatment of left sided thoracic and lumbar back pain. No urinary symptoms, abdominal symptoms of nausea or vomiting. Not flank pain. He reports sitting up and twisting exacerbated the pain. Laying down on his side improves the pain. He has been having worse problems every since he had a revision of his total hip on the left, he takes Hydrocodone at home for pain which has not been helping. No weakness, loss of bowel or bladder control, no night sweats or weight loss.  Past Medical History:  Diagnosis Date  . Arthritis   . Bronchitis   . Hyperlipidemia   . Hypertension   . OSA (obstructive sleep apnea) 12/09/2011   PSG 01/03/12>>AHI 45.7, SpO2 low 73%, PLMI 0.  CPAP 15 cm H2O>>AHI 0, +R.   Marland Kitchen Pneumonia 2015  . Sleep apnea     Patient Active Problem List   Diagnosis Date Noted  . 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 OSA (obstructive sleep apnea) 12/09/2011  . Chronic bronchitis ([place]) 12/09/2011  . Tobacco abuse 12/09/2011    Past Surgical History:  Procedure Laterality Date  . BACK SURGERY    . JOINT REPLACEMENT    . REVISION TOTAL HIP ARTHROPLASTY Left 11/2015  . TOTAL HIP ARTHROPLASTY    . TOTAL HIP REVISION Left 11/18/2015   Procedure: LEFT TOTAL HIP REVISION;  Surgeon: Marybelle Killings, MD;  Location: [facility];  Service: Orthopedics;  Laterality: Left;       Home Medications    Prior to Admission medications   Medication Sig Start Date End Date Taking? Authorizing Provider  albuterol (PROVENTIL HFA;VENTOLIN HFA) 108 (90 BASE) MCG/ACT inhaler Inhale 1-2 puffs into the lungs  every 6 (six) hours as needed for wheezing or shortness of breath. Patient not taking: Reported on 05/19/2014 04/17/14   Hyman Bible, PA-C  aspirin EC 325 MG EC tablet Take 1 tablet (325 mg total) by mouth daily with breakfast. 11/20/15   Lanae Crumbly, PA-C  cyclobenzaprine (FLEXERIL) 10 MG tablet Take 1 tablet (10 mg total) by mouth 2 (two) times daily as needed for muscle spasms. 04/07/16   Pattricia Boss, MD  doxycycline (VIBRAMYCIN) 100 MG capsule Take 1 capsule (100 mg total) by mouth 2 (two) times daily. 04/07/16   Pattricia Boss, MD  lisinopril (PRINIVIL,ZESTRIL) 20 MG tablet Take 20 mg by mouth daily. 04/23/15   Historical Provider, MD  meloxicam (MOBIC) 7.5 MG tablet Take 1 tablet (7.5 mg total) by mouth daily. 04/07/16   Pattricia Boss, MD  methocarbamol (ROBAXIN) 750 MG tablet Take 1 tablet (750 mg total) by mouth every 6 (six) hours as needed for muscle spasms. 11/20/15   Lanae Crumbly, PA-C  oxyCODONE-acetaminophen (PERCOCET) 10-325 MG tablet Take 1 tablet by mouth every 4 (four) hours as needed for pain. scheduled 11/20/15   Lanae Crumbly, PA-C    Family History Family History  Problem Relation Age of Onset  . Heart attack Father   . Cancer Mother     Social History Social History  Substance Use Topics  . Smoking status:  Current Some Day Smoker    Packs/day: 0.50    Years: 39.00    Types: Cigarettes  . Smokeless tobacco: Never Used     Comment: states on and off smoker  . Alcohol use No     Allergies   Patient has no known allergies.   Review of Systems Review of Systems  Review of Systems All other systems negative except as documented in the HPI. All pertinent positives and negatives as reviewed in the HPI.  Physical Exam Updated Vital Signs BP 147/96   Pulse 96   Temp 97.6 F (36.4 C) (Oral)   Ht 5\' 8"  (1.727 m)   Wt 111.1 kg   SpO2 100%   BMI 37.25 kg/m   Physical Exam  Constitutional: He appears well-developed and well-nourished. No distress.  HENT:    Head: Normocephalic and atraumatic.  Right Ear: Tympanic membrane and ear canal normal.  Left Ear: Tympanic membrane and ear canal normal.  Nose: Nose normal.  Mouth/Throat: Uvula is midline, oropharynx is clear and moist and mucous membranes are normal.  Eyes: Pupils are equal, round, and reactive to light.  Neck: Normal range of motion. Neck supple.  Cardiovascular: Normal rate and regular rhythm.   Pulmonary/Chest: Effort normal.  Abdominal: Soft.  No signs of abdominal distention  Musculoskeletal:       Thoracic back: He exhibits tenderness and pain. He exhibits normal range of motion, no bony tenderness, no swelling, no deformity, no laceration, no spasm and normal pulse.       Lumbar back: He exhibits decreased range of motion (causes pain), tenderness and pain. He exhibits no bony tenderness, no swelling, no edema, no deformity, no laceration, no spasm and normal pulse.  No LE swelling  Neurological: He is alert.  Acting at baseline  Skin: Skin is warm and dry. No rash noted.  Nursing note and vitals reviewed.    ED Treatments / Results  Labs (all labs ordered are listed, but only abnormal results are displayed) Labs Reviewed  URINALYSIS, ROUTINE W REFLEX MICROSCOPIC (NOT AT [facility]) - Abnormal; Notable for the following:       Result Value   Color, Urine AMBER (*)    Hgb urine dipstick MODERATE (*)    Protein, ur 30 (*)    All other components within normal limits  URINE MICROSCOPIC-ADD ON - Abnormal; Notable for the following:    Squamous Epithelial / LPF 0-5 (*)    Bacteria, UA RARE (*)    All other components within normal limits    EKG  EKG Interpretation None       Radiology Dg Thoracic Spine W/swimmers  Result Date: 05/26/2016 CLINICAL DATA:  Back pain.  No injury. EXAM: THORACIC SPINE - 3 VIEWS COMPARISON:  Two-view chest 11/05/2015 FINDINGS: Diffuse degenerative changes throughout the thoracic spine with narrowed interspaces and prominent osteophyte  formation at the endplates. Normal alignment. No vertebral compression deformities. No focal bone lesion or bone destruction. Bone cortex appears intact. No paraspinal soft tissue swelling. IMPRESSION: Degenerative changes in the thoracic spine. Normal alignment. No acute displaced fractures identified. Electronically Signed   By: Lucienne Capers M.D.   On: 05/26/2016 01:29   Dg Lumbar Spine Complete  Result Date: 05/26/2016 CLINICAL DATA:  Low back pain and mid thoracic pain tonight. No known injury. Back surgery in 2012. EXAM: LUMBAR SPINE - COMPLETE 4+ VIEW COMPARISON:  05/02/2016 FINDINGS: Postoperative changes with posterior laminectomies and posterior rod and screw fixation from L4-S1 with intervertebral disc  fusion at L4-5 and L5-S1. Hardware appears intact. No change in position of hardware or alignment of fused segments since previous study. Degenerative changes throughout the lumbar spine with narrowed interspaces and endplate hypertrophic changes. No acute vertebral compression deformities. Bone cortex appears intact. No focal bone lesion or bone destruction. IMPRESSION: Degenerative and postoperative changes in the lumbar spine. Postoperative fixation from L4 to the sacrum. No change since prior study. Electronically Signed   By: Lucienne Capers M.D.   On: 05/26/2016 01:28    Procedures Procedures (including critical care time)  Medications Ordered in ED Medications  oxyCODONE-acetaminophen (PERCOCET/ROXICET) 5-325 MG per tablet 1 tablet (1 tablet Oral Given 05/26/16 0155)     Initial Impression / Assessment and Plan / ED Course  I have reviewed the triage vital signs and the nursing notes.  Pertinent labs & imaging results that were available during my care of the patient were reviewed by me and considered in my medical decision making (see chart for details).  Clinical Course    xrays obtained her patients request----  60 y.o.[name]  with back pain.   No  neurological deficits and normal neuro exam. No loss of bowel or bladder control. No concern for cauda equina at this time base on HPI and physical exam findings. No fever, night sweats, weight loss, h/o cancer, IVDU. The patient can walk with some discomfort.   Patient Plan 1. Medications: NSAIDs and/or muscle relaxer. Cont usual home medications unless otherwise directed. 2. Treatment: rest, drink plenty of fluids, gentle stretching as discussed, alternate ice and heat  3. Follow Up: Please followup with your primary doctor for discussion of your diagnoses and further evaluation after today's visit; if you do not have a primary care doctor use the resource guide provided to find one  Advised to follow-up with the orthopedist if symptoms do not start to resolve in the next 2-3 days. If develop loss of bowel or urinary control return to the ED as soon as possible for further evaluation. To take the medications as prescribed as they can cause harm if not taken appropriately.   Vital signs are stable at discharge. Vitals:   05/25/16 2252  BP: 147/96  Pulse: 96  Temp: 97.6 F (36.4 C)    Patient/guardian has voiced understanding and agreed to follow-up with the PCP or specialist.   Final Clinical Impressions(s) / ED Diagnoses   Final diagnoses:  Exacerbation of chronic back pain  Hematuria, unspecified type    New Prescriptions New Prescriptions   No medications on file     Delos Haring, Hershal Coria 05/26/16 [id number]    Ezequiel Essex, MD 05/26/16 [phone]

## 2016-05-26 NOTE — ED Notes (Signed)
Pt states that he has pain radiating from side to back.

## 2016-05-26 NOTE — ED Notes (Signed)
Patient transported to X-ray 

## 2016-06-05 ENCOUNTER — Encounter (HOSPITAL_COMMUNITY): Payer: Self-pay | Admitting: *Deleted

## 2016-06-05 ENCOUNTER — Emergency Department (HOSPITAL_COMMUNITY)
Admission: EM | Admit: 2016-06-05 | Discharge: 2016-06-06 | Disposition: A | Discharge status: Home / Self Care | Payer: Medicare Other | Attending: Emergency Medicine | Admitting: Emergency Medicine

## 2016-06-05 DIAGNOSIS — R112 Nausea with vomiting, unspecified: Secondary | ICD-10-CM | POA: Insufficient documentation

## 2016-06-05 DIAGNOSIS — I10 Essential (primary) hypertension: Secondary | ICD-10-CM | POA: Insufficient documentation

## 2016-06-05 DIAGNOSIS — F17210 Nicotine dependence, cigarettes, uncomplicated: Secondary | ICD-10-CM | POA: Insufficient documentation

## 2016-06-05 DIAGNOSIS — M549 Dorsalgia, unspecified: Secondary | ICD-10-CM

## 2016-06-05 DIAGNOSIS — M545 Low back pain: Secondary | ICD-10-CM | POA: Diagnosis not present

## 2016-06-05 DIAGNOSIS — Z7982 Long term (current) use of aspirin: Secondary | ICD-10-CM | POA: Insufficient documentation

## 2016-06-05 DIAGNOSIS — R109 Unspecified abdominal pain: Secondary | ICD-10-CM | POA: Insufficient documentation

## 2016-06-05 DIAGNOSIS — Z96642 Presence of left artificial hip joint: Secondary | ICD-10-CM | POA: Diagnosis not present

## 2016-06-05 DIAGNOSIS — G8929 Other chronic pain: Secondary | ICD-10-CM | POA: Insufficient documentation

## 2016-06-05 LAB — CBC
HEMATOCRIT: 41.1 % (ref 39.0–52.0)
Hemoglobin: 14.3 g/dL (ref 13.0–17.0)
MCH: 32.6 pg (ref 26.0–34.0)
MCHC: 34.8 g/dL (ref 30.0–36.0)
MCV: 93.6 fL (ref 78.0–100.0)
PLATELETS: 275 10*3/uL (ref 150–400)
RBC: 4.39 MIL/uL (ref 4.22–5.81)
RDW: 12.3 % (ref 11.5–15.5)
WBC: 12.3 10*3/uL — AB (ref 4.0–10.5)

## 2016-06-05 LAB — URINALYSIS, ROUTINE W REFLEX MICROSCOPIC
Bilirubin Urine: NEGATIVE
Glucose, UA: NEGATIVE mg/dL
KETONES UR: NEGATIVE mg/dL
LEUKOCYTES UA: NEGATIVE
NITRITE: NEGATIVE
PH: 6 (ref 5.0–8.0)
PROTEIN: NEGATIVE mg/dL
Specific Gravity, Urine: 1.02 (ref 1.005–1.030)

## 2016-06-05 LAB — URINE MICROSCOPIC-ADD ON
BACTERIA UA: NONE SEEN
Squamous Epithelial / LPF: NONE SEEN

## 2016-06-05 NOTE — ED Triage Notes (Signed)
Pt to ED c/o back pain with emesis. Hx of back surgery in 2012. Pain started tonight then pt began vomiting. Pt has been seen in the past for same.

## 2016-06-06 ENCOUNTER — Emergency Department (HOSPITAL_COMMUNITY): Payer: Medicare Other

## 2016-06-06 DIAGNOSIS — R112 Nausea with vomiting, unspecified: Secondary | ICD-10-CM | POA: Diagnosis not present

## 2016-06-06 LAB — COMPREHENSIVE METABOLIC PANEL
ALT: 34 U/L (ref 17–63)
AST: 33 U/L (ref 15–41)
Albumin: 3.9 g/dL (ref 3.5–5.0)
Alkaline Phosphatase: 97 U/L (ref 38–126)
Anion gap: 8 (ref 5–15)
BILIRUBIN TOTAL: 0.7 mg/dL (ref 0.3–1.2)
BUN: 21 mg/dL — AB (ref 6–20)
CHLORIDE: 105 mmol/L (ref 101–111)
CO2: 25 mmol/L (ref 22–32)
Calcium: 9.7 mg/dL (ref 8.9–10.3)
Creatinine, Ser: 1.05 mg/dL (ref 0.61–1.24)
GFR calc Af Amer: >60 mL/min (ref >60)
GFR calc non Af Amer: >60 mL/min (ref >60)
Glucose, Bld: 142 mg/dL — ABNORMAL HIGH (ref 65–99)
POTASSIUM: 3.7 mmol/L (ref 3.5–5.1)
Sodium: 138 mmol/L (ref 135–145)
TOTAL PROTEIN: 7.1 g/dL (ref 6.5–8.1)

## 2016-06-06 LAB — LIPASE, BLOOD: Lipase: 19 U/L (ref 11–51)

## 2016-06-06 MED ORDER — SODIUM CHLORIDE 0.9 % IV BOLUS (SEPSIS)
1000.0000 mL | Freq: Once | INTRAVENOUS | Status: AC
Start: 2016-06-06 — End: 2016-06-06
  Administered 2016-06-06: 1000 mL via INTRAVENOUS

## 2016-06-06 MED ORDER — ONDANSETRON HCL 4 MG/2ML IJ SOLN
4.0000 mg | Freq: Once | INTRAMUSCULAR | Status: AC
  Administered 2016-06-06: 4 mg via INTRAVENOUS
  Filled 2016-06-06: qty 2

## 2016-06-06 MED ORDER — MORPHINE SULFATE (PF) 4 MG/ML IV SOLN
4.0000 mg | Freq: Once | INTRAVENOUS | Status: AC
  Administered 2016-06-06: 4 mg via INTRAVENOUS
  Filled 2016-06-06: qty 1

## 2016-06-06 MED ORDER — ONDANSETRON 8 MG PO TBDP: 8.0000 mg | ORAL_TABLET | Freq: Three times a day (TID) | ORAL | 0 refills | Status: DC | PRN

## 2016-06-06 NOTE — Discharge Instructions (Signed)
Take tylenol or motrin for pain. Take your muscle relaxant. Take zofran as prescribed as needed for vomiting. Follow up with your doctor for close recheck. Return if worsening.

## 2016-06-06 NOTE — ED Notes (Signed)
Patient transported to CT 

## 2016-06-06 NOTE — ED Notes (Signed)
Pt started vomiting this evening, last vomited at 9pm

## 2016-06-06 NOTE — ED Provider Notes (Signed)
[facility] DEPT Provider Note   CSN: [id number] Arrival date & time: 06/05/16  2231     History   Chief Complaint Chief Complaint  Patient presents with  . Back Pain  . Emesis    HPI Peter Manning is a 60 y.o. male.  HPI Peter Manning is a 60 y.o. male with history of chronic back pain, bronchitis, hypertension, hyperlipidemia, presents to emergency department complaining of left lower back pain and vomiting. Patient states his back pain has been there for several weeks. He states he has seen Dr. Lorin Mercy who is his surgeon, who told him "everything looks well." Patient admits to coming to the ER for the same back pain in the past. He states he currently is not taking any medications for it. Denies any trouble controlling his bladder or bowels. Denies any weakness or pain in extremities. No fever. States this afternoon he began vomiting. Unable to keep anything down. Also report some loose stools earlier today. Denies fever or chills. Denies any abdominal pain. No hematuria. No urinary symptoms. No treatment prior to coming in.  Past Medical History:  Diagnosis Date  . Arthritis   . Bronchitis   . Hyperlipidemia   . Hypertension   . OSA (obstructive sleep apnea) 12/09/2011   PSG 01/03/12>>AHI 45.7, SpO2 low 73%, PLMI 0.  CPAP 15 cm H2O>>AHI 0, +R.   Marland Kitchen Pneumonia 2015  . Sleep apnea     Patient Active Problem List   Diagnosis Date Noted  . 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 OSA (obstructive sleep apnea) 12/09/2011  . Chronic bronchitis ([place]) 12/09/2011  . Tobacco abuse 12/09/2011    Past Surgical History:  Procedure Laterality Date  . BACK SURGERY    . JOINT REPLACEMENT    . REVISION TOTAL HIP ARTHROPLASTY Left 11/2015  . TOTAL HIP ARTHROPLASTY    . TOTAL HIP REVISION Left 11/18/2015   Procedure: LEFT TOTAL HIP REVISION;  Surgeon: Marybelle Killings, MD;  Location: [facility];  Service: Orthopedics;  Laterality: Left;       Home Medications    Prior to Admission  medications   Medication Sig Start Date End Date Taking? Authorizing Provider  albuterol (PROVENTIL HFA;VENTOLIN HFA) 108 (90 BASE) MCG/ACT inhaler Inhale 1-2 puffs into the lungs every 6 (six) hours as needed for wheezing or shortness of breath. Patient not taking: Reported on 05/19/2014 04/17/14   Hyman Bible, PA-C  aspirin EC 325 MG EC tablet Take 1 tablet (325 mg total) by mouth daily with breakfast. 11/20/15   Lanae Crumbly, PA-C  cyclobenzaprine (FLEXERIL) 10 MG tablet Take 1 tablet (10 mg total) by mouth 2 (two) times daily as needed for muscle spasms. 04/07/16   Pattricia Boss, MD  doxycycline (VIBRAMYCIN) 100 MG capsule Take 1 capsule (100 mg total) by mouth 2 (two) times daily. 04/07/16   Pattricia Boss, MD  lisinopril (PRINIVIL,ZESTRIL) 20 MG tablet Take 20 mg by mouth daily. 04/23/15   Historical Provider, MD  meloxicam (MOBIC) 7.5 MG tablet Take 1 tablet (7.5 mg total) by mouth daily. 04/07/16   Pattricia Boss, MD  methocarbamol (ROBAXIN) 750 MG tablet Take 1 tablet (750 mg total) by mouth every 6 (six) hours as needed for muscle spasms. 11/20/15   Lanae Crumbly, PA-C  oxyCODONE-acetaminophen (PERCOCET) 10-325 MG tablet Take 1 tablet by mouth every 4 (four) hours as needed for pain. scheduled 11/20/15   Lanae Crumbly, PA-C    Family History Family History  Problem Relation Age  of Onset  . Heart attack Father   . Cancer Mother     Social History Social History  Substance Use Topics  . Smoking status: Current Some Day Smoker    Packs/day: 0.50    Years: 39.00    Types: Cigarettes  . Smokeless tobacco: Never Used     Comment: states on and off smoker  . Alcohol use No     Allergies   Patient has no known allergies.   Review of Systems Review of Systems  Constitutional: Negative for chills and fever.  Respiratory: Negative for cough, chest tightness and shortness of breath.   Cardiovascular: Negative for chest pain, palpitations and leg swelling.  Gastrointestinal: Positive  for nausea and vomiting. Negative for abdominal distention, abdominal pain and diarrhea.  Genitourinary: Positive for flank pain. Negative for dysuria, frequency, hematuria and urgency.  Musculoskeletal: Positive for back pain. Negative for arthralgias, myalgias, neck pain and neck stiffness.  Skin: Negative for rash.  Allergic/Immunologic: Negative for immunocompromised state.  Neurological: Negative for dizziness, weakness, light-headedness, numbness and headaches.  All other systems reviewed and are negative.    Physical Exam Updated Vital Signs BP 130/73 (BP Location: Left Arm)   Pulse 74   Temp 98.2 F (36.8 C) (Oral)   Resp 19   Ht 5\' 8"  (1.727 m)   Wt 112.6 kg   SpO2 98%   BMI 37.74 kg/m   Physical Exam  Constitutional: He is oriented to person, place, and time. He appears well-developed and well-nourished. No distress.  HENT:  Head: Normocephalic and atraumatic.  Eyes: Conjunctivae are normal.  Neck: Neck supple.  Cardiovascular: Normal rate, regular rhythm and normal heart sounds.   Pulmonary/Chest: Effort normal. No respiratory distress. He has no wheezes. He has no rales.  Abdominal: Soft. Bowel sounds are normal. He exhibits no distension. There is no tenderness. There is no rebound and no guarding.  Left CVA tenderness  Musculoskeletal: He exhibits no edema.  TTP over midline and left perispinal lumbar muscle tenderness  Neurological: He is alert and oriented to person, place, and time.  5/5 and equal lower extremity strength. 2+ and equal patellar reflexes bilaterally. Pt able to dorsiflex bilateral toes and feet with good strength against resistance. Equal sensation bilaterally over thighs and lower legs.   Skin: Skin is warm and dry.  Nursing note and vitals reviewed.    ED Treatments / Results  Labs (all labs ordered are listed, but only abnormal results are displayed) Labs Reviewed  COMPREHENSIVE METABOLIC PANEL - Abnormal; Notable for the following:         Result Value   Glucose, Bld 142 (*)    BUN 21 (*)    All other components within normal limits  CBC - Abnormal; Notable for the following:    WBC 12.3 (*)    All other components within normal limits  URINALYSIS, ROUTINE W REFLEX MICROSCOPIC (NOT AT [facility]) - Abnormal; Notable for the following:    Hgb urine dipstick SMALL (*)    All other components within normal limits  URINE MICROSCOPIC-ADD ON  LIPASE, BLOOD    EKG  EKG Interpretation None       Radiology Ct Renal Stone Study  Result Date: 06/06/2016 CLINICAL DATA:  LEFT flank pain and vomiting. EXAM: CT ABDOMEN AND PELVIS WITHOUT CONTRAST TECHNIQUE: Multidetector CT imaging of the abdomen and pelvis was performed following the standard protocol without IV contrast. COMPARISON:  CT abdomen and pelvis February 11, 2011 FINDINGS: LOWER CHEST: Lung  bases are clear. The visualized heart size is normal. No pericardial effusion. HEPATOBILIARY: Liver is normal.  Contracted gallbladder. PANCREAS: Normal. SPLEEN: Normal. ADRENALS/URINARY TRACT: Kidneys are orthotopic, demonstrating normal size and morphology. Two LEFT lower pole nephrolithiasis measuring up to 3 mm. No hydronephrosis; limited assessment for renal masses on this nonenhanced examination. Too small to characterize hypodensities in the kidneys bilaterally. 4.7 cm LEFT interpolar cyst is increased in size. The unopacified ureters are normal in course and caliber. Urinary bladder is partially distended and unremarkable. Normal adrenal glands. STOMACH/BOWEL: The stomach, small and large bowel are normal in course and caliber without inflammatory changes, sensitivity decreased by lack of enteric contrast. Mild colonic diverticulosis. Mild amount of retained large bowel stool. Normal appendix. VASCULAR/LYMPHATIC: Aortoiliac vessels are normal in course and caliber, trace calcific atherosclerosis. No lymphadenopathy by CT size criteria. Small retroperitoneal lymph nodes are likely  reactive. Prostate is mildly enlarged.  Normal. OTHER: No intraperitoneal free fluid or free air. MUSCULOSKELETAL: Non-acute. Streak artifact from LEFT hip total arthroplasty. Loose bodies versus heterotopic ossification of LEFT iliacus muscle. Asymmetrically atrophic LEFT iliopsoas muscle. Moderate RIGHT hip osteoarthrosis. Small fat containing LEFT inguinal hernia and umbilical hernia. Sacroiliac ankylosis. Status post L4-5 and L5-S1 PLIF with arthrodesis. IMPRESSION: Nonobstructing LEFT nephrolithiasis measure up to 3 mm. No acute intra-abdominal or pelvic process. Electronically Signed   By: Elon Alas M.D.   On: 06/06/2016 01:21    Procedures Procedures (including critical care time)  Medications Ordered in ED Medications  morphine 4 MG/ML injection 4 mg (not administered)  ondansetron (ZOFRAN) injection 4 mg (not administered)  sodium chloride 0.9 % bolus 1,000 mL (not administered)     Initial Impression / Assessment and Plan / ED Course  I have reviewed the triage vital signs and the nursing notes.  Pertinent labs & imaging results that were available during my care of the patient were reviewed by me and considered in my medical decision making (see chart for details).  Clinical Course    Patient with acute on chronic left-sided back pain, nausea, vomiting. Abdomen is benign with no abdominal tenderness. Patient does have left CVA tenderness. He is actively vomiting during my exam. Will check labs, start IV fluids, pain medications, antiemetics, will get CT of renal to rule out kidney stone.  1:51 AM Patient feels better with Zofran and morphine IV. He no longer has any nausea or vomiting. His CT scan showed nonobstructing left nephrolithiasis otherwise no acute intra-abdominal or pelvic process. CT scan also showed loose bodies versus heterotrophic ossification of the left iliacus muscle, question if that could be responsible for patient's pain. Patient states he has muscle  relaxants at home that he will take. Instructed to take Tylenol or Motrin for pain. Will discharge home with antiemetics. Instructed to follow-up closely with his doctor, return precautions discussed.  Vitals:   06/05/16 2235 06/06/16 0039 06/06/16 0100  BP: 130/73 131/66 104/57  Pulse: 74 67   Resp: 19 18 20   Temp: 98.2 F (36.8 C)    TempSrc: Oral    SpO2: 98% 97%   Weight: 112.6 kg    Height: 5\' 8"  (1.727 m)        Final Clinical Impressions(s) / ED Diagnoses   Final diagnoses:  Chronic left-sided back pain, unspecified back location  Non-intractable vomiting with nausea, unspecified vomiting type    New Prescriptions New Prescriptions   ONDANSETRON (ZOFRAN ODT) 8 MG DISINTEGRATING TABLET    Take 1 tablet (8 mg total) by mouth  every 8 (eight) hours as needed for nausea or vomiting.     Jeannett Senior, PA-C 06/06/16 [id number]    Merryl Hacker, MD 06/06/16 [phone]

## 2016-07-06 ENCOUNTER — Encounter (HOSPITAL_COMMUNITY): Payer: Self-pay | Admitting: Emergency Medicine

## 2016-07-06 ENCOUNTER — Emergency Department (HOSPITAL_COMMUNITY)
Admission: EM | Admit: 2016-07-06 | Discharge: 2016-07-06 | Disposition: A | Discharge status: Home / Self Care | Payer: Medicare Other | Attending: Emergency Medicine | Admitting: Emergency Medicine

## 2016-07-06 DIAGNOSIS — Z5321 Procedure and treatment not carried out due to patient leaving prior to being seen by health care provider: Secondary | ICD-10-CM | POA: Diagnosis not present

## 2016-07-06 DIAGNOSIS — R05 Cough: Secondary | ICD-10-CM | POA: Diagnosis not present

## 2016-07-06 DIAGNOSIS — R059 Cough, unspecified: Secondary | ICD-10-CM

## 2016-07-06 LAB — URINALYSIS, ROUTINE W REFLEX MICROSCOPIC
BACTERIA UA: NONE SEEN
BILIRUBIN URINE: NEGATIVE
Glucose, UA: NEGATIVE mg/dL
Ketones, ur: NEGATIVE mg/dL
LEUKOCYTES UA: NEGATIVE
Nitrite: NEGATIVE
PROTEIN: NEGATIVE mg/dL
SPECIFIC GRAVITY, URINE: 1.017 (ref 1.005–1.030)
SQUAMOUS EPITHELIAL / LPF: NONE SEEN
pH: 5 (ref 5.0–8.0)

## 2016-07-06 LAB — COMPREHENSIVE METABOLIC PANEL
ALBUMIN: 4.1 g/dL (ref 3.5–5.0)
ALT: 23 U/L (ref 17–63)
AST: 23 U/L (ref 15–41)
Alkaline Phosphatase: 97 U/L (ref 38–126)
Anion gap: 9 (ref 5–15)
BUN: 18 mg/dL (ref 6–20)
CALCIUM: 10 mg/dL (ref 8.9–10.3)
CHLORIDE: 104 mmol/L (ref 101–111)
CO2: 26 mmol/L (ref 22–32)
CREATININE: 1.07 mg/dL (ref 0.61–1.24)
GFR calc non Af Amer: >60 mL/min (ref >60)
GLUCOSE: 142 mg/dL — AB (ref 65–99)
Potassium: 3.5 mmol/L (ref 3.5–5.1)
SODIUM: 139 mmol/L (ref 135–145)
Total Bilirubin: 0.8 mg/dL (ref 0.3–1.2)
Total Protein: 7.4 g/dL (ref 6.5–8.1)

## 2016-07-06 LAB — CBC WITH DIFFERENTIAL/PLATELET
BASOS ABS: 0 10*3/uL (ref 0.0–0.1)
BASOS PCT: 0 %
EOS ABS: 0.1 10*3/uL (ref 0.0–0.7)
Eosinophils Relative: 0 %
HCT: 41.2 % (ref 39.0–52.0)
HEMOGLOBIN: 14.3 g/dL (ref 13.0–17.0)
LYMPHS ABS: 1.9 10*3/uL (ref 0.7–4.0)
Lymphocytes Relative: 14 %
MCH: 32.6 pg (ref 26.0–34.0)
MCHC: 34.7 g/dL (ref 30.0–36.0)
MCV: 93.8 fL (ref 78.0–100.0)
Monocytes Absolute: 0.7 10*3/uL (ref 0.1–1.0)
Monocytes Relative: 5 %
NEUTROS PCT: 81 %
Neutro Abs: 10.4 10*3/uL — ABNORMAL HIGH (ref 1.7–7.7)
Platelets: 273 10*3/uL (ref 150–400)
RBC: 4.39 MIL/uL (ref 4.22–5.81)
RDW: 12.7 % (ref 11.5–15.5)
WBC: 13 10*3/uL — AB (ref 4.0–10.5)

## 2016-07-06 NOTE — ED Notes (Signed)
Placed pt, in room, unable to locate pt.  Pt. Is not in the ED waiting area

## 2016-07-06 NOTE — ED Triage Notes (Signed)
Patient reports left lower flank pain with productive cough onset this week , denies hematuria or fever.

## 2016-07-13 ENCOUNTER — Ambulatory Visit (INDEPENDENT_AMBULATORY_CARE_PROVIDER_SITE_OTHER): Payer: Self-pay | Admitting: Orthopaedic Surgery

## 2016-08-16 ENCOUNTER — Emergency Department (HOSPITAL_COMMUNITY)
Admission: EM | Admit: 2016-08-16 | Discharge: 2016-08-16 | Disposition: A | Discharge status: Home / Self Care | Payer: Medicare Other | Attending: Emergency Medicine | Admitting: Emergency Medicine

## 2016-08-16 DIAGNOSIS — N410 Acute prostatitis: Secondary | ICD-10-CM | POA: Insufficient documentation

## 2016-08-16 DIAGNOSIS — Z96642 Presence of left artificial hip joint: Secondary | ICD-10-CM | POA: Diagnosis not present

## 2016-08-16 DIAGNOSIS — R109 Unspecified abdominal pain: Secondary | ICD-10-CM | POA: Diagnosis present

## 2016-08-16 DIAGNOSIS — N419 Inflammatory disease of prostate, unspecified: Secondary | ICD-10-CM

## 2016-08-16 DIAGNOSIS — I10 Essential (primary) hypertension: Secondary | ICD-10-CM | POA: Diagnosis not present

## 2016-08-16 DIAGNOSIS — Z7982 Long term (current) use of aspirin: Secondary | ICD-10-CM | POA: Diagnosis not present

## 2016-08-16 DIAGNOSIS — F17210 Nicotine dependence, cigarettes, uncomplicated: Secondary | ICD-10-CM | POA: Diagnosis not present

## 2016-08-16 DIAGNOSIS — Z79899 Other long term (current) drug therapy: Secondary | ICD-10-CM | POA: Diagnosis not present

## 2016-08-16 LAB — CBC WITH DIFFERENTIAL/PLATELET
BASOS ABS: 0 10*3/uL (ref 0.0–0.1)
Basophils Relative: 0 %
EOS PCT: 1 %
Eosinophils Absolute: 0.1 10*3/uL (ref 0.0–0.7)
HCT: 44.3 % (ref 39.0–52.0)
HEMOGLOBIN: 14.8 g/dL (ref 13.0–17.0)
Lymphocytes Relative: 21 %
Lymphs Abs: 1.6 10*3/uL (ref 0.7–4.0)
MCH: 32.3 pg (ref 26.0–34.0)
MCHC: 33.4 g/dL (ref 30.0–36.0)
MCV: 96.7 fL (ref 78.0–100.0)
Monocytes Absolute: 0.4 10*3/uL (ref 0.1–1.0)
Monocytes Relative: 5 %
NEUTROS PCT: 73 %
Neutro Abs: 5.4 10*3/uL (ref 1.7–7.7)
PLATELETS: 253 10*3/uL (ref 150–400)
RBC: 4.58 MIL/uL (ref 4.22–5.81)
RDW: 13.1 % (ref 11.5–15.5)
WBC: 7.6 10*3/uL (ref 4.0–10.5)

## 2016-08-16 LAB — I-STAT CHEM 8, ED
BUN: 13 mg/dL (ref 6–20)
CALCIUM ION: 1.26 mmol/L (ref 1.15–1.40)
CHLORIDE: 103 mmol/L (ref 101–111)
Creatinine, Ser: 0.9 mg/dL (ref 0.61–1.24)
GLUCOSE: 125 mg/dL — AB (ref 65–99)
HCT: 46 % (ref 39.0–52.0)
Hemoglobin: 15.6 g/dL (ref 13.0–17.0)
POTASSIUM: 3.3 mmol/L — AB (ref 3.5–5.1)
SODIUM: 142 mmol/L (ref 135–145)
TCO2: 28 mmol/L (ref 0–100)

## 2016-08-16 LAB — URINALYSIS, ROUTINE W REFLEX MICROSCOPIC
GLUCOSE, UA: 100 mg/dL — AB
Ketones, ur: 15 mg/dL — AB
NITRITE: POSITIVE — AB
PH: 6.5 (ref 5.0–8.0)
Protein, ur: >300 mg/dL — AB
SPECIFIC GRAVITY, URINE: 1.025 (ref 1.005–1.030)

## 2016-08-16 LAB — URINALYSIS, MICROSCOPIC (REFLEX): Squamous Epithelial / LPF: NONE SEEN

## 2016-08-16 MED ORDER — CIPROFLOXACIN HCL 500 MG PO TABS: 500.0000 mg | ORAL_TABLET | Freq: Two times a day (BID) | ORAL | 0 refills | Status: AC

## 2016-08-16 MED ORDER — POTASSIUM CHLORIDE CRYS ER 20 MEQ PO TBCR: 40.0000 meq | EXTENDED_RELEASE_TABLET | Freq: Once | ORAL | Status: DC

## 2016-08-16 MED ORDER — POTASSIUM CHLORIDE CRYS ER 20 MEQ PO TBCR
40.0000 meq | EXTENDED_RELEASE_TABLET | Freq: Once | ORAL | Status: AC
  Administered 2016-08-16: 40 meq via ORAL
  Filled 2016-08-16: qty 2

## 2016-08-16 NOTE — ED Triage Notes (Signed)
Patient here with ongoing hematuria. States that he has been seen for same and had CT that his doctor has never given him the results. Today states some pain with bleeding, NAD

## 2016-08-16 NOTE — ED Provider Notes (Signed)
comPlains of pain was hematuria for one month. Denies fever no other associated symptoms. Treated with Cipro, without relief. I advised patient that needs follow-up with urology to check for cancer.   Orlie Dakin, MD 08/17/16 [phone]

## 2016-08-16 NOTE — ED Notes (Signed)
Pt stable, understands discharge instructions, and reasons for return.   

## 2016-08-16 NOTE — ED Provider Notes (Signed)
[facility] DEPT Provider Note   CSN: [id number] Arrival date & time: 08/16/16  1143     History   Chief Complaint No chief complaint on file.   HPI Peter Manning is a 61 y.o. male.  HPI Presents with complaints of hematuria. States that symptoms started approximately 1 month ago and he saw his primary care provider for such. He states that he was also having some blood when he wiped his rectum and some pain when he went to the bathroom on his rectum. No rectal discharge otherwise, no penile discharge. He states the pain is worse when he urinates and stools. There are no clots. He is not on anticoagulation. Patient states he is now on Cipro and has been taking it twice a day for about a week. However, since his symptoms were not getting better he decided to come to the ER. He has not passed out, has not had any vomiting, has no dominant pain, no fevers. Does have some bilateral flank pain. He does feel like he can urinate fully. He is not vomiting blood. No black stools. Patient states the symptoms are moderate.  Past Medical History:  Diagnosis Date  . Arthritis   . Bronchitis   . Hyperlipidemia   . Hypertension   . OSA (obstructive sleep apnea) 12/09/2011   PSG 01/03/12>>AHI 45.7, SpO2 low 73%, PLMI 0.  CPAP 15 cm H2O>>AHI 0, +R.   Marland Kitchen Pneumonia 2015  . Sleep apnea     Patient Active Problem List   Diagnosis Date Noted  . 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 OSA (obstructive sleep apnea) 12/09/2011  . Chronic bronchitis ([place]) 12/09/2011  . Tobacco abuse 12/09/2011    Past Surgical History:  Procedure Laterality Date  . BACK SURGERY    . JOINT REPLACEMENT    . REVISION TOTAL HIP ARTHROPLASTY Left 11/2015  . TOTAL HIP ARTHROPLASTY    . TOTAL HIP REVISION Left 11/18/2015   Procedure: LEFT TOTAL HIP REVISION;  Surgeon: Marybelle Killings, MD;  Location: [facility];  Service: Orthopedics;  Laterality: Left;       Home Medications    Prior to Admission medications   Medication  Sig Start Date End Date Taking? Authorizing Provider  albuterol (PROVENTIL HFA;VENTOLIN HFA) 108 (90 BASE) MCG/ACT inhaler Inhale 1-2 puffs into the lungs every 6 (six) hours as needed for wheezing or shortness of breath. Patient not taking: Reported on 06/06/2016 04/17/14   Hyman Bible, PA-C  aspirin EC 325 MG EC tablet Take 1 tablet (325 mg total) by mouth daily with breakfast. Patient not taking: Reported on 06/06/2016 11/20/15   Lanae Crumbly, PA-C  ciprofloxacin (CIPRO) 500 MG tablet Take 1 tablet (500 mg total) by mouth every 12 (twelve) hours. 08/16/16 09/13/16  Karma Greaser, MD  cyclobenzaprine (FLEXERIL) 10 MG tablet Take 1 tablet (10 mg total) by mouth 2 (two) times daily as needed for muscle spasms. Patient not taking: Reported on 06/06/2016 04/07/16   Pattricia Boss, MD  doxycycline (VIBRAMYCIN) 100 MG capsule Take 1 capsule (100 mg total) by mouth 2 (two) times daily. Patient not taking: Reported on 06/06/2016 04/07/16   Pattricia Boss, MD  lisinopril (PRINIVIL,ZESTRIL) 20 MG tablet Take 20 mg by mouth daily. 04/23/15   Historical Provider, MD  meloxicam (MOBIC) 7.5 MG tablet Take 1 tablet (7.5 mg total) by mouth daily. Patient not taking: Reported on 06/06/2016 04/07/16   Pattricia Boss, MD  methocarbamol (ROBAXIN) 750 MG tablet Take 1 tablet (750 mg  total) by mouth every 6 (six) hours as needed for muscle spasms. Patient not taking: Reported on 06/06/2016 11/20/15   Lanae Crumbly, PA-C  ondansetron (ZOFRAN ODT) 8 MG disintegrating tablet Take 1 tablet (8 mg total) by mouth every 8 (eight) hours as needed for nausea or vomiting. 06/06/16   Tatyana Kirichenko, PA-C  oxyCODONE-acetaminophen (PERCOCET) 10-325 MG tablet Take 1 tablet by mouth every 4 (four) hours as needed for pain. scheduled Patient not taking: Reported on 06/06/2016 11/20/15   Lanae Crumbly, PA-C    Family History Family History  Problem Relation Age of Onset  . Heart attack Father   . Cancer Mother     Social  History Social History  Substance Use Topics  . Smoking status: Current Some Day Smoker    Packs/day: 0.50    Years: 39.00    Types: Cigarettes  . Smokeless tobacco: Never Used     Comment: states on and off smoker  . Alcohol use No     Allergies   Patient has no known allergies.   Review of Systems Review of Systems  Constitutional: Negative for fever.  Allergic/Immunologic: Negative for immunocompromised state.  All other systems reviewed and are negative.    Physical Exam Updated Vital Signs BP 118/69 (BP Location: Right Arm)   Pulse (!) 57   Temp 97.9 F (36.6 C) (Oral)   Resp 20   SpO2 96%   Physical Exam  Constitutional: He appears well-developed and well-nourished.  HENT:  Head: Normocephalic and atraumatic.  Eyes: Conjunctivae are normal.  Neck: Neck supple.  Cardiovascular: Normal rate and regular rhythm.   No murmur heard. Pulmonary/Chest: Effort normal and breath sounds normal. No respiratory distress.  Abdominal: Soft. He exhibits no distension and no mass. There is no tenderness. There is no rebound and no guarding. No hernia.  Diffuse tenderness along back; indeterminate for CVAT  Genitourinary: Penis normal.  Genitourinary Comments: Rectal exam limited as patient has large body habitus and I'm unable to fully palpate the prostate but he does have some mild tenderness. No rectal blood noted but poor stool sample. No hemorrhoids noted. Urethra does not have any lesions, discharge.  Musculoskeletal: He exhibits no edema.  Neurological: He is alert.  Skin: Skin is warm and dry.  Psychiatric: He has a normal mood and affect.  Nursing note and vitals reviewed.    ED Treatments / Results  Labs (all labs ordered are listed, but only abnormal results are displayed) Labs Reviewed  URINALYSIS, ROUTINE W REFLEX MICROSCOPIC - Abnormal; Notable for the following:       Result Value   Color, Urine BROWN (*)    APPearance TURBID (*)    Glucose, UA 100  (*)    Hgb urine dipstick LARGE (*)    Bilirubin Urine MODERATE (*)    Ketones, ur 15 (*)    Protein, ur >300 (*)    Nitrite POSITIVE (*)    Leukocytes, UA MODERATE (*)    All other components within normal limits  URINALYSIS, MICROSCOPIC (REFLEX) - Abnormal; Notable for the following:    Bacteria, UA RARE (*)    All other components within normal limits  I-STAT CHEM 8, ED - Abnormal; Notable for the following:    Potassium 3.3 (*)    Glucose, Bld 125 (*)    All other components within normal limits  URINE CULTURE  CBC WITH DIFFERENTIAL/PLATELET    EKG  EKG Interpretation None       Radiology  No results found.  Procedures Procedures (including critical care time)  Medications Ordered in ED Medications  potassium chloride SA (K-DUR,KLOR-CON) CR tablet 40 mEq (40 mEq Oral Given 08/16/16 1643)     Initial Impression / Assessment and Plan / ED Course  I have reviewed the triage vital signs and the nursing notes.  Pertinent labs & imaging results that were available during my care of the patient were reviewed by me and considered in my medical decision making (see chart for details).     K replaced. Labs wo AKI. UA with UTI. Ucx pending. Bladder scan with <50cc. Recent CT scan with left nephrolithiasis in December - Symptoms are not consistent with urolithiasis He does endorse pain with defecation, although improved since antibiotic initiation Cipro prolongued for a total of 6 weeks given prostatitis FU urology to rule out underlying malignancy Patient discharged in good condition with strict return precautions  Final Clinical Impressions(s) / ED Diagnoses   Final diagnoses:  Prostatitis, unspecified prostatitis type    New Prescriptions Discharge Medication List as of 08/16/2016  6:02 PM       Karma Greaser, MD 08/16/16 [place], MD 08/17/16 [id number]

## 2016-08-17 LAB — URINE CULTURE: Culture: NO GROWTH

## 2016-08-22 ENCOUNTER — Ambulatory Visit (INDEPENDENT_AMBULATORY_CARE_PROVIDER_SITE_OTHER): Payer: Medicare Other | Admitting: Podiatry

## 2016-08-22 ENCOUNTER — Encounter: Payer: Self-pay | Admitting: Podiatry

## 2016-08-22 DIAGNOSIS — L03039 Cellulitis of unspecified toe: Secondary | ICD-10-CM | POA: Diagnosis not present

## 2016-08-22 DIAGNOSIS — M79674 Pain in right toe(s): Secondary | ICD-10-CM

## 2016-08-22 MED ORDER — DOXYCYCLINE HYCLATE 100 MG PO TABS: 100.0000 mg | ORAL_TABLET | Freq: Two times a day (BID) | ORAL | 0 refills | Status: DC

## 2016-08-22 NOTE — Progress Notes (Signed)
   Subjective: 61 year old male presents the office today for right great toe pain. Patient states that he is been painful for approximately 2-3 weeks now. Patient denies trauma. Patient presents today for further treatment and evaluation   Objective/Physical Exam General: The patient is alert and oriented x3 in no acute distress.  Dermatology: Partial detachment with loosening of the right great toenail. Upon palpation and compression of the toenail there is purulent drainage which is expressed from underneath the nail plate. Skin is warm, dry and supple bilateral lower extremities. Negative for open lesions or macerations.  Vascular: Palpable pedal pulses bilaterally. No edema or erythema noted. Capillary refill within normal limits.  Neurological: Epicritic and protective threshold grossly intact bilaterally.   Musculoskeletal Exam: Range of motion within normal limits to all pedal and ankle joints bilateral. Muscle strength 5/5 in all groups bilateral.    Assessment: #1 partially detached right great toenail with underlying purulent drainage  Plan of Care:  #1 Patient was evaluated. #2 today were going to perform a total temporary nail avulsion of the right great toe. Prior to avulsion, local anesthesia infiltration was utilized and the hallux block fashion consisting of a 50-50 mixture of 2% lidocaine plain with 0.5% Marcaine plain. The right great toenail was removed in toto and dry sterile dressing applied. #3 prescription for doxycycline 100 mg #4 return to clinic in 2 weeks  Next visit patient wants a total temporary nail avulsion performed to the left great toe due to painful toenail   Edrick Kins, DPM [facility]  Dr. Edrick Kins, [address]                                        [place], [place] [address]                Office [phone]  Fax [phone]

## 2016-09-05 ENCOUNTER — Emergency Department (HOSPITAL_COMMUNITY): Payer: Medicare Other

## 2016-09-05 ENCOUNTER — Ambulatory Visit: Payer: Medicare Other | Admitting: Podiatry

## 2016-09-05 ENCOUNTER — Encounter (HOSPITAL_COMMUNITY): Payer: Self-pay

## 2016-09-05 ENCOUNTER — Emergency Department (HOSPITAL_COMMUNITY)
Admission: EM | Admit: 2016-09-05 | Discharge: 2016-09-06 | Disposition: A | Discharge status: Home / Self Care | Payer: Medicare Other | Attending: Emergency Medicine | Admitting: Emergency Medicine

## 2016-09-05 DIAGNOSIS — F17210 Nicotine dependence, cigarettes, uncomplicated: Secondary | ICD-10-CM | POA: Diagnosis not present

## 2016-09-05 DIAGNOSIS — M25461 Effusion, right knee: Secondary | ICD-10-CM | POA: Insufficient documentation

## 2016-09-05 DIAGNOSIS — Z96642 Presence of left artificial hip joint: Secondary | ICD-10-CM | POA: Diagnosis not present

## 2016-09-05 DIAGNOSIS — M25561 Pain in right knee: Secondary | ICD-10-CM | POA: Diagnosis present

## 2016-09-05 DIAGNOSIS — Z7982 Long term (current) use of aspirin: Secondary | ICD-10-CM | POA: Diagnosis not present

## 2016-09-05 DIAGNOSIS — I10 Essential (primary) hypertension: Secondary | ICD-10-CM | POA: Diagnosis not present

## 2016-09-05 DIAGNOSIS — Z79899 Other long term (current) drug therapy: Secondary | ICD-10-CM | POA: Insufficient documentation

## 2016-09-05 MED ORDER — LIDOCAINE HCL (PF) 1 % IJ SOLN
30.0000 mL | Freq: Once | INTRAMUSCULAR | Status: AC
  Administered 2016-09-05: 30 mL via INTRADERMAL
  Filled 2016-09-05: qty 30

## 2016-09-05 NOTE — ED Notes (Signed)
Patient transported to X-ray 

## 2016-09-05 NOTE — ED Notes (Signed)
Pt back from Xray. No distress observed.

## 2016-09-05 NOTE — ED Notes (Signed)
Consent for knee aspiration signed and in room.

## 2016-09-05 NOTE — ED Notes (Signed)
PA at bedside.

## 2016-09-05 NOTE — ED Triage Notes (Signed)
Pt complaining of swelling to R knee. Pt states hx of same. Pt requesting to have fluid taken off knee. Pt states unable to walk. Pt states painful to bend.

## 2016-09-06 DIAGNOSIS — M25461 Effusion, right knee: Secondary | ICD-10-CM | POA: Diagnosis not present

## 2016-09-06 LAB — GRAM STAIN

## 2016-09-06 LAB — SYNOVIAL CELL COUNT + DIFF, W/ CRYSTALS
Crystals, Fluid: NONE SEEN
Lymphocytes-Synovial Fld: 6 % (ref 0–20)
Monocyte-Macrophage-Synovial Fluid: 94 % — ABNORMAL HIGH (ref 50–90)
Neutrophil, Synovial: 0 % (ref 0–25)
WBC, Synovial: 29 /mm3 (ref 0–200)

## 2016-09-06 NOTE — ED Notes (Signed)
PA at bedside.

## 2016-09-06 NOTE — Discharge Instructions (Addendum)
1. Medications: usual home medications, including pain control 2. Treatment: rest, drink plenty of fluids, elevate knee, use brace and ice 3. Follow Up: Please followup with your primary doctor in 3-5 days for discussion of your diagnoses and further evaluation after today's visit; if you do not have a primary care doctor use the resource guide provided to find one; Please return to the ER for fever, chills, redness, worsening pain or other concerns

## 2016-09-06 NOTE — ED Provider Notes (Signed)
Care assumed from [facility], [place].  Peter Manning is a 61 y.o. male presents with 3 days of right knee swelling and pain with increased difficulty bearing weight. Patient denies fevers or chills, trauma.  Initial provider aspirated clear serous fluid from the joint. No signs of septic joint on initial clinical exam.  Physical Exam  BP 134/74 (BP Location: Left Arm)   Pulse 76   Temp 98.4 F (36.9 C) (Oral)   Resp 18   SpO2 94%   Physical Exam  Constitutional: He appears well-developed and well-nourished. No distress.  HENT:  Head: Normocephalic.  Eyes: Conjunctivae are normal. No scleral icterus.  Neck: Normal range of motion.  Cardiovascular: Normal rate and intact distal pulses.   Pulmonary/Chest: Effort normal.  Musculoskeletal: Normal range of motion.  Neurological: He is alert.  Skin: Skin is warm and dry.     Results for orders placed or performed during the hospital encounter of 09/05/16  Gram stain  Result Value Ref Range   Specimen Description SYNOVIAL    Special Requests RIGHT KNEE    Gram Stain      CYTOSPIN SMEAR WBC PRESENT, PREDOMINANTLY MONONUCLEAR NO ORGANISMS SEEN    Report Status 09/06/2016 FINAL   Cell count + diff,  w/ cryst-synvl fld  Result Value Ref Range   Color, Synovial YELLOW YELLOW   Appearance-Synovial CLEAR (A) CLEAR   Crystals, Fluid NO CRYSTALS SEEN    WBC, Synovial 29 0 - 200 /cu mm   Neutrophil, Synovial 0 0 - 25 %   Lymphocytes-Synovial Fld 6 0 - 20 %   Monocyte-Macrophage-Synovial Fluid 94 (H) 50 - 90 %   Dg Knee Complete 4 Views Right  Result Date: 09/05/2016 CLINICAL DATA:  Chronic right knee pain and swelling. EXAM: RIGHT KNEE - COMPLETE 4+ VIEW COMPARISON:  05/12/2016 FINDINGS: No fracture or other acute bony abnormality. Severe osteoarthritic changes involve all compartments. Mild genu valgus. Moderate joint effusion. No bone lesion or bony destruction. IMPRESSION: Severe osteoarthritis.  Moderate joint effusion. Electronically  Signed   By: Andreas Newport M.D.   On: 09/05/2016 23:42     ED Course  Procedures      MDM Patient with right knee effusion. X-ray was without trauma but did note osteoarthritis and joint effusion. Fluid aspirated by initial provider is clear and without elevation in white blood cells. No crystals noted. No organisms seen on Gram stain. Patient will be discharged home to follow-up with primary care and orthopedics. Discussed reasons to return to the emergency department including development of signs and symptoms of infection. Patient states understanding and is in agreement with the plan.  1. Effusion of right knee         Abigail Butts, PA-C [id number]    Delora Fuel, MD [id number]

## 2016-09-06 NOTE — ED Notes (Signed)
Pt up to bathroom.

## 2016-09-06 NOTE — ED Provider Notes (Signed)
[facility] DEPT Provider Note   CSN: [id number] Arrival date & time: 09/05/16  2049     History   Chief Complaint Chief Complaint  Patient presents with  . Knee Pain    HPI Peter Manning is a 61 y.o. male who presents today with chief complaint of right knee swelling and pain. He states right knee has been progressively swelling more over the last 3 days, associated with difficulty bearing weight on right LE. Pain is a 10/10 constant pain, aggravated by walking and weight bearing, and alleviated somewhat by elevating his LE. He has also tried some ice packs with little relief. Denies any trauma or falls. He states he has had this degree of swelling in his knee before, 2 years ago and again 4-5 months ago, and both times he states he had his knee joint aspirated.   Denies fever/chills, HA, dizziness, CP/SOB, abd pain, n/v/d, dysuria, hematuria.   HPI  Past Medical History:  Diagnosis Date  . Arthritis   . Bronchitis   . Hyperlipidemia   . Hypertension   . OSA (obstructive sleep apnea) 12/09/2011   PSG 01/03/12>>AHI 45.7, SpO2 low 73%, PLMI 0.  CPAP 15 cm H2O>>AHI 0, +R.   Marland Kitchen Pneumonia 2015  . Sleep apnea     Patient Active Problem List   Diagnosis Date Noted  . 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 OSA (obstructive sleep apnea) 12/09/2011  . Chronic bronchitis ([place]) 12/09/2011  . Tobacco abuse 12/09/2011    Past Surgical History:  Procedure Laterality Date  . BACK SURGERY    . JOINT REPLACEMENT    . REVISION TOTAL HIP ARTHROPLASTY Left 11/2015  . TOTAL HIP ARTHROPLASTY    . TOTAL HIP REVISION Left 11/18/2015   Procedure: LEFT TOTAL HIP REVISION;  Surgeon: Marybelle Killings, MD;  Location: [place];  Service: Orthopedics;  Laterality: Left;       Home Medications    Prior to Admission medications   Medication Sig Start Date End Date Taking? Authorizing Provider  albuterol (PROVENTIL HFA;VENTOLIN HFA) 108 (90 BASE) MCG/ACT inhaler Inhale 1-2 puffs into the lungs every  6 (six) hours as needed for wheezing or shortness of breath. Patient not taking: Reported on 06/06/2016 04/17/14   Hyman Bible, PA-C  aspirin EC 325 MG EC tablet Take 1 tablet (325 mg total) by mouth daily with breakfast. Patient not taking: Reported on 06/06/2016 11/20/15   Lanae Crumbly, PA-C  ciprofloxacin (CIPRO) 500 MG tablet Take 1 tablet (500 mg total) by mouth every 12 (twelve) hours. 08/16/16 09/13/16  Karma Greaser, MD  cyclobenzaprine (FLEXERIL) 10 MG tablet Take 1 tablet (10 mg total) by mouth 2 (two) times daily as needed for muscle spasms. Patient not taking: Reported on 06/06/2016 04/07/16   Pattricia Boss, MD  doxycycline (VIBRA-TABS) 100 MG tablet Take 1 tablet (100 mg total) by mouth 2 (two) times daily. 08/22/16   Edrick Kins, DPM  lisinopril (PRINIVIL,ZESTRIL) 20 MG tablet Take 20 mg by mouth daily. 04/23/15   Historical Provider, MD  meloxicam (MOBIC) 7.5 MG tablet Take 1 tablet (7.5 mg total) by mouth daily. Patient not taking: Reported on 06/06/2016 04/07/16   Pattricia Boss, MD  methocarbamol (ROBAXIN) 750 MG tablet Take 1 tablet (750 mg total) by mouth every 6 (six) hours as needed for muscle spasms. Patient not taking: Reported on 06/06/2016 11/20/15   Lanae Crumbly, PA-C  ondansetron [facility] ODT) 8 MG disintegrating tablet Take 1 tablet (8 mg total)  by mouth every 8 (eight) hours as needed for nausea or vomiting. Patient not taking: Reported on 08/22/2016 06/06/16   Jeannett Senior, PA-C  oxyCODONE-acetaminophen (PERCOCET) 10-325 MG tablet Take 1 tablet by mouth every 4 (four) hours as needed for pain. scheduled 11/20/15   Lanae Crumbly, PA-C    Family History Family History  Problem Relation Age of Onset  . Heart attack Father   . Cancer Mother     Social History Social History  Substance Use Topics  . Smoking status: Current Some Day Smoker    Packs/day: 0.50    Years: 39.00    Types: Cigarettes  . Smokeless tobacco: Never Used     Comment: states on and off  smoker  . Alcohol use No     Allergies   Patient has no known allergies.   Review of Systems Review of Systems  Constitutional: Negative for chills and fever.  Respiratory: Negative for shortness of breath.   Cardiovascular: Negative for chest pain.  Gastrointestinal: Negative for abdominal pain, nausea and vomiting.  Genitourinary: Negative for dysuria and hematuria.  Musculoskeletal: Positive for arthralgias and joint swelling.  Skin: Negative for wound.  Neurological: Negative for dizziness and headaches.     Physical Exam Updated Vital Signs BP 123/68   Pulse 77   Temp 98.1 F (36.7 C) (Oral)   Resp 16   SpO2 97%   Physical Exam  Constitutional: He is oriented to person, place, and time. He appears well-developed and well-nourished. No distress.  HENT:  Head: Normocephalic and atraumatic.  Eyes: Conjunctivae are normal. Right eye exhibits no discharge. Left eye exhibits no discharge. No scleral icterus.  Neck: No JVD present. No tracheal deviation present.  Cardiovascular: Normal rate, regular rhythm, normal heart sounds and intact distal pulses.   Pulmonary/Chest: Effort normal.  Musculoskeletal: He exhibits edema.  Right knee edematous, ballotable patella, tender to palpation circumferentially. Limited flexion/extension. No ligamentous laxity, no varus/valgus instability, negative Lachman's, no erythema of overlying skin.  Neurological: He is alert and oriented to person, place, and time.  Skin: Skin is warm and dry. He is not diaphoretic. No erythema.  Psychiatric: He has a normal mood and affect. His behavior is normal. Judgment and thought content normal.     ED Treatments / Results  Labs (all labs ordered are listed, but only abnormal results are displayed) Labs Reviewed  BODY FLUID CULTURE  GRAM STAIN  SYNOVIAL CELL COUNT + DIFF, W/ CRYSTALS  GLUCOSE, SYNOVIAL FLUID  CBG MONITORING, ED    EKG  EKG Interpretation None       Radiology Dg Knee  Complete 4 Views Right  Result Date: 09/05/2016 CLINICAL DATA:  Chronic right knee pain and swelling. EXAM: RIGHT KNEE - COMPLETE 4+ VIEW COMPARISON:  05/12/2016 FINDINGS: No fracture or other acute bony abnormality. Severe osteoarthritic changes involve all compartments. Mild genu valgus. Moderate joint effusion. No bone lesion or bony destruction. IMPRESSION: Severe osteoarthritis.  Moderate joint effusion. Electronically Signed   By: Andreas Newport M.D.   On: 09/05/2016 23:42    Procedures .Joint Aspiration/Arthrocentesis Date/Time: 09/06/2016 1:03 AM Performed by: Rodell Perna A Authorized by: Rodell Perna A   Consent:    Consent obtained:  Written   Consent given by:  Patient   Risks discussed:  Bleeding, infection, pain, incomplete drainage and nerve damage   Alternatives discussed:  No treatment, delayed treatment and alternative treatment Location:    Location:  Knee   Knee:  R knee Anesthesia (see  MAR for exact dosages):    Anesthesia method:  Local infiltration   Local anesthetic:  Lidocaine 1% w/o epi Procedure details:    Preparation: Patient was prepped and draped in usual sterile fashion     Needle gauge:  18 G   Ultrasound guidance: no     Approach:  Lateral   Aspirate amount:  42cc   Aspirate characteristics:  Yellow and clear   Steroid injected: no     Specimen collected: yes   Post-procedure details:    Dressing:  Adhesive bandage   Patient tolerance of procedure:  Tolerated well, no immediate complications     (including critical care time)  Medications Ordered in ED Medications  lidocaine (PF) (XYLOCAINE) 1 % injection 30 mL (30 mLs Intradermal Given 09/05/16 2359)     Initial Impression / Assessment and Plan / ED Course  I have reviewed the triage vital signs and the nursing notes.  Pertinent labs & imaging results that were available during my care of the patient were reviewed by me and considered in my medical decision making (see chart for  details).    61yom presents with 3 day history of worsening right knee swelling and pain. Pt afebrile with stable VS. 4v xray of right knee showed known OA and moderate joint effusion. Obtained consent for diagnostic joint aspiration, explained possible risks of procedure, including infection and bleeding, and benefits. Also discussed alternatives to aspiration. Pt understood risks and benefits and opted to proceed with aspiration. Joint fluid was yellow, non-purulent with no evidence of hemarthrosis. Sample sent for analysis. Do not expect a septic joint or gout due to lack of erythematous, warm joint and clear speciment collected. Given recurrent knee effusion, recommend follow up with ortho for further evaluation.   Signed out to [place], PA-C. Plan: dispo pending sinovial fluid analysis. Recommend future ortho follow up.   Final Clinical Impressions(s) / ED Diagnoses   Final diagnoses:  Effusion of right knee    New Prescriptions New Prescriptions   No medications on file     Renita Papa, [place] 09/06/16 Vel[place], MD 09/06/16 [phone]

## 2016-09-06 NOTE — ED Notes (Signed)
Call made to microbiology for an update on pt gram stain. Informed that they were completing now and results should take up to 15 minutes.

## 2016-09-07 LAB — MISC LABCORP TEST (SEND OUT): LABCORP TEST CODE: 19497

## 2016-09-11 LAB — CULTURE, BODY FLUID W GRAM STAIN -BOTTLE: Culture: NO GROWTH

## 2016-09-11 LAB — CULTURE, BODY FLUID-BOTTLE

## 2016-09-12 ENCOUNTER — Ambulatory Visit (INDEPENDENT_AMBULATORY_CARE_PROVIDER_SITE_OTHER): Payer: Medicare Other | Admitting: Orthopaedic Surgery

## 2016-09-12 ENCOUNTER — Encounter (INDEPENDENT_AMBULATORY_CARE_PROVIDER_SITE_OTHER): Payer: Self-pay | Admitting: Orthopaedic Surgery

## 2016-09-12 VITALS — BP 124/78 | HR 63 | Ht 68.0 in

## 2016-09-12 DIAGNOSIS — M1711 Unilateral primary osteoarthritis, right knee: Secondary | ICD-10-CM

## 2016-09-12 NOTE — Progress Notes (Signed)
Office Visit Note   Patient: Peter Manning           Date of Birth: 25-Mar-1956           MRN: [id number] Visit Date: 09/12/2016              Requested by: Nolene Ebbs, MD [address] [place], [place] [address] PCP: Philis Fendt, MD   Assessment & Plan: Visit Diagnoses:  1. Unilateral primary osteoarthritis, right knee          Patient has severe osteoarthritic changes in his failed conservative treatment.  Plan: Patient has end-stage right knee osteoarthritis large spurs, valgus deformity of 15 with 1-2 cm osteophytes. Flattening of femoral condyle. He's been on anti-inflammatories he's used a cane he said intra-articular injection of cortisone in the pain clinic in January without relief. Patient's also used a knee brace and also a knee sleeve without relief. Patient states he is ready to proceed with surgical intervention. Plan would be right total knee arthroplasty. We discussed tonight stay in the hospital physical therapy after the procedure. He has a daughter lives in town who might be a be available if not he does have to go to a skilled facility for postoperative rehabilitation. We discussed the total knee arthroplasty looked at the models, discussed rehabilitation interop and postop protocol discussed pain control being slightly more difficult since he started taking narcotic medication before the procedure. She had multilevel lumbar fusion in like to have the general anesthesia.  Follow-Up Instructions: No Follow-up on file.   Orders:  No orders of the defined types were placed in this encounter.  No orders of the defined types were placed in this encounter.     Procedures: No procedures performed   Clinical Data: No additional findings.   Subjective: Chief Complaint  Patient presents with  . Right Knee - Pain    Patient presents with right knee pain. He was seen at [facility] ED on 09/05/2016 for knee effusion and pain and had aspiration. Lab results in  chart. He states the right knee is still swollen, but not as bad. He is on Oxycodone 10-325 for chronic pain. Patient did have right knee x-rays made at ED visit.     Review of Systems  Constitutional: Negative for chills and diaphoresis.  HENT: Negative for ear discharge, ear pain and nosebleeds.   Eyes: Negative for discharge and visual disturbance.  Respiratory: Negative for cough, choking and shortness of breath.        Positive for sleep apnea and tobacco use he smokes one half pack per day  Cardiovascular: Negative for chest pain, palpitations and leg swelling.  Gastrointestinal: Negative for abdominal distention and abdominal pain.  Endocrine: Negative for cold intolerance and heat intolerance.  Genitourinary: Negative for flank pain and hematuria.  Musculoskeletal:       Recurrent right knee effusion. He is at aspiration which was clear yellow. No crystals were seen cultures were negative. He is an intra-articular cortisone injection in January without relief and is taken anti-inflammatories also used a cane intermittently.  Skin: Negative for rash and wound.  Neurological: Negative for seizures and speech difficulty.  Hematological: Negative for adenopathy. Does not bruise/bleed easily.  Psychiatric/Behavioral: Negative for agitation and suicidal ideas.   is systems positive for history of obstructive sleep apnea, he still smokes. Total hip arthroplasty on the left in 2001 and revision surgery 2017 for polyethylene wear. He's had a instrument lumbar fusion for severe spinal stenosis. For  diabetes heart disease and CVA. The rest of his review of systems is negative other than as listed here. Patient is followed in a pain clinic.   Objective: Vital Signs: BP 124/78   Pulse 63   Ht 5\' 8"  (1.727 m)   Physical Exam  Constitutional: He is oriented to person, place, and time. He appears well-developed and well-nourished.  HENT:  Head: Normocephalic and atraumatic.  Eyes: EOM are  normal. Pupils are equal, round, and reactive to light.  Neck: No tracheal deviation present. No thyromegaly present.  Cardiovascular: Normal rate.   Pulmonary/Chest: Effort normal. He has no wheezes.  Abdominal: Soft. Bowel sounds are normal.  Musculoskeletal:  Well-healed left total hip arthroplasty incision exam tori with her right knee lip he has 3-4+ effusion right knee no increased warmth. Ligaments are stable knees in standing position he is in 15 of valgus. Palpable medial lateral osteophytes severe crepitus with bone-on-bone. Pulses are intact negative straight leg raising 90. Well-healed lumbar incision anterior tib EHL is intact, normal gastrocsoleus. No pitting edema.  Neurological: He is alert and oriented to person, place, and time.  Skin: Skin is warm and dry. Capillary refill takes less than 2 seconds.  Psychiatric: He has a normal mood and affect. His behavior is normal. Judgment and thought content normal.    Ortho Exam  Specialty Comments:  No specialty comments available.  Imaging: No results found.   PMFS History: Patient Active Problem List   Diagnosis Date Noted  . 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 OSA (obstructive sleep apnea) 12/09/2011  . Chronic bronchitis ([place]) 12/09/2011  . Tobacco abuse 12/09/2011   Past Medical History:  Diagnosis Date  . Arthritis   . Bronchitis   . Hyperlipidemia   . Hypertension   . OSA (obstructive sleep apnea) 12/09/2011   PSG 01/03/12>>AHI 45.7, SpO2 low 73%, PLMI 0.  CPAP 15 cm H2O>>AHI 0, +R.   Marland Kitchen Pneumonia 2015  . Sleep apnea     Family History  Problem Relation Age of Onset  . Heart attack Father   . Cancer Mother     Past Surgical History:  Procedure Laterality Date  . BACK SURGERY    . JOINT REPLACEMENT    . REVISION TOTAL HIP ARTHROPLASTY Left 11/2015  . TOTAL HIP ARTHROPLASTY    . TOTAL HIP REVISION Left 11/18/2015   Procedure: LEFT TOTAL HIP REVISION;  Surgeon: Marybelle Killings, MD;  Location: [facility];   Service: Orthopedics;  Laterality: Left;   Social History   Occupational History  . Not on file.   Social History Main Topics  . Smoking status: Current Some Day Smoker    Packs/day: 0.50    Years: 39.00    Types: Cigarettes  . Smokeless tobacco: Never Used     Comment: states on and off smoker  . Alcohol use No  . Drug use: No  . Sexual activity: Not on file

## 2016-09-13 ENCOUNTER — Ambulatory Visit (INDEPENDENT_AMBULATORY_CARE_PROVIDER_SITE_OTHER): Payer: Self-pay | Admitting: Orthopaedic Surgery

## 2016-09-15 ENCOUNTER — Other Ambulatory Visit (INDEPENDENT_AMBULATORY_CARE_PROVIDER_SITE_OTHER): Payer: Self-pay | Admitting: Orthopaedic Surgery

## 2016-09-15 DIAGNOSIS — M1711 Unilateral primary osteoarthritis, right knee: Secondary | ICD-10-CM

## 2016-09-26 ENCOUNTER — Ambulatory Visit: Payer: Medicare Other | Admitting: Podiatry

## 2016-09-28 NOTE — Pre-Procedure Instructions (Addendum)
Peter Manning  09/28/2016      [place], [place] - [address] [place] [address] Phone: [phone] Fax: [phone]    Your procedure is scheduled on Fri. April 6  Report to [facility] Admitting at 5:30 A.M.  Call this number if you have problems the morning of surgery:  [phone]   Remember:  Do not eat food or drink liquids after midnight on thurs. April 5   Take these medicines the morning of surgery with A SIP OF WATER : amlodipine (norvasc), finasteride (proscar),oxycodone if needed, albuterol if needed--bring to hospital.               1 week prior to surgery stop : aspirin, aleve, motrin, advil, BC Powders, Goody's, vitamins/herbal medicines   Do not wear jewelry.  Do not wear lotions, powders, or perfumes, or deoderant.  Do not shave 48 hours prior to surgery.  Men may shave face and neck.  Do not bring valuables to the hospital.  [facility] is not responsible for any belongings or valuables.  Contacts, dentures or bridgework may not be worn into surgery.  Leave your suitcase in the car.  After surgery it may be brought to your room.  For patients admitted to the hospital, discharge time will be determined by your treatment team.  Patients discharged the day of surgery will not be allowed to drive home.    Special instructions:  [place]- Preparing For Surgery  Before surgery, you can play an important role. Because skin is not sterile, your skin needs to be as free of germs as possible. You can reduce the number of germs on your skin by washing with CHG (chlorahexidine gluconate) Soap before surgery.  CHG is an antiseptic cleaner which kills germs and bonds with the skin to continue killing germs even after washing.  Please do not use if you have an allergy to CHG or antibacterial soaps. If your skin becomes reddened/irritated stop using the CHG.  Do not shave (including legs  and underarms) for at least 48 hours prior to first CHG shower. It is OK to shave your face.  Please follow these instructions carefully.   1. Shower the NIGHT BEFORE SURGERY and the MORNING OF SURGERY with CHG.   2. If you chose to wash your hair, wash your hair first as usual with your normal shampoo.  3. After you shampoo, rinse your hair and body thoroughly to remove the shampoo.  4. Use CHG as you would any other liquid soap. You can apply CHG directly to the skin and wash gently with a scrungie or a clean washcloth.   5. Apply the CHG Soap to your body ONLY FROM THE NECK DOWN.  Do not use on open wounds or open sores. Avoid contact with your eyes, ears, mouth and genitals (private parts). Wash genitals (private parts) with your normal soap.  6. Wash thoroughly, paying special attention to the area where your surgery will be performed.  7. Thoroughly rinse your body with warm water from the neck down.  8. DO NOT shower/wash with your normal soap after using and rinsing off the CHG Soap.  9. Pat yourself dry with a CLEAN TOWEL.   10. Wear CLEAN PAJAMAS   11. Place CLEAN SHEETS on your bed the night of your first shower and DO NOT SLEEP WITH PETS.    Day of Surgery: Do not apply any deodorants/lotions. Please  wear clean clothes to the hospital/surgery center.      Please read over the following fact sheets that you were given. Coughing and Deep Breathing, Total Joint Packet and MRSA Information

## 2016-09-29 ENCOUNTER — Encounter (HOSPITAL_COMMUNITY): Payer: Self-pay

## 2016-09-29 ENCOUNTER — Encounter (HOSPITAL_COMMUNITY)
Admission: RE | Admit: 2016-09-29 | Discharge: 2016-09-29 | Disposition: A | Discharge status: Home / Self Care | Payer: Medicare Other | Source: Ambulatory Visit | Attending: Orthopaedic Surgery | Admitting: Orthopaedic Surgery

## 2016-09-29 ENCOUNTER — Ambulatory Visit (HOSPITAL_COMMUNITY)
Admission: RE | Admit: 2016-09-29 | Discharge: 2016-09-29 | Disposition: A | Discharge status: Home / Self Care | Payer: Medicare Other | Source: Ambulatory Visit | Attending: Orthopaedic Surgery | Admitting: Orthopaedic Surgery

## 2016-09-29 DIAGNOSIS — R9431 Abnormal electrocardiogram [ECG] [EKG]: Secondary | ICD-10-CM | POA: Insufficient documentation

## 2016-09-29 DIAGNOSIS — Z419 Encounter for procedure for purposes other than remedying health state, unspecified: Secondary | ICD-10-CM

## 2016-09-29 DIAGNOSIS — Z01810 Encounter for preprocedural cardiovascular examination: Secondary | ICD-10-CM | POA: Insufficient documentation

## 2016-09-29 DIAGNOSIS — Z01812 Encounter for preprocedural laboratory examination: Secondary | ICD-10-CM | POA: Diagnosis present

## 2016-09-29 DIAGNOSIS — M1711 Unilateral primary osteoarthritis, right knee: Secondary | ICD-10-CM | POA: Diagnosis not present

## 2016-09-29 HISTORY — DX: Inflammatory liver disease, unspecified: K75.9

## 2016-09-29 HISTORY — DX: Personal history of urinary calculi: Z87.442

## 2016-09-29 LAB — COMPREHENSIVE METABOLIC PANEL
ALT: 31 U/L (ref 17–63)
AST: 31 U/L (ref 15–41)
Albumin: 4.2 g/dL (ref 3.5–5.0)
Alkaline Phosphatase: 92 U/L (ref 38–126)
Anion gap: 11 (ref 5–15)
BUN: 14 mg/dL (ref 6–20)
CHLORIDE: 104 mmol/L (ref 101–111)
CO2: 23 mmol/L (ref 22–32)
CREATININE: 1.3 mg/dL — AB (ref 0.61–1.24)
Calcium: 9.9 mg/dL (ref 8.9–10.3)
GFR calc Af Amer: >60 mL/min (ref >60)
GFR calc non Af Amer: 58 mL/min — ABNORMAL LOW (ref >60)
Glucose, Bld: 125 mg/dL — ABNORMAL HIGH (ref 65–99)
POTASSIUM: 3.5 mmol/L (ref 3.5–5.1)
SODIUM: 138 mmol/L (ref 135–145)
Total Bilirubin: 1.1 mg/dL (ref 0.3–1.2)
Total Protein: 7.6 g/dL (ref 6.5–8.1)

## 2016-09-29 LAB — CBC
HCT: 43.1 % (ref 39.0–52.0)
Hemoglobin: 14.7 g/dL (ref 13.0–17.0)
MCH: 32.6 pg (ref 26.0–34.0)
MCHC: 34.1 g/dL (ref 30.0–36.0)
MCV: 95.6 fL (ref 78.0–100.0)
PLATELETS: 241 10*3/uL (ref 150–400)
RBC: 4.51 MIL/uL (ref 4.22–5.81)
RDW: 12.4 % (ref 11.5–15.5)
WBC: 9.3 10*3/uL (ref 4.0–10.5)

## 2016-09-29 LAB — SURGICAL PCR SCREEN
MRSA, PCR: NEGATIVE
STAPHYLOCOCCUS AUREUS: NEGATIVE

## 2016-09-29 NOTE — Progress Notes (Signed)
PCP: Dr. Nolene Ebbs Urology: Dr. Jeffie Pollock Cardiology:none

## 2016-09-29 NOTE — Progress Notes (Signed)
   09/29/16 1224  OBSTRUCTIVE SLEEP APNEA  Have you ever been diagnosed with sleep apnea through a sleep study? No  Do you snore loudly (loud enough to be heard through closed doors)?  0  Do you often feel tired, fatigued, or sleepy during the daytime (such as falling asleep during driving or talking to someone)? 0  Has anyone observed you stop breathing during your sleep? 0  Do you have, or are you being treated for high blood pressure? 1  BMI more than 35 kg/m2? 1  Age > 50 (1-yes) 1  Neck circumference greater than:Male 16 inches or larger, Male 17inches or larger? 1  Male Gender (Yes=1) 1  Obstructive Sleep Apnea Score 5  Score 5 or greater  Results sent to PCP

## 2016-10-05 ENCOUNTER — Ambulatory Visit: Payer: Medicare Other | Admitting: Podiatry

## 2016-10-06 ENCOUNTER — Ambulatory Visit: Payer: Medicare Other | Admitting: Podiatry

## 2016-10-06 MED ORDER — CEFAZOLIN SODIUM-DEXTROSE 2-4 GM/100ML-% IV SOLN: 2.0000 g | INTRAVENOUS | Status: AC

## 2016-10-10 ENCOUNTER — Inpatient Hospital Stay (HOSPITAL_COMMUNITY): Payer: Medicare Other | Admitting: Certified Registered Nurse Anesthetist

## 2016-10-10 ENCOUNTER — Encounter (HOSPITAL_COMMUNITY)
Admission: RE | Disposition: A | Discharge status: Skilled Nursing Facility | Payer: Self-pay | Source: Ambulatory Visit | Attending: Orthopaedic Surgery

## 2016-10-10 ENCOUNTER — Inpatient Hospital Stay (HOSPITAL_COMMUNITY): Payer: Medicare Other

## 2016-10-10 ENCOUNTER — Inpatient Hospital Stay (HOSPITAL_COMMUNITY)
Admission: RE | Admit: 2016-10-10 | Discharge: 2016-10-12 | DRG: 470 | Disposition: A | Discharge status: Skilled Nursing Facility | Payer: Medicare Other | Source: Ambulatory Visit | Attending: Orthopaedic Surgery | Admitting: Orthopaedic Surgery

## 2016-10-10 ENCOUNTER — Encounter (HOSPITAL_COMMUNITY): Payer: Self-pay | Admitting: Certified Registered Nurse Anesthetist

## 2016-10-10 DIAGNOSIS — G4733 Obstructive sleep apnea (adult) (pediatric): Secondary | ICD-10-CM | POA: Diagnosis present

## 2016-10-10 DIAGNOSIS — Z8249 Family history of ischemic heart disease and other diseases of the circulatory system: Secondary | ICD-10-CM | POA: Diagnosis not present

## 2016-10-10 DIAGNOSIS — E785 Hyperlipidemia, unspecified: Secondary | ICD-10-CM | POA: Diagnosis present

## 2016-10-10 DIAGNOSIS — Z6837 Body mass index (BMI) 37.0-37.9, adult: Secondary | ICD-10-CM

## 2016-10-10 DIAGNOSIS — I10 Essential (primary) hypertension: Secondary | ICD-10-CM | POA: Diagnosis present

## 2016-10-10 DIAGNOSIS — M1711 Unilateral primary osteoarthritis, right knee: Secondary | ICD-10-CM | POA: Diagnosis present

## 2016-10-10 DIAGNOSIS — Z87442 Personal history of urinary calculi: Secondary | ICD-10-CM

## 2016-10-10 DIAGNOSIS — Z09 Encounter for follow-up examination after completed treatment for conditions other than malignant neoplasm: Secondary | ICD-10-CM

## 2016-10-10 DIAGNOSIS — Z96642 Presence of left artificial hip joint: Secondary | ICD-10-CM | POA: Diagnosis present

## 2016-10-10 DIAGNOSIS — F17210 Nicotine dependence, cigarettes, uncomplicated: Secondary | ICD-10-CM | POA: Diagnosis present

## 2016-10-10 HISTORY — PX: TOTAL KNEE ARTHROPLASTY: SHX125

## 2016-10-10 SURGERY — ARTHROPLASTY, KNEE, TOTAL
Anesthesia: Monitor Anesthesia Care | Site: Knee | Laterality: Right

## 2016-10-10 MED ORDER — ONDANSETRON HCL 4 MG PO TABS: 4.0000 mg | ORAL_TABLET | Freq: Four times a day (QID) | ORAL | Status: DC | PRN

## 2016-10-10 MED ORDER — ACETAMINOPHEN 325 MG PO TABS
650.0000 mg | ORAL_TABLET | Freq: Four times a day (QID) | ORAL | Status: DC | PRN
  Administered 2016-10-10 – 2016-10-12 (×2): 650 mg via ORAL
  Filled 2016-10-10 (×2): qty 2

## 2016-10-10 MED ORDER — ALBUTEROL SULFATE (2.5 MG/3ML) 0.083% IN NEBU: 3.0000 mL | INHALATION_SOLUTION | Freq: Four times a day (QID) | RESPIRATORY_TRACT | Status: DC | PRN

## 2016-10-10 MED ORDER — ROCURONIUM BROMIDE 50 MG/5ML IV SOSY
PREFILLED_SYRINGE | INTRAVENOUS | Status: AC
  Filled 2016-10-10: qty 5

## 2016-10-10 MED ORDER — CHLORHEXIDINE GLUCONATE 4 % EX LIQD: 60.0000 mL | Freq: Once | CUTANEOUS | Status: DC

## 2016-10-10 MED ORDER — MIDAZOLAM HCL 2 MG/2ML IJ SOLN
INTRAMUSCULAR | Status: AC
  Filled 2016-10-10: qty 2

## 2016-10-10 MED ORDER — FENTANYL CITRATE (PF) 100 MCG/2ML IJ SOLN
100.0000 ug | Freq: Once | INTRAMUSCULAR | Status: AC
  Administered 2016-10-10: 50 ug via INTRAVENOUS
  Filled 2016-10-10: qty 2

## 2016-10-10 MED ORDER — 0.9 % SODIUM CHLORIDE (POUR BTL) OPTIME
TOPICAL | Status: DC | PRN
  Administered 2016-10-10: 1000 mL

## 2016-10-10 MED ORDER — CEFAZOLIN SODIUM-DEXTROSE 2-3 GM-% IV SOLR
INTRAVENOUS | Status: DC | PRN
  Administered 2016-10-10: 2 g via INTRAVENOUS

## 2016-10-10 MED ORDER — PHENYLEPHRINE 40 MCG/ML (10ML) SYRINGE FOR IV PUSH (FOR BLOOD PRESSURE SUPPORT)
PREFILLED_SYRINGE | INTRAVENOUS | Status: AC
  Filled 2016-10-10: qty 20

## 2016-10-10 MED ORDER — PHENYLEPHRINE HCL 10 MG/ML IJ SOLN
INTRAVENOUS | Status: DC | PRN
  Administered 2016-10-10: 30 ug/min via INTRAVENOUS

## 2016-10-10 MED ORDER — OXYCODONE HCL 5 MG/5ML PO SOLN: 5.0000 mg | Freq: Once | ORAL | Status: DC | PRN

## 2016-10-10 MED ORDER — FENTANYL CITRATE (PF) 250 MCG/5ML IJ SOLN
INTRAMUSCULAR | Status: AC
  Filled 2016-10-10: qty 5

## 2016-10-10 MED ORDER — LACTATED RINGERS IV SOLN
INTRAVENOUS | Status: DC
  Administered 2016-10-10 (×2): via INTRAVENOUS

## 2016-10-10 MED ORDER — PROPOFOL 10 MG/ML IV BOLUS
INTRAVENOUS | Status: AC
  Filled 2016-10-10: qty 20

## 2016-10-10 MED ORDER — METHOCARBAMOL 500 MG PO TABS
500.0000 mg | ORAL_TABLET | Freq: Four times a day (QID) | ORAL | Status: DC | PRN
  Administered 2016-10-10 – 2016-10-12 (×5): 500 mg via ORAL
  Filled 2016-10-10 (×5): qty 1

## 2016-10-10 MED ORDER — FINASTERIDE 5 MG PO TABS
5.0000 mg | ORAL_TABLET | Freq: Every day | ORAL | Status: DC
  Administered 2016-10-10 – 2016-10-12 (×3): 5 mg via ORAL
  Filled 2016-10-10 (×3): qty 1

## 2016-10-10 MED ORDER — SODIUM CHLORIDE 0.9 % IR SOLN
Status: DC | PRN
  Administered 2016-10-10: 3000 mL

## 2016-10-10 MED ORDER — CEFAZOLIN SODIUM-DEXTROSE 2-4 GM/100ML-% IV SOLN
INTRAVENOUS | Status: AC
  Filled 2016-10-10: qty 100

## 2016-10-10 MED ORDER — MIDAZOLAM HCL 2 MG/2ML IJ SOLN
2.0000 mg | Freq: Once | INTRAMUSCULAR | Status: AC
  Administered 2016-10-10: 2 mg via INTRAVENOUS
  Filled 2016-10-10: qty 2

## 2016-10-10 MED ORDER — ASPIRIN EC 325 MG PO TBEC
325.0000 mg | DELAYED_RELEASE_TABLET | Freq: Every day | ORAL | Status: DC
  Administered 2016-10-11 – 2016-10-12 (×2): 325 mg via ORAL
  Filled 2016-10-10 (×2): qty 1

## 2016-10-10 MED ORDER — ONDANSETRON HCL 4 MG/2ML IJ SOLN
INTRAMUSCULAR | Status: AC
  Filled 2016-10-10: qty 2

## 2016-10-10 MED ORDER — OXYCODONE HCL 5 MG PO TABS: 5.0000 mg | ORAL_TABLET | Freq: Once | ORAL | Status: DC | PRN

## 2016-10-10 MED ORDER — ACETAMINOPHEN 650 MG RE SUPP: 650.0000 mg | Freq: Four times a day (QID) | RECTAL | Status: DC | PRN

## 2016-10-10 MED ORDER — AMLODIPINE BESYLATE 10 MG PO TABS
10.0000 mg | ORAL_TABLET | Freq: Every day | ORAL | Status: DC
  Administered 2016-10-10 – 2016-10-12 (×3): 10 mg via ORAL
  Filled 2016-10-10 (×3): qty 1

## 2016-10-10 MED ORDER — SODIUM CHLORIDE 0.9 % IV SOLN
INTRAVENOUS | Status: DC
  Administered 2016-10-10 – 2016-10-11 (×2): via INTRAVENOUS

## 2016-10-10 MED ORDER — SUGAMMADEX SODIUM 200 MG/2ML IV SOLN
INTRAVENOUS | Status: AC
  Filled 2016-10-10: qty 2

## 2016-10-10 MED ORDER — PHENYLEPHRINE 40 MCG/ML (10ML) SYRINGE FOR IV PUSH (FOR BLOOD PRESSURE SUPPORT)
PREFILLED_SYRINGE | INTRAVENOUS | Status: AC
  Filled 2016-10-10: qty 10

## 2016-10-10 MED ORDER — PHENYLEPHRINE 40 MCG/ML (10ML) SYRINGE FOR IV PUSH (FOR BLOOD PRESSURE SUPPORT)
PREFILLED_SYRINGE | INTRAVENOUS | Status: DC | PRN
  Administered 2016-10-10: 40 ug via INTRAVENOUS

## 2016-10-10 MED ORDER — POLYETHYLENE GLYCOL 3350 17 G PO PACK: 17.0000 g | PACK | Freq: Every day | ORAL | Status: DC | PRN

## 2016-10-10 MED ORDER — BUPIVACAINE-EPINEPHRINE (PF) 0.5% -1:200000 IJ SOLN
INTRAMUSCULAR | Status: DC | PRN
  Administered 2016-10-10: 15 mL via PERINEURAL

## 2016-10-10 MED ORDER — HYDROMORPHONE HCL 1 MG/ML IJ SOLN
1.0000 mg | INTRAMUSCULAR | Status: DC | PRN
  Administered 2016-10-10 – 2016-10-11 (×5): 1 mg via INTRAVENOUS
  Filled 2016-10-10 (×5): qty 1

## 2016-10-10 MED ORDER — BUPIVACAINE IN DEXTROSE 0.75-8.25 % IT SOLN
INTRATHECAL | Status: DC | PRN
  Administered 2016-10-10: 2 mL via INTRATHECAL

## 2016-10-10 MED ORDER — PROPOFOL 500 MG/50ML IV EMUL
INTRAVENOUS | Status: DC | PRN
  Administered 2016-10-10: 50 ug/kg/min via INTRAVENOUS

## 2016-10-10 MED ORDER — HYDROMORPHONE HCL 1 MG/ML IJ SOLN: 0.2500 mg | INTRAMUSCULAR | Status: DC | PRN

## 2016-10-10 MED ORDER — OXYCODONE HCL 5 MG PO TABS
5.0000 mg | ORAL_TABLET | ORAL | Status: DC | PRN
  Administered 2016-10-10 – 2016-10-12 (×7): 10 mg via ORAL
  Filled 2016-10-10 (×7): qty 2

## 2016-10-10 MED ORDER — LIDOCAINE 2% (20 MG/ML) 5 ML SYRINGE
INTRAMUSCULAR | Status: AC
Start: 2016-10-10 — End: 2016-10-10
  Filled 2016-10-10: qty 10

## 2016-10-10 MED ORDER — METHOCARBAMOL 1000 MG/10ML IJ SOLN
500.0000 mg | Freq: Four times a day (QID) | INTRAVENOUS | Status: DC | PRN
  Filled 2016-10-10: qty 5

## 2016-10-10 MED ORDER — ONDANSETRON HCL 4 MG/2ML IJ SOLN: 4.0000 mg | Freq: Four times a day (QID) | INTRAMUSCULAR | Status: DC | PRN

## 2016-10-10 MED ORDER — MENTHOL 3 MG MT LOZG: 1.0000 | LOZENGE | OROMUCOSAL | Status: DC | PRN

## 2016-10-10 MED ORDER — FENTANYL CITRATE (PF) 100 MCG/2ML IJ SOLN
INTRAMUSCULAR | Status: AC
  Filled 2016-10-10: qty 2

## 2016-10-10 MED ORDER — BUPIVACAINE HCL (PF) 0.25 % IJ SOLN
INTRAMUSCULAR | Status: AC
  Filled 2016-10-10: qty 30

## 2016-10-10 MED ORDER — METOCLOPRAMIDE HCL 5 MG PO TABS: 5.0000 mg | ORAL_TABLET | Freq: Three times a day (TID) | ORAL | Status: DC | PRN

## 2016-10-10 MED ORDER — METOCLOPRAMIDE HCL 5 MG/ML IJ SOLN: 5.0000 mg | Freq: Three times a day (TID) | INTRAMUSCULAR | Status: DC | PRN

## 2016-10-10 MED ORDER — LIDOCAINE 2% (20 MG/ML) 5 ML SYRINGE
INTRAMUSCULAR | Status: DC | PRN
  Administered 2016-10-10: 60 mg via INTRAVENOUS

## 2016-10-10 MED ORDER — BUPIVACAINE HCL (PF) 0.25 % IJ SOLN
INTRAMUSCULAR | Status: DC | PRN
  Administered 2016-10-10: 21 mL

## 2016-10-10 MED ORDER — PHENOL 1.4 % MT LIQD: 1.0000 | OROMUCOSAL | Status: DC | PRN

## 2016-10-10 MED ORDER — DOCUSATE SODIUM 100 MG PO CAPS
100.0000 mg | ORAL_CAPSULE | Freq: Two times a day (BID) | ORAL | Status: DC
  Administered 2016-10-10 – 2016-10-12 (×4): 100 mg via ORAL
  Filled 2016-10-10 (×4): qty 1

## 2016-10-10 SURGICAL SUPPLY — 73 items
APL SKNCLS STERI-STRIP NONHPOA (GAUZE/BANDAGES/DRESSINGS) ×1
BANDAGE ACE 4X5 VEL STRL LF (GAUZE/BANDAGES/DRESSINGS) ×3 IMPLANT
BANDAGE ELASTIC 6 VELCRO ST LF (GAUZE/BANDAGES/DRESSINGS) ×2 IMPLANT
BANDAGE ESMARK 6X9 LF (GAUZE/BANDAGES/DRESSINGS) ×1 IMPLANT
BENZOIN TINCTURE PRP APPL 2/3 (GAUZE/BANDAGES/DRESSINGS) ×3 IMPLANT
BLADE SAGITTAL 25.0X1.19X90 (BLADE) ×2 IMPLANT
BLADE SAGITTAL 25.0X1.19X90MM (BLADE) ×1
BLADE SAW SGTL 13X75X1.27 (BLADE) ×3 IMPLANT
BNDG CMPR 9X6 STRL LF SNTH (GAUZE/BANDAGES/DRESSINGS) ×1
BNDG CMPR MED 10X6 ELC LF (GAUZE/BANDAGES/DRESSINGS) ×1
BNDG ELASTIC 6X10 VLCR STRL LF (GAUZE/BANDAGES/DRESSINGS) ×3 IMPLANT
BNDG ESMARK 6X9 LF (GAUZE/BANDAGES/DRESSINGS) ×3
BOWL SMART MIX CTS (DISPOSABLE) ×3 IMPLANT
CAPT KNEE TOTAL 3 ATTUNE ×2 IMPLANT
CEMENT HV SMART SET (Cement) ×6 IMPLANT
CLOSURE STERI-STRIP 1/2X4 (GAUZE/BANDAGES/DRESSINGS) ×1
CLOSURE WOUND 1/2 X4 (GAUZE/BANDAGES/DRESSINGS) ×2
CLSR STERI-STRIP ANTIMIC 1/2X4 (GAUZE/BANDAGES/DRESSINGS) ×2 IMPLANT
COVER SURGICAL LIGHT HANDLE (MISCELLANEOUS) ×3 IMPLANT
CUFF TOURNIQUET SINGLE 34IN LL (TOURNIQUET CUFF) ×3 IMPLANT
CUFF TOURNIQUET SINGLE 44IN (TOURNIQUET CUFF) IMPLANT
DRAPE ORTHO SPLIT 77X108 STRL (DRAPES) ×6
DRAPE SURG ORHT 6 SPLT 77X108 (DRAPES) ×2 IMPLANT
DRAPE U-SHAPE 47X51 STRL (DRAPES) ×3 IMPLANT
DRSG PAD ABDOMINAL 8X10 ST (GAUZE/BANDAGES/DRESSINGS) ×3 IMPLANT
DURAPREP 26ML APPLICATOR (WOUND CARE) ×3 IMPLANT
ELECT REM PT RETURN 9FT ADLT (ELECTROSURGICAL) ×3
ELECTRODE REM PT RTRN 9FT ADLT (ELECTROSURGICAL) ×1 IMPLANT
EVACUATOR 1/8 PVC DRAIN (DRAIN) IMPLANT
FACESHIELD WRAPAROUND (MASK) ×3 IMPLANT
FACESHIELD WRAPAROUND OR TEAM (MASK) ×1 IMPLANT
GAUZE SPONGE 4X4 12PLY STRL (GAUZE/BANDAGES/DRESSINGS) ×3 IMPLANT
GAUZE SPONGE 4X4 16PLY XRAY LF (GAUZE/BANDAGES/DRESSINGS) ×2 IMPLANT
GAUZE XEROFORM 5X9 LF (GAUZE/BANDAGES/DRESSINGS) ×3 IMPLANT
GLOVE BIOGEL PI IND STRL 8 (GLOVE) ×2 IMPLANT
GLOVE BIOGEL PI INDICATOR 8 (GLOVE) ×4
GLOVE ORTHO TXT STRL SZ7.5 (GLOVE) ×6 IMPLANT
GOWN STRL REUS W/ TWL LRG LVL3 (GOWN DISPOSABLE) ×1 IMPLANT
GOWN STRL REUS W/ TWL XL LVL3 (GOWN DISPOSABLE) ×1 IMPLANT
GOWN STRL REUS W/TWL 2XL LVL3 (GOWN DISPOSABLE) ×3 IMPLANT
GOWN STRL REUS W/TWL LRG LVL3 (GOWN DISPOSABLE) ×3
GOWN STRL REUS W/TWL XL LVL3 (GOWN DISPOSABLE) ×3
HANDPIECE INTERPULSE COAX TIP (DISPOSABLE) ×3
IMMOBILIZER KNEE 22 (SOFTGOODS) ×2 IMPLANT
IMMOBILIZER KNEE 22 UNIV (SOFTGOODS) ×3 IMPLANT
KIT BASIN OR (CUSTOM PROCEDURE TRAY) ×3 IMPLANT
KIT ROOM TURNOVER OR (KITS) ×3 IMPLANT
MANIFOLD NEPTUNE II (INSTRUMENTS) ×3 IMPLANT
MARKER SKIN DUAL TIP RULER LAB (MISCELLANEOUS) ×3 IMPLANT
NDL HYPO 25GX1X1/2 BEV (NEEDLE) ×1 IMPLANT
NEEDLE HYPO 25GX1X1/2 BEV (NEEDLE) ×3 IMPLANT
NS IRRIG 1000ML POUR BTL (IV SOLUTION) ×3 IMPLANT
PACK TOTAL JOINT (CUSTOM PROCEDURE TRAY) ×3 IMPLANT
PAD ARMBOARD 7.5X6 YLW CONV (MISCELLANEOUS) ×6 IMPLANT
PAD CAST 4YDX4 CTTN HI CHSV (CAST SUPPLIES) ×1 IMPLANT
PADDING CAST COTTON 4X4 STRL (CAST SUPPLIES) ×3
PADDING CAST COTTON 6X4 STRL (CAST SUPPLIES) ×3 IMPLANT
SET HNDPC FAN SPRY TIP SCT (DISPOSABLE) ×1 IMPLANT
STAPLER VISISTAT 35W (STAPLE) ×3 IMPLANT
STRIP CLOSURE SKIN 1/2X4 (GAUZE/BANDAGES/DRESSINGS) ×4 IMPLANT
SUCTION FRAZIER HANDLE 10FR (MISCELLANEOUS) ×2
SUCTION TUBE FRAZIER 10FR DISP (MISCELLANEOUS) ×1 IMPLANT
SUT VIC AB 0 CT1 27 (SUTURE) ×3
SUT VIC AB 0 CT1 27XBRD ANBCTR (SUTURE) ×1 IMPLANT
SUT VIC AB 1 CTX 36 (SUTURE) ×6
SUT VIC AB 1 CTX36XBRD ANBCTR (SUTURE) ×2 IMPLANT
SUT VIC AB 2-0 CT1 27 (SUTURE) ×6
SUT VIC AB 2-0 CT1 TAPERPNT 27 (SUTURE) ×2 IMPLANT
SUT VIC AB 3-0 X1 27 (SUTURE) ×3 IMPLANT
SYR CONTROL 10ML LL (SYRINGE) ×3 IMPLANT
TOWEL OR 17X24 6PK STRL BLUE (TOWEL DISPOSABLE) ×3 IMPLANT
TOWEL OR 17X26 10 PK STRL BLUE (TOWEL DISPOSABLE) ×3 IMPLANT
TRAY CATH 16FR W/PLASTIC CATH (SET/KITS/TRAYS/PACK) IMPLANT

## 2016-10-10 NOTE — Brief Op Note (Signed)
10/10/2016  5:02 PM  PATIENT:  Peter Manning  61 y.o. male  PRE-OPERATIVE DIAGNOSIS:  RIGHT KNEE OSTEOARTHRITIS  POST-OPERATIVE DIAGNOSIS:  RIGHT KNEE OSTEOARTHRITIS  PROCEDURE:  Procedure(s): TOTAL KNEE ARTHROPLASTY (Right)  SURGEON:  Surgeon(s) and Role:    * Marybelle Killings, MD - Primary  PHYSICIAN ASSISTANT: Benjiman Core    ANESTHESIA:   spinal  EBL:  Total I/O In: 750 [I.V.:750] Out: 20 [Blood:20]  BLOOD ADMINISTERED:none  DRAINS: none   LOCAL MEDICATIONS USED:  MARCAINE     SPECIMEN:  No Specimen  DISPOSITION OF SPECIMEN:  none  COUNTS:  YES  TOURNIQUET:   Total Tourniquet Time Documented: Thigh (Right) - 60 minutes Total: Thigh (Right) - 60 minutes   DICTATION: .Viviann Spare Dictation  PLAN OF CARE: Admit to inpatient   PATIENT DISPOSITION:  PACU - hemodynamically stable.

## 2016-10-10 NOTE — Transfer of Care (Signed)
Immediate Anesthesia Transfer of Care Note  Patient: Peter Manning  Procedure(s) Performed: Procedure(s): TOTAL KNEE ARTHROPLASTY (Right)  Patient Location: PACU  Anesthesia Type:Spinal and MAC combined with regional for post-op pain  Level of Consciousness: awake, alert , oriented and patient cooperative  Airway & Oxygen Therapy: Patient Spontanous Breathing and Patient connected to nasal cannula oxygen  Post-op Assessment: Report given to RN, Post -op Vital signs reviewed and stable and Patient moving all extremities X 4  Post vital signs: Reviewed and stable  Last Vitals:  Vitals:   10/10/16 1027 10/10/16 1657  BP: (!) 152/80   Pulse: 62   Resp: 20   Temp: 36.3 C (P) 36.1 C    Last Pain:  Vitals:   10/10/16 1040  TempSrc:   PainSc: 6       Patients Stated Pain Goal: 4 (70/17/79 3903)  Complications: No apparent anesthesia complications

## 2016-10-10 NOTE — Progress Notes (Signed)
Orthopedic Tech Progress Note Patient Details:  Peter Manning 22-May-1956 [id number]  CPM Right Knee CPM Right Knee: On Right Knee Flexion (Degrees): 90 Right Knee Extension (Degrees): 0   [name] 10/10/2016, 6:05 PM

## 2016-10-10 NOTE — H&P (Signed)
TOTAL KNEE ADMISSION H&P  Patient is being admitted for right total knee arthroplasty.  Subjective:  Chief Complaint:right knee pain.  HPI: Peter Manning, 61 y.o. male, has a history of pain and functional disability in the right knee due to arthritis and has failed non-surgical conservative treatments for greater than 12 weeks to includeNSAID's and/or analgesics, corticosteriod injections, use of assistive devices and activity modification.  Onset of symptoms was gradual, starting 10 years ago with gradually worsening course since that time.  Patient currently rates pain in the right knee(s) at 10 out of 10 with activity. Patient has night pain, worsening of pain with activity and weight bearing, pain that interferes with activities of daily living, pain with passive range of motion, crepitus and joint swelling.  Patient has evidence of subchondral cysts, subchondral sclerosis, periarticular osteophytes and joint space narrowing by imaging studies.  There is no active infection.  Patient Active Problem List   Diagnosis Date Noted  . 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 OSA (obstructive sleep apnea) 12/09/2011  . Chronic bronchitis ([place]) 12/09/2011  . Tobacco abuse 12/09/2011   Past Medical History:  Diagnosis Date  . Arthritis   . Bronchitis   . Hepatitis 1976   pt. doesn't remember if A, B, C?  . History of kidney stones   . Hyperlipidemia   . Hypertension   . OSA (obstructive sleep apnea) 12/09/2011   PSG 01/03/12>>AHI 45.7, SpO2 low 73%, PLMI 0.  CPAP 15 cm H2O>>AHI 0, +R.   Marland Kitchen Pneumonia 2015  . Sleep apnea     Past Surgical History:  Procedure Laterality Date  . BACK SURGERY    . JOINT REPLACEMENT    . REVISION TOTAL HIP ARTHROPLASTY Left 11/2015  . TOTAL HIP ARTHROPLASTY    . TOTAL HIP REVISION Left 11/18/2015   Procedure: LEFT TOTAL HIP REVISION;  Surgeon: Marybelle Killings, MD;  Location: [facility];  Service: Orthopedics;  Laterality: Left;    No prescriptions prior to admission.    Allergies  Allergen Reactions  . No Known Allergies     Social History  Substance Use Topics  . Smoking status: Current Some Day Smoker    Packs/day: 0.50    Years: 39.00    Types: Cigarettes  . Smokeless tobacco: Never Used     Comment: states on and off smoker  . Alcohol use No    Family History  Problem Relation Age of Onset  . Heart attack Father   . Cancer Mother      Review of Systems  Constitutional: Negative.   HENT: Negative.   Respiratory: Negative.   Cardiovascular: Negative.   Gastrointestinal: Negative.   Genitourinary: Negative.   Musculoskeletal: Positive for joint pain.  Skin: Negative.   Neurological: Negative.   Psychiatric/Behavioral: Negative.     Objective:  Physical Exam  Constitutional: He is oriented to person, place, and time. No distress.  HENT:  Head: Normocephalic and atraumatic.  Eyes: EOM are normal. Pupils are equal, round, and reactive to light.  Neck: Normal range of motion.  Respiratory: No respiratory distress.  GI: He exhibits no distension.  Musculoskeletal: He exhibits tenderness.  Decreased knee ROM, crepitus and effusion.  Neurological: He is alert and oriented to person, place, and time.  Skin: Skin is warm and dry.  Psychiatric: He has a normal mood and affect.    Vital signs in last 24 hours:    Labs:   Estimated body mass index is 37.86 kg/m as calculated  from the following:   Height as of 09/29/16: 5\' 8"  (1.727 m).   Weight as of 09/29/16: 249 lb (112.9 kg).   Imaging Review Plain radiographs demonstrate moderate degenerative joint disease of the right knee(s). The overall alignment ismild varus. The bone quality appears to be good for age and reported activity level.  Assessment/Plan:  End stage arthritis, right knee   The patient history, physical examination, clinical judgment of the provider and imaging studies are consistent with end stage degenerative joint disease of the right knee(s) and total  knee arthroplasty is deemed medically necessary. The treatment options including medical management, injection therapy arthroscopy and arthroplasty were discussed at length. The risks and benefits of total knee arthroplasty were presented and reviewed. The risks due to aseptic loosening, infection, stiffness, patella tracking problems, thromboembolic complications and other imponderables were discussed. The patient acknowledged the explanation, agreed to proceed with the plan and consent was signed. Patient is being admitted for inpatient treatment for surgery, pain control, PT, OT, prophylactic antibiotics, VTE prophylaxis, progressive ambulation and ADL's and discharge planning. The patient is planning to be discharged home with home health services

## 2016-10-10 NOTE — Op Note (Signed)
Preop diagnosis: Right knee primary osteoarthritis  Postop diagnosis same  Procedure: Right cemented total knee arthroplasty Depuy attune size 7 femur rotating platform 41 mm 3 peg patella #7 tibia.  Surgeon Rodell Perna M.D.  Asst. Benjiman Core PA-C medically necessary and present for the entire procedure  Anesthesia : Spinal.  Tourniquet time one hour and 2 minutes.  Procedure standard prepping draping impervious stockinette Caban usual sterile skin marker Betadine Steri-Drape leg was wrapped an Esmarch after timeout procedure Ancef prophylaxis. Midline incision was made medial parapatellar incision splitting the quad tendon between the medial one third lateral two thirds. Patella was everted 10 mm resected size and the drilled for 41 mm. There were hypertrophic osteophytes on each side of the femur multiple loose bodies some almost the size of the golf ball. These were removed. There is degenerative medial lateral meniscus with complex tears which were resected anterior cruciate ligament PCL resected. Trial sizing after intramedullary drill up the femur was a 710 mm taken off the femur 9 mm on the tibia. 5 mm spacer gap gave excellent full extension patient and 15 flexion contracture preoperatively. Chamfer cuts made on the femur box cut and he'll preparation on the tibia pulse lavage resection of some remaining small osteophytes and ridges. Vacuum mixing of cement after trials showed the full extension and good collateral balance flexion-extension. Tibia cemented first followed by femur #7 size for both and then placement of the 5 mm rotating platform. 41 mm patella. Lavage with the saline solution tourniquet was deflated at 15 minutes when the cement was hard hemostasis of sustained and then standard layer closure #1 Vicryl the deep layer 2-0 Vicryl subtendinous tissue skin closure staples and postop dressing.

## 2016-10-10 NOTE — Anesthesia Preprocedure Evaluation (Addendum)
Anesthesia Evaluation  Patient identified by MRN, date of birth, ID band Patient awake    Reviewed: Allergy & Precautions, NPO status , Patient's Chart, lab work & pertinent test results  History of Anesthesia Complications Negative for: history of anesthetic complications  Airway Mallampati: II  TM Distance: >3 FB Neck ROM: Full    Dental  (+) Missing, Dental Advisory Given   Pulmonary sleep apnea , Current Smoker,    breath sounds clear to auscultation       Cardiovascular hypertension, Pt. on medications  Rhythm:Regular     Neuro/Psych negative neurological ROS  negative psych ROS   GI/Hepatic negative GI ROS, (+) Hepatitis -  Endo/Other  Morbid obesity  Renal/GU Renal InsufficiencyRenal disease     Musculoskeletal  (+) Arthritis ,   Abdominal   Peds  Hematology   Anesthesia Other Findings   Reproductive/Obstetrics                            Anesthesia Physical Anesthesia Plan  ASA: II  Anesthesia Plan: MAC, Regional and Spinal   Post-op Pain Management:    Induction:   Airway Management Planned: Natural Airway, Nasal Cannula and Simple Face Mask  Additional Equipment: None  Intra-op Plan:   Post-operative Plan:   Informed Consent: I have reviewed the patients History and Physical, chart, labs and discussed the procedure including the risks, benefits and alternatives for the proposed anesthesia with the patient or authorized representative who has indicated his/her understanding and acceptance.   Dental advisory given  Plan Discussed with: CRNA, Surgeon and Anesthesiologist  Anesthesia Plan Comments:        Anesthesia Quick Evaluation

## 2016-10-10 NOTE — Anesthesia Procedure Notes (Signed)
Spinal  Patient location during procedure: OR Start time: 10/10/2016 2:15 PM End time: 10/10/2016 2:22 PM Staffing Anesthesiologist: Oleta Mouse Preanesthetic Checklist Completed: patient identified, surgical consent, pre-op evaluation, timeout performed, IV checked, risks and benefits discussed and monitors and equipment checked Spinal Block Patient position: sitting Prep: site prepped and draped and DuraPrep Patient monitoring: heart rate, cardiac monitor, continuous pulse ox and blood pressure Approach: midline Location: L3-4 Injection technique: single-shot Needle Needle type: Pencan  Needle gauge: 24 G Needle length: 10 cm Assessment Sensory level: T4

## 2016-10-10 NOTE — Anesthesia Postprocedure Evaluation (Signed)
Anesthesia Post Note  Patient: Peter Manning  Procedure(s) Performed: Procedure(s) (LRB): TOTAL KNEE ARTHROPLASTY (Right)  Patient location during evaluation: PACU Anesthesia Type: Regional and Spinal Level of consciousness: awake, awake and alert and oriented Pain management: pain level controlled Vital Signs Assessment: post-procedure vital signs reviewed and stable Respiratory status: spontaneous breathing, nonlabored ventilation and aerosol facemask Cardiovascular status: blood pressure returned to baseline Postop Assessment: spinal receding Anesthetic complications: no       Last Vitals:  Vitals:   10/10/16 1758 10/10/16 1821  BP: 128/88 123/79  Pulse: (!) 56 60  Resp: 16   Temp:  36.4 C    Last Pain:  Vitals:   10/10/16 1821  TempSrc: Oral  PainSc:                  Nevaeha Finerty COKER

## 2016-10-10 NOTE — Interval H&P Note (Signed)
History and Physical Interval Note:  10/10/2016 1:25 PM  Peter Manning  has presented today for surgery, with the diagnosis of RIGHT KNEE OSTEOARTHRITIS  The various methods of treatment have been discussed with the patient and family. After consideration of risks, benefits and other options for treatment, the patient has consented to  Procedure(s): TOTAL KNEE ARTHROPLASTY (Right) as a surgical intervention .  The patient's history has been reviewed, patient examined, no change in status, stable for surgery.  I have reviewed the patient's chart and labs.  Questions were answered to the patient's satisfaction.     Marybelle Killings

## 2016-10-10 NOTE — Anesthesia Procedure Notes (Signed)
Anesthesia Regional Block: Adductor canal block   Pre-Anesthetic Checklist: ,, timeout performed, Correct Patient, Correct Site, Correct Laterality, Correct Procedure, Correct Position, site marked, Risks and benefits discussed,  Surgical consent,  Pre-op evaluation,  At surgeon's request and post-op pain management  Laterality: Lower and Right  Prep: chloraprep       Needles:  Injection technique: Single-shot  Needle Type: Echogenic Stimulator Needle          Additional Needles:   Procedures: ultrasound guided,,,,,,,,  Narrative:  Start time: 10/10/2016 2:01 PM End time: 10/10/2016 2:06 PM Injection made incrementally with aspirations every 5 mL.  Performed by: Personally  Anesthesiologist: Shamar Kracke  Additional Notes: H+P and labs reviewed, risks and benefits discussed with patient, procedure tolerated well without complications

## 2016-10-11 ENCOUNTER — Encounter (HOSPITAL_COMMUNITY): Payer: Self-pay | Admitting: Orthopaedic Surgery

## 2016-10-11 LAB — BASIC METABOLIC PANEL
ANION GAP: 10 (ref 5–15)
BUN: 8 mg/dL (ref 6–20)
CALCIUM: 9.1 mg/dL (ref 8.9–10.3)
CO2: 28 mmol/L (ref 22–32)
Chloride: 101 mmol/L (ref 101–111)
Creatinine, Ser: 1.06 mg/dL (ref 0.61–1.24)
GFR calc non Af Amer: >60 mL/min (ref >60)
Glucose, Bld: 127 mg/dL — ABNORMAL HIGH (ref 65–99)
Potassium: 3.4 mmol/L — ABNORMAL LOW (ref 3.5–5.1)
Sodium: 139 mmol/L (ref 135–145)

## 2016-10-11 LAB — CBC
HEMATOCRIT: 37.4 % — AB (ref 39.0–52.0)
HEMOGLOBIN: 12.5 g/dL — AB (ref 13.0–17.0)
MCH: 32.2 pg (ref 26.0–34.0)
MCHC: 33.4 g/dL (ref 30.0–36.0)
MCV: 96.4 fL (ref 78.0–100.0)
Platelets: 228 10*3/uL (ref 150–400)
RBC: 3.88 MIL/uL — ABNORMAL LOW (ref 4.22–5.81)
RDW: 12.8 % (ref 11.5–15.5)
WBC: 11.8 10*3/uL — AB (ref 4.0–10.5)

## 2016-10-11 NOTE — Clinical Social Work Note (Signed)
Clinical Social Work Assessment  Patient Details  Name: Peter Manning MRN: [id number] Date of Birth: April 12, 1956  Date of referral:  10/11/16               Reason for consult:  Facility Placement, Discharge Planning                Permission sought to share information with:  Case Manager, Customer service manager Permission granted to share information::  Yes, Verbal Permission Granted  Name::        Agency::  [facility]  Relationship::  Family involved, patient has contacted, no one present in room  Contact Information:     Housing/Transportation Living arrangements for the past 2 months:  [place] of Information:  Patient Patient Interpreter Needed:  None Criminal Activity/Legal Involvement Pertinent to Current Situation/Hospitalization:  No - Comment as needed Significant Relationships:  Other Family Members Lives with:  Self Do you feel safe going back to the place where you live?  No Need for family participation in patient care:  No (Coment)  Care giving concerns:  Patient coming from home alone.  Reports limited support at home and he would like for rehab at discharge to continue to progress.  He reports he has bee to [facility] with his last surgery and reports he would like to return.    Social Worker assessment / plan:  LCSW met with patient at bedside.  He was sleeping, but awoke easily. Explained role, reason for consult and SW involvement. Agreeable to SNF at discharge and wanting to return to [facility].  LCSW completed SNF work up   Plan: SNF, follow up with bed offers.  Employment status:  Retired Nurse, adult PT Recommendations:  [place] / Referral to community resources:  [place]  Patient/Family's Response to care:  Agreeable to plan  Patient/Family's Understanding of and Emotional Response to Diagnosis, Current Treatment, and Prognosis:  Limited response  related to treatment. Reports he wants to go home, but agreeable to recommendations. Appreciative of education and time.  Emotional Assessment Appearance:  Appears stated age Attitude/Demeanor/Rapport:    Affect (typically observed):  Accepting, Adaptable, Pleasant Orientation:  Oriented to Self, Oriented to Place, Oriented to  Time, Oriented to Situation Alcohol / Substance use:  Not Applicable Psych involvement (Current and /or in the community):  No (Comment)  Discharge Needs  Concerns to be addressed:  No discharge needs identified Readmission within the last 30 days:  No Current discharge risk:  None Barriers to Discharge:  No Barriers Identified, Continued Medical Work up   [place], LCSW 10/11/2016, 2:35 PM

## 2016-10-11 NOTE — Evaluation (Signed)
Occupational Therapy Evaluation Patient Details Name: Peter Manning MRN: [id number] DOB: Jul 06, 1955 Today's Date: 10/11/2016    History of Present Illness pt presents post R TKA. pt with hx of OSA, Chronic Bronchitis, HTN, L THR, L TKR, and Back Surgery.   Clinical Impression   PTA, pt lived alone and was independent. Currently, pt demonstrates limited function with difficulty performing grooming in standing, LB ADLs, and functional mobility. Pt would benefit from continued skilled OT to facilitate progress towards goals and safe dc. Recommend dc to SNF for further OT to increase independence in ADLs and functional mobility.     Follow Up Recommendations  SNF;Supervision/Assistance - 24 hour    Equipment Recommendations  Other (comment) (Defer to next venue)    Recommendations for Other Services PT consult     Precautions / Restrictions Precautions Precautions: Knee Precaution Comments: Wore knee immobilizer and educated on importance of resting with knee extended Required Braces or Orthoses: Knee Immobilizer - Right Restrictions Weight Bearing Restrictions: Yes RLE Weight Bearing: Weight bearing as tolerated      Mobility Bed Mobility Overal bed mobility: Needs Assistance Bed Mobility: Supine to Sit     Supine to sit: Min guard     General bed mobility comments: Increased time. Moved slowly  Transfers Overall transfer level: Needs assistance Equipment used: Rolling walker (2 wheeled) Transfers: Sit to/from Stand Sit to Stand: Min assist         General transfer comment: Min A to steady at upright and VCs for hand placement to recliner. guiding to seated    Balance Overall balance assessment: Needs assistance Sitting-balance support: Feet supported;No upper extremity supported Sitting balance-Leahy Scale: Good     Standing balance support: During functional activity;Bilateral upper extremity supported Standing balance-Leahy Scale: Fair Standing balance  comment: Required RW for fucntional mobility.                           ADL either performed or assessed with clinical judgement   ADL Overall ADL's : Needs assistance/impaired Eating/Feeding: Set up;Sitting   Grooming: Set up;Sitting Grooming Details (indicate cue type and reason): Pt washed his face at bed level and declined to perform grooming at sink at this time Upper Body Bathing: Set up;Sitting   Lower Body Bathing: Minimal assistance;Sit to/from stand   Upper Body Dressing : Set up;Sitting   Lower Body Dressing: Moderate assistance;Sit to/from stand Lower Body Dressing Details (indicate cue type and reason): When asked to adjust socks, pt states "I can't do that right now" (Feel pt will progress with time and rest) Toilet Transfer: Minimal assistance;Ambulation;RW (Simulated to recliner)           Functional mobility during ADLs: Minimal assistance;Rolling walker General ADL Comments: Pt has decreased occupational participation due to tireness and pain. Declined to perform ADLs at this time     Vision         Perception     Praxis      Pertinent Vitals/Pain Pain Assessment: 0-10 Pain Score: 10-Worst pain ever Pain Location: R knee Pain Descriptors / Indicators: Constant;Discomfort;Grimacing;Guarding Pain Intervention(s): Monitored during session;Limited activity within patient's tolerance     Hand Dominance Right   Extremity/Trunk Assessment Upper Extremity Assessment Upper Extremity Assessment: Overall WFL for tasks assessed   Lower Extremity Assessment Lower Extremity Assessment: Defer to PT evaluation;RLE deficits/detail RLE Deficits / Details: R Knee limited due to pain RLE: Unable to fully assess due to immobilization;Unable to fully assess due  to pain   Cervical / Trunk Assessment Cervical / Trunk Assessment: Normal   Communication Communication Communication: Other (comment);No difficulties (Fatigue and sleepiness which impacted  conversation)   Cognition Arousal/Alertness: Lethargic (Very sleepy) Behavior During Therapy: Flat affect Overall Cognitive Status: Within Functional Limits for tasks assessed                                 General Comments: Pt very tired and stated that he did not sleep well. Had a difficult time keeping eyes open during conversation   General Comments       Exercises     Shoulder Instructions      Home Living Family/patient expects to be discharged to:: Skilled nursing facility                                 Additional Comments: Pt's plan is to dc to SNF for rehab since he does not have A at home      Prior Functioning/Environment Level of Independence: Independent                 OT Problem List: Decreased strength;Decreased activity tolerance;Impaired balance (sitting and/or standing);Decreased knowledge of use of DME or AE;Decreased safety awareness;Pain      OT Treatment/Interventions: Self-care/ADL training;Therapeutic exercise;Energy conservation;DME and/or AE instruction;Patient/family education;Therapeutic activities    OT Goals(Current goals can be found in the care plan section) Acute Rehab OT Goals Patient Stated Goal: Decreased pain OT Goal Formulation: With patient Time For Goal Achievement: 10/25/16 Potential to Achieve Goals: Good ADL Goals Pt Will Perform Grooming: with min guard assist;standing Pt Will Perform Upper Body Dressing: with set-up;sitting Pt Will Perform Lower Body Dressing: with min guard assist;sit to/from stand Pt Will Transfer to Toilet: with min guard assist;bedside commode;ambulating Pt Will Perform Toileting - Clothing Manipulation and hygiene: with min guard assist;sit to/from stand  OT Frequency: Min 2X/week   Barriers to D/C: Decreased caregiver support          Co-evaluation              End of Session Equipment Utilized During Treatment: Gait belt;Rolling walker;Right knee  immobilizer Nurse Communication: Mobility status  Activity Tolerance: Patient limited by fatigue;Patient limited by pain Patient left: in chair;with call bell/phone within reach  OT Visit Diagnosis: Unsteadiness on feet (R26.81);Muscle weakness (generalized) (M62.81);Pain Pain - Right/Left: Right Pain - part of body: Knee                Time: 4010-2725 OT Time Calculation (min): 19 min Charges:  OT General Charges $OT Visit: 1 Procedure OT Evaluation $OT Eval Low Complexity: 1 Procedure G-Codes:     Luretta Everly, OTR/L [phone]  [place] 10/11/2016, 9:39 AM

## 2016-10-11 NOTE — Evaluation (Signed)
Physical Therapy Evaluation Patient Details Name: Peter Manning MRN: [id number] DOB: Nov 01, 1955 Today's Date: 10/11/2016   History of Present Illness  pt presents post R TKA. pt with hx of OSA, Chronic Bronchitis, HTN, L THR, L TKR, and Back Surgery.  Clinical Impression  Pt is s/p TKA resulting in the deficits listed below (see PT Problem List). Pt is min guard for bed mobility, and minAx1 for transfers to/from RW and minAx1 for ambulation of 15 feet with RW. Pt very lethargic and had difficulty staying awake during session.  Pt will benefit from skilled PT to increase their independence and safety with mobility to allow discharge to the venue listed below.      Follow Up Recommendations SNF;Supervision/Assistance - 24 hour    Equipment Recommendations   (TBD at next venue)    Recommendations for Other Services OT consult     Precautions / Restrictions Precautions Precautions: Knee Precaution Comments: Wore knee immobilizer and educated on importance of resting with knee extended Required Braces or Orthoses: Knee Immobilizer - Right Restrictions Weight Bearing Restrictions: Yes RLE Weight Bearing: Weight bearing as tolerated      Mobility  Bed Mobility Overal bed mobility: Needs Assistance Bed Mobility: Supine to Sit     Supine to sit: Min guard     General bed mobility comments: Increased time. Moved slowly  Transfers Overall transfer level: Needs assistance Equipment used: Rolling walker (2 wheeled) Transfers: Sit to/from Stand Sit to Stand: Min assist         General transfer comment: Min A to steady at upright and VCs for hand placement to recliner. guiding to seated  Ambulation/Gait Ambulation/Gait assistance: Min assist Ambulation Distance (Feet): 15 Feet Assistive device: Rolling walker (2 wheeled) Gait Pattern/deviations: Step-to pattern;Decreased step length - left;Decreased stance time - right;Decreased weight shift to right;Shuffle;Trunk flexed Gait  velocity: slowed Gait velocity interpretation: Below normal speed for age/gender General Gait Details: pt with flexed posture and increased UE support, vc for upright posture, opening eyes and looking up and out where he is going. No buckling, no LoB.       Balance Overall balance assessment: Needs assistance Sitting-balance support: Feet supported;No upper extremity supported Sitting balance-Leahy Scale: Good     Standing balance support: During functional activity;Bilateral upper extremity supported Standing balance-Leahy Scale: Fair Standing balance comment: Required RW for fucntional mobility.                             Pertinent Vitals/Pain Pain Assessment: 0-10 Pain Score: 10-Worst pain ever Pain Location: R knee Pain Descriptors / Indicators: Constant;Discomfort;Grimacing;Guarding Pain Intervention(s): Monitored during session;Limited activity within patient's tolerance            VSS  Home Living Family/patient expects to be discharged to:: Skilled nursing facility                 Additional Comments: Pt's plan is to dc to SNF for rehab since he does not have A at home    Prior Function Level of Independence: Independent               Hand Dominance   Dominant Hand: Right    Extremity/Trunk Assessment   Upper Extremity Assessment Upper Extremity Assessment: Defer to OT evaluation    Lower Extremity Assessment Lower Extremity Assessment: RLE deficits/detail RLE Deficits / Details: R Knee limited due to surgical pain RLE: Unable to fully assess due to immobilization;Unable to fully assess due  to pain    Cervical / Trunk Assessment Cervical / Trunk Assessment: Normal  Communication   Communication: Other (comment);No difficulties (Fatigue and sleepiness which impacted conversation)  Cognition Arousal/Alertness: Lethargic (Very sleepy) Behavior During Therapy: Flat affect Overall Cognitive Status: Within Functional Limits for tasks  assessed                                 General Comments: Pt very tired and stated that he did not sleep well. Had a difficult time keeping eyes open during conversation      General Comments General comments (skin integrity, edema, etc.): Pt very lethargic and difficulty keeping engaged in treatment. Pt fell asleep while performing therex    Exercises Total Joint Exercises Ankle Circles/Pumps: AROM;Both;10 reps;Seated Quad Sets: AROM;Both;10 reps;Seated   Assessment/Plan    PT Assessment Patient needs continued PT services  PT Problem List Decreased strength;Decreased range of motion;Decreased activity tolerance;Decreased mobility;Decreased knowledge of use of DME;Decreased safety awareness;Pain       PT Treatment Interventions DME instruction;Gait training;Functional mobility training;Therapeutic activities;Therapeutic exercise;Patient/family education    PT Goals (Current goals can be found in the Care Plan section)  Acute Rehab PT Goals Patient Stated Goal: Decreased pain PT Goal Formulation: With patient Time For Goal Achievement: 10/18/16 Potential to Achieve Goals: Good    Frequency 7X/week   Barriers to discharge Decreased caregiver support lives alone    Co-evaluation PT/OT/SLP Co-Evaluation/Treatment: Yes   PT goals addressed during session: Mobility/safety with mobility;Proper use of DME;Strengthening/ROM OT goals addressed during session: ADL's and self-care;Proper use of Adaptive equipment and DME       End of Session Equipment Utilized During Treatment: Right knee immobilizer Activity Tolerance: Patient limited by lethargy;Patient limited by pain Patient left: in chair;with call bell/phone within reach;with nursing/sitter in room Nurse Communication: Mobility status;Patient requests pain meds PT Visit Diagnosis: Other abnormalities of gait and mobility (R26.89);Pain;Muscle weakness (generalized) (M62.81) Pain - Right/Left: Right Pain -  part of body: Knee    Time: 0626-9485 PT Time Calculation (min) (ACUTE ONLY): 24 min   Charges:   PT Evaluation $PT Eval Low Complexity: 1 Procedure     PT G Codes:        Lucrezia Dehne B. Migdalia Dk PT, DPT Acute Rehabilitation  [phone] Pager [phone]    [place] 10/11/2016, 10:30 AM

## 2016-10-11 NOTE — Progress Notes (Signed)
   Subjective: 1 Day Post-Op Procedure(s) (LRB): TOTAL KNEE ARTHROPLASTY (Right) Patient reports pain as moderate.    Objective: Vital signs in last 24 hours: Temp:  [97 F (36.1 C)-99 F (37.2 C)] 98.6 F (37 C) (04/10 0500) Pulse Rate:  [56-95] 95 (04/10 0500) Resp:  [13-21] 18 (04/10 0500) BP: (111-155)/(68-91) 155/68 (04/10 0500) SpO2:  [94 %-100 %] 100 % (04/10 0500) Weight:  [249 lb (112.9 kg)] 249 lb (112.9 kg) (04/09 1027)  Intake/Output from previous day: 04/09 0701 - 04/10 0700 In: 1908 [P.O.:240; I.V.:1668] Out: 670 [Urine:650; Blood:20] Intake/Output this shift: No intake/output data recorded.   Recent Labs  10/11/16 0534  HGB 12.5*    Recent Labs  10/11/16 0534  WBC 11.8*  RBC 3.88*  HCT 37.4*  PLT 228    Recent Labs  10/11/16 0534  NA 139  K 3.4*  CL 101  CO2 28  BUN 8  CREATININE 1.06  GLUCOSE 127*  CALCIUM 9.1   No results for input(s): LABPT, INR in the last 72 hours.  Neurologically intact Dg Knee 1-2 Views Right  Result Date: 10/10/2016 CLINICAL DATA:  61 year old male post total knee replacement. Initial encounter. EXAM: RIGHT KNEE - 1-2 VIEW COMPARISON:  09/05/2016. FINDINGS: Post total right knee replacement which appears in satisfactory position without complication noted. Resurfacing of the patella. IMPRESSION: Post total right knee replacement without complication noted. Electronically Signed   By: Genia Del M.D.   On: 10/10/2016 17:50    Assessment/Plan: 1 Day Post-Op Procedure(s) (LRB): TOTAL KNEE ARTHROPLASTY (Right) Up with therapy dressing change. Went to SNF after THA, lives alone. Will need SNF  Marybelle Killings 10/11/2016, 7:35 AM

## 2016-10-11 NOTE — Progress Notes (Signed)
Dressing to right knee changed per physician order. Incision well approximated with steri strips intact. NO drainage noted. Aquacel dressing applied with ace wrap to follow.

## 2016-10-11 NOTE — Progress Notes (Signed)
Orthopedic Tech Progress Note Patient Details:  Peter Manning 1955-09-09 [id number] On cpm at 1930 Patient ID: [name], male   DOB: December 13, 1955, 61 y.o.   MRN: [id number]   [name] 10/11/2016, 7:29 PM

## 2016-10-11 NOTE — NC FL2 (Signed)
[place] LEVEL OF CARE SCREENING TOOL     IDENTIFICATION  Patient Name: Peter Manning Birthdate: October 16, 1955 Sex: male Admission Date (Current Location): 10/10/2016  [facility] and [place] Number:  Herbalist and Address:  [facility], [place] [address], [place], [place] [address]      Provider Number: [id number]  Attending Physician Name and Address:  Marybelle Killings, MD  Relative Name and Phone Number:       Current Level of Care: Hospital Recommended Level of Care: [place] Prior Approval Number:    Date Approved/Denied:   PASRR Number: [id number]  Discharge Plan: SNF    Current Diagnoses: Patient Active Problem List   Diagnosis Date Noted  . Right knee DJD 10/10/2016  . 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 OSA (obstructive sleep apnea) 12/09/2011  . Chronic bronchitis ([place]) 12/09/2011  . Tobacco abuse 12/09/2011    Orientation RESPIRATION BLADDER Height & Weight     Self, Time, Situation, Place  Normal Continent Weight: 249 lb (112.9 kg) Height:     BEHAVIORAL SYMPTOMS/MOOD NEUROLOGICAL BOWEL NUTRITION STATUS  Other (Comment) (normal/no behaviors)   Continent Diet (see DC summary)  AMBULATORY STATUS COMMUNICATION OF NEEDS Skin   Limited Assist Verbally Surgical wounds, Skin abrasions, Bruising                       Personal Care Assistance Level of Assistance  Bathing, Feeding, Dressing Bathing Assistance: Limited assistance Feeding assistance: Limited assistance Dressing Assistance: Limited assistance     Functional Limitations Info  Sight, Hearing, Speech Sight Info: Adequate Hearing Info: Adequate Speech Info: Adequate    SPECIAL CARE FACTORS FREQUENCY  PT (By licensed PT), OT (By licensed OT)     PT Frequency: 5x OT Frequency: 5x            Contractures Contractures Info: Not present    Additional Factors Info  Code Status, Allergies Code Status Info: Full  Code Allergies Info: NKA           Current Medications (10/11/2016):  This is the current hospital active medication list Current Facility-Administered Medications  Medication Dose Route Frequency Provider Last Rate Last Dose  . 0.9 %  sodium chloride infusion   Intravenous Continuous Lanae Crumbly, PA-C 90 mL/hr at 10/11/16 0517    . acetaminophen (TYLENOL) tablet 650 mg  650 mg Oral Q6H PRN Lanae Crumbly, PA-C   650 mg at 10/10/16 2207   Or  . acetaminophen (TYLENOL) suppository 650 mg  650 mg Rectal Q6H PRN Lanae Crumbly, PA-C      . albuterol (PROVENTIL) (2.5 MG/3ML) 0.083% nebulizer solution 3 mL  3 mL Inhalation Q6H PRN Lanae Crumbly, PA-C      . amLODipine (NORVASC) tablet 10 mg  10 mg Oral Daily Lanae Crumbly, PA-C   10 mg at 10/11/16 0951  . aspirin EC tablet 325 mg  325 mg Oral Q breakfast Lanae Crumbly, PA-C   325 mg at 10/11/16 0957  . docusate sodium (COLACE) capsule 100 mg  100 mg Oral BID Lanae Crumbly, PA-C   100 mg at 10/11/16 8270  . finasteride (PROSCAR) tablet 5 mg  5 mg Oral Daily Lanae Crumbly, PA-C   5 mg at 10/11/16 0951  . HYDROmorphone (DILAUDID) injection 1 mg  1 mg Intravenous Q3H PRN Lanae Crumbly, PA-C   1 mg at 10/11/16 0945  .  lactated ringers infusion   Intravenous Continuous Oleta Mouse, MD 10 mL/hr at 10/10/16 1402    . menthol-cetylpyridinium (CEPACOL) lozenge 3 mg  1 lozenge Oral PRN Lanae Crumbly, PA-C       Or  . phenol (CHLORASEPTIC) mouth spray 1 spray  1 spray Mouth/Throat PRN Lanae Crumbly, PA-C      . methocarbamol (ROBAXIN) tablet 500 mg  500 mg Oral Q6H PRN Lanae Crumbly, PA-C   500 mg at 10/11/16 6606   Or  . methocarbamol (ROBAXIN) 500 mg in dextrose 5 % 50 mL IVPB  500 mg Intravenous Q6H PRN Lanae Crumbly, PA-C      . metoCLOPramide (REGLAN) tablet 5-10 mg  5-10 mg Oral Q8H PRN Lanae Crumbly, PA-C       Or  . metoCLOPramide (REGLAN) injection 5-10 mg  5-10 mg Intravenous Q8H PRN Lanae Crumbly, PA-C      . ondansetron [facility]) tablet  4 mg  4 mg Oral Q6H PRN Lanae Crumbly, PA-C       Or  . ondansetron [facility]) injection 4 mg  4 mg Intravenous Q6H PRN Lanae Crumbly, PA-C      . oxyCODONE (Oxy IR/ROXICODONE) immediate release tablet 5-10 mg  5-10 mg Oral Q4H PRN Lanae Crumbly, PA-C   10 mg at 10/11/16 0416  . polyethylene glycol (MIRALAX / GLYCOLAX) packet 17 g  17 g Oral Daily PRN Lanae Crumbly, PA-C         Discharge Medications: Please see discharge summary for a list of discharge medications.  Relevant Imaging Results:  Relevant Lab Results:   Additional Information SSN: [id number]  [place], LCSW

## 2016-10-12 LAB — CBC
HEMATOCRIT: 35.2 % — AB (ref 39.0–52.0)
HEMOGLOBIN: 11.9 g/dL — AB (ref 13.0–17.0)
MCH: 32.3 pg (ref 26.0–34.0)
MCHC: 33.8 g/dL (ref 30.0–36.0)
MCV: 95.7 fL (ref 78.0–100.0)
PLATELETS: 216 10*3/uL (ref 150–400)
RBC: 3.68 MIL/uL — ABNORMAL LOW (ref 4.22–5.81)
RDW: 12.7 % (ref 11.5–15.5)
WBC: 14.3 10*3/uL — AB (ref 4.0–10.5)

## 2016-10-12 MED ORDER — OXYCODONE-ACETAMINOPHEN 10-325 MG PO TABS: 1.0000 | ORAL_TABLET | ORAL | 0 refills | Status: DC | PRN

## 2016-10-12 MED ORDER — METHOCARBAMOL 500 MG PO TABS: 500.0000 mg | ORAL_TABLET | Freq: Four times a day (QID) | ORAL | 0 refills | Status: DC | PRN

## 2016-10-12 MED ORDER — ASPIRIN 325 MG PO TBEC: 325.0000 mg | DELAYED_RELEASE_TABLET | Freq: Every day | ORAL | 0 refills | Status: DC

## 2016-10-12 MED ORDER — POLYETHYLENE GLYCOL 3350 17 G PO PACK: 17.0000 g | PACK | Freq: Every day | ORAL | 0 refills | Status: DC | PRN

## 2016-10-12 NOTE — Progress Notes (Signed)
Report called to [facility] and rehab. All personal belongings with pt. Pt voices no c/o pain or discomfort prior to transport. Knee immobilizer to right leg. Ace wrap in place under knee immobilizer.

## 2016-10-12 NOTE — Discharge Summary (Signed)
Patient ID: Peter Manning MRN: [id number] DOB/AGE: 04-02-1956 61 y.o.  Admit date: 10/10/2016 Discharge date: 10/12/2016  Admission Diagnoses:  Active Problems:   Right knee DJD   Discharge Diagnoses:  Active Problems:   Right knee DJD  status post Procedure(s): TOTAL KNEE ARTHROPLASTY  Past Medical History:  Diagnosis Date  . Arthritis   . Bronchitis   . Hepatitis 1976   pt. doesn't remember if A, B, C?  . History of kidney stones   . Hyperlipidemia   . Hypertension   . OSA (obstructive sleep apnea) 12/09/2011   PSG 01/03/12>>AHI 45.7, SpO2 low 73%, PLMI 0.  CPAP 15 cm H2O>>AHI 0, +R.   Marland Kitchen Pneumonia 2015  . Sleep apnea     Surgeries: Procedure(s): TOTAL KNEE ARTHROPLASTY on 10/10/2016   Consultants:   Discharged Condition: Improved  Hospital Course: Peter Manning is an 61 y.o. male who was admitted 10/10/2016 for operative treatment of right knee end stage DJD. Patient failed conservative treatments (please see the history and physical for the specifics) and had severe unremitting pain that affects sleep, daily activities and work/hobbies. After pre-op clearance, the patient was taken to the operating room on 10/10/2016 and underwent  Procedure(s): TOTAL KNEE ARTHROPLASTY.    Patient was given perioperative antibiotics: Anti-infectives    Start     Dose/Rate Route Frequency Ordered Stop   10/10/16 1030  ceFAZolin (ANCEF) 2-4 GM/100ML-% IVPB    Comments:  Jones, Tomika   : cabinet override      10/10/16 1030 10/10/16 2244   10/07/16 0700  ceFAZolin (ANCEF) IVPB 2g/100 mL premix     2 g 200 mL/hr over 30 Minutes Intravenous To [facility] 10/06/16 0949 10/08/16 0700       Patient was given sequential compression devices and early ambulation to prevent DVT.   Patient benefited maximally from hospital stay and there were no complications. At the time of discharge, the patient was urinating/moving their bowels without difficulty, tolerating a regular diet, pain is  controlled with oral pain medications and they have been cleared by PT/OT.   Recent vital signs: Patient Vitals for the past 24 hrs:  BP Temp Temp src Pulse Resp SpO2  10/12/16 0600 - 99 F (37.2 C) Oral - - -  10/12/16 0535 - 100.2 F (37.9 C) Oral - - -  10/12/16 0423 136/71 (!) 100.6 F (38.1 C) Oral 93 19 96 %  10/11/16 2224 118/66 100.3 F (37.9 C) Oral 99 20 97 %  10/11/16 1500 (!) 173/86 98.7 F (37.1 C) Oral 85 19 95 %     Recent laboratory studies:  Recent Labs  10/11/16 0534 10/12/16 0527  WBC 11.8* 14.3*  HGB 12.5* 11.9*  HCT 37.4* 35.2*  PLT 228 216  NA 139  --   K 3.4*  --   CL 101  --   CO2 28  --   BUN 8  --   CREATININE 1.06  --   GLUCOSE 127*  --   CALCIUM 9.1  --      Discharge Medications:   Allergies as of 10/12/2016      Reactions   No Known Allergies       Medication List    TAKE these medications   albuterol 108 (90 Base) MCG/ACT inhaler Commonly known as:  PROVENTIL HFA;VENTOLIN HFA Inhale 1-2 puffs into the lungs every 6 (six) hours as needed for wheezing or shortness of breath.   amLODipine 10 MG tablet Commonly known  as:  NORVASC Take 10 mg by mouth daily.   aspirin 325 MG EC tablet Take 1 tablet (325 mg total) by mouth daily with breakfast.   finasteride 5 MG tablet Commonly known as:  PROSCAR Take 5 mg by mouth daily.   methocarbamol 500 MG tablet Commonly known as:  ROBAXIN Take 1 tablet (500 mg total) by mouth every 6 (six) hours as needed for muscle spasms.   oxyCODONE-acetaminophen 10-325 MG tablet Commonly known as:  PERCOCET Take 1 tablet by mouth every 4 (four) hours as needed for pain. scheduled What changed:  when to take this  reasons to take this  additional instructions   polyethylene glycol packet Commonly known as:  MIRALAX / GLYCOLAX Take 17 g by mouth daily as needed for mild constipation.       Diagnostic Studies: Dg Chest 2 View  Result Date: 09/29/2016 CLINICAL DATA:  Preoperative exam  prior to right total knee replacement. Current smoker. Patient is reporting cough. EXAM: CHEST  2 VIEW COMPARISON:  Chest x-ray dated Nov 05, 2015 FINDINGS: The lungs are well-expanded. There is no focal infiltrate. There is no pleural effusion. The heart and pulmonary vascularity are normal. The mediastinum is normal in width. The trachea is midline. The bony thorax exhibits no acute abnormality. There is mild multilevel degenerative disc disease of the thoracic spine. IMPRESSION: There is no acute cardiopulmonary abnormality. Electronically Signed   By: David  Martinique M.D.   On: 09/29/2016 13:52   Dg Knee 1-2 Views Right  Result Date: 10/10/2016 CLINICAL DATA:  61 year old male post total knee replacement. Initial encounter. EXAM: RIGHT KNEE - 1-2 VIEW COMPARISON:  09/05/2016. FINDINGS: Post total right knee replacement which appears in satisfactory position without complication noted. Resurfacing of the patella. IMPRESSION: Post total right knee replacement without complication noted. Electronically Signed   By: Genia Del M.D.   On: 10/10/2016 17:50       Contact information for follow-up providers    Marybelle Killings, MD. Schedule an appointment as soon as possible for a visit today.   Specialty:  Orthopedic Surgery Why:  need return office visit 2 weeks postop.  Contact information: [place] [address] [phone]            Contact information for after-discharge care    [facility] Follow up.   Specialty:  [place] information: [address] [place] [phone]                  Discharge Plan:  discharge to snf      Signed: Benjiman Core for Dr. Rodell Perna [facility] 10/12/2016, 8:53 AM

## 2016-10-12 NOTE — Clinical Social Work Note (Signed)
Clinical Social Worker facilitated patient discharge including contacting patient family and facility to confirm patient discharge plans.  Clinical information faxed to facility and family agreeable with plan.  CSW arranged ambulance transport via [facility] to [facility].  RN to call [phone] for report prior to discharge.  Clinical Social Worker will sign off for now as social work intervention is no longer needed. Please consult us again if new need arises.  Darden Flemister [place], LCSWA [phone]   

## 2016-10-12 NOTE — Progress Notes (Signed)
Physical Therapy Treatment Patient Details Name: Peter Manning MRN: [id number] DOB: 10-04-1955 Today's Date: 10/12/2016    History of Present Illness pt presents post R TKA. pt with hx of OSA, Chronic Bronchitis, HTN, L THR, L TKR, and Back Surgery.    PT Comments    Pt is making slow progress towards his goals and continues to be limited by R knee pain. Pt bed mobility min A. Pt transfers with RW and ambulation of 15 feet with RW min A. Pt educated on need to get up with nursing for small bouts of movement to go to the bathroom and get out of the bed. Pt requires skilled PT to progress bed mobility, transfer and ambulation training as well as to improve LE strength and ROM to safely navigate his environment.    Follow Up Recommendations  SNF;Supervision/Assistance - 24 hour     Equipment Recommendations   (TBD at next venue)    Recommendations for Other Services OT consult     Precautions / Restrictions Precautions Precautions: Knee Precaution Booklet Issued: Yes (comment) Precaution Comments: Wore knee immobilizer and educated on importance of resting with knee extended Required Braces or Orthoses: Knee Immobilizer - Right Knee Immobilizer - Right: On at all times Restrictions Weight Bearing Restrictions: Yes RLE Weight Bearing: Weight bearing as tolerated    Mobility  Bed Mobility Overal bed mobility: Needs Assistance Bed Mobility: Supine to Sit     Supine to sit: Min assist     General bed mobility comments: Increased time and required min A to bring trunk to upright to get R LE on ground  Transfers Overall transfer level: Needs assistance Equipment used: Rolling walker (2 wheeled) Transfers: Sit to/from Stand Sit to Stand: Min assist         General transfer comment: Min A to steady at upright and VCs for hand placement to recliner. guiding to seated  Ambulation/Gait Ambulation/Gait assistance: Min assist Ambulation Distance (Feet): 15 Feet Assistive  device: Rolling walker (2 wheeled) Gait Pattern/deviations: Step-to pattern;Decreased step length - left;Decreased stance time - right;Decreased weight shift to right;Shuffle;Trunk flexed;Decreased dorsiflexion - right;Antalgic Gait velocity: slowed Gait velocity interpretation: Below normal speed for age/gender General Gait Details: pt with decreased ability to bear weight through R LE due to increased pain, max verbal cuing for increased weight bearing through UE and upright posture, no LoB, no buckling       Balance Overall balance assessment: Needs assistance Sitting-balance support: Feet supported;No upper extremity supported Sitting balance-Leahy Scale: Fair Sitting balance - Comments: pt unable to sit on R hip due to R knee pain Postural control: Left lateral lean Standing balance support: During functional activity;Bilateral upper extremity supported Standing balance-Leahy Scale: Fair Standing balance comment: Required RW for fucntional mobility.                            Cognition Arousal/Alertness: Lethargic Behavior During Therapy: Flat affect Overall Cognitive Status: Within Functional Limits for tasks assessed                                 General Comments: Pt states he didn't realize it would hurt this much      Exercises Total Joint Exercises Ankle Circles/Pumps: AROM;Both;10 reps;Seated Quad Sets: AROM;Both;Seated;Supine;20 reps Heel Slides: AROM;AAROM;5 reps;Supine Hip ABduction/ADduction: AROM;Right;10 reps;Standing Straight Leg Raises: AROM;10 reps;Right;Standing Goniometric ROM: in supine 10 to 38 degrees R knee  ROM    General Comments General comments (skin integrity, edema, etc.): Pt educated on need to get up to the bathroom and to take every opportunity to get out of bed and mobilize so that his knee and hip will move       Pertinent Vitals/Pain Pain Assessment: 0-10 Pain Score: 9  Pain Location: R knee Pain Descriptors  / Indicators: Constant;Discomfort;Grimacing;Guarding Pain Intervention(s): Limited activity within patient's tolerance;Monitored during session         VSS         PT Goals (current goals can now be found in the care plan section) Acute Rehab PT Goals Patient Stated Goal: Decreased pain PT Goal Formulation: With patient Time For Goal Achievement: 10/18/16 Potential to Achieve Goals: Good Progress towards PT goals: Progressing toward goals    Frequency    7X/week      PT Plan      Co-evaluation PT/OT/SLP Co-Evaluation/Treatment: Yes           End of Session Equipment Utilized During Treatment: Right knee immobilizer Activity Tolerance: Patient limited by lethargy;Patient limited by pain Patient left: in chair;with call bell/phone within reach;with family/visitor present Nurse Communication: Mobility status PT Visit Diagnosis: Other abnormalities of gait and mobility (R26.89);Pain;Muscle weakness (generalized) (M62.81) Pain - Right/Left: Right Pain - part of body: Knee     Time: 3016-0109 PT Time Calculation (min) (ACUTE ONLY): 22 min  Charges:  $Therapeutic Exercise: 8-22 mins                    G Codes:       Sharise Lippy B. Migdalia Dk PT, DPT Acute Rehabilitation  [phone] Pager [phone]     [place] 10/12/2016, 12:13 PM

## 2016-10-12 NOTE — Discharge Instructions (Signed)

## 2016-10-12 NOTE — Progress Notes (Signed)
Subjective: Doing well.  c/o right lower leg pain. States that ace bandage applied by RN was applied too tight.  Denies cp, sob.     Objective: Vital signs in last 24 hours: Temp:  [98.7 F (37.1 C)-100.6 F (38.1 C)] 99 F (37.2 C) (04/11 0600) Pulse Rate:  [85-99] 93 (04/11 0423) Resp:  [19-20] 19 (04/11 0423) BP: (118-173)/(66-86) 136/71 (04/11 0423) SpO2:  [95 %-97 %] 96 % (04/11 0423)  Intake/Output from previous day: 04/10 0701 - 04/11 0700 In: 960 [P.O.:960] Out: 2400 [Urine:2400] Intake/Output this shift: No intake/output data recorded.   Recent Labs  10/11/16 0534 10/12/16 0527  HGB 12.5* 11.9*    Recent Labs  10/11/16 0534 10/12/16 0527  WBC 11.8* 14.3*  RBC 3.88* 3.68*  HCT 37.4* 35.2*  PLT 228 216    Recent Labs  10/11/16 0534  NA 139  K 3.4*  CL 101  CO2 28  BUN 8  CREATININE 1.06  GLUCOSE 127*  CALCIUM 9.1   No results for input(s): LABPT, INR in the last 72 hours.  Exam:  Pleasant male alert and oriented.  NAD.  Ace bandage tightly applied was removed.  States that leg feels much better.  bilat calves nontender.  NVI.    Assessment/Plan: Awaiting snf placement.  Stable to discharge when bed available.  Scripts on chart for percocet, aspirin, and robaxin.  Ordered bilat ted hose to be applied today.  Encouraged incentive spirometry.    Peter Manning 10/12/2016, 9:07 AM

## 2016-10-18 ENCOUNTER — Encounter (HOSPITAL_COMMUNITY): Payer: Self-pay | Admitting: Emergency Medicine

## 2016-10-18 ENCOUNTER — Emergency Department (HOSPITAL_COMMUNITY)
Admission: EM | Admit: 2016-10-18 | Discharge: 2016-10-18 | Disposition: A | Discharge status: Home / Self Care | Payer: Medicare Other | Attending: Emergency Medicine | Admitting: Emergency Medicine

## 2016-10-18 DIAGNOSIS — Z7982 Long term (current) use of aspirin: Secondary | ICD-10-CM | POA: Diagnosis not present

## 2016-10-18 DIAGNOSIS — M25561 Pain in right knee: Secondary | ICD-10-CM | POA: Insufficient documentation

## 2016-10-18 DIAGNOSIS — G8918 Other acute postprocedural pain: Secondary | ICD-10-CM

## 2016-10-18 DIAGNOSIS — F17210 Nicotine dependence, cigarettes, uncomplicated: Secondary | ICD-10-CM | POA: Insufficient documentation

## 2016-10-18 DIAGNOSIS — Z96651 Presence of right artificial knee joint: Secondary | ICD-10-CM | POA: Insufficient documentation

## 2016-10-18 DIAGNOSIS — Z79899 Other long term (current) drug therapy: Secondary | ICD-10-CM | POA: Diagnosis not present

## 2016-10-18 DIAGNOSIS — I10 Essential (primary) hypertension: Secondary | ICD-10-CM | POA: Insufficient documentation

## 2016-10-18 MED ORDER — HYDROMORPHONE HCL 1 MG/ML IJ SOLN
1.0000 mg | Freq: Once | INTRAMUSCULAR | Status: AC
  Administered 2016-10-18: 1 mg via INTRAMUSCULAR
  Filled 2016-10-18: qty 1

## 2016-10-18 NOTE — ED Notes (Signed)
CALLED [facility] FOR TRANSPORT BACK TO [facility]--NO SPECIAL EQUIPMENT--Peter Manning

## 2016-10-18 NOTE — Discharge Instructions (Signed)
Please take your pain medication as prescribed. Please follow-up with your orthopedic surgeon tomorrow. Please have your nursing home contact the orthopedic office in the morning to get an order for the CPM machine. I think that this will significantly help with your mobility, pain and swelling. You may apply ice to your leg several times a day and keep it elevated above the level of your heart to help with pain control and swelling. There is no sign of infection on exam. If you develop fever of 100.4 higher, bleeding that won't stop after direct pressure for 30 minutes, drainage of pus from your incision site, your leg is red, warm, you began having pain in your calf, numbness or tingling in the leg, leg is cold or blue, please return to the emergency department.

## 2016-10-18 NOTE — ED Notes (Signed)
Replaced steristrips and aquacell dressing, replicated previous dressing. Wound cleaned with mild soap and water. No drainage, wound clean dry and intact. Provided patient with discharge instructions.

## 2016-10-18 NOTE — ED Provider Notes (Addendum)
TIME SEEN: 5:01 AM  CHIEF COMPLAINT: Right-sided knee pain  HPI: Patient is a 61 year old male with history of hypertension, hyperlipidemia who underwent right knee replacement with Dr. Lorin Mercy on 10/07/16 for DJD.  Patient has been taking Percocet and Robaxin at [facility] where he was discharged to after his surgery. Reports that he feels like his leg is tight, difficult to flex and he is not getting enough pain control with his oral pain medications and he is not able to sleep well. No fever, warmth, erythema to the leg. No new injury. No calf swelling or tenderness. States he has not been using a CPM at the nursing home like he did in the hospital. He states he does try to keep it elevated and applies ice intermittently. He has been able to ambulate on it.  ROS: See HPI Constitutional: no fever  Eyes: no drainage  ENT: no runny nose   Cardiovascular:  no chest pain  Resp: no SOB  GI: no vomiting GU: no dysuria Integumentary: no rash  Allergy: no hives  Musculoskeletal: no leg swelling  Neurological: no slurred speech ROS otherwise negative  PAST MEDICAL HISTORY/PAST SURGICAL HISTORY:  Past Medical History:  Diagnosis Date  . Arthritis   . Bronchitis   . Hepatitis 1976   pt. doesn't remember if A, B, C?  . History of kidney stones   . Hyperlipidemia   . Hypertension   . OSA (obstructive sleep apnea) 12/09/2011   PSG 01/03/12>>AHI 45.7, SpO2 low 73%, PLMI 0.  CPAP 15 cm H2O>>AHI 0, +R.   Marland Kitchen Pneumonia 2015  . Sleep apnea     MEDICATIONS:  Prior to Admission medications   Medication Sig Start Date End Date Taking? Authorizing Provider  albuterol (PROVENTIL HFA;VENTOLIN HFA) 108 (90 BASE) MCG/ACT inhaler Inhale 1-2 puffs into the lungs every 6 (six) hours as needed for wheezing or shortness of breath. 04/17/14   Heather Laisure, PA-C  amLODipine (NORVASC) 10 MG tablet Take 10 mg by mouth daily.  08/24/16   Historical Provider, MD  aspirin EC 325 MG EC tablet Take 1 tablet  (325 mg total) by mouth daily with breakfast. 10/12/16   Lanae Crumbly, PA-C  finasteride (PROSCAR) 5 MG tablet Take 5 mg by mouth daily. 09/23/16   Historical Provider, MD  methocarbamol (ROBAXIN) 500 MG tablet Take 1 tablet (500 mg total) by mouth every 6 (six) hours as needed for muscle spasms. 10/12/16   Lanae Crumbly, PA-C  oxyCODONE-acetaminophen (PERCOCET) 10-325 MG tablet Take 1 tablet by mouth every 4 (four) hours as needed for pain. scheduled 10/12/16   Lanae Crumbly, PA-C  polyethylene glycol [facility] Floria Raveling) packet Take 17 g by mouth daily as needed for mild constipation. 10/12/16   Lanae Crumbly, PA-C    ALLERGIES:  Allergies  Allergen Reactions  . No Known Allergies     SOCIAL HISTORY:  Social History  Substance Use Topics  . Smoking status: Current Some Day Smoker    Packs/day: 0.50    Years: 39.00    Types: Cigarettes  . Smokeless tobacco: Never Used     Comment: states on and off smoker  . Alcohol use No    FAMILY HISTORY: Family History  Problem Relation Age of Onset  . Heart attack Father   . Cancer Mother     EXAM: BP 138/86 (BP Location: Right Arm)   Pulse 73   Temp 98.3 F (36.8 C) (Oral)   Resp 18  Ht 5\' 8"  (1.727 m)   Wt 242 lb (109.8 kg)   SpO2 95%   BMI 36.80 kg/m  CONSTITUTIONAL: Alert and oriented and responds appropriately to questions. Well-appearing; well-nourished HEAD: Normocephalic EYES: Conjunctivae clear, pupils appear equal, EOMI ENT: normal nose; moist mucous membranes NECK: Supple, no meningismus, no nuchal rigidity, no LAD  CARD: RRR; S1 and S2 appreciated; no murmurs, no clicks, no rubs, no gallops RESP: Normal chest excursion without splinting or tachypnea; breath sounds clear and equal bilaterally; no wheezes, no rhonchi, no rales, no hypoxia or respiratory distress, speaking full sentences ABD/GI: Normal bowel sounds; non-distended; soft, non-tender, no rebound, no guarding, no peritoneal signs, no hepatosplenomegaly BACK:   The back appears normal and is non-tender to palpation, there is no CVA tenderness EXT: Patient status post right knee replacement. He does have a moderate joint effusion in the right knee. Surgical incision is intact without any drainage or bleeding. There is no significant warmth noted at all to this lag or erythema. There is no fluctuance or induration. He is able to flex the knee to approximately 50 and can fully extend it. He has no calf tenderness or swelling on either leg. He has 2+ DP pulses bilaterally. Normal sensation throughout both legs. Otherwise Normal ROM in all joints; otherwise extremities are non-tender to palpation; no edema; normal capillary refill; no cyanosis, no signs of compartment syndrome, compartment are also off, extremity is warm and well-perfused SKIN: Normal color for age and race; warm; no rash NEURO: Moves all extremities equally PSYCH: The patient's mood and manner are appropriate. Grooming and personal hygiene are appropriate.  MEDICAL DECISION MAKING: Patient here with uncontrolled pain after her knee replacement. Patient reports that the knee is swollen and tight. I think this is likely because he is not using a CPM at his nursing facility. He has an appointment with Dr. Lorin Mercy tomorrow. He states he can call Dr. Lorin Mercy office today to see if they can set him up with a CPM at [facility]. This is not something that I can arrange in the middle of the night from the emergency department. Patient feels comfortable managing this himself. There is absolutely no sign of any type of infection on exam, compartment syndrome, DVT. His extremity is warm and well-perfused. Discussed with patient if he is not keeping the leg elevated, applying ice or doing his CPM/therapy as instructed that it is expected that he would have swelling in this joint and increased pain because of it. Will give dose of IM Dilaudid here for pain control but I think he can be discharged back to his  nursing facility.  Will replace dressing on patient's leg prior to discharge as it was removed for physical examination.  ED PROGRESS: I called nursing home at [facility] and they state he didn't come with a CPM or have orders for one. They will call Dr. Narda Amber office in the morning to get an order for this.  Patient's nurse told patient that she thought that this was the reason he was having increasing swelling and pain as well but she states he insisted on coming to the emergency department. They state that they can arrange to get this taken care of in the morning when the orthopedic offices open.   At this time, I do not feel there is any life-threatening condition present. I have reviewed and discussed all results (EKG, imaging, lab, urine as appropriate) and exam findings with patient/family. I have reviewed nursing notes and appropriate  previous records.  I feel the patient is safe to be discharged home without further emergent workup and can continue workup as an outpatient as needed. Discussed usual and customary return precautions. Patient/family verbalize understanding and are comfortable with this plan.  Outpatient follow-up has been provided if needed. All questions have been answered.      [place], DO 10/18/16 [place], DO 10/18/16 [id number]

## 2016-10-18 NOTE — ED Notes (Signed)
MD at bedside. 

## 2016-10-18 NOTE — ED Triage Notes (Signed)
Pt arrives via EMS from [facility], post op knee replacement on 10/07/16. States continued pain "tightness" and decreased mobility. States his friend who had the same procedure can bend his leg. PO percocet 10-325MG , robaxin 750MG  not effective. Appointment with Dr. Lorin Mercy (who performed procedure) today but he could not wait. CMS intact, good pulses, minimal swelling, pt is moving his leg without difficulty. Putting weight on R foot without difficulty.

## 2016-10-19 ENCOUNTER — Ambulatory Visit (INDEPENDENT_AMBULATORY_CARE_PROVIDER_SITE_OTHER): Payer: Medicare Other | Admitting: Orthopaedic Surgery

## 2016-10-19 ENCOUNTER — Encounter (INDEPENDENT_AMBULATORY_CARE_PROVIDER_SITE_OTHER): Payer: Self-pay | Admitting: Orthopaedic Surgery

## 2016-10-19 ENCOUNTER — Ambulatory Visit (INDEPENDENT_AMBULATORY_CARE_PROVIDER_SITE_OTHER): Payer: Medicare Other

## 2016-10-19 VITALS — BP 127/72 | HR 73 | Ht 68.0 in

## 2016-10-19 DIAGNOSIS — M1711 Unilateral primary osteoarthritis, right knee: Secondary | ICD-10-CM

## 2016-10-19 NOTE — Progress Notes (Signed)
   Post-Op Visit Note   Patient: Peter Manning           Date of Birth: [birth year]           MRN: [id number] Visit Date: 10/19/2016 PCP: Philis Fendt, MD   Assessment & Plan:  Chief Complaint:  Chief Complaint  Patient presents with  . Right Knee - Routine Post Op   Visit Diagnoses:  1. Unilateral primary osteoarthritis, right knee    She states that he had to call and get transported from the skilled facility to the emergency room to get more pain medication. He was on Percocet 10/325 every 4 hours and afterwards the emergency room he was given a prescription for MS Contin 15 mg at bedtime. There are also orders for Percocet 5 mg and also Percocet 7.5 mg in his [facility] orders. Patient is somewhat the sedated today. He wasn't answering questions. Patient has hemarthrosis in his knee looks like he is just on aspirin for DVT prophylaxis. swelling. Plan:  As aspirated today 45 mL dark blood. This should help his pain and help with his narcotic medication. Return 4 weeks.  Follow-Up Instructions: Return in about 4 weeks (around 11/16/2016).   Orders:  Orders Placed This Encounter  Procedures  . XR Knee 1-2 Views Right   No orders of the defined types were placed in this encounter.   Imaging: Xr Knee 1-2 Views Right  Result Date: 10/19/2016 Standing AP both knees lateral right knee obtained. These show satisfactory postop total knee arthroplasty with good position and alignment. Impression: Satisfactory postop total knee arthroplasty, right.   PMFS History: Patient Active Problem List   Diagnosis Date Noted  . Right knee DJD 10/10/2016  . 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 OSA (obstructive sleep apnea) 12/09/2011  . Chronic bronchitis ([place]) 12/09/2011  . Tobacco abuse 12/09/2011   Past Medical History:  Diagnosis Date  . Arthritis   . Bronchitis   . Hepatitis 1976   pt. doesn't remember if A, B, C?  . History of kidney stones   .  Hyperlipidemia   . Hypertension   . OSA (obstructive sleep apnea) 12/09/2011   PSG 01/03/12>>AHI 45.7, SpO2 low 73%, PLMI 0.  CPAP 15 cm H2O>>AHI 0, +R.   Marland Kitchen Pneumonia 2015  . Sleep apnea     Family History  Problem Relation Age of Onset  . Heart attack Father   . Cancer Mother     Past Surgical History:  Procedure Laterality Date  . BACK SURGERY    . JOINT REPLACEMENT    . REVISION TOTAL HIP ARTHROPLASTY Left 11/2015  . TOTAL HIP ARTHROPLASTY    . TOTAL HIP REVISION Left 11/18/2015   Procedure: LEFT TOTAL HIP REVISION;  Surgeon: Marybelle Killings, MD;  Location: [facility];  Service: Orthopedics;  Laterality: Left;  . TOTAL KNEE ARTHROPLASTY Right 10/10/2016   Procedure: TOTAL KNEE ARTHROPLASTY;  Surgeon: Marybelle Killings, MD;  Location: [facility];  Service: Orthopedics;  Laterality: Right;   Social History   Occupational History  . Not on file.   Social History Main Topics  . Smoking status: Current Some Day Smoker    Packs/day: 0.50    Years: 39.00    Types: Cigarettes  . Smokeless tobacco: Never Used     Comment: states on and off smoker  . Alcohol use No  . Drug use: No  . Sexual activity: Not on file

## 2016-11-02 ENCOUNTER — Encounter (HOSPITAL_COMMUNITY): Payer: Self-pay | Admitting: *Deleted

## 2016-11-02 ENCOUNTER — Emergency Department (HOSPITAL_COMMUNITY)
Admission: EM | Admit: 2016-11-02 | Discharge: 2016-11-02 | Disposition: A | Discharge status: Home / Self Care | Payer: Medicare Other | Attending: Emergency Medicine | Admitting: Emergency Medicine

## 2016-11-02 DIAGNOSIS — F17210 Nicotine dependence, cigarettes, uncomplicated: Secondary | ICD-10-CM | POA: Insufficient documentation

## 2016-11-02 DIAGNOSIS — Z79899 Other long term (current) drug therapy: Secondary | ICD-10-CM | POA: Insufficient documentation

## 2016-11-02 DIAGNOSIS — Z96642 Presence of left artificial hip joint: Secondary | ICD-10-CM | POA: Diagnosis not present

## 2016-11-02 DIAGNOSIS — M25561 Pain in right knee: Secondary | ICD-10-CM

## 2016-11-02 DIAGNOSIS — Z96652 Presence of left artificial knee joint: Secondary | ICD-10-CM | POA: Insufficient documentation

## 2016-11-02 LAB — CBC WITH DIFFERENTIAL/PLATELET
Basophils Absolute: 0 10*3/uL (ref 0.0–0.1)
Basophils Relative: 1 %
Eosinophils Absolute: 0.7 10*3/uL (ref 0.0–0.7)
Eosinophils Relative: 9 %
HEMATOCRIT: 37.9 % — AB (ref 39.0–52.0)
HEMOGLOBIN: 12.3 g/dL — AB (ref 13.0–17.0)
LYMPHS ABS: 1.9 10*3/uL (ref 0.7–4.0)
LYMPHS PCT: 24 %
MCH: 31 pg (ref 26.0–34.0)
MCHC: 32.5 g/dL (ref 30.0–36.0)
MCV: 95.5 fL (ref 78.0–100.0)
MONOS PCT: 4 %
Monocytes Absolute: 0.3 10*3/uL (ref 0.1–1.0)
NEUTROS ABS: 4.7 10*3/uL (ref 1.7–7.7)
NEUTROS PCT: 62 %
Platelets: 316 10*3/uL (ref 150–400)
RBC: 3.97 MIL/uL — ABNORMAL LOW (ref 4.22–5.81)
RDW: 13.1 % (ref 11.5–15.5)
WBC: 7.6 10*3/uL (ref 4.0–10.5)

## 2016-11-02 LAB — C-REACTIVE PROTEIN: CRP: <0.8 mg/dL (ref <1.0)

## 2016-11-02 LAB — SEDIMENTATION RATE: Sed Rate: 40 mm/hr — ABNORMAL HIGH (ref 0–16)

## 2016-11-02 MED ORDER — OXYCODONE-ACETAMINOPHEN 5-325 MG PO TABS: 2.0000 | ORAL_TABLET | ORAL | 0 refills | Status: DC | PRN

## 2016-11-02 NOTE — ED Notes (Signed)
Left voicemail for [place] @ [phone].

## 2016-11-02 NOTE — Discharge Instructions (Signed)
Call Dr. Lorin Mercy for recheck.

## 2016-11-02 NOTE — ED Triage Notes (Signed)
PT had a total knee replacement done on 4/6, completed rehab, then touch knee and pus came out.  Still swollen.  Palpable DPP

## 2016-11-02 NOTE — ED Provider Notes (Signed)
[facility] DEPT Provider Note   CSN: [id number] Arrival date & time: 11/02/16  4332     History   Chief Complaint Chief Complaint  Patient presents with  . Knee Pain    recent surgery and pus coming out of knee    HPI Peter Manning is a 61 y.o. male. Chief complaint is knee pain.  HPI:  This is a 61 year old male who underwent total knee arthroplasty with Dr. Lorin Mercy orthopedics on 4/6 of 2018. Head quite a bit of postoperative pain. Initially came in by cab from his care facility that he was at for rehabilitation stating that he was not getting enough pain medication. Seen by Dr. Lorin Mercy on the 18th and had arthrocentesis of his hemarthrosis. Presents today stating that he is now at home. He states there was some " green drainage" coming from the center of the knee at the wound this morning. He has not had fever. States it still hurts. Cannot tell if it is any better or worse.  Past Medical History:  Diagnosis Date  . Arthritis   . Bronchitis   . Hepatitis 1976   pt. doesn't remember if A, B, C?  . History of kidney stones   . Hyperlipidemia   . Hypertension   . OSA (obstructive sleep apnea) 12/09/2011   PSG 01/03/12>>AHI 45.7, SpO2 low 73%, PLMI 0.  CPAP 15 cm H2O>>AHI 0, +R.   Marland Kitchen Pneumonia 2015  . Sleep apnea     Patient Active Problem List   Diagnosis Date Noted  . Right knee DJD 10/10/2016  . 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 OSA (obstructive sleep apnea) 12/09/2011  . Chronic bronchitis ([place]) 12/09/2011  . Tobacco abuse 12/09/2011    Past Surgical History:  Procedure Laterality Date  . BACK SURGERY    . JOINT REPLACEMENT    . REVISION TOTAL HIP ARTHROPLASTY Left 11/2015  . TOTAL HIP ARTHROPLASTY    . TOTAL HIP REVISION Left 11/18/2015   Procedure: LEFT TOTAL HIP REVISION;  Surgeon: Marybelle Killings, MD;  Location: [facility];  Service: Orthopedics;  Laterality: Left;  . TOTAL KNEE ARTHROPLASTY Right 10/10/2016   Procedure: TOTAL KNEE ARTHROPLASTY;  Surgeon: Marybelle Killings, MD;  Location: [facility];  Service: Orthopedics;  Laterality: Right;       Home Medications    Prior to Admission medications   Medication Sig Start Date End Date Taking? Authorizing Provider  albuterol (PROVENTIL HFA;VENTOLIN HFA) 108 (90 BASE) MCG/ACT inhaler Inhale 1-2 puffs into the lungs every 6 (six) hours as needed for wheezing or shortness of breath. 04/17/14  Yes Heather Laisure, PA-C  amLODipine (NORVASC) 10 MG tablet Take 10 mg by mouth daily.  08/24/16  Yes Historical Provider, MD  aspirin EC 325 MG EC tablet Take 1 tablet (325 mg total) by mouth daily with breakfast. 10/12/16  Yes Lanae Crumbly, PA-C  finasteride (PROSCAR) 5 MG tablet Take 5 mg by mouth daily. 09/23/16  Yes Historical Provider, MD  methocarbamol (ROBAXIN) 500 MG tablet Take 1 tablet (500 mg total) by mouth every 6 (six) hours as needed for muscle spasms. 10/12/16  Yes Lanae Crumbly, PA-C  polyethylene glycol [facility] / GLYCOLAX) packet Take 17 g by mouth daily as needed for mild constipation. 10/12/16  Yes Lanae Crumbly, PA-C  oxyCODONE-acetaminophen (PERCOCET/ROXICET) 5-325 MG tablet Take 2 tablets by mouth every 4 (four) hours as needed. 11/02/16   Tanna Furry, MD    Family History Family History  Problem  Relation Age of Onset  . Heart attack Father   . Cancer Mother     Social History Social History  Substance Use Topics  . Smoking status: Current Some Day Smoker    Packs/day: 0.50    Years: 39.00    Types: Cigarettes  . Smokeless tobacco: Never Used     Comment: states on and off smoker  . Alcohol use No     Allergies   No known allergies   Review of Systems Review of Systems  Constitutional: Negative for appetite change, chills, diaphoresis, fatigue and fever.  HENT: Negative for mouth sores, sore throat and trouble swallowing.   Eyes: Negative for visual disturbance.  Respiratory: Negative for cough, chest tightness, shortness of breath and wheezing.   Cardiovascular: Negative for chest  pain.  Gastrointestinal: Negative for abdominal distention, abdominal pain, diarrhea, nausea and vomiting.  Endocrine: Negative for polydipsia, polyphagia and polyuria.  Genitourinary: Negative for dysuria, frequency and hematuria.  Musculoskeletal: Negative for gait problem.       Right knee pain and? Drainage  Skin: Negative for color change, pallor and rash.  Neurological: Negative for dizziness, syncope, light-headedness and headaches.  Hematological: Does not bruise/bleed easily.  Psychiatric/Behavioral: Negative for behavioral problems and confusion.     Physical Exam Updated Vital Signs BP 127/75   Pulse 71   Temp 98.2 F (36.8 C) (Oral)   Resp 17   Ht 5\' 8"  (1.727 m)   Wt 250 lb (113.4 kg)   SpO2 99%   BMI 38.01 kg/m   Physical Exam  Constitutional: He is oriented to person, place, and time. He appears well-developed and well-nourished. No distress.  HENT:  Head: Normocephalic.  Eyes: Conjunctivae are normal. Pupils are equal, round, and reactive to light. No scleral icterus.  Neck: Normal range of motion. Neck supple. No thyromegaly present.  Cardiovascular: Normal rate and regular rhythm.  Exam reveals no gallop and no friction rub.   No murmur heard. Pulmonary/Chest: Effort normal and breath sounds normal. No respiratory distress. He has no wheezes. He has no rales.  Abdominal: Soft. Bowel sounds are normal. He exhibits no distension. There is no tenderness. There is no rebound.  Musculoskeletal: Normal range of motion.  Right knee has a well-healed midline incision. There is no dehiscence. I cannot express any drainage from the wound. Is not erythematous around the wound and does not have any surrounding crepitus or fluctuance. He does have a palpable joint effusion and distention of the suprapatellar bursa.  Neurological: He is alert and oriented to person, place, and time.  Skin: Skin is warm and dry. No rash noted.  Psychiatric: He has a normal mood and affect.  His behavior is normal.     ED Treatments / Results  Labs (all labs ordered are listed, but only abnormal results are displayed) Labs Reviewed  SEDIMENTATION RATE - Abnormal; Notable for the following:       Result Value   Sed Rate 40 (*)    All other components within normal limits  CBC WITH DIFFERENTIAL/PLATELET - Abnormal; Notable for the following:    RBC 3.97 (*)    Hemoglobin 12.3 (*)    HCT 37.9 (*)    All other components within normal limits  C-REACTIVE PROTEIN    EKG  EKG Interpretation None       Radiology No results found.  Procedures Procedures (including critical care time)  Medications Ordered in ED Medications - No data to display   Initial Impression /  Assessment and Plan / ED Course  I have reviewed the triage vital signs and the nursing notes.  Pertinent labs & imaging results that were available during my care of the patient were reviewed by me and considered in my medical decision making (see chart for details).    I discussed the case with Dr. Lorin Mercy. He would like inflammatory markers CBC and a call back if elevated.  Final Clinical Impressions(s) / ED Diagnoses   Final diagnoses:  Right knee pain, unspecified chronicity    Normal WBC. Normal CRP. Sedimentation rate 40. Discussed with Dr. Lorin Mercy. He felt that with my description of this benign-appearing wound, and sedimentation rate of 40 and an immediate postoperative. This was appropriate. I do not have concern about this being obvious wound or joint infection at this time. Discussed with patient. Dr. Micheal Likens recommended limited out of pain medication and follow-up with him.  New Prescriptions New Prescriptions   OXYCODONE-ACETAMINOPHEN (PERCOCET/ROXICET) 5-325 MG TABLET    Take 2 tablets by mouth every 4 (four) hours as needed.     Tanna Furry, MD 11/02/16 [phone]

## 2016-11-04 ENCOUNTER — Ambulatory Visit (INDEPENDENT_AMBULATORY_CARE_PROVIDER_SITE_OTHER): Payer: Medicare Other

## 2016-11-04 ENCOUNTER — Telehealth (INDEPENDENT_AMBULATORY_CARE_PROVIDER_SITE_OTHER): Payer: Self-pay

## 2016-11-04 ENCOUNTER — Other Ambulatory Visit (INDEPENDENT_AMBULATORY_CARE_PROVIDER_SITE_OTHER): Payer: Self-pay | Admitting: Orthopaedic Surgery

## 2016-11-04 ENCOUNTER — Encounter (INDEPENDENT_AMBULATORY_CARE_PROVIDER_SITE_OTHER): Payer: Self-pay | Admitting: Orthopaedic Surgery

## 2016-11-04 ENCOUNTER — Ambulatory Visit (INDEPENDENT_AMBULATORY_CARE_PROVIDER_SITE_OTHER): Payer: Medicare Other | Admitting: Orthopaedic Surgery

## 2016-11-04 VITALS — BP 129/81 | HR 85 | Ht 68.0 in

## 2016-11-04 DIAGNOSIS — Z96651 Presence of right artificial knee joint: Secondary | ICD-10-CM | POA: Diagnosis not present

## 2016-11-04 MED ORDER — LIDOCAINE HCL 1 % IJ SOLN
1.0000 mL | INTRAMUSCULAR | Status: AC | PRN
  Administered 2016-11-04: 1 mL

## 2016-11-04 NOTE — Telephone Encounter (Signed)
Patient would like a Rx refill on Percocet 7.5-325mg .  CB# is [phone].  Please Advise.  Thank You.

## 2016-11-04 NOTE — Telephone Encounter (Signed)
Please advise 

## 2016-11-04 NOTE — Progress Notes (Signed)
Office Visit Note   Patient: Peter Manning           Date of Birth: 1955-12-29           MRN: [id number] Visit Date: 11/04/2016              Requested by: Nolene Ebbs, MD [address] [place], [place] [address] PCP: Philis Fendt, MD   Assessment & Plan: Visit Diagnoses:  1. S/P total knee arthroplasty, right     Plan: Return visit 3 weeks to check his the extension. X-rays today showed good position and alignment.  Follow-Up Instructions: No Follow-up on file.   Orders:  Orders Placed This Encounter  Procedures  . XR Knee 1-2 Views Right   No orders of the defined types were placed in this encounter.     Procedures: Large Joint Inj Date/Time: 11/04/2016 1:51 PM Performed by: Marybelle Killings Authorized by: Rodell Perna C   Consent Given by:  Patient Indications:  Pain and joint swelling Location:  Knee Site:  R knee Needle Size:  22 G Needle Length:  1.5 inches Approach:  Anterolateral Ultrasound Guidance: No   Fluoroscopic Guidance: No   Arthrogram: No   Medications:  1 mL lidocaine 1 % Aspiration Attempted: Yes   Aspirate amount (mL):  60 Aspirate:  Blood-tinged Patient tolerance:  Patient tolerated the procedure well with no immediate complications     Clinical Data: No additional findings.   Subjective: Chief Complaint  Patient presents with  . Right Knee - Pain    HPI patient returns he's out of the skilled nursing facility was at home getting therapy at home. He had one episode when he went to the emergency room for additional pain medication. He is normally on 3 Percocet 10/325 a day under chronic pain management. No new pain medication was given today. Aspirated 60 mL of serosanguineous fluid from his knee he lacks 15 reaching full extension we discussed prone positioning as well as keeping his heel sitting on a chair some to help his knee out to full extension as it did right after the surgery. He's flexing the 100. Outpatient PT  prescription given return 3 weeks.  Review of Systems updated 14 point and unchanged from surgery.   Objective: Vital Signs: BP 129/81   Pulse 85   Ht 5\' 8"  (1.727 m)   Physical Exam patient has an effusion of his knee. By palpation temperature is normal incision is well-healed there is no drainage. Pulses are intact with a primary of motion. Flexion 110.  Ortho Exam  Specialty Comments:  No specialty comments available.  Imaging: No results found.   PMFS History: Patient Active Problem List   Diagnosis Date Noted  . Right knee DJD 10/10/2016  . 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 OSA (obstructive sleep apnea) 12/09/2011  . Chronic bronchitis ([place]) 12/09/2011  . Tobacco abuse 12/09/2011   Past Medical History:  Diagnosis Date  . Arthritis   . Bronchitis   . Hepatitis 1976   pt. doesn't remember if A, B, C?  . History of kidney stones   . Hyperlipidemia   . Hypertension   . OSA (obstructive sleep apnea) 12/09/2011   PSG 01/03/12>>AHI 45.7, SpO2 low 73%, PLMI 0.  CPAP 15 cm H2O>>AHI 0, +R.   Marland Kitchen Pneumonia 2015  . Sleep apnea     Family History  Problem Relation Age of Onset  . Heart attack Father   . Cancer Mother  Past Surgical History:  Procedure Laterality Date  . BACK SURGERY    . JOINT REPLACEMENT    . REVISION TOTAL HIP ARTHROPLASTY Left 11/2015  . TOTAL HIP ARTHROPLASTY    . TOTAL HIP REVISION Left 11/18/2015   Procedure: LEFT TOTAL HIP REVISION;  Surgeon: Marybelle Killings, MD;  Location: [facility];  Service: Orthopedics;  Laterality: Left;  . TOTAL KNEE ARTHROPLASTY Right 10/10/2016   Procedure: TOTAL KNEE ARTHROPLASTY;  Surgeon: Marybelle Killings, MD;  Location: [facility];  Service: Orthopedics;  Laterality: Right;   Social History   Occupational History  . Not on file.   Social History Main Topics  . Smoking status: Current Some Day Smoker    Packs/day: 0.50    Years: 39.00    Types: Cigarettes  . Smokeless tobacco: Never Used     Comment: states on and  off smoker  . Alcohol use No  . Drug use: No  . Sexual activity: Not on file

## 2016-11-04 NOTE — Telephone Encounter (Signed)
SORRY DENIED.GETS 90 OF PERCOCET 10'S EACH MONTH. DOES NOT NEED ANY MORE PAIN MED FOR HIS KNEE FROM [place] OR EMERGENCY ROOM

## 2016-11-04 NOTE — Addendum Note (Signed)
Addended by: Meyer Cory on: 11/04/2016 05:00 PM   Modules accepted: Orders

## 2016-11-10 ENCOUNTER — Ambulatory Visit: Payer: Medicare Other | Attending: Orthopaedic Surgery

## 2016-11-22 ENCOUNTER — Ambulatory Visit (INDEPENDENT_AMBULATORY_CARE_PROVIDER_SITE_OTHER): Payer: Medicare Other | Admitting: Orthopaedic Surgery

## 2016-11-29 ENCOUNTER — Ambulatory Visit (INDEPENDENT_AMBULATORY_CARE_PROVIDER_SITE_OTHER): Payer: Medicare Other | Admitting: Orthopaedic Surgery

## 2016-12-04 ENCOUNTER — Emergency Department (HOSPITAL_COMMUNITY)
Admission: EM | Admit: 2016-12-04 | Discharge: 2016-12-04 | Disposition: A | Discharge status: Home / Self Care | Payer: Medicare Other | Attending: Emergency Medicine | Admitting: Emergency Medicine

## 2016-12-04 ENCOUNTER — Emergency Department (HOSPITAL_COMMUNITY): Payer: Medicare Other

## 2016-12-04 ENCOUNTER — Emergency Department (HOSPITAL_BASED_OUTPATIENT_CLINIC_OR_DEPARTMENT_OTHER)
Admit: 2016-12-04 | Discharge: 2016-12-04 | Disposition: A | Discharge status: Home / Self Care | Payer: Medicare Other | Attending: Emergency Medicine | Admitting: Emergency Medicine

## 2016-12-04 ENCOUNTER — Encounter (HOSPITAL_COMMUNITY): Payer: Self-pay

## 2016-12-04 DIAGNOSIS — R609 Edema, unspecified: Secondary | ICD-10-CM

## 2016-12-04 DIAGNOSIS — R0600 Dyspnea, unspecified: Secondary | ICD-10-CM | POA: Diagnosis not present

## 2016-12-04 DIAGNOSIS — M7989 Other specified soft tissue disorders: Secondary | ICD-10-CM | POA: Diagnosis not present

## 2016-12-04 DIAGNOSIS — R600 Localized edema: Secondary | ICD-10-CM | POA: Diagnosis not present

## 2016-12-04 DIAGNOSIS — M25561 Pain in right knee: Secondary | ICD-10-CM | POA: Diagnosis not present

## 2016-12-04 DIAGNOSIS — F17210 Nicotine dependence, cigarettes, uncomplicated: Secondary | ICD-10-CM | POA: Diagnosis not present

## 2016-12-04 DIAGNOSIS — Z79899 Other long term (current) drug therapy: Secondary | ICD-10-CM | POA: Diagnosis not present

## 2016-12-04 DIAGNOSIS — E876 Hypokalemia: Secondary | ICD-10-CM | POA: Diagnosis not present

## 2016-12-04 DIAGNOSIS — Z7982 Long term (current) use of aspirin: Secondary | ICD-10-CM | POA: Diagnosis not present

## 2016-12-04 DIAGNOSIS — G8929 Other chronic pain: Secondary | ICD-10-CM

## 2016-12-04 DIAGNOSIS — M79609 Pain in unspecified limb: Secondary | ICD-10-CM

## 2016-12-04 DIAGNOSIS — R0602 Shortness of breath: Secondary | ICD-10-CM | POA: Diagnosis present

## 2016-12-04 LAB — CBC
HCT: 37.3 % — ABNORMAL LOW (ref 39.0–52.0)
Hemoglobin: 12.4 g/dL — ABNORMAL LOW (ref 13.0–17.0)
MCH: 30.5 pg (ref 26.0–34.0)
MCHC: 33.2 g/dL (ref 30.0–36.0)
MCV: 91.9 fL (ref 78.0–100.0)
Platelets: 276 10*3/uL (ref 150–400)
RBC: 4.06 MIL/uL — ABNORMAL LOW (ref 4.22–5.81)
RDW: 12.8 % (ref 11.5–15.5)
WBC: 8.3 10*3/uL (ref 4.0–10.5)

## 2016-12-04 LAB — BASIC METABOLIC PANEL
ANION GAP: 6 (ref 5–15)
BUN: 10 mg/dL (ref 6–20)
CHLORIDE: 104 mmol/L (ref 101–111)
CO2: 26 mmol/L (ref 22–32)
CREATININE: 0.88 mg/dL (ref 0.61–1.24)
Calcium: 9.7 mg/dL (ref 8.9–10.3)
GFR calc non Af Amer: >60 mL/min (ref >60)
Glucose, Bld: 106 mg/dL — ABNORMAL HIGH (ref 65–99)
Potassium: 3.1 mmol/L — ABNORMAL LOW (ref 3.5–5.1)
SODIUM: 136 mmol/L (ref 135–145)

## 2016-12-04 LAB — I-STAT TROPONIN, ED
Troponin i, poc: 0 ng/mL (ref 0.00–0.08)
Troponin i, poc: 0 ng/mL (ref 0.00–0.08)

## 2016-12-04 MED ORDER — POTASSIUM CHLORIDE CRYS ER 20 MEQ PO TBCR: 20.0000 meq | EXTENDED_RELEASE_TABLET | Freq: Every day | ORAL | 0 refills | Status: DC

## 2016-12-04 MED ORDER — POTASSIUM CHLORIDE CRYS ER 20 MEQ PO TBCR
40.0000 meq | EXTENDED_RELEASE_TABLET | Freq: Once | ORAL | Status: AC
  Administered 2016-12-04: 40 meq via ORAL
  Filled 2016-12-04: qty 2

## 2016-12-04 NOTE — ED Triage Notes (Signed)
Pt endorses shortness of breath that began while laying down earlier this evening. Pt able to speak in complete sentences. Pt also complains of right knee pain and had surgery on same knee in April. CMS intact.

## 2016-12-04 NOTE — ED Provider Notes (Signed)
[facility] DEPT Provider Note   CSN: [id number] Arrival date & time: 12/04/16  0321     History   Chief Complaint Chief Complaint  Patient presents with  . Knee Pain  . Shortness of Breath    HPI Peter Manning is a 61 y.o. male.  HPI Patient states he woke up at 2 AM with difficulty breathing. Denies any cough or chest pain. States he opened a window and his shortness of breath improved. Currently denies having any breathing. Denies cough, fever or chills. Has had ongoing right lower extremity discomfort after knee replacement surgery 2 months ago. States he's had swelling from the knee extending down to the ankle. Denies any recent injuries. He has follow-up appointment with Dr. Lorin Mercy this month. Past Medical History:  Diagnosis Date  . Arthritis   . Bronchitis   . Hepatitis 1976   pt. doesn't remember if A, B, C?  . History of kidney stones   . Hyperlipidemia   . Hypertension   . OSA (obstructive sleep apnea) 12/09/2011   PSG 01/03/12>>AHI 45.7, SpO2 low 73%, PLMI 0.  CPAP 15 cm H2O>>AHI 0, +R.   Marland Kitchen Pneumonia 2015  . Sleep apnea     Patient Active Problem List   Diagnosis Date Noted  . Right knee DJD 10/10/2016  . 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 OSA (obstructive sleep apnea) 12/09/2011  . Chronic bronchitis ([place]) 12/09/2011  . Tobacco abuse 12/09/2011    Past Surgical History:  Procedure Laterality Date  . BACK SURGERY    . JOINT REPLACEMENT    . REVISION TOTAL HIP ARTHROPLASTY Left 11/2015  . TOTAL HIP ARTHROPLASTY    . TOTAL HIP REVISION Left 11/18/2015   Procedure: LEFT TOTAL HIP REVISION;  Surgeon: Marybelle Killings, MD;  Location: [facility];  Service: Orthopedics;  Laterality: Left;  . TOTAL KNEE ARTHROPLASTY Right 10/10/2016   Procedure: TOTAL KNEE ARTHROPLASTY;  Surgeon: Marybelle Killings, MD;  Location: [facility];  Service: Orthopedics;  Laterality: Right;       Home Medications    Prior to Admission medications   Medication Sig Start Date End Date Taking?  Authorizing Provider  albuterol (PROVENTIL HFA;VENTOLIN HFA) 108 (90 BASE) MCG/ACT inhaler Inhale 1-2 puffs into the lungs every 6 (six) hours as needed for wheezing or shortness of breath. 04/17/14  Yes Hyman Bible, PA-C  amLODipine (NORVASC) 10 MG tablet Take 10 mg by mouth daily.  08/24/16  Yes [provider]  oxyCODONE-acetaminophen (PERCOCET) 10-325 MG tablet Take 1 tablet by mouth every 6 (six) hours as needed for pain.  10/25/16  Yes [provider]  aspirin EC 325 MG EC tablet Take 1 tablet (325 mg total) by mouth daily with breakfast. Patient not taking: Reported on 12/04/2016 10/12/16   Lanae Crumbly, PA-C  methocarbamol (ROBAXIN) 500 MG tablet Take 1 tablet (500 mg total) by mouth every 6 (six) hours as needed for muscle spasms. Patient not taking: Reported on 12/04/2016 10/12/16   Lanae Crumbly, PA-C  oxyCODONE-acetaminophen (PERCOCET/ROXICET) 5-325 MG tablet Take 2 tablets by mouth every 4 (four) hours as needed. Patient not taking: Reported on 12/04/2016 11/02/16   Tanna Furry, MD  polyethylene glycol [facility] / Floria R[facility]ing) packet Take 17 g by mouth daily as needed for mild constipation. Patient not taking: Reported on 12/04/2016 10/12/16   Lanae Crumbly, PA-C  potassium chloride SA (K-DUR,KLOR-CON) 20 MEQ tablet Take 1 tablet (20 mEq total) by mouth daily. 12/04/16   Julianne Rice,  MD    Family History Family History  Problem Relation Age of Onset  . Heart attack Father   . Cancer Mother     Social History Social History  Substance Use Topics  . Smoking status: Current Some Day Smoker    Packs/day: 0.50    Years: 39.00    Types: Cigarettes  . Smokeless tobacco: Never Used     Comment: states on and off smoker  . Alcohol use No     Allergies   No known allergies   Review of Systems Review of Systems  Constitutional: Negative for chills and fever.  HENT: Negative for congestion, sore throat and trouble swallowing.   Respiratory: Positive for  shortness of breath. Negative for cough, chest tightness and wheezing.   Cardiovascular: Positive for leg swelling. Negative for chest pain and palpitations.  Gastrointestinal: Negative for abdominal pain, constipation, diarrhea, nausea and vomiting.  Genitourinary: Negative for dysuria, flank pain and frequency.  Musculoskeletal: Positive for arthralgias, joint swelling and myalgias.  Skin: Negative for rash and wound.  Neurological: Negative for dizziness, weakness, light-headedness, numbness and headaches.  All other systems reviewed and are negative.    Physical Exam Updated Vital Signs BP 118/77 (BP Location: Right Arm)   Pulse 74   Temp 97.5 F (36.4 C) (Oral)   Resp 16   Ht 5\' 8"  (1.727 m)   Wt 109.8 kg (242 lb)   SpO2 100%   BMI 36.80 kg/m   Physical Exam  Constitutional: He is oriented to person, place, and time. He appears well-developed and well-nourished. No distress.  HENT:  Head: Normocephalic and atraumatic.  Mouth/Throat: Oropharynx is clear and moist. No oropharyngeal exudate.  Eyes: EOM are normal. Pupils are equal, round, and reactive to light.  Neck: Normal range of motion. Neck supple. No JVD present.  Cardiovascular: Normal rate and regular rhythm.  Exam reveals no gallop and no friction rub.   No murmur heard. Pulmonary/Chest: Effort normal and breath sounds normal. No respiratory distress. He has no wheezes. He has no rales. He exhibits no tenderness.  Abdominal: Soft. Bowel sounds are normal. There is no tenderness. There is no rebound and no guarding.  Musculoskeletal: Normal range of motion. He exhibits edema. He exhibits no tenderness.  Patient with anterior vertical midline surgical scar of the right knee. Appears to be well-healing. No erythema or induration. Patient does have diffuse swelling in the suprapatellar region extending down to the calf and ankle. Right lower extremity. Mild tenderness to palpation of the medial surface of the right knee.  No obvious injuries. Very mild calf swelling. 2+ distal pulses.  Neurological: He is alert and oriented to person, place, and time.  Moving all extremities without focal deficit. Sensation intact.  Skin: Skin is warm and dry. Capillary refill takes less than 2 seconds. No rash noted. No erythema.  Psychiatric: He has a normal mood and affect. His behavior is normal.  Nursing note and vitals reviewed.    ED Treatments / Results  Labs (all labs ordered are listed, but only abnormal results are displayed) Labs Reviewed  BASIC METABOLIC PANEL - Abnormal; Notable for the following:       Result Value   Potassium 3.1 (*)    Glucose, Bld 106 (*)    All other components within normal limits  CBC - Abnormal; Notable for the following:    RBC 4.06 (*)    Hemoglobin 12.4 (*)    HCT 37.3 (*)    All other  components within normal limits  I-STAT TROPOININ, ED  I-STAT TROPOININ, ED    EKG  EKG Interpretation  Date/Time:  Sunday December 04 2016 03:27:45 EDT Ventricular Rate:  72 PR Interval:  148 QRS Duration: 98 QT Interval:  398 QTC Calculation: 435 R Axis:   59 Text Interpretation:  Normal sinus rhythm Nonspecific T wave abnormality Abnormal ECG Confirmed by Lita Mains  MD, Kehlani Vancamp ([id number]) on 12/04/2016 8:14:43 AM       Radiology Dg Chest 2 View  Result Date: 12/04/2016 CLINICAL DATA:  Shortness of breath EXAM: CHEST  2 VIEW COMPARISON:  Three hundred twenty-nine 1,018 FINDINGS: The heart size and mediastinal contours are within normal limits. Both lungs are clear. Degenerative changes of the spine. IMPRESSION: No active cardiopulmonary disease. Electronically Signed   By: Donavan Foil M.D.   On: 12/04/2016 03:57    Procedures Procedures (including critical care time)  Medications Ordered in ED Medications  potassium chloride SA (K-DUR,KLOR-CON) CR tablet 40 mEq (40 mEq Oral Given 12/04/16 3888)     Initial Impression / Assessment and Plan / ED Course  I have reviewed the triage  vital signs and the nursing notes.  Pertinent labs & imaging results that were available during my care of the patient were reviewed by me and considered in my medical decision making (see chart for details).     Troponin 2 is normal. Chest x-ray without evidence of pulmonary edema or acute abnormality. Ultrasound of the right lower extremity without evidence of DVT. Patient is very well-appearing. Denies shortness of breath or chest pain currently. Advised to keep leg elevated and wear compression stockings. Has appointment to follow-up with Dr. Lorin Mercy this month. Given oral potassium replacement in the emergency department and will discharge home with short course of oral potassium. Advised to follow-up with his primary physician and return precautions given.  Final Clinical Impressions(s) / ED Diagnoses   Final diagnoses:  Paroxysmal nocturnal dyspnea  Chronic pain of right knee  Peripheral edema  Hypokalemia    New Prescriptions New Prescriptions   POTASSIUM CHLORIDE SA (K-DUR,KLOR-CON) 20 MEQ TABLET    Take 1 tablet (20 mEq total) by mouth daily.     Julianne Rice, MD 12/04/16 1007

## 2016-12-04 NOTE — Progress Notes (Signed)
VASCULAR LAB PRELIMINARY  PRELIMINARY  PRELIMINARY  PRELIMINARY  Right lower extremity venous duplex completed.    Preliminary report:  There is no DVT or SVT noted in the right lower extremity.   Called report to Dr. Serafina Mitchell, [facility], RVT 12/04/2016, 8:37 AM

## 2016-12-04 NOTE — ED Notes (Signed)
Returned from xray

## 2016-12-04 NOTE — ED Notes (Signed)
Patient transported to X-ray 

## 2016-12-12 ENCOUNTER — Telehealth (INDEPENDENT_AMBULATORY_CARE_PROVIDER_SITE_OTHER): Payer: Self-pay | Admitting: Orthopaedic Surgery

## 2016-12-12 NOTE — Telephone Encounter (Signed)
Patient called having a few questions about his knee he said it keeps getting really swollen and tightening up. CB # [phone]

## 2016-12-13 NOTE — Telephone Encounter (Signed)
Patient states continued swelling and difficult extension of knee. Continues to lock.  Appt made for 6/13

## 2016-12-14 ENCOUNTER — Encounter (INDEPENDENT_AMBULATORY_CARE_PROVIDER_SITE_OTHER): Payer: Self-pay | Admitting: Orthopaedic Surgery

## 2016-12-14 ENCOUNTER — Ambulatory Visit (INDEPENDENT_AMBULATORY_CARE_PROVIDER_SITE_OTHER): Payer: Medicare Other | Admitting: Orthopaedic Surgery

## 2016-12-14 VITALS — Ht 68.0 in | Wt 242.0 lb

## 2016-12-14 DIAGNOSIS — Z96651 Presence of right artificial knee joint: Secondary | ICD-10-CM | POA: Diagnosis not present

## 2016-12-14 MED ORDER — LIDOCAINE HCL 1 % IJ SOLN
1.0000 mL | INTRAMUSCULAR | Status: AC | PRN
  Administered 2016-12-14: 1 mL

## 2016-12-14 NOTE — Progress Notes (Signed)
Office Visit Note   Patient: Peter Manning           Date of Birth: 1955-11-25           MRN: [id number] Visit Date: 12/14/2016              Requested by: Nolene Ebbs, MD [address] [place], [place] [address] PCP: Nolene Ebbs, MD   Assessment & Plan: Visit Diagnoses:  1. S/P total knee arthroplasty, right     Plan: Repeat aspiration performed we just got 15 mL of serosanguineous fluid. No purulence. We demonstrated and then had him repeat prone positioning and he will apply with bag with ankle weights or equivalent 10-15 pounds due to several times a day to fix his flexion contracture. He has good flexion and quad strength is good. I'll recheck him in 4 weeks and discuss with him that he will not have satisfactory outcome unless he is able to get his posterior capsule stretched out and he knows exactly when he needs to work on and is in agreement to do so. Recheck 4 weeks.  Follow-Up Instructions: Return in about 4 weeks (around 01/11/2017).   Orders:  Orders Placed This Encounter  Procedures  . Large Joint Injection/Arthrocentesis   No orders of the defined types were placed in this encounter.     Procedures: Large Joint Inj Date/Time: 12/14/2016 10:08 AM Performed by: Marybelle Killings Authorized by: Rodell Perna C   Consent Given by:  Patient Indications:  Pain and joint swelling Location:  Knee Site:  R knee Needle Size:  18 G Needle Length:  1.5 inches Approach:  Anterolateral Ultrasound Guidance: No   Fluoroscopic Guidance: No   Arthrogram: No   Medications:  1 mL lidocaine 1 % Aspiration Attempted: Yes   Aspirate amount (mL):  20 Aspirate:  Serous and blood-tinged Patient tolerance:  Patient tolerated the procedure well with no immediate complications       Clinical Data: No additional findings.   Subjective: Chief Complaint  Patient presents with  . Right Knee - Pain, Follow-up    HPI patient returns still having problems with swelling in  his knee. Previous aspiration of serous slightly blood-tinged fluid from his knee. He's had some recurrence. He has a 20 knee flexion contracture. He went to skilled nursing facility is unable to flex the 120 but at home he lost his extension and has been ambulating with a cane with a bent knee limp. Collateral ligaments are stable. No palpable defect in the quad. No palpable Baker's cyst.  Review of Systems updated and unchanged he remains in the postoperative time. Patient had a negative Doppler for DVT.   Objective: Vital Signs: Ht 5\' 8"  (1.727 m)   Wt 242 lb (109.8 kg)   BMI 36.80 kg/m   Physical Exam incisions well-healed. Suprapatellar fluid is noted with effusion slight increased warmth no palpable defect in the quad. He has flexion to under 20. Lacks 20 reaching full extension and I can put a ball fist behind his knee when he is in the supine position. Ankle dorsiflexion is normal tenderness.  Ortho Exam  Specialty Comments:  No specialty comments available.  Imaging: No results found.   PMFS History: Patient Active Problem List   Diagnosis Date Noted  . Right knee DJD 10/10/2016  . 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 OSA (obstructive sleep apnea) 12/09/2011  . Chronic bronchitis ([place]) 12/09/2011  . Tobacco abuse 12/09/2011   Past Medical History:  Diagnosis Date  . Arthritis   . Bronchitis   . Hepatitis 1976   pt. doesn't remember if A, B, C?  . History of kidney stones   . Hyperlipidemia   . Hypertension   . OSA (obstructive sleep apnea) 12/09/2011   PSG 01/03/12>>AHI 45.7, SpO2 low 73%, PLMI 0.  CPAP 15 cm H2O>>AHI 0, +R.   Marland Kitchen Pneumonia 2015  . Sleep apnea     Family History  Problem Relation Age of Onset  . Heart attack Father   . Cancer Mother     Past Surgical History:  Procedure Laterality Date  . BACK SURGERY    . JOINT REPLACEMENT    . REVISION TOTAL HIP ARTHROPLASTY Left 11/2015  . TOTAL HIP ARTHROPLASTY    . TOTAL HIP REVISION Left  11/18/2015   Procedure: LEFT TOTAL HIP REVISION;  Surgeon: Marybelle Killings, MD;  Location: [facility];  Service: Orthopedics;  Laterality: Left;  . TOTAL KNEE ARTHROPLASTY Right 10/10/2016   Procedure: TOTAL KNEE ARTHROPLASTY;  Surgeon: Marybelle Killings, MD;  Location: [facility];  Service: Orthopedics;  Laterality: Right;   Social History   Occupational History  . Not on file.   Social History Main Topics  . Smoking status: Current Some Day Smoker    Packs/day: 0.50    Years: 39.00    Types: Cigarettes  . Smokeless tobacco: Never Used     Comment: states on and off smoker  . Alcohol use No  . Drug use: No  . Sexual activity: Not on file

## 2016-12-28 ENCOUNTER — Encounter (INDEPENDENT_AMBULATORY_CARE_PROVIDER_SITE_OTHER): Payer: Self-pay | Admitting: Orthopaedic Surgery

## 2016-12-28 ENCOUNTER — Ambulatory Visit (INDEPENDENT_AMBULATORY_CARE_PROVIDER_SITE_OTHER): Payer: Medicare Other | Admitting: Orthopaedic Surgery

## 2016-12-28 VITALS — BP 127/77 | HR 68 | Ht 68.0 in | Wt 242.0 lb

## 2016-12-28 DIAGNOSIS — Z96651 Presence of right artificial knee joint: Secondary | ICD-10-CM | POA: Insufficient documentation

## 2016-12-28 NOTE — Progress Notes (Signed)
   Post-Op Visit Note   Patient: Peter Manning           Date of Birth: Feb 16, 1956           MRN: [id number] Visit Date: 12/28/2016 PCP: Nolene Ebbs, MD   Assessment & Plan: Outpatient physical therapy referral to work on his lack of extension. He needs to increase the weight in time on his prone positioning with the book bag across his ankle. I'll recheck him in one month.  Chief Complaint:  Chief Complaint  Patient presents with  . Right Knee - Routine Post Op, Follow-up   Visit Diagnoses:  1. S/P total knee arthroplasty, right     Plan: PT referral. He lacks 15 reaching full extension without a firm endpoint. Swelling in his knees down and he previously had aspiration of serosanguineous fluid. He is ambulating with a cane due to weakness in the quad and walks with the flexed knee gait. After he left the skilled facility he had minimal home health PT. I discussed with him that if he does not make progress with extension he may require knee arthroscopy and manipulation under anesthesia. He is on MS Contin and also oxycodone 10/325 for pain management provided elsewhere.  Follow-Up Instructions: No Follow-up on file.   Orders:  No orders of the defined types were placed in this encounter.  No orders of the defined types were placed in this encounter.   Imaging: No results found.  PMFS History: Patient Active Problem List   Diagnosis Date Noted  . S/P total knee arthroplasty, right 12/28/2016  . Right knee DJD 10/10/2016  . 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 OSA (obstructive sleep apnea) 12/09/2011  . Chronic bronchitis ([facility]) 12/09/2011  . Tobacco abuse 12/09/2011   Past Medical History:  Diagnosis Date  . Arthritis   . Bronchitis   . Hepatitis 1976   pt. doesn't remember if A, B, C?  . History of kidney stones   . Hyperlipidemia   . Hypertension   . OSA (obstructive sleep apnea) 12/09/2011   PSG 01/03/12>>AHI 45.7, SpO2 low 73%, PLMI 0.  CPAP 15 cm H2O>>AHI  0, +R.   Marland Kitchen Pneumonia 2015  . Sleep apnea     Family History  Problem Relation Age of Onset  . Heart attack Father   . Cancer Mother     Past Surgical History:  Procedure Laterality Date  . BACK SURGERY    . JOINT REPLACEMENT    . REVISION TOTAL HIP ARTHROPLASTY Left 11/2015  . TOTAL HIP ARTHROPLASTY    . TOTAL HIP REVISION Left 11/18/2015   Procedure: LEFT TOTAL HIP REVISION;  Surgeon: Marybelle Killings, MD;  Location: [facility];  Service: Orthopedics;  Laterality: Left;  . TOTAL KNEE ARTHROPLASTY Right 10/10/2016   Procedure: TOTAL KNEE ARTHROPLASTY;  Surgeon: Marybelle Killings, MD;  Location: [facility];  Service: Orthopedics;  Laterality: Right;   Social History   Occupational History  . Not on file.   Social History Main Topics  . Smoking status: Current Some Day Smoker    Packs/day: 0.50    Years: 39.00    Types: Cigarettes  . Smokeless tobacco: Never Used     Comment: states on and off smoker  . Alcohol use No  . Drug use: No  . Sexual activity: Not on file

## 2016-12-28 NOTE — Addendum Note (Signed)
Addended by: Meyer Cory on: 12/28/2016 04:26 PM   Modules accepted: Orders

## 2016-12-31 ENCOUNTER — Emergency Department (HOSPITAL_COMMUNITY): Payer: Medicare Other

## 2016-12-31 ENCOUNTER — Encounter (HOSPITAL_COMMUNITY): Payer: Self-pay | Admitting: Emergency Medicine

## 2016-12-31 ENCOUNTER — Emergency Department (HOSPITAL_COMMUNITY)
Admission: EM | Admit: 2016-12-31 | Discharge: 2016-12-31 | Disposition: A | Discharge status: Home / Self Care | Payer: Medicare Other | Attending: Emergency Medicine | Admitting: Emergency Medicine

## 2016-12-31 DIAGNOSIS — Z79899 Other long term (current) drug therapy: Secondary | ICD-10-CM | POA: Insufficient documentation

## 2016-12-31 DIAGNOSIS — R05 Cough: Secondary | ICD-10-CM | POA: Diagnosis present

## 2016-12-31 DIAGNOSIS — F17210 Nicotine dependence, cigarettes, uncomplicated: Secondary | ICD-10-CM | POA: Insufficient documentation

## 2016-12-31 DIAGNOSIS — Z96651 Presence of right artificial knee joint: Secondary | ICD-10-CM | POA: Insufficient documentation

## 2016-12-31 DIAGNOSIS — Z7982 Long term (current) use of aspirin: Secondary | ICD-10-CM | POA: Diagnosis not present

## 2016-12-31 DIAGNOSIS — G8929 Other chronic pain: Secondary | ICD-10-CM | POA: Diagnosis not present

## 2016-12-31 DIAGNOSIS — Z96642 Presence of left artificial hip joint: Secondary | ICD-10-CM | POA: Diagnosis not present

## 2016-12-31 DIAGNOSIS — I10 Essential (primary) hypertension: Secondary | ICD-10-CM | POA: Insufficient documentation

## 2016-12-31 DIAGNOSIS — J069 Acute upper respiratory infection, unspecified: Secondary | ICD-10-CM | POA: Insufficient documentation

## 2016-12-31 DIAGNOSIS — M25561 Pain in right knee: Secondary | ICD-10-CM | POA: Diagnosis not present

## 2016-12-31 DIAGNOSIS — B9789 Other viral agents as the cause of diseases classified elsewhere: Secondary | ICD-10-CM

## 2016-12-31 LAB — CBC
HCT: 38.2 % — ABNORMAL LOW (ref 39.0–52.0)
Hemoglobin: 12.7 g/dL — ABNORMAL LOW (ref 13.0–17.0)
MCH: 30.7 pg (ref 26.0–34.0)
MCHC: 33.2 g/dL (ref 30.0–36.0)
MCV: 92.3 fL (ref 78.0–100.0)
Platelets: 247 10*3/uL (ref 150–400)
RBC: 4.14 MIL/uL — ABNORMAL LOW (ref 4.22–5.81)
RDW: 13.2 % (ref 11.5–15.5)
WBC: 8.1 10*3/uL (ref 4.0–10.5)

## 2016-12-31 LAB — BASIC METABOLIC PANEL
Anion gap: 9 (ref 5–15)
BUN: 13 mg/dL (ref 6–20)
CO2: 24 mmol/L (ref 22–32)
Calcium: 9.7 mg/dL (ref 8.9–10.3)
Chloride: 105 mmol/L (ref 101–111)
Creatinine, Ser: 1.22 mg/dL (ref 0.61–1.24)
GFR calc Af Amer: >60 mL/min (ref >60)
GFR calc non Af Amer: >60 mL/min (ref >60)
Glucose, Bld: 137 mg/dL — ABNORMAL HIGH (ref 65–99)
Potassium: 3.2 mmol/L — ABNORMAL LOW (ref 3.5–5.1)
Sodium: 138 mmol/L (ref 135–145)

## 2016-12-31 MED ORDER — OXYCODONE-ACETAMINOPHEN 5-325 MG PO TABS
1.0000 | ORAL_TABLET | Freq: Once | ORAL | Status: AC
  Administered 2016-12-31: 1 via ORAL
  Filled 2016-12-31: qty 1

## 2016-12-31 MED ORDER — FLUTICASONE PROPIONATE 50 MCG/ACT NA SUSP: 1.0000 | Freq: Every day | NASAL | 2 refills | Status: DC

## 2016-12-31 MED ORDER — BENZONATATE 100 MG PO CAPS: 200.0000 mg | ORAL_CAPSULE | Freq: Two times a day (BID) | ORAL | 0 refills | Status: DC | PRN

## 2016-12-31 MED ORDER — IBUPROFEN 600 MG PO TABS: 600.0000 mg | ORAL_TABLET | Freq: Four times a day (QID) | ORAL | 0 refills | Status: DC | PRN

## 2016-12-31 MED ORDER — IBUPROFEN 400 MG PO TABS
600.0000 mg | ORAL_TABLET | Freq: Once | ORAL | Status: AC
  Administered 2016-12-31: 600 mg via ORAL
  Filled 2016-12-31: qty 1

## 2016-12-31 MED ORDER — POTASSIUM CHLORIDE CRYS ER 20 MEQ PO TBCR
40.0000 meq | EXTENDED_RELEASE_TABLET | Freq: Once | ORAL | Status: AC
  Administered 2016-12-31: 40 meq via ORAL
  Filled 2016-12-31: qty 2

## 2016-12-31 MED ORDER — BENZONATATE 100 MG PO CAPS
200.0000 mg | ORAL_CAPSULE | Freq: Once | ORAL | Status: AC
  Administered 2016-12-31: 200 mg via ORAL
  Filled 2016-12-31: qty 2

## 2016-12-31 NOTE — ED Provider Notes (Signed)
[facility] DEPT Provider Note   CSN: [id number] Arrival date & time: 12/31/16  0236     History   Chief Complaint Chief Complaint  Patient presents with  . Cough    HPI Peter Manning is a 61 y.o. male.  HPI   Patient is a 61 year old male with history of hypertension, hyperlipidemia, OSA and right knee replacement (on 10/07/16 by Dr. Lorin Mercy) who presents to the ED with multiple complaints. Patient reports he has continued to have pain and swelling to his right knee over the past week which he notes is chronic and unchanged since his recent knee surgery. He states he has been taking his home prescription of Percocet with only mild intermittent relief. Denies any recent fall, trauma or injury. Patient reports pain is worse with movement or when ambulating. He states he has been ambulating with a cane at home. Denies fever, redness, warmth, numbness, weakness. Patient states he saw Dr. Lorin Mercy earlier this week and is scheduled to start physical therapy on 01/05/17.  Patient also reports having a productive cough with clear/yellow sputum for the past week. Endorses associated postnasal drip. Denies fever, chills, nasal congestion, rhinorrhea, sore throat, hemoptysis, chest pain, shortness of breath, wheezing, vomiting. Patient denies taking any medications at home for symptoms. Denies any known sick contacts. Denies any recent hospitalizations or antibiotic use.  PCP- Dr. Madelin Headings- Dr. Lorin Mercy  Past Medical History:  Diagnosis Date  . Arthritis   . Bronchitis   . Hepatitis 1976   pt. doesn't remember if A, B, C?  . History of kidney stones   . Hyperlipidemia   . Hypertension   . OSA (obstructive sleep apnea) 12/09/2011   PSG 01/03/12>>AHI 45.7, SpO2 low 73%, PLMI 0.  CPAP 15 cm H2O>>AHI 0, +R.   Marland Kitchen Pneumonia 2015  . Sleep apnea     Patient Active Problem List   Diagnosis Date Noted  . S/P total knee arthroplasty, right 12/28/2016  . Right knee DJD 10/10/2016  . S/P revision of  total hip 11/18/2015  . OSA (obstructive sleep apnea) 12/09/2011  . Chronic bronchitis ([facility]) 12/09/2011  . Tobacco abuse 12/09/2011    Past Surgical History:  Procedure Laterality Date  . BACK SURGERY    . JOINT REPLACEMENT    . REVISION TOTAL HIP ARTHROPLASTY Left 11/2015  . TOTAL HIP ARTHROPLASTY    . TOTAL HIP REVISION Left 11/18/2015   Procedure: LEFT TOTAL HIP REVISION;  Surgeon: Marybelle Killings, MD;  Location: [facility];  Service: Orthopedics;  Laterality: Left;  . TOTAL KNEE ARTHROPLASTY Right 10/10/2016   Procedure: TOTAL KNEE ARTHROPLASTY;  Surgeon: Marybelle Killings, MD;  Location: [facility];  Service: Orthopedics;  Laterality: Right;       Home Medications    Prior to Admission medications   Medication Sig Start Date End Date Taking? Authorizing Provider  albuterol (PROVENTIL HFA;VENTOLIN HFA) 108 (90 BASE) MCG/ACT inhaler Inhale 1-2 puffs into the lungs every 6 (six) hours as needed for wheezing or shortness of breath. 04/17/14   Hyman Bible, PA-C  amLODipine (NORVASC) 10 MG tablet Take 10 mg by mouth daily.  08/24/16   [provider]  aspirin EC 325 MG EC tablet Take 1 tablet (325 mg total) by mouth daily with breakfast. Patient not taking: Reported on 12/04/2016 10/12/16   Lanae Crumbly, PA-C  benzonatate (TESSALON) 100 MG capsule Take 2 capsules (200 mg total) by mouth 2 (two) times daily as needed for cough. 12/31/16   Harlene Ramus  Elizabeth, PA-C  fluticasone [facility]) 50 MCG/ACT nasal spray Place 1 spray into both nostrils daily. 12/31/16   Nona Dell, PA-C  ibuprofen (ADVIL,MOTRIN) 600 MG tablet Take 1 tablet (600 mg total) by mouth every 6 (six) hours as needed. 12/31/16   Nona Dell, PA-C  methocarbamol (ROBAXIN) 500 MG tablet Take 1 tablet (500 mg total) by mouth every 6 (six) hours as needed for muscle spasms. Patient not taking: Reported on 12/04/2016 10/12/16   Lanae Crumbly, PA-C  morphine (MS CONTIN) 15 MG 12 hr tablet  12/23/16   [provider]  oxyCODONE-acetaminophen (PERCOCET) 10-325 MG tablet Take 1 tablet by mouth every 6 (six) hours as needed for pain.  10/25/16   [provider]  oxyCODONE-acetaminophen (PERCOCET/ROXICET) 5-325 MG tablet Take 2 tablets by mouth every 4 (four) hours as needed. Patient not taking: Reported on 12/04/2016 11/02/16   Tanna Furry, MD  polyethylene glycol [facility] Floria Raveling) packet Take 17 g by mouth daily as needed for mild constipation. Patient not taking: Reported on 12/04/2016 10/12/16   Lanae Crumbly, PA-C  potassium chloride SA (K-DUR,KLOR-CON) 20 MEQ tablet Take 1 tablet (20 mEq total) by mouth daily. 12/04/16   Julianne Rice, MD    Family History Family History  Problem Relation Age of Onset  . Heart attack Father   . Cancer Mother     Social History Social History  Substance Use Topics  . Smoking status: Current Some Day Smoker    Packs/day: 0.50    Years: 39.00    Types: Cigarettes  . Smokeless tobacco: Never Used     Comment: states on and off smoker  . Alcohol use No     Allergies   No known allergies   Review of Systems Review of Systems  HENT: Positive for postnasal drip.   Respiratory: Positive for cough.   Musculoskeletal: Positive for arthralgias (right knee).  All other systems reviewed and are negative.    Physical Exam Updated Vital Signs BP 138/80 (BP Location: Right Arm)   Pulse 71   Temp 97.8 F (36.6 C) (Oral)   Resp 16   Ht 5\' 8"  (1.727 m)   Wt 111.1 kg (245 lb)   SpO2 100%   BMI 37.25 kg/m   Physical Exam  Constitutional: He is oriented to person, place, and time. He appears well-developed and well-nourished. No distress.  HENT:  Head: Normocephalic and atraumatic.  Right Ear: Tympanic membrane normal.  Left Ear: Tympanic membrane normal.  Nose: Nose normal. Right sinus exhibits no maxillary sinus tenderness and no frontal sinus tenderness. Left sinus exhibits no maxillary sinus tenderness and no frontal sinus  tenderness.  Mouth/Throat: Uvula is midline, oropharynx is clear and moist and mucous membranes are normal. No oropharyngeal exudate, posterior oropharyngeal edema, posterior oropharyngeal erythema or tonsillar abscesses. No tonsillar exudate.  Eyes: Conjunctivae and EOM are normal. Right eye exhibits no discharge. Left eye exhibits no discharge. No scleral icterus.  Neck: Normal range of motion. Neck supple.  Cardiovascular: Normal rate, regular rhythm, normal heart sounds and intact distal pulses.   Pulmonary/Chest: Effort normal and breath sounds normal. No respiratory distress. He has no wheezes. He has no rales. He exhibits no tenderness.  Abdominal: Soft. He exhibits no distension. There is no tenderness.  Musculoskeletal: He exhibits tenderness. He exhibits no edema or deformity.       Right knee: He exhibits decreased range of motion (due to pain), swelling and effusion. He exhibits no ecchymosis,  no deformity, no laceration, no erythema, normal alignment and normal patellar mobility. Tenderness (anterior) found.       Right ankle: Normal.       Right upper leg: Normal.       Right lower leg: Normal.       Right foot: Normal.  Moderate swelling and joint effusion present to right knee with mild diffuse TTP over anterior knee. No erythema, warmth, rash, abrasions, ecchymoses. Dec ROM of right knee due to pain. FROM of right hip, ankle and foot with 5/5 strength. Sensation intact. 2+ DP pulse. No calf swelling or tenderness.  Neurological: He is alert and oriented to person, place, and time.  Skin: Skin is warm and dry. He is not diaphoretic.  Nursing note and vitals reviewed.    ED Treatments / Results  Labs (all labs ordered are listed, but only abnormal results are displayed) Labs Reviewed  CBC - Abnormal; Notable for the following:       Result Value   RBC 4.14 (*)    Hemoglobin 12.7 (*)    HCT 38.2 (*)    All other components within normal limits  BASIC METABOLIC PANEL -  Abnormal; Notable for the following:    Potassium 3.2 (*)    Glucose, Bld 137 (*)    All other components within normal limits    EKG  EKG Interpretation None       Radiology Dg Chest 2 View  Result Date: 12/31/2016 CLINICAL DATA:  Chest pain EXAM: CHEST  2 VIEW COMPARISON:  12/04/2016 FINDINGS: The heart size and mediastinal contours are within normal limits. Both lungs are clear. Degenerative changes of the spine IMPRESSION: No active cardiopulmonary disease. Electronically Signed   By: Donavan Foil M.D.   On: 12/31/2016 03:38   Dg Knee Complete 4 Views Right  Result Date: 12/31/2016 CLINICAL DATA:  Right knee replacement in April with pain since operation EXAM: RIGHT KNEE - COMPLETE 4+ VIEW COMPARISON:  10/10/2016 FINDINGS: Status post right knee replacement with intact hardware and normal alignment. Moderate large suprapatellar joint effusion with moderate prepatellar soft tissue swelling. There is no fracture seen. There is swelling over the medial aspect of the knee. There are small soft tissue calcifications along the medial joint space. IMPRESSION: Status post right knee replacement with moderate to large amount of soft tissue swelling medially and anteriorly. Moderate to large suprapatellar joint effusion. Electronically Signed   By: Donavan Foil M.D.   On: 12/31/2016 03:40    Procedures Procedures (including critical care time)  Medications Ordered in ED Medications  potassium chloride SA (K-DUR,KLOR-CON) CR tablet 40 mEq (not administered)  benzonatate (TESSALON) capsule 200 mg (200 mg Oral Given 12/31/16 0809)  ibuprofen (ADVIL,MOTRIN) tablet 600 mg (600 mg Oral Given 12/31/16 0810)  oxyCODONE-acetaminophen (PERCOCET/ROXICET) 5-325 MG per tablet 1 tablet (1 tablet Oral Given 12/31/16 0810)     Initial Impression / Assessment and Plan / ED Course  I have reviewed the triage vital signs and the nursing notes.  Pertinent labs & imaging results that were available during  my care of the patient were reviewed by me and considered in my medical decision making (see chart for details).    Patient presents with unchanged right knee pain and swelling that have been present since having his knee replacement on 10/07/16 by Dr. Lorin Mercy. Reports mild intermittent relief with Percocet he is taking at home. Denies any recent fall, trauma or injury. Denies fever, redness, warmth. VSS. Exam revealed moderate swelling  and joint effusion present to right knee with decreased range of motion due to pain, right lower extremity otherwise neurovascularly intact. Patient is ambulating with cane at baseline. Patient given pain meds and ice in the ED. Chart review shows patient was recently seen by Dr. Lorin Mercy on 12/28/16. Patient had aspiration performed on 6/13 which revealed serosanguineous fluid. Plan for patient to be referred to physical therapy and have him continue taking his prescribed MS Contin and oxycodone 10/325 for pain management elsewhere; f/u in 1 month. Right knee x-ray in the ED today revealed intact hardware and normal alignment with moderate to large amount of soft tissue swelling medially and anteriorly, moderate suprapatellar joint effusion, no acute fracture. Patient's symptoms appear chronic and unchanged. Pt is able ambulate with cane, no bony abnormality or deformity, no erythema or excessive heat, no evidence of cellulitis, DVT, gout or septic joint. Plan to discharge patient home with symptomatic treatment, cryotherapy and advised to continue taking his home pain medications as prescribed. Discussed with patient importance of continuing to try to elevate and ice his knee to reduce his swelling prior to starting physical therapy next week. Advised patient to follow up with his orthopedist as scheduled. Discussed return precautions.  Patient also presents with intermittent productive cough he has had for the past week with associated postnasal drip. Denies fever, nasal congestion,  rhinorrhea, sore throat, shortness of breath, chest pain. Denies any known sick contacts. Exam unremarkable, lungs clear auscultation bilaterally. Chest x-ray negative. Suspect patient's symptoms are likely due to viral URI. Plan to discharge patient home with symptomatic treatment and he speaks follow-up as needed.   Final Clinical Impressions(s) / ED Diagnoses   Final diagnoses:  Chronic pain of right knee  Viral URI with cough    New Prescriptions New Prescriptions   BENZONATATE (TESSALON) 100 MG CAPSULE    Take 2 capsules (200 mg total) by mouth 2 (two) times daily as needed for cough.   FLUTICASONE (FLONASE) 50 MCG/ACT NASAL SPRAY    Place 1 spray into both nostrils daily.   IBUPROFEN (ADVIL,MOTRIN) 600 MG TABLET    Take 1 tablet (600 mg total) by mouth every 6 (six) hours as needed.     Nona Dell, PA-C 12/31/16 [id number]    Virgel Manifold, MD 01/06/17 [phone]

## 2016-12-31 NOTE — Discharge Instructions (Signed)
Take your medications as prescribed. Continue taking your home pain meds as prescribed as needed for pain relief. Continue to rest, elevate and ice your knee for 15-20 minutes 4-5 times daily to help reduce pain and swelling.  Follow-up with your primary care provider within the next week if your URI symptoms have not improved. Follow-up with your orthopedist at your next scheduled appointment and continue going to your physical therapy appointments as scheduled. Please return to the Emergency Department if symptoms worsen or new onset of fever, chest pain, difficulty breathing, coughing up blood, vomiting, redness, warmth, numbness, weakness, unable to ambulate.

## 2016-12-31 NOTE — ED Triage Notes (Signed)
Pt reports productive cough X2 weeks. Also endorses R knee pain, hx of knee replacement on April 6th, states pain has worsened and knee is "hot"

## 2016-12-31 NOTE — ED Notes (Signed)
Pt and significant other voice understanding of discharge instructions. NAD at departure.

## 2017-01-05 ENCOUNTER — Encounter: Payer: Self-pay | Admitting: Physical Therapy

## 2017-01-05 ENCOUNTER — Ambulatory Visit: Payer: Medicare Other | Attending: Orthopaedic Surgery | Admitting: Physical Therapy

## 2017-01-05 DIAGNOSIS — R600 Localized edema: Secondary | ICD-10-CM | POA: Diagnosis present

## 2017-01-05 DIAGNOSIS — G8929 Other chronic pain: Secondary | ICD-10-CM | POA: Diagnosis present

## 2017-01-05 DIAGNOSIS — M25561 Pain in right knee: Secondary | ICD-10-CM | POA: Insufficient documentation

## 2017-01-05 DIAGNOSIS — M25661 Stiffness of right knee, not elsewhere classified: Secondary | ICD-10-CM

## 2017-01-05 DIAGNOSIS — R2689 Other abnormalities of gait and mobility: Secondary | ICD-10-CM | POA: Diagnosis present

## 2017-01-05 NOTE — Therapy (Signed)
[place], [place], [address] Phone: [phone]   Fax:  [phone]  Physical Therapy Evaluation  Patient Details  Name: Peter Manning MRN: [id number] Date of Birth: 09/11/1955 Referring Provider: Rodell Perna MD  Encounter Date: 01/05/2017      PT End of Session - 01/05/17 1057    Visit Number 1   Number of Visits 18   Date for PT Re-Evaluation 03/02/17   Authorization Type MCR: Kx mod by 15th visit, Progress note by 10th visit or 8/5   PT Start Time 1015   PT Stop Time 1057   PT Time Calculation (min) 42 min   Activity Tolerance Patient tolerated treatment well;Patient limited by pain   Behavior During Therapy [facility] for tasks assessed/performed      Past Medical History:  Diagnosis Date  . Arthritis   . Bronchitis   . Hepatitis 1976   pt. doesn't remember if A, B, C?  . History of kidney stones   . Hyperlipidemia   . Hypertension   . OSA (obstructive sleep apnea) 12/09/2011   PSG 01/03/12>>AHI 45.7, SpO2 low 73%, PLMI 0.  CPAP 15 cm H2O>>AHI 0, +R.   Marland Kitchen Pneumonia 2015  . Sleep apnea     Past Surgical History:  Procedure Laterality Date  . BACK SURGERY    . JOINT REPLACEMENT    . REVISION TOTAL HIP ARTHROPLASTY Left 11/2015  . TOTAL HIP ARTHROPLASTY    . TOTAL HIP REVISION Left 11/18/2015   Procedure: LEFT TOTAL HIP REVISION;  Surgeon: Marybelle Killings, MD;  Location: [place];  Service: Orthopedics;  Laterality: Left;  . TOTAL KNEE ARTHROPLASTY Right 10/10/2016   Procedure: TOTAL KNEE ARTHROPLASTY;  Surgeon: Marybelle Killings, MD;  Location: [place];  Service: Orthopedics;  Laterality: Right;    There were no vitals filed for this visit.       Subjective Assessment - 01/05/17 1020    Subjective pt is a 61 y.o M with s/p R TKA on 10/10/2016. since surgery the R knee still swollen and still has pain along the inside of the knee. biggest issue is getting the knee to go back and feels like is just stuck.    How  long can you sit comfortably? 20-30 min   How long can you stand comfortably? 30 min with SPC   How long can you walk comfortably? 30 min with [facility]   Diagnostic tests x-ray on 6/30   Patient Stated Goals to get the knee straight and get the swelling to go down   Currently in Pain? Yes   Pain Score 8   at worst 10 /10    Pain Location Knee   Pain Orientation Right   Pain Descriptors / Indicators Sharp   Pain Type Surgical pain;Chronic pain   Pain Onset More than a month ago   Pain Frequency Intermittent   Aggravating Factors  sleeping, straightening the knee   Pain Relieving Factors moving the knee around,             [facility] PT Assessment - 01/05/17 1025      Assessment   Medical Diagnosis S/p R TKA   Referring Provider Rodell Perna MD   Onset Date/Surgical Date --  10/10/2016   Hand Dominance Right   Next MD Visit --  Unsure when next appt is   Prior Therapy yes     Precautions   Precautions Knee   Precaution Comments avoid pushing down on  the knee     Restrictions   Weight Bearing Restrictions No     Balance Screen   Has the patient fallen in the past 6 months No   Has the patient had a decrease in activity level because of a fear of falling?  No   Is the patient reluctant to leave their home because of a fear of falling?  No     Home Environment   Living Environment Private residence   Living Arrangements Alone   Type of [place] One level   [place] - single point;Wheelchair - manual     Prior Function   Level of Independence Independent;Independent with basic ADLs   Vocation On disability   Leisure basketball, lifting weights     Cognition   Overall Cognitive Status Within Functional Limits for tasks assessed     Observation/Other Assessments   Focus on Therapeutic Outcomes (FOTO)  41% limited   Predicted 38% limited     Observation/Other Assessments-Edema    Edema Circumferential      Circumferential Edema   Circumferential - Right 46.8 cm: 10cm above knee, At knee 45.2 cm, 10 cm below knee 36.8cm     Posture/Postural Control   Posture/Postural Control Postural limitations   Postural Limitations Rounded Shoulders;Forward head     ROM / Strength   AROM / PROM / Strength AROM;PROM;Strength     AROM   AROM Assessment Site Knee   Right/Left Knee Right;Left   Right Knee Extension -28  pain in the back of the knee   Right Knee Flexion 118   Left Knee Extension -2   Left Knee Flexion 130     PROM   PROM Assessment Site Knee   Right/Left Knee Right   Right Knee Extension -26  pain in the back of the knee   Right Knee Flexion 121     Strength   Strength Assessment Site Knee;Hip   Right/Left Hip Right;Left   Right Hip Flexion 4-/5   Right Hip Extension 4-/5   Right Hip ABduction 3+/5   Right Hip ADduction 3+/5   Left Hip Extension 4/5   Left Hip ABduction 4/5   Left Hip ADduction 4/5   Right/Left Knee Right;Left   Right Knee Flexion 4/5  pain during testing   Right Knee Extension 4-/5  within available ROM, pain during testing   Left Knee Flexion 5/5   Left Knee Extension 5/5     Palpation   Palpation comment TTP along the anteromedial joint line, along the Quad tendon and distan quadricep vastus VMO and rectus femoris     Ambulation/Gait   Assistive device Straight cane   Gait Pattern Step-to pattern;Decreased stride length;Decreased stance time - right;Decreased step length - left;Trendelenburg;Antalgic;Trunk flexed;Decreased trunk rotation            Objective measurements completed on examination: See above findings.                  PT Education - 01/05/17 1049    Education provided Yes   Education Details evaluation findings, POC, goals, HEP with proper form/ rationale, and sleeping postion to avoid bending knee at night.    Person(s) Educated Patient   Methods Explanation;Verbal cues   Comprehension Verbalized  understanding;Verbal cues required          PT Short Term Goals - 01/05/17 1104      PT SHORT TERM GOAL #1  Title pt will be I with inital HEP (02/05/2017)   Time 4   Period Weeks   Status New     PT SHORT TERM GOAL #2   Title pt will verbalize/ demo techniques to reduce pain and inflamation via RICE and HEP (02/05/2017)   Time 4   Period Weeks   Status New     PT SHORT TERM GOAL #3   Title pt will increase R knee extension to </= -18 degrees with </= 6/10 pain to promote functional progression (02/05/2017)   Time 4   Period Weeks   Status New     PT SHORT TERM GOAL #4   Title pt will decrease L knee swelling by >/= 2 cm to promote knee mobility and reduce pain (02/05/2017)   Time 4   Period Weeks   Status New           PT Long Term Goals - 01/05/17 1132      PT LONG TERM GOAL #1   Title pt to increase R knee extension to </= -5 degrees to promote functional and efficient gait pattern (03/02/2017)   Time 8   Period Weeks   Status New     PT LONG TERM GOAL #2   Title pt to increase R knee/ hip strength to >/= 4/5 to promote hip/ knee stability with prolonged walking/ standing (03/02/2017)   Time 8   Period Weeks   Status New     PT LONG TERM GOAL #3   Title pt to walk/ standing >/= 45 min and navigate >/= 10 steps reciprocally with LRAD for safety with </= 2/10 pain for functional endurance required for community ambulation and ADLS (11/07/3147)   Time 8   Period Weeks   Status New     PT LONG TERM GOAL #4   Title pt to increase FOTO to </= 38% limited to demo improvement in function (03/02/2017)   Time 8   Period Weeks   Status New     PT LONG TERM GOAL #5   Title pt to be I with all HEP given as of last visit (03/02/2017)   Time 8   Period Weeks   Status New                Plan - 01/05/17 1058    Clinical Impression Statement Mr. Steadham presents to OPPT s/p R TKA on 10/10/2016. pt demonstrates flexion contracture with limited knee extension to -28  degrees with end range pain. R knee weakness within available ROM due to pain during testing and hip weakness. Antalgic Trendelenberg gait pattern with limited stride on L and stance on R with R trunk lean. pt would benefit from physical therapy to reduce R knee pain and tightness, improve knee extension and strength, improve gait efficiency and maximize his function by addressing the deficits listed.    History and Personal Factors relevant to plan of care: PHMx of HTN, arthritis, multiple joint replacements and revisions   Clinical Presentation Evolving   Clinical Presentation due to: abnormal gait, abnormal ROM following surgery, weakness, edema   Clinical Decision Making Moderate   Rehab Potential Good   PT Frequency 3x / week   PT Duration 8 weeks   PT Treatment/Interventions ADLs/Self Care Home Management;Cryotherapy;Electrical Stimulation;Iontophoresis 4mg /ml Dexamethasone;Moist Heat;Ultrasound;Dry needling;Manual techniques;Vasopneumatic Device;Taping;Patient/family education;Passive range of motion;Therapeutic activities;Therapeutic exercise;Neuromuscular re-education;Functional mobility training;Gait training;Stair training   PT Next Visit Plan assess/ review HEP and update PRN, knee extension mobs, hamstring stretching, manual over  hamstrings, hip/knee strength vaso fo pain and edema   PT Home Exercise Plan hamstring stretching (supine/ seated), standing gastroc stretch, quad set with elevation,    Consulted and Agree with Plan of Care Patient      Patient will benefit from skilled therapeutic intervention in order to improve the following deficits and impairments:  Abnormal gait, Pain, Improper body mechanics, Postural dysfunction, Increased edema, Decreased strength, Difficulty walking, Increased fascial restricitons, Decreased endurance, Decreased activity tolerance, Decreased balance  Visit Diagnosis: Chronic pain of right knee - Plan: PT plan of care cert/re-cert  Stiffness of  right knee, not elsewhere classified - Plan: PT plan of care cert/re-cert  Other abnormalities of gait and mobility - Plan: PT plan of care cert/re-cert  Localized edema - Plan: PT plan of care cert/re-cert      G-Codes - 81/01/75 1146    Functional Assessment Tool Used (Outpatient Only) ROM, FOTO    Functional Limitation Mobility: Walking and moving around   Mobility: Walking and Moving Around Current Status (Z0258) At least 60 percent but less than 80 percent impaired, limited or restricted   Mobility: Walking and Moving Around Goal Status [id number]) At least 20 percent but less than 40 percent impaired, limited or restricted       Problem List Patient Active Problem List   Diagnosis Date Noted  . S/P total knee arthroplasty, right 12/28/2016  . Right knee DJD 10/10/2016  . 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 OSA (obstructive sleep apnea) 12/09/2011  . Chronic bronchitis ([place]) 12/09/2011  . Tobacco abuse 12/09/2011   Starr Lake PT, DPT, LAT, ATC  01/05/17  11:48 AM      [facility] [address] [place], [place], [address] Phone: [phone]   Fax:  [phone]  Name: Peter Manning MRN: [id number] Date of Birth: [birth year]

## 2017-01-10 ENCOUNTER — Ambulatory Visit: Payer: Medicare Other | Admitting: Physical Therapy

## 2017-01-10 DIAGNOSIS — M25561 Pain in right knee: Principal | ICD-10-CM

## 2017-01-10 DIAGNOSIS — R2689 Other abnormalities of gait and mobility: Secondary | ICD-10-CM

## 2017-01-10 DIAGNOSIS — M25661 Stiffness of right knee, not elsewhere classified: Secondary | ICD-10-CM

## 2017-01-10 DIAGNOSIS — G8929 Other chronic pain: Secondary | ICD-10-CM

## 2017-01-10 DIAGNOSIS — R600 Localized edema: Secondary | ICD-10-CM

## 2017-01-11 ENCOUNTER — Ambulatory Visit (INDEPENDENT_AMBULATORY_CARE_PROVIDER_SITE_OTHER): Payer: Medicare Other | Admitting: Orthopaedic Surgery

## 2017-01-11 NOTE — Therapy (Signed)
[place], [place], [address] Phone: [phone]   Fax:  [phone]  Physical Therapy Treatment  Patient Details  Name: Peter Manning MRN: [id number] Date of Birth: [birth year] Referring Provider: Rodell Perna MD  Encounter Date: 01/10/2017      PT End of Session - 01/10/17 1421    Visit Number 2   Number of Visits 18   Date for PT Re-Evaluation 03/02/17   Authorization Type MCR: Kx mod by 15th visit, Progress note by 10th visit or 8/5   PT Start Time 0215   PT Stop Time 0310   PT Time Calculation (min) 55 min      Past Medical History:  Diagnosis Date  . Arthritis   . Bronchitis   . Hepatitis 1976   pt. doesn't remember if A, B, C?  . History of kidney stones   . Hyperlipidemia   . Hypertension   . OSA (obstructive sleep apnea) 12/09/2011   PSG 01/03/12>>AHI 45.7, SpO2 low 73%, PLMI 0.  CPAP 15 cm H2O>>AHI 0, +R.   Marland Kitchen Pneumonia 2015  . Sleep apnea     Past Surgical History:  Procedure Laterality Date  . BACK SURGERY    . JOINT REPLACEMENT    . REVISION TOTAL HIP ARTHROPLASTY Left 11/2015  . TOTAL HIP ARTHROPLASTY    . TOTAL HIP REVISION Left 11/18/2015   Procedure: LEFT TOTAL HIP REVISION;  Surgeon: Marybelle Killings, MD;  Location: [place];  Service: Orthopedics;  Laterality: Left;  . TOTAL KNEE ARTHROPLASTY Right 10/10/2016   Procedure: TOTAL KNEE ARTHROPLASTY;  Surgeon: Marybelle Killings, MD;  Location: [place];  Service: Orthopedics;  Laterality: Right;    There were no vitals filed for this visit.      Subjective Assessment - 01/10/17 1419    Subjective Still in pain, doing the exercises.    Currently in Pain? Yes   Pain Score 8    Pain Location Knee   Aggravating Factors  laying down, prolonged walking    Pain Relieving Factors sit with knees bent             [facility] PT Assessment - 01/10/17 0001      AROM   Right Knee Extension -20   Right Knee Flexion 118     PROM   Right Knee  Extension -18                     [facility] Adult PT Treatment/Exercise - 01/10/17 0001      Knee/Hip Exercises: Stretches   Active Hamstring Stretch 3 reps;30 seconds   Active Hamstring Stretch Limitations supine with strap   Gastroc Stretch Limitations runners stretch at wall x 30 x 3      Knee/Hip Exercises: Aerobic   Nustep L3 LE only x 5 minutes      Knee/Hip Exercises: Seated   Other Seated Knee/Hip Exercises seated quad set with heel on floor  10 x 10 sec      Knee/Hip Exercises: Supine   Heel Slides 10 reps   Terminal Knee Extension Limitations heel prop pushing into pillow      Manual Therapy   Manual Therapy Joint mobilization   Joint Mobilization A/P mobs to increase extension, using bolster under calf.       Ice pack x 10 minutes Right knee             PT Short Term Goals - 01/05/17 1104  PT SHORT TERM GOAL #1   Title pt will be I with inital HEP (02/05/2017)   Time 4   Period Weeks   Status New     PT SHORT TERM GOAL #2   Title pt will verbalize/ demo techniques to reduce pain and inflamation via RICE and HEP (02/05/2017)   Time 4   Period Weeks   Status New     PT SHORT TERM GOAL #3   Title pt will increase R knee extension to </= -18 degrees with </= 6/10 pain to promote functional progression (02/05/2017)   Time 4   Period Weeks   Status New     PT SHORT TERM GOAL #4   Title pt will decrease L knee swelling by >/= 2 cm to promote knee mobility and reduce pain (02/05/2017)   Time 4   Period Weeks   Status New           PT Long Term Goals - 01/05/17 1132      PT LONG TERM GOAL #1   Title pt to increase R knee extension to </= -5 degrees to promote functional and efficient gait pattern (03/02/2017)   Time 8   Period Weeks   Status New     PT LONG TERM GOAL #2   Title pt to increase R knee/ hip strength to >/= 4/5 to promote hip/ knee stability with prolonged walking/ standing (03/02/2017)   Time 8   Period Weeks   Status  New     PT LONG TERM GOAL #3   Title pt to walk/ standing >/= 45 min and navigate >/= 10 steps reciprocally with LRAD for safety with </= 2/10 pain for functional endurance required for community ambulation and ADLS (07/09/1094)   Time 8   Period Weeks   Status New     PT LONG TERM GOAL #4   Title pt to increase FOTO to </= 38% limited to demo improvement in function (03/02/2017)   Time 8   Period Weeks   Status New     PT LONG TERM GOAL #5   Title pt to be I with all HEP given as of last visit (03/02/2017)   Time 8   Period Weeks   Status New               Plan - 01/11/17 0454    Clinical Impression Statement Review of HEP and continued focus on extension with mobilizations. His knee extension has improved, still liimited.    PT Next Visit Plan assess/ review HEP and update PRN, knee extension mobs, hamstring stretching, manual over hamstrings, hip/knee strength vaso fo pain and edema   PT Home Exercise Plan hamstring stretching (supine/ seated), standing gastroc stretch, quad set with elevation,    Consulted and Agree with Plan of Care Patient      Patient will benefit from skilled therapeutic intervention in order to improve the following deficits and impairments:  Abnormal gait, Pain, Improper body mechanics, Postural dysfunction, Increased edema, Decreased strength, Difficulty walking, Increased fascial restricitons, Decreased endurance, Decreased activity tolerance, Decreased balance  Visit Diagnosis: Chronic pain of right knee  Stiffness of right knee, not elsewhere classified  Other abnormalities of gait and mobility  Localized edema     Problem List Patient Active Problem List   Diagnosis Date Noted  . S/P total knee arthroplasty, right 12/28/2016  . Right knee DJD 10/10/2016  . 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 OSA (obstructive sleep apnea) 12/09/2011  .  Chronic bronchitis ([place]) 12/09/2011  . Tobacco abuse 12/09/2011    Dorene Ar,  PTA 01/11/2017, 7:39 AM  [place], [place], [address] Phone: [phone]   Fax:  [phone]  Name: Peter Manning MRN: [id number] Date of Birth: May 05, 1956

## 2017-01-16 ENCOUNTER — Ambulatory Visit: Payer: Medicare Other | Admitting: Physical Therapy

## 2017-01-16 ENCOUNTER — Encounter: Payer: Self-pay | Admitting: Physical Therapy

## 2017-01-16 DIAGNOSIS — R600 Localized edema: Secondary | ICD-10-CM

## 2017-01-16 DIAGNOSIS — M25561 Pain in right knee: Secondary | ICD-10-CM | POA: Diagnosis not present

## 2017-01-16 DIAGNOSIS — G8929 Other chronic pain: Secondary | ICD-10-CM

## 2017-01-16 DIAGNOSIS — M25661 Stiffness of right knee, not elsewhere classified: Secondary | ICD-10-CM

## 2017-01-16 DIAGNOSIS — R2689 Other abnormalities of gait and mobility: Secondary | ICD-10-CM

## 2017-01-16 NOTE — Therapy (Signed)
[place], [place], [address] Phone: [phone]   Fax:  [phone]  Physical Therapy Treatment  Patient Details  Name: Peter Manning MRN: [id number] Date of Birth: May 16, 1956 Referring Provider: Rodell Perna MD  Encounter Date: 01/16/2017      PT End of Session - 01/16/17 1155    Visit Number 3   Number of Visits 18   Date for PT Re-Evaluation 03/02/17   PT Start Time 1100   PT Stop Time 1157   PT Time Calculation (min) 57 min   Behavior During Therapy [facility] for tasks assessed/performed      Past Medical History:  Diagnosis Date  . Arthritis   . Bronchitis   . Hepatitis 1976   pt. doesn't remember if A, B, C?  . History of kidney stones   . Hyperlipidemia   . Hypertension   . OSA (obstructive sleep apnea) 12/09/2011   PSG 01/03/12>>AHI 45.7, SpO2 low 73%, PLMI 0.  CPAP 15 cm H2O>>AHI 0, +R.   Marland Kitchen Pneumonia 2015  . Sleep apnea     Past Surgical History:  Procedure Laterality Date  . BACK SURGERY    . JOINT REPLACEMENT    . REVISION TOTAL HIP ARTHROPLASTY Left 11/2015  . TOTAL HIP ARTHROPLASTY    . TOTAL HIP REVISION Left 11/18/2015   Procedure: LEFT TOTAL HIP REVISION;  Surgeon: Marybelle Killings, MD;  Location: [place];  Service: Orthopedics;  Laterality: Left;  . TOTAL KNEE ARTHROPLASTY Right 10/10/2016   Procedure: TOTAL KNEE ARTHROPLASTY;  Surgeon: Marybelle Killings, MD;  Location: [place];  Service: Orthopedics;  Laterality: Right;    There were no vitals filed for this visit.      Subjective Assessment - 01/16/17 1103    Subjective 9/10 pain.  Concerned about his swelling.  When he walks he gets swelling.   Currently in Pain? Yes   Pain Score 9    Pain Location Knee   Pain Orientation Right   Pain Descriptors / Indicators Sharp;Aching;Throbbing   Pain Type Surgical pain   Pain Frequency Constant   Aggravating Factors  longer walking,  laying down   Pain Relieving Factors medication             [facility] PT Assessment - 01/16/17 0001      Circumferential Edema   Circumferential - Right 36.5 cm at 10 cm below patella border inferior,  and 10 cm above patella border superior 50 cml                     [facility] Adult PT Treatment/Exercise - 01/16/17 0001      Ambulation/Gait   Pre-Gait Activities WALL SLIDES facing wall 2 sets of 10 x with brief rest.    Gait Comments Gait improved with increased strides,  Noted forward trunk lean with gait.  No cane used .     Knee/Hip Exercises: Stretches   Passive Hamstring Stretch 3 reps;30 seconds  supine with strap   Hip Flexor Stretch 2 reps;30 seconds;Right     Knee/Hip Exercises: Seated   Heel Slides Right     Cryotherapy   Number Minutes Cryotherapy 5 Minutes   Cryotherapy Location Knee   Type of Cryotherapy --  cold pack,      Manual Therapy   Manual Therapy Edema management;Soft tissue mobilization;Passive ROM;Taping   Manual therapy comments taping, quad activation and edema (2 fans medial lateral knee)  .  PROM FOR EXTENSION  Edema Management RICE reviewed,  girth 45 cm mid patella.    Soft tissue mobilization retrograde soft tissue work proximal to distal thigh   Passive ROM extension stretch                 PT Education - 01/16/17 1155    Education provided Yes   Education Details HEP   Person(s) Educated Patient   Methods Explanation;Verbal cues   Comprehension Verbalized understanding;Returned demonstration          PT Short Term Goals - 01/16/17 1204      PT SHORT TERM GOAL #1   Time 4           PT Long Term Goals - 01/05/17 1132      PT LONG TERM GOAL #1   Title pt to increase R knee extension to </= -5 degrees to promote functional and efficient gait pattern (03/02/2017)   Time 8   Period Weeks   Status New     PT LONG TERM GOAL #2   Title pt to increase R knee/ hip strength to >/= 4/5 to promote hip/ knee stability with prolonged walking/ standing (03/02/2017)   Time 8    Period Weeks   Status New     PT LONG TERM GOAL #3   Title pt to walk/ standing >/= 45 min and navigate >/= 10 steps reciprocally with LRAD for safety with </= 2/10 pain for functional endurance required for community ambulation and ADLS (0/0/9233)   Time 8   Period Weeks   Status New     PT LONG TERM GOAL #4   Title pt to increase FOTO to </= 38% limited to demo improvement in function (03/02/2017)   Time 8   Period Weeks   Status New     PT LONG TERM GOAL #5   Title pt to be I with all HEP given as of last visit (03/02/2017)   Time 8   Period Weeks   Status New               Plan - 01/16/17 1156    Clinical Impression Statement 45 cm edema mid patella right.  Patient has not been using ice at home.  Knee slightly warm.  Taping should help.  Extension focus today with the addition of closed chain exercises.  Some of extension ROM is due to hip flexion.  After stretching,  able to get right thigh on table with foot off mat.   (Pain 8/10  post session.  (Patietn does not demonstrate physically the high pain numbers)   PT Treatment/Interventions ADLs/Self Care Home Management;Cryotherapy;Electrical Stimulation;Iontophoresis 4mg /ml Dexamethasone;Moist Heat;Ultrasound;Dry needling;Manual techniques;Vasopneumatic Device;Taping;Patient/family education;Passive range of motion;Therapeutic activities;Therapeutic exercise;Neuromuscular re-education;Functional mobility training;Gait training;Stair training   PT Next Visit Plan assess/ review HEP and update PRN, knee extension mobs, hamstring stretching, manual over hamstrings, hip/knee strength vaso fo pain and edema   PT Home Exercise Plan hamstring stretching (supine/ seated), standing gastroc stretch, quad set with elevation, wall slides ( pre-gait)   Consulted and Agree with Plan of Care Patient      Patient will benefit from skilled therapeutic intervention in order to improve the following deficits and impairments:  Abnormal gait,  Pain, Improper body mechanics, Postural dysfunction, Increased edema, Decreased strength, Difficulty walking, Increased fascial restricitons, Decreased endurance, Decreased activity tolerance, Decreased balance  Visit Diagnosis: Chronic pain of right knee  Stiffness of right knee, not elsewhere classified  Other abnormalities of gait and mobility  Localized edema  Problem List Patient Active Problem List   Diagnosis Date Noted  . S/P total knee arthroplasty, right 12/28/2016  . Right knee DJD 10/10/2016  . 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 OSA (obstructive sleep apnea) 12/09/2011  . Chronic bronchitis ([place]) 12/09/2011  . Tobacco abuse 12/09/2011    HARRIS,KAREN PTA 01/16/2017, 12:12 PM  [facility] [address] [place], [place], [address] Phone: [phone]   Fax:  [phone]  Name: Nosson Wender MRN: [id number] Date of Birth: 1955/12/25

## 2017-01-16 NOTE — Patient Instructions (Signed)
Issued wall slides , pre gait facing wall.  Issued from exercise drawer. 10-30 x each 2 x a day each leg.

## 2017-01-17 ENCOUNTER — Ambulatory Visit: Payer: Medicare Other | Admitting: Physical Therapy

## 2017-01-17 DIAGNOSIS — M25561 Pain in right knee: Principal | ICD-10-CM

## 2017-01-17 DIAGNOSIS — R2689 Other abnormalities of gait and mobility: Secondary | ICD-10-CM

## 2017-01-17 DIAGNOSIS — R600 Localized edema: Secondary | ICD-10-CM

## 2017-01-17 DIAGNOSIS — M25661 Stiffness of right knee, not elsewhere classified: Secondary | ICD-10-CM

## 2017-01-17 DIAGNOSIS — G8929 Other chronic pain: Secondary | ICD-10-CM

## 2017-01-17 NOTE — Therapy (Signed)
[place], [place], [address] Phone: [phone]   Fax:  [phone]  Physical Therapy Treatment  Patient Details  Name: Peter Manning MRN: [id number] Date of Birth: 06/19/1956 Referring Provider: Rodell Perna MD  Encounter Date: 01/17/2017      PT End of Session - 01/17/17 1517    Visit Number 4   Number of Visits 18   Date for PT Re-Evaluation 03/02/17   Authorization Type MCR: Kx mod by 15th visit, Progress note by 10th visit or 8/5   PT Start Time 0130   PT Stop Time 0230   PT Time Calculation (min) 60 min      Past Medical History:  Diagnosis Date  . Arthritis   . Bronchitis   . Hepatitis 1976   pt. doesn't remember if A, B, C?  . History of kidney stones   . Hyperlipidemia   . Hypertension   . OSA (obstructive sleep apnea) 12/09/2011   PSG 01/03/12>>AHI 45.7, SpO2 low 73%, PLMI 0.  CPAP 15 cm H2O>>AHI 0, +R.   Marland Kitchen Pneumonia 2015  . Sleep apnea     Past Surgical History:  Procedure Laterality Date  . BACK SURGERY    . JOINT REPLACEMENT    . REVISION TOTAL HIP ARTHROPLASTY Left 11/2015  . TOTAL HIP ARTHROPLASTY    . TOTAL HIP REVISION Left 11/18/2015   Procedure: LEFT TOTAL HIP REVISION;  Surgeon: Marybelle Killings, MD;  Location: [facility];  Service: Orthopedics;  Laterality: Left;  . TOTAL KNEE ARTHROPLASTY Right 10/10/2016   Procedure: TOTAL KNEE ARTHROPLASTY;  Surgeon: Marybelle Killings, MD;  Location: [facility];  Service: Orthopedics;  Laterality: Right;    There were no vitals filed for this visit.      Subjective Assessment - 01/17/17 1336    Subjective Sometimes its achy when i walk and do the Nustep but it is feeling alright. I took the tape off when I took a shower.    Currently in Pain? Yes   Pain Score 8   hurts a little bit    Pain Location Knee   Pain Orientation Right   Pain Descriptors / Indicators Aching            [facility] PT Assessment - 01/17/17 0001      AROM   Right Knee  Extension -18   Right Knee Flexion 118                     [facility] Adult PT Treatment/Exercise - 01/17/17 0001      Knee/Hip Exercises: Stretches   Active Hamstring Stretch 3 reps;30 seconds   Active Hamstring Stretch Limitations supine with strap   Hip Flexor Stretch 2 reps;30 seconds;Right   Gastroc Stretch Limitations slant board stretch and supine with strap      Knee/Hip Exercises: Aerobic   Nustep L4 LE/UE  x 5 minutes      Knee/Hip Exercises: Machines for Strengthening   Total Gym Leg Press 1 plate bilateral and single      Knee/Hip Exercises: Seated   Long Arc Quad 20 reps   Long Arc Quad Weight 5 lbs.   Other Seated Knee/Hip Exercises seated quad set with heel on floor  10 x 10 sec      Knee/Hip Exercises: Supine   Terminal Knee Extension Limitations heel prop pushing into pillow    Bridges Limitations x10     Modalities   Modalities Vasopneumatic  Cryotherapy   Cryotherapy Location Knee     Vasopneumatic   Number Minutes Vasopneumatic  15 minutes   Vasopnuematic Location  Knee   Vasopneumatic Pressure Low   Vasopneumatic Temperature  38     Manual Therapy   Joint Mobilization A/P mobs to increase extension, using bolster under calf.                 PT Education - 01/16/17 1155    Education provided Yes   Education Details HEP   Person(s) Educated Patient   Methods Explanation;Verbal cues   Comprehension Verbalized understanding;Returned demonstration             PT Long Term Goals - 01/05/17 1132      PT LONG TERM GOAL #1   Title pt to increase R knee extension to </= -5 degrees to promote functional and efficient gait pattern (03/02/2017)   Time 8   Period Weeks   Status New     PT LONG TERM GOAL #2   Title pt to increase R knee/ hip strength to >/= 4/5 to promote hip/ knee stability with prolonged walking/ standing (03/02/2017)   Time 8   Period Weeks   Status New     PT LONG TERM GOAL #3   Title pt to walk/  standing >/= 45 min and navigate >/= 10 steps reciprocally with LRAD for safety with </= 2/10 pain for functional endurance required for community ambulation and ADLS (11/08/8467)   Time 8   Period Weeks   Status New     PT LONG TERM GOAL #4   Title pt to increase FOTO to </= 38% limited to demo improvement in function (03/02/2017)   Time 8   Period Weeks   Status New     PT LONG TERM GOAL #5   Title pt to be I with all HEP given as of last visit (03/02/2017)   Time 8   Period Weeks   Status New               Plan - 01/17/17 1517    Clinical Impression Statement Focued knee extension. Very tight in calf. Strethched all calf, hamstring and hip flexors and performed manual extension mobs. Small changed in extension ROM. Trial of vaso for emeda.    PT Next Visit Plan assess response to vaso. assess/ review HEP and update PRN, knee extension mobs, hamstring stretching, manual over hamstrings, hip/knee strength vaso fo pain and edema   PT Home Exercise Plan hamstring stretching (supine/ seated), standing gastroc stretch, quad set with elevation, wall slides ( pre-gait)   Consulted and Agree with Plan of Care Patient      Patient will benefit from skilled therapeutic intervention in order to improve the following deficits and impairments:  Abnormal gait, Pain, Improper body mechanics, Postural dysfunction, Increased edema, Decreased strength, Difficulty walking, Increased fascial restricitons, Decreased endurance, Decreased activity tolerance, Decreased balance  Visit Diagnosis: Chronic pain of right knee  Stiffness of right knee, not elsewhere classified  Other abnormalities of gait and mobility  Localized edema     Problem List Patient Active Problem List   Diagnosis Date Noted  . S/P total knee arthroplasty, right 12/28/2016  . Right knee DJD 10/10/2016  . 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 OSA (obstructive sleep apnea) 12/09/2011  . Chronic bronchitis ([place])  12/09/2011  . Tobacco abuse 12/09/2011    Dorene Ar, PTA 01/17/2017, 3:19 PM  [facility]  [place], [place], [address] Phone: [phone]   Fax:  [phone]  Name: Davi Kroon MRN: [id number] Date of Birth: 05-30-1956

## 2017-01-18 ENCOUNTER — Ambulatory Visit: Payer: Medicare Other | Admitting: Physical Therapy

## 2017-01-18 DIAGNOSIS — M25561 Pain in right knee: Secondary | ICD-10-CM | POA: Diagnosis not present

## 2017-01-18 DIAGNOSIS — G8929 Other chronic pain: Secondary | ICD-10-CM

## 2017-01-18 DIAGNOSIS — R600 Localized edema: Secondary | ICD-10-CM

## 2017-01-18 DIAGNOSIS — M25661 Stiffness of right knee, not elsewhere classified: Secondary | ICD-10-CM

## 2017-01-18 DIAGNOSIS — R2689 Other abnormalities of gait and mobility: Secondary | ICD-10-CM

## 2017-01-19 NOTE — Therapy (Signed)
[place], [place], [address] Phone: [phone]   Fax:  [phone]  Physical Therapy Treatment  Patient Details  Name: Peter Manning MRN: [id number] Date of Birth: 26-Jan-1956 Referring Provider: Rodell Perna MD  Encounter Date: 01/18/2017      PT End of Session - 01/18/17 0746    Visit Number 5   Number of Visits 18   Date for PT Re-Evaluation 03/02/17   Authorization Type MCR: Kx mod by 15th visit, Progress note by 10th visit or 8/5   PT Start Time 0845   PT Stop Time 0945   PT Time Calculation (min) 60 min      Past Medical History:  Diagnosis Date  . Arthritis   . Bronchitis   . Hepatitis 1976   pt. doesn't remember if A, B, C?  . History of kidney stones   . Hyperlipidemia   . Hypertension   . OSA (obstructive sleep apnea) 12/09/2011   PSG 01/03/12>>AHI 45.7, SpO2 low 73%, PLMI 0.  CPAP 15 cm H2O>>AHI 0, +R.   Marland Kitchen Pneumonia 2015  . Sleep apnea     Past Surgical History:  Procedure Laterality Date  . BACK SURGERY    . JOINT REPLACEMENT    . REVISION TOTAL HIP ARTHROPLASTY Left 11/2015  . TOTAL HIP ARTHROPLASTY    . TOTAL HIP REVISION Left 11/18/2015   Procedure: LEFT TOTAL HIP REVISION;  Surgeon: Marybelle Killings, MD;  Location: [facility];  Service: Orthopedics;  Laterality: Left;  . TOTAL KNEE ARTHROPLASTY Right 10/10/2016   Procedure: TOTAL KNEE ARTHROPLASTY;  Surgeon: Marybelle Killings, MD;  Location: [facility];  Service: Orthopedics;  Laterality: Right;    There were no vitals filed for this visit.      Subjective Assessment - 01/18/17 1024    Subjective Still hurts    Currently in Pain? Yes   Pain Score 7    Pain Location Knee   Pain Orientation Right   Pain Descriptors / Indicators Aching                         [facility] Adult PT Treatment/Exercise - 01/18/17 0001      Knee/Hip Exercises: Stretches   Gastroc Stretch Limitations slant board stretch 3 x 30      Knee/Hip  Exercises: Aerobic   Nustep L5 LE/UE  x 8 minutes      Knee/Hip Exercises: Machines for Strengthening   Cybex Knee Extension 5# 10 x 3    Cybex Knee Flexion 20# bilateral and single multiple reps    Total Gym Leg Press 1 plate bilateral and single      Knee/Hip Exercises: Standing   Wall Squat 10 reps;2 sets   Other Standing Knee Exercises Terminal knee ext ball to wall 10 x 2      Knee/Hip Exercises: Supine   Heel Slides 10 reps   Terminal Knee Extension Limitations heel prop pushing into pillow      Vasopneumatic   Number Minutes Vasopneumatic  15 minutes   Vasopnuematic Location  Knee   Vasopneumatic Pressure Low   Vasopneumatic Temperature  48                    PT Long Term Goals - 01/05/17 1132      PT LONG TERM GOAL #1   Title pt to increase R knee extension to </= -5 degrees to promote functional and efficient gait  pattern (03/02/2017)   Time 8   Period Weeks   Status New     PT LONG TERM GOAL #2   Title pt to increase R knee/ hip strength to >/= 4/5 to promote hip/ knee stability with prolonged walking/ standing (03/02/2017)   Time 8   Period Weeks   Status New     PT LONG TERM GOAL #3   Title pt to walk/ standing >/= 45 min and navigate >/= 10 steps reciprocally with LRAD for safety with </= 2/10 pain for functional endurance required for community ambulation and ADLS (3/0/0762)   Time 8   Period Weeks   Status New     PT LONG TERM GOAL #4   Title pt to increase FOTO to </= 38% limited to demo improvement in function (03/02/2017)   Time 8   Period Weeks   Status New     PT LONG TERM GOAL #5   Title pt to be I with all HEP given as of last visit (03/02/2017)   Time 8   Period Weeks   Status New               Plan - 01/19/17 2633    Clinical Impression Statement Focused knee strengthening and knee extension as tolerated. Vaso at the end of treatment for edema management. Slow improvement in knee extension.    PT Next Visit Plan  continue vaso/edema mgmt. Marland Kitchen assess/ review HEP and update PRN, knee extension mobs, hamstring stretching, manual over hamstrings, hip/knee strength vaso fo pain and edema   PT Home Exercise Plan hamstring stretching (supine/ seated), standing gastroc stretch, quad set with elevation, wall slides ( pre-gait)   Consulted and Agree with Plan of Care Patient      Patient will benefit from skilled therapeutic intervention in order to improve the following deficits and impairments:  Abnormal gait, Pain, Improper body mechanics, Postural dysfunction, Increased edema, Decreased strength, Difficulty walking, Increased fascial restricitons, Decreased endurance, Decreased activity tolerance, Decreased balance  Visit Diagnosis: Chronic pain of right knee  Stiffness of right knee, not elsewhere classified  Other abnormalities of gait and mobility  Localized edema     Problem List Patient Active Problem List   Diagnosis Date Noted  . S/P total knee arthroplasty, right 12/28/2016  . Right knee DJD 10/10/2016  . 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 OSA (obstructive sleep apnea) 12/09/2011  . Chronic bronchitis ([place]) 12/09/2011  . Tobacco abuse 12/09/2011    Dorene Ar, PTA 01/19/2017, 7:49 AM  [place], [place], [address] Phone: [phone]   Fax:  [phone]  Name: Peter Manning MRN: [id number] Date of Birth: April 12, 1956

## 2017-01-20 ENCOUNTER — Telehealth (INDEPENDENT_AMBULATORY_CARE_PROVIDER_SITE_OTHER): Payer: Self-pay | Admitting: Orthopaedic Surgery

## 2017-01-20 NOTE — Telephone Encounter (Signed)
last 5 ov notes faxed to [facility]

## 2017-01-23 ENCOUNTER — Ambulatory Visit: Payer: Medicare Other | Admitting: Physical Therapy

## 2017-01-23 ENCOUNTER — Encounter: Payer: Self-pay | Admitting: Physical Therapy

## 2017-01-23 DIAGNOSIS — M25561 Pain in right knee: Principal | ICD-10-CM

## 2017-01-23 DIAGNOSIS — R600 Localized edema: Secondary | ICD-10-CM

## 2017-01-23 DIAGNOSIS — G8929 Other chronic pain: Secondary | ICD-10-CM

## 2017-01-23 DIAGNOSIS — R2689 Other abnormalities of gait and mobility: Secondary | ICD-10-CM

## 2017-01-23 DIAGNOSIS — M25661 Stiffness of right knee, not elsewhere classified: Secondary | ICD-10-CM

## 2017-01-23 NOTE — Therapy (Signed)
[place], [place], [address] Phone: [phone]   Fax:  [phone]  Physical Therapy Treatment  Patient Details  Name: Keyshun Elpers MRN: [id number] Date of Birth: 1956/05/14 Referring Provider: Rodell Perna MD  Encounter Date: 01/23/2017      PT End of Session - 01/23/17 1312    Visit Number 6   Number of Visits 18   Date for PT Re-Evaluation 03/02/17   PT Start Time 1100   PT Stop Time 1200   PT Time Calculation (min) 60 min   Activity Tolerance Patient tolerated treatment well   Behavior During Therapy [facility] for tasks assessed/performed      Past Medical History:  Diagnosis Date  . Arthritis   . Bronchitis   . Hepatitis 1976   pt. doesn't remember if A, B, C?  . History of kidney stones   . Hyperlipidemia   . Hypertension   . OSA (obstructive sleep apnea) 12/09/2011   PSG 01/03/12>>AHI 45.7, SpO2 low 73%, PLMI 0.  CPAP 15 cm H2O>>AHI 0, +R.   Marland Kitchen Pneumonia 2015  . Sleep apnea     Past Surgical History:  Procedure Laterality Date  . BACK SURGERY    . JOINT REPLACEMENT    . REVISION TOTAL HIP ARTHROPLASTY Left 11/2015  . TOTAL HIP ARTHROPLASTY    . TOTAL HIP REVISION Left 11/18/2015   Procedure: LEFT TOTAL HIP REVISION;  Surgeon: Marybelle Killings, MD;  Location: [facility];  Service: Orthopedics;  Laterality: Left;  . TOTAL KNEE ARTHROPLASTY Right 10/10/2016   Procedure: TOTAL KNEE ARTHROPLASTY;  Surgeon: Marybelle Killings, MD;  Location: [facility];  Service: Orthopedics;  Laterality: Right;    There were no vitals filed for this visit.      Subjective Assessment - 01/23/17 1115    Subjective Back 8/10.  No knee pain.  He is going to take a longer acting pain medication starting later today.  (From the pain clinic)   Currently in Pain? No/denies   Pain Score 0-No pain   Pain Location Knee   Pain Orientation Right   Multiple Pain Sites --  has 8/10 back pain he sees pain management for this             [facility] PT Assessment - 01/23/17 0001      Circumferential Edema   Circumferential - Right 37.5 10 cm bellow inferior patellar border,,  55 cm girth at 10 cm superior patellar border                     [facility] Adult PT Treatment/Exercise - 01/23/17 0001      Knee/Hip Exercises: Stretches   Passive Hamstring Stretch 3 reps;30 seconds   Gastroc Stretch 3 reps;30 seconds   Gastroc Stretch Limitations slant board     Knee/Hip Exercises: Aerobic   Nustep L6 LE/UE, 6 minutes     Knee/Hip Exercises: Machines for Strengthening   Total Gym Leg Press 1 plate both 20 x     Knee/Hip Exercises: Standing   Forward Step Up Right;1 set;10 reps;Hand Hold: 2;Step Height: 6"   Functional Squat 1 set;10 reps   Functional Squat Limitations cues   Other Standing Knee Exercises Wall slides standing,  at sink. hands on counter 6X stopped due to increased back pain     Knee/Hip Exercises: Supine   Quad Sets 10 reps   Quad Sets Limitations press into roll    Heel Slides 5 reps  Modalities   Modalities Vasopneumatic     Vasopneumatic   Number Minutes Vasopneumatic  15 minutes   Vasopnuematic Location  Knee   Vasopneumatic Pressure Low   Vasopneumatic Temperature  48     Manual Therapy   Manual Therapy Edema management   Manual therapy comments patellar mobs   Joint Mobilization A/P mobs to increase extension, using bolster under calf.    Passive ROM extension stretch                  PT Short Term Goals - 01/23/17 1317      PT SHORT TERM GOAL #1   Title pt will be I with inital HEP (02/05/2017)   Baseline independent with exercises so far.     Time 4   Period Weeks   Status On-going     PT SHORT TERM GOAL #2   Title pt will verbalize/ demo techniques to reduce pain and inflamation via RICE and HEP (02/05/2017)   Time 4   Period Weeks   Status Unable to assess     PT SHORT TERM GOAL #3   Title pt will increase R knee extension to </= -18 degrees with </= 6/10  pain to promote functional progression (02/05/2017)   Baseline mpore than -18 degrees   Time 4   Period Weeks   Status On-going     PT SHORT TERM GOAL #4   Title pt will decrease L knee swelling by >/= 2 cm to promote knee mobility and reduce pain (02/05/2017)   Baseline does not measure smaller   Time 4   Period Weeks   Status On-going           PT Long Term Goals - 01/05/17 1132      PT LONG TERM GOAL #1   Title pt to increase R knee extension to </= -5 degrees to promote functional and efficient gait pattern (03/02/2017)   Time 8   Period Weeks   Status New     PT LONG TERM GOAL #2   Title pt to increase R knee/ hip strength to >/= 4/5 to promote hip/ knee stability with prolonged walking/ standing (03/02/2017)   Time 8   Period Weeks   Status New     PT LONG TERM GOAL #3   Title pt to walk/ standing >/= 45 min and navigate >/= 10 steps reciprocally with LRAD for safety with </= 2/10 pain for functional endurance required for community ambulation and ADLS (03/08/6212)   Time 8   Period Weeks   Status New     PT LONG TERM GOAL #4   Title pt to increase FOTO to </= 38% limited to demo improvement in function (03/02/2017)   Time 8   Period Weeks   Status New     PT LONG TERM GOAL #5   Title pt to be I with all HEP given as of last visit (03/02/2017)   Time 8   Period Weeks   Status New               Plan - 01/23/17 1312    Clinical Impression Statement Edema appears improved however continues to be elevated.  Extension continues to be limited.    PT Treatment/Interventions ADLs/Self Care Home Management;Cryotherapy;Electrical Stimulation;Iontophoresis 4mg /ml Dexamethasone;Moist Heat;Ultrasound;Dry needling;Manual techniques;Vasopneumatic Device;Taping;Patient/family education;Passive range of motion;Therapeutic activities;Therapeutic exercise;Neuromuscular re-education;Functional mobility training;Gait training;Stair training   PT Next Visit Plan continue  vaso/edema mgmt. Marland Kitchen assess/ review HEP and update PRN, knee extension  mobs, hamstring stretching, manual over hamstrings, hip/knee strength vaso fo pain and edema   PT Home Exercise Plan hamstring stretching (supine/ seated), standing gastroc stretch, quad set with elevation, wall slides ( pre-gait)   Consulted and Agree with Plan of Care Patient      Patient will benefit from skilled therapeutic intervention in order to improve the following deficits and impairments:  Abnormal gait, Pain, Improper body mechanics, Postural dysfunction, Increased edema, Decreased strength, Difficulty walking, Increased fascial restricitons, Decreased endurance, Decreased activity tolerance, Decreased balance  Visit Diagnosis: Chronic pain of right knee  Stiffness of right knee, not elsewhere classified  Other abnormalities of gait and mobility  Localized edema     Problem List Patient Active Problem List   Diagnosis Date Noted  . S/P total knee arthroplasty, right 12/28/2016  . Right knee DJD 10/10/2016  . 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 OSA (obstructive sleep apnea) 12/09/2011  . Chronic bronchitis ([place]) 12/09/2011  . Tobacco abuse 12/09/2011    Taliyah Watrous PTA 01/23/2017, 1:21 PM  [facility] [address] [place], [place], [address] Phone: [phone]   Fax:  [phone]  Name: Ming Mcmannis MRN: [id number] Date of Birth: 04-Nov-1955

## 2017-01-25 ENCOUNTER — Ambulatory Visit: Payer: Medicare Other | Admitting: Physical Therapy

## 2017-01-25 DIAGNOSIS — R600 Localized edema: Secondary | ICD-10-CM

## 2017-01-25 DIAGNOSIS — R2689 Other abnormalities of gait and mobility: Secondary | ICD-10-CM

## 2017-01-25 DIAGNOSIS — M25561 Pain in right knee: Principal | ICD-10-CM

## 2017-01-25 DIAGNOSIS — M25661 Stiffness of right knee, not elsewhere classified: Secondary | ICD-10-CM

## 2017-01-25 DIAGNOSIS — G8929 Other chronic pain: Secondary | ICD-10-CM

## 2017-01-25 NOTE — Therapy (Signed)
[place], [place], [address] Phone: [phone]   Fax:  [phone]  Physical Therapy Treatment  Patient Details  Name: Peter Manning MRN: [id number] Date of Birth: 09/22/1955 Referring Provider: Rodell Perna MD  Encounter Date: 01/25/2017      PT End of Session - 01/25/17 1220    Visit Number 7   Number of Visits 18   Date for PT Re-Evaluation 03/02/17   Authorization Type MCR: Kx mod by 15th visit, Progress note by 10th visit or 8/5   PT Start Time 6979   PT Stop Time 1231   PT Time Calculation (min) 46 min   Activity Tolerance Patient tolerated treatment well   Behavior During Therapy [facility] for tasks assessed/performed      Past Medical History:  Diagnosis Date  . Arthritis   . Bronchitis   . Hepatitis 1976   pt. doesn't remember if A, B, C?  . History of kidney stones   . Hyperlipidemia   . Hypertension   . OSA (obstructive sleep apnea) 12/09/2011   PSG 01/03/12>>AHI 45.7, SpO2 low 73%, PLMI 0.  CPAP 15 cm H2O>>AHI 0, +R.   Marland Kitchen Pneumonia 2015  . Sleep apnea     Past Surgical History:  Procedure Laterality Date  . BACK SURGERY    . JOINT REPLACEMENT    . REVISION TOTAL HIP ARTHROPLASTY Left 11/2015  . TOTAL HIP ARTHROPLASTY    . TOTAL HIP REVISION Left 11/18/2015   Procedure: LEFT TOTAL HIP REVISION;  Surgeon: Marybelle Killings, MD;  Location: [place];  Service: Orthopedics;  Laterality: Left;  . TOTAL KNEE ARTHROPLASTY Right 10/10/2016   Procedure: TOTAL KNEE ARTHROPLASTY;  Surgeon: Marybelle Killings, MD;  Location: [place];  Service: Orthopedics;  Laterality: Right;    There were no vitals filed for this visit.          [facility] PT Assessment - 01/25/17 1203      AROM   Right Knee Extension -17  -14 followig prone hang                     [facility] Adult PT Treatment/Exercise - 01/25/17 1153      Knee/Hip Exercises: Stretches   Active Hamstring Stretch 3 reps;30 seconds  contract/ relax  with 10 sec contraction     Knee/Hip Exercises: Aerobic   Nustep L6 x 6 min LE only     Knee/Hip Exercises: Supine   Straight Leg Raises Strengthening;Right;2 sets;15 reps  with sustained quad set     Knee/Hip Exercises: Prone   Other Prone Exercises prone hang 1 x 5 min with 3# weight   given as HEP     Manual Therapy   Manual therapy comments manual trigger point release over hamstrings   Soft tissue mobilization IASTM over the R distal hamstring and proximal gastroc                PT Education - 01/25/17 1220    Education provided Yes   Education Details reviewed previously provided HEP and updated for prone hanging   Person(s) Educated Patient   Methods Explanation;Verbal cues   Comprehension Verbalized understanding;Verbal cues required          PT Short Term Goals - 01/23/17 1317      PT SHORT TERM GOAL #1   Title pt will be I with inital HEP (02/05/2017)   Baseline independent with exercises so far.  Time 4   Period Weeks   Status On-going     PT SHORT TERM GOAL #2   Title pt will verbalize/ demo techniques to reduce pain and inflamation via RICE and HEP (02/05/2017)   Time 4   Period Weeks   Status Unable to assess     PT SHORT TERM GOAL #3   Title pt will increase R knee extension to </= -18 degrees with </= 6/10 pain to promote functional progression (02/05/2017)   Baseline mpore than -18 degrees   Time 4   Period Weeks   Status On-going     PT SHORT TERM GOAL #4   Title pt will decrease L knee swelling by >/= 2 cm to promote knee mobility and reduce pain (02/05/2017)   Baseline does not measure smaller   Time 4   Period Weeks   Status On-going           PT Long Term Goals - 01/05/17 1132      PT LONG TERM GOAL #1   Title pt to increase R knee extension to </= -5 degrees to promote functional and efficient gait pattern (03/02/2017)   Time 8   Period Weeks   Status New     PT LONG TERM GOAL #2   Title pt to increase R knee/ hip  strength to >/= 4/5 to promote hip/ knee stability with prolonged walking/ standing (03/02/2017)   Time 8   Period Weeks   Status New     PT LONG TERM GOAL #3   Title pt to walk/ standing >/= 45 min and navigate >/= 10 steps reciprocally with LRAD for safety with </= 2/10 pain for functional endurance required for community ambulation and ADLS (07/09/1094)   Time 8   Period Weeks   Status New     PT LONG TERM GOAL #4   Title pt to increase FOTO to </= 38% limited to demo improvement in function (03/02/2017)   Time 8   Period Weeks   Status New     PT LONG TERM GOAL #5   Title pt to be I with all HEP given as of last visit (03/02/2017)   Time 8   Period Weeks   Status New               Plan - 01/25/17 1235    Clinical Impression Statement pt continues to demonstrate difficulty with extension at -17 compared to -18 at inital evaluation. focused on manual to calm down hamstring tightness with stretching and low load long duration pronehangs which he improved to -14 after. discussed importance of stretching consistently to promote extension as well as benefits as it relates to his back.    PT Next Visit Plan continue vaso/edema mgmt. Marland Kitchen assess/ review HEP and update PRN, knee extension mobs, hamstring stretching, manual over hamstrings, hip/knee strength vaso fo pain and edema   PT Home Exercise Plan hamstring stretching (supine/ seated), standing gastroc stretch, quad set with elevation, wall slides ( pre-gait), prone hangs.    Consulted and Agree with Plan of Care Patient      Patient will benefit from skilled therapeutic intervention in order to improve the following deficits and impairments:  Abnormal gait, Pain, Improper body mechanics, Postural dysfunction, Increased edema, Decreased strength, Difficulty walking, Increased fascial restricitons, Decreased endurance, Decreased activity tolerance, Decreased balance  Visit Diagnosis: Chronic pain of right knee  Stiffness of right  knee, not elsewhere classified  Other abnormalities of gait and mobility  Localized edema     Problem List Patient Active Problem List   Diagnosis Date Noted  . S/P total knee arthroplasty, right 12/28/2016  . Right knee DJD 10/10/2016  . 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 OSA (obstructive sleep apnea) 12/09/2011  . Chronic bronchitis ([place]) 12/09/2011  . Tobacco abuse 12/09/2011   Starr Lake PT, DPT, LAT, ATC  01/25/17  12:40 PM      [facility] [address] [place], [place], [address] Phone: [phone]   Fax:  [phone]  Name: Willies Laviolette MRN: [id number] Date of Birth: 20-Mar-1956

## 2017-01-27 ENCOUNTER — Ambulatory Visit: Payer: Medicare Other | Admitting: Physical Therapy

## 2017-01-27 DIAGNOSIS — M25561 Pain in right knee: Secondary | ICD-10-CM | POA: Diagnosis not present

## 2017-01-27 DIAGNOSIS — M25661 Stiffness of right knee, not elsewhere classified: Secondary | ICD-10-CM

## 2017-01-27 DIAGNOSIS — R600 Localized edema: Secondary | ICD-10-CM

## 2017-01-27 DIAGNOSIS — G8929 Other chronic pain: Secondary | ICD-10-CM

## 2017-01-27 DIAGNOSIS — R2689 Other abnormalities of gait and mobility: Secondary | ICD-10-CM

## 2017-01-27 NOTE — Therapy (Signed)
[place], [place], [address] Phone: [phone]   Fax:  [phone]  Physical Therapy Treatment  Patient Details  Name: Peter Manning MRN: [id number] Date of Birth: February 04, 1956 Referring Provider: Rodell Perna MD  Encounter Date: 01/27/2017      PT End of Session - 01/27/17 1151    Visit Number 8   Number of Visits 18   Date for PT Re-Evaluation 03/02/17   Authorization Type MCR: Kx mod by 15th visit, Progress note by 10th visit or 8/5   PT Start Time 6579   PT Stop Time 1240   PT Time Calculation (min) 55 min      Past Medical History:  Diagnosis Date  . Arthritis   . Bronchitis   . Hepatitis 1976   pt. doesn't remember if A, B, C?  . History of kidney stones   . Hyperlipidemia   . Hypertension   . OSA (obstructive sleep apnea) 12/09/2011   PSG 01/03/12>>AHI 45.7, SpO2 low 73%, PLMI 0.  CPAP 15 cm H2O>>AHI 0, +R.   Marland Kitchen Pneumonia 2015  . Sleep apnea     Past Surgical History:  Procedure Laterality Date  . BACK SURGERY    . JOINT REPLACEMENT    . REVISION TOTAL HIP ARTHROPLASTY Left 11/2015  . TOTAL HIP ARTHROPLASTY    . TOTAL HIP REVISION Left 11/18/2015   Procedure: LEFT TOTAL HIP REVISION;  Surgeon: Marybelle Killings, MD;  Location: [place];  Service: Orthopedics;  Laterality: Left;  . TOTAL KNEE ARTHROPLASTY Right 10/10/2016   Procedure: TOTAL KNEE ARTHROPLASTY;  Surgeon: Marybelle Killings, MD;  Location: [place];  Service: Orthopedics;  Laterality: Right;    There were no vitals filed for this visit.      Subjective Assessment - 01/27/17 1149    Subjective The knee has not been hurting. Every once in awhile it can hurt up to 7/10 with prolonged positions.    Currently in Pain? No/denies   Aggravating Factors  prolonged positions   Pain Relieving Factors meds, change positions                          [facility] Adult PT Treatment/Exercise - 01/27/17 0001      Knee/Hip Exercises:  Stretches   Active Hamstring Stretch 3 reps;30 seconds   Gastroc Stretch 3 reps;30 seconds   Gastroc Stretch Limitations slant board     Knee/Hip Exercises: Aerobic   Nustep L6 x 6 min LE only     Knee/Hip Exercises: Machines for Strengthening   Total Gym Leg Press 2 plate bilateral and single 1 plate x 20 each      Knee/Hip Exercises: Standing   Lateral Step Up 15 reps;Hand Hold: 1;Step Height: 6"   Forward Step Up 15 reps;Step Height: 6";Hand Hold: 1   Step Down 10 reps;1 set;Hand Hold: 1;Step Height: 4"   Other Standing Knee Exercises Terminal knee ext ball to wall 10 x 2      Knee/Hip Exercises: Prone   Other Prone Exercises prone hang 2 minutes with 3#, then 3 minutes no weight       Vasopneumatic   Number Minutes Vasopneumatic  10 minutes   Vasopnuematic Location  Knee   Vasopneumatic Pressure Medium   Vasopneumatic Temperature  48     Manual Therapy   Soft tissue mobilization IASTM over the R distal hamstring and proximal gastroc  PT Short Term Goals - 01/23/17 1317      PT SHORT TERM GOAL #1   Title pt will be I with inital HEP (02/05/2017)   Baseline independent with exercises so far.     Time 4   Period Weeks   Status On-going     PT SHORT TERM GOAL #2   Title pt will verbalize/ demo techniques to reduce pain and inflamation via RICE and HEP (02/05/2017)   Time 4   Period Weeks   Status Unable to assess     PT SHORT TERM GOAL #3   Title pt will increase R knee extension to </= -18 degrees with </= 6/10 pain to promote functional progression (02/05/2017)   Baseline mpore than -18 degrees   Time 4   Period Weeks   Status On-going     PT SHORT TERM GOAL #4   Title pt will decrease L knee swelling by >/= 2 cm to promote knee mobility and reduce pain (02/05/2017)   Baseline does not measure smaller   Time 4   Period Weeks   Status On-going           PT Long Term Goals - 01/05/17 1132      PT LONG TERM GOAL #1   Title pt to  increase R knee extension to </= -5 degrees to promote functional and efficient gait pattern (03/02/2017)   Time 8   Period Weeks   Status New     PT LONG TERM GOAL #2   Title pt to increase R knee/ hip strength to >/= 4/5 to promote hip/ knee stability with prolonged walking/ standing (03/02/2017)   Time 8   Period Weeks   Status New     PT LONG TERM GOAL #3   Title pt to walk/ standing >/= 45 min and navigate >/= 10 steps reciprocally with LRAD for safety with </= 2/10 pain for functional endurance required for community ambulation and ADLS (02/09/2118)   Time 8   Period Weeks   Status New     PT LONG TERM GOAL #4   Title pt to increase FOTO to </= 38% limited to demo improvement in function (03/02/2017)   Time 8   Period Weeks   Status New     PT LONG TERM GOAL #5   Title pt to be I with all HEP given as of last visit (03/02/2017)   Time 8   Period Weeks   Status New               Plan - 01/27/17 1231    Clinical Impression Statement No improvement in extension. Pt reports he tried the prone hang at home 2 times, otherwise he does not perform HEP. In prone today he c/o anterior knee pain. Added pillow for comfort. Continued manual IASTM to hamstrings. Increased pain upon standing. VAso for edema and pain at end of treatment. Continued education on imortance of increased HEP frequency to gain extension ROM.    PT Next Visit Plan continue vaso/edema mgmt. Marland Kitchen assess/ review HEP and update PRN, knee extension mobs, hamstring stretching, manual over hamstrings, hip/knee strength vaso fo pain and edema   PT Home Exercise Plan hamstring stretching (supine/ seated), standing gastroc stretch, quad set with elevation, wall slides ( pre-gait), prone hangs.    Consulted and Agree with Plan of Care Patient      Patient will benefit from skilled therapeutic intervention in order to improve the following deficits and impairments:  Abnormal gait, Pain, Improper body mechanics, Postural  dysfunction, Increased edema, Decreased strength, Difficulty walking, Increased fascial restricitons, Decreased endurance, Decreased activity tolerance, Decreased balance  Visit Diagnosis: Chronic pain of right knee  Stiffness of right knee, not elsewhere classified  Other abnormalities of gait and mobility  Localized edema     Problem List Patient Active Problem List   Diagnosis Date Noted  . S/P total knee arthroplasty, right 12/28/2016  . Right knee DJD 10/10/2016  . 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 OSA (obstructive sleep apnea) 12/09/2011  . Chronic bronchitis ([place]) 12/09/2011  . Tobacco abuse 12/09/2011    Dorene Ar, PTA 01/27/2017, 12:34 PM  [facility] [address] [place], [place], [address] Phone: [phone]   Fax:  [phone]  Name: Peter Manning MRN: [id number] Date of Birth: 1956-05-13

## 2017-01-30 ENCOUNTER — Ambulatory Visit: Payer: Medicare Other | Admitting: Physical Therapy

## 2017-01-30 ENCOUNTER — Encounter: Payer: Self-pay | Admitting: Physical Therapy

## 2017-01-30 DIAGNOSIS — R600 Localized edema: Secondary | ICD-10-CM

## 2017-01-30 DIAGNOSIS — R2689 Other abnormalities of gait and mobility: Secondary | ICD-10-CM

## 2017-01-30 DIAGNOSIS — M25561 Pain in right knee: Principal | ICD-10-CM

## 2017-01-30 DIAGNOSIS — M25661 Stiffness of right knee, not elsewhere classified: Secondary | ICD-10-CM

## 2017-01-30 DIAGNOSIS — G8929 Other chronic pain: Secondary | ICD-10-CM

## 2017-01-30 NOTE — Therapy (Signed)
Peter Manning, [place], [address] Phone: [phone]   Fax:  [phone]  Physical Therapy Treatment  Patient Details  Name: Peter Manning MRN: [id number] Date of Birth: 12/30/1955 Referring Provider: Rodell Perna MD  Encounter Date: 01/30/2017    Past Medical History:  Diagnosis Date  . Arthritis   . Bronchitis   . Hepatitis 1976   pt. doesn't remember if A, B, C?  . History of kidney stones   . Hyperlipidemia   . Hypertension   . OSA (obstructive sleep apnea) 12/09/2011   PSG 01/03/12>>AHI 45.7, SpO2 low 73%, PLMI 0.  CPAP 15 cm H2O>>AHI 0, +R.   Marland Kitchen Pneumonia 2015  . Sleep apnea     Past Surgical History:  Procedure Laterality Date  . BACK SURGERY    . JOINT REPLACEMENT    . REVISION TOTAL HIP ARTHROPLASTY Left 11/2015  . TOTAL HIP ARTHROPLASTY    . TOTAL HIP REVISION Left 11/18/2015   Procedure: LEFT TOTAL HIP REVISION;  Surgeon: Marybelle Killings, MD;  Location: [place];  Service: Orthopedics;  Laterality: Left;  . TOTAL KNEE ARTHROPLASTY Right 10/10/2016   Procedure: TOTAL KNEE ARTHROPLASTY;  Surgeon: Marybelle Killings, MD;  Location: [place];  Service: Orthopedics;  Laterality: Right;    There were no vitals filed for this visit.      Subjective Assessment - 01/30/17 1158    Subjective 5/10 pain knee.  Sleeping is going OK.   Currently in Pain? Yes   Pain Score 5    Pain Location Knee   Pain Orientation Right   Pain Descriptors / Indicators Aching   Pain Frequency Constant   Aggravating Factors  rain? sitting too long.   Multiple Pain Sites --  Mod back,  LT hip 7/10                         [facility] Adult PT Treatment/Exercise - 01/30/17 0001      Knee/Hip Exercises: Aerobic   Nustep L6 x 6 min LE only     Knee/Hip Exercises: Machines for Strengthening   Total Gym Leg Press 2 plate bilateral and single 1 plate x 20 each      Knee/Hip Exercises: Standing   Terminal Knee Extension  Limitations 10 x pressing ball into wall.   Lateral Step Up 15 reps;Hand Hold: 1;Step Height: 6"   Forward Step Up 15 reps;Hand Hold: 0;Step Height: 6"   Step Down 10 reps;1 set;Hand Hold: 1;Step Height: 4"  difficult   Wall Squat 10 reps   Wall Squat Limitations cues     Modalities   Modalities Vasopneumatic     Vasopneumatic   Number Minutes Vasopneumatic  15 minutes   Vasopnuematic Location  Knee   Vasopneumatic Pressure Low   Vasopneumatic Temperature  48     Manual Therapy   Manual Therapy Edema management   Manual therapy comments Taping for edema  and to create space lateral distal knee   Edema Management retrograde  roller used,   manual   Soft tissue mobilization distal hamstrings, anterior thigh   Passive ROM Extrension                  PT Short Term Goals - 01/30/17 1259      PT SHORT TERM GOAL #1   Title pt will be I with inital HEP (02/05/2017)   Baseline independent with exercises so far.  Time 4   Period Weeks   Status On-going     PT SHORT TERM GOAL #2   Title pt will verbalize/ demo techniques to reduce pain and inflamation via RICE and HEP (02/05/2017)   Baseline Education in this continued.  Not doing at home. ( resting only.)   Time 4   Period Weeks   Status On-going     PT SHORT TERM GOAL #3   Title pt will increase R knee extension to </= -18 degrees with </= 6/10 pain to promote functional progression (02/05/2017)   Baseline more than -18 degrees   Time 4   Period Weeks   Status On-going           PT Long Term Goals - 01/05/17 1132      PT LONG TERM GOAL #1   Title pt to increase R knee extension to </= -5 degrees to promote functional and efficient gait pattern (03/02/2017)   Time 8   Period Weeks   Status New     PT LONG TERM GOAL #2   Title pt to increase R knee/ hip strength to >/= 4/5 to promote hip/ knee stability with prolonged walking/ standing (03/02/2017)   Time 8   Period Weeks   Status New     PT LONG TERM  GOAL #3   Title pt to walk/ standing >/= 45 min and navigate >/= 10 steps reciprocally with LRAD for safety with </= 2/10 pain for functional endurance required for community ambulation and ADLS (02/02/5002)   Time 8   Period Weeks   Status New     PT LONG TERM GOAL #4   Title pt to increase FOTO to </= 38% limited to demo improvement in function (03/02/2017)   Time 8   Period Weeks   Status New     PT LONG TERM GOAL #5   Title pt to be I with all HEP given as of last visit (03/02/2017)   Time 8   Period Weeks   Status New             Patient will benefit from skilled therapeutic intervention in order to improve the following deficits and impairments:     Visit Diagnosis: Chronic pain of right knee  Stiffness of right knee, not elsewhere classified  Other abnormalities of gait and mobility  Localized edema     Problem List Patient Active Problem List   Diagnosis Date Noted  . S/P total knee arthroplasty, right 12/28/2016  . Right knee DJD 10/10/2016  . 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 OSA (obstructive sleep apnea) 12/09/2011  . Chronic bronchitis ([place]) 12/09/2011  . Tobacco abuse 12/09/2011    HARRIS,KAREN PTA 01/30/2017, 1:02 PM  [facility] [address] [place], [place], [address] Phone: [phone]   Fax:  [phone]  Name: Peter Manning MRN: [id number] Date of Birth: 1956-04-07

## 2017-01-31 ENCOUNTER — Ambulatory Visit (INDEPENDENT_AMBULATORY_CARE_PROVIDER_SITE_OTHER): Payer: Medicare Other | Admitting: Orthopaedic Surgery

## 2017-02-01 ENCOUNTER — Ambulatory Visit: Payer: Medicare Other | Attending: Orthopaedic Surgery | Admitting: Physical Therapy

## 2017-02-01 DIAGNOSIS — M25661 Stiffness of right knee, not elsewhere classified: Secondary | ICD-10-CM

## 2017-02-01 DIAGNOSIS — M25561 Pain in right knee: Secondary | ICD-10-CM | POA: Insufficient documentation

## 2017-02-01 DIAGNOSIS — R600 Localized edema: Secondary | ICD-10-CM | POA: Diagnosis present

## 2017-02-01 DIAGNOSIS — R2689 Other abnormalities of gait and mobility: Secondary | ICD-10-CM

## 2017-02-01 DIAGNOSIS — G8929 Other chronic pain: Secondary | ICD-10-CM | POA: Diagnosis present

## 2017-02-01 NOTE — Therapy (Addendum)
[place], [place], [address] Phone: [phone]   Fax:  [phone]  Physical Therapy Treatment / Discharge Summary  Patient Details  Name: Peter Manning MRN: [id number] Date of Birth: 19-Jul-1955 Referring Provider: Rodell Perna MD  Encounter Date: 02/01/2017      PT End of Session - 02/01/17 1152    Visit Number 10   Number of Visits 18   Date for PT Re-Evaluation 03/02/17   Authorization Type MCR: Kx mod by 15th visit, Progress note by 10th visit or 8/5   PT Start Time 1147   PT Stop Time 1227   PT Time Calculation (min) 40 min      Past Medical History:  Diagnosis Date  . Arthritis   . Bronchitis   . Hepatitis 1976   pt. doesn't remember if A, B, C?  . History of kidney stones   . Hyperlipidemia   . Hypertension   . OSA (obstructive sleep apnea) 12/09/2011   PSG 01/03/12>>AHI 45.7, SpO2 low 73%, PLMI 0.  CPAP 15 cm H2O>>AHI 0, +R.   Marland Kitchen Pneumonia 2015  . Sleep apnea     Past Surgical History:  Procedure Laterality Date  . BACK SURGERY    . JOINT REPLACEMENT    . REVISION TOTAL HIP ARTHROPLASTY Left 11/2015  . TOTAL HIP ARTHROPLASTY    . TOTAL HIP REVISION Left 11/18/2015   Procedure: LEFT TOTAL HIP REVISION;  Surgeon: Marybelle Killings, MD;  Location: [facility];  Service: Orthopedics;  Laterality: Left;  . TOTAL KNEE ARTHROPLASTY Right 10/10/2016   Procedure: TOTAL KNEE ARTHROPLASTY;  Surgeon: Marybelle Killings, MD;  Location: [facility];  Service: Orthopedics;  Laterality: Right;    There were no vitals filed for this visit.      Subjective Assessment - 02/01/17 1152    Subjective I've been walking    Currently in Pain? Yes   Pain Score 5    Pain Location Knee   Pain Orientation Right   Pain Descriptors / Indicators Aching   Aggravating Factors  extension stretching    Pain Relieving Factors meds, change positions            [facility] PT Assessment - 02/01/17 0001      Observation/Other Assessments    Focus on Therapeutic Outcomes (FOTO)  39% limited     Circumferential Edema   Circumferential - Right 42cm, left 41 cm     AROM   Right Knee Extension -18   Right Knee Flexion 118                     [facility] Adult PT Treatment/Exercise - 02/01/17 0001      Knee/Hip Exercises: Stretches   Active Hamstring Stretch 3 reps;30 seconds   Active Hamstring Stretch Limitations strap   Gastroc Stretch 3 reps;30 seconds   Gastroc Stretch Limitations slant boardand prone with strap      Knee/Hip Exercises: Aerobic   Nustep L6 x 6 min LE only     Knee/Hip Exercises: Machines for Strengthening   Total Gym Leg Press 2 plate bilateral and single 1 plate x 20 each      Knee/Hip Exercises: Standing   Lateral Step Up 15 reps;Hand Hold: 1;Step Height: 6"   Forward Step Up 15 reps;Hand Hold: 0;Step Height: 6"   Step Down 10 reps;1 set;Hand Hold: 1;Step Height: 4"  difficult   Wall Squat 10 reps   SLS 14 seconds  Gait Training up/ down 12 stairs reciprocal with 1 HR    Other Standing Knee Exercises Terminal knee ext ball to wall 10 x 2    Other Standing Knee Exercises tandem stance and gait in parallel bars      Knee/Hip Exercises: Supine   Terminal Knee Extension 10 reps   Straight Leg Raises Strengthening;Right;2 sets;15 reps  with sustained quad set     Knee/Hip Exercises: Prone   Other Prone Exercises prone hang no wt                  PT Short Term Goals - 02-12-17 1201      PT SHORT TERM GOAL #1   Title pt will be I with inital HEP (02/05/2017)   Baseline does not perform HEP   Time 4   Period Weeks   Status On-going     PT SHORT TERM GOAL #2   Title pt will verbalize/ demo techniques to reduce pain and inflamation via RICE and HEP (02/05/2017)   Baseline does not ice    Time 4   Period Weeks   Status On-going     PT SHORT TERM GOAL #3   Title pt will increase R knee extension to </= -18 degrees with </= 6/10 pain to promote functional progression  (02/05/2017)   Baseline averages 17-20 degrees lacking    Time 4   Period Weeks   Status On-going     PT SHORT TERM GOAL #4   Title pt will decrease L knee swelling by >/= 2 cm to promote knee mobility and reduce pain (02/05/2017)   Time 4   Period Weeks   Status On-going           PT Long Term Goals - 02/12/17 1203      PT LONG TERM GOAL #1   Title pt to increase R knee extension to </= -5 degrees to promote functional and efficient gait pattern (03/02/2017)   Baseline -17 best    Time 8   Period Weeks   Status On-going     PT LONG TERM GOAL #2   Title pt to increase R knee/ hip strength to >/= 4/5 to promote hip/ knee stability with prolonged walking/ standing (03/02/2017)   Baseline walking up to 2 miles    Time 8   Period Weeks   Status Unable to assess     PT LONG TERM GOAL #3   Title pt to walk/ standing >/= 45 min and navigate >/= 10 steps reciprocally with LRAD for safety with </= 2/10 pain for functional endurance required for community ambulation and ADLS (07/09/1094)   Baseline pain average 5/10, able to walk up to 2 miles, navigate stairs    Time 8   Period Weeks   Status Partially Met     PT LONG TERM GOAL #4   Title pt to increase FOTO to </= 38% limited to demo improvement in function (03/02/2017)   Baseline 39%   Time 8   Period Weeks   Status On-going     PT LONG TERM GOAL #5   Title pt to be I with all HEP given as of last visit (03/02/2017)   Baseline does not perform   Time 8   Period Weeks   Status On-going             G-Codes - 2017-02-12 1325    Functional Assessment Tool Used (Outpatient Only) ROM/ FOTO   Functional Limitation Mobility: Walking and  moving around   Mobility: Walking and Moving Around Current Status [phone]) At least 60 percent but less than 80 percent impaired, limited or restricted   Mobility: Walking and Moving Around Goal Status [phone]) At least 20 percent but less than 40 percent impaired, limited or restricted              Plan - 02/01/17 1154    Clinical Impression Statement Pt reports walking up to two miles everyother day or so. He has not performed any HEP except prone hang 1 time over the last few days. He continues to lack 18 degrees of knee extension. he can naviagate reciprocal stairs safely with 1 handrail. Partially met LTG#3.    PT Next Visit Plan CHECK HIP STRENGTH , continue vaso/edema mgmt. Marland Kitchen assess/ review HEP and update PRN, knee extension mobs, hamstring stretching, manual over hamstrings, hip/knee strength vaso fo pain and edema   PT Home Exercise Plan hamstring stretching (supine/ seated), standing gastroc stretch, quad set with elevation, wall slides ( pre-gait), prone hangs.    Consulted and Agree with Plan of Care Patient      Patient will benefit from skilled therapeutic intervention in order to improve the following deficits and impairments:  Abnormal gait, Pain, Improper body mechanics, Postural dysfunction, Increased edema, Decreased strength, Difficulty walking, Increased fascial restricitons, Decreased endurance, Decreased activity tolerance, Decreased balance  Visit Diagnosis: Chronic pain of right knee  Stiffness of right knee, not elsewhere classified  Other abnormalities of gait and mobility  Localized edema     Problem List Patient Active Problem List   Diagnosis Date Noted  . S/P total knee arthroplasty, right 12/28/2016  . Right knee DJD 10/10/2016  . 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 OSA (obstructive sleep apnea) 12/09/2011  . Chronic bronchitis ([place]) 12/09/2011  . Tobacco abuse 12/09/2011    Dorene Ar, PTA 02/01/2017, 1:05 PM  [facility] [address] [place], [place], [address] Phone: [phone]   Fax:  [phone]  Name: Peter Manning MRN: [id number] Date of Birth: 20-Mar-1956     Starr Lake PT, DPT, LAT, ATC  02/01/17  1:26 PM      PHYSICAL THERAPY  DISCHARGE SUMMARY  Visits from Start of Care: 10  Current functional level related to goals / functional outcomes: See goals   Remaining deficits: unknown   Education / Equipment: HEP, theraband,   Plan: Patient agrees to discharge.  Patient goals were partially met. Patient is being discharged due to not returning since the last visit.  ?????      Kristoffer Leamon PT, DPT, LAT, ATC  03/09/17  9:09 AM

## 2017-02-02 ENCOUNTER — Encounter: Payer: Self-pay | Admitting: Podiatry

## 2017-02-02 ENCOUNTER — Ambulatory Visit (INDEPENDENT_AMBULATORY_CARE_PROVIDER_SITE_OTHER): Payer: Medicare Other | Admitting: Podiatry

## 2017-02-02 VITALS — BP 133/70

## 2017-02-02 DIAGNOSIS — L600 Ingrowing nail: Secondary | ICD-10-CM | POA: Diagnosis not present

## 2017-02-02 NOTE — Patient Instructions (Signed)

## 2017-02-02 NOTE — Progress Notes (Signed)
   Subjective:    Patient ID: Peter Manning, male    DOB: 17-Nov-1955, 61 y.o.   MRN: [id number]  HPI  Chief Complaint  Patient presents with  . Ingrown Toenail    Lt foot great toenail is thick/pain..Pt stated want the toenail taking off.     Review of Systems  Skin: Positive for color change.  All other systems reviewed and are negative.      Objective:   Physical Exam        Assessment & Plan:

## 2017-02-03 ENCOUNTER — Ambulatory Visit: Payer: Medicare Other | Admitting: Physical Therapy

## 2017-02-03 NOTE — Progress Notes (Signed)
Subjective:    Patient ID: Peter Manning, male   DOB: 61 y.o.   MRN: [id number]   HPI patient presents with damaged left hallux nail and also second nail. Patient had previous nail done on the right one a number of years ago and states that the nails are very tender    Review of Systems  All other systems reviewed and are negative.       Objective:  Physical Exam  Cardiovascular: Intact distal pulses.   Musculoskeletal: Normal range of motion.  Neurological: He is alert.  Skin: Skin is warm and dry.  Nursing note and vitals reviewed.  neurovascular status intact muscle strength was adequate range of motion within normal limits with patient found to have severely thickened hallux nail left second nail left that's dystrophic and painful when pressed from a dorsal direction. Patient has slight distal redness but no drainage noted and was found have good digital perfusion and is well oriented 3     Assessment:   Damaged hallux and second nails left with pain from a palpation dorsal direction with chronic changes noted      Plan:    H&P condition reviewed and discussed. Patient wants nail removal and I explained procedures and risk and today I went ahead and I infiltrated the left hallux and second toe with 120 mg like Marcaine mixture did sterile prep of the digits and then with sterile instrumentation remove the hallux second nails exposed matrix and applied phenol 5 applications 30 seconds to the hallux for applications 30 seconds to the second digit followed by alcohol lavage and sterile dressing. Gave instructions on soaks and reappoint

## 2017-02-06 ENCOUNTER — Encounter: Payer: Self-pay | Admitting: Podiatry

## 2017-02-06 ENCOUNTER — Ambulatory Visit (INDEPENDENT_AMBULATORY_CARE_PROVIDER_SITE_OTHER): Payer: Self-pay | Admitting: Podiatry

## 2017-02-06 DIAGNOSIS — L03032 Cellulitis of left toe: Secondary | ICD-10-CM

## 2017-02-06 MED ORDER — CEPHALEXIN 500 MG PO CAPS: 500.0000 mg | ORAL_CAPSULE | Freq: Three times a day (TID) | ORAL | 1 refills | Status: DC

## 2017-02-06 NOTE — Progress Notes (Signed)
Subjective:    Patient ID: Peter Manning, male   DOB: 61 y.o.   MRN: [id number]   HPI patient presents stating that he was concerned about his left hallux nail because is been some redness and drainage and it's been mildly painful    ROS      Objective:  Physical Exam ingrown removal occurred left hallux with localized paronychia infection     Assessment:    Localized paronychia of the left hallux after having nail removal     Plan:    Advised on what to do if any proximal edema erythema or drainage were to occur and at this point placed patient on antibiotic consisting of cephalexin 500 mg 3 times a day and gave instructions on soaks and reappoint to recheck

## 2017-02-18 ENCOUNTER — Encounter (HOSPITAL_COMMUNITY): Payer: Self-pay | Admitting: *Deleted

## 2017-02-18 ENCOUNTER — Ambulatory Visit (HOSPITAL_COMMUNITY)
Admission: EM | Admit: 2017-02-18 | Discharge: 2017-02-18 | Disposition: A | Discharge status: Home / Self Care | Payer: Medicare Other | Attending: Emergency Medicine | Admitting: Emergency Medicine

## 2017-02-18 DIAGNOSIS — R310 Gross hematuria: Secondary | ICD-10-CM

## 2017-02-18 NOTE — ED Triage Notes (Signed)
Patient reports blood in urine since last night. Patient reports intermittent abdominal pain and back pain. States history of same and went to ED but unsure of diagnosis. No dysuria.

## 2017-02-18 NOTE — ED Provider Notes (Signed)
[facility]    CSN: [id number] Arrival date & time: 02/18/17  1202     History   Chief Complaint Chief Complaint  Patient presents with  . Hematuria    HPI Peter Manning is a 61 y.o. male.   61 year old male presents to the urgent care with complaint of gross hematuria that started last night. It continued through this morning. He denies associated pain or other symptoms. No fever or chills. Denies urinary symptoms such as urinary frequency, dysuria, urgency or interrupted flow. Denies nocturia or incontinence. No abdominal pain or other GI symptoms. States he was evaluated in the emergency department 4-5 months ago for the same thing he was told he might have a kidney stone and was given an unknown type medication and was doing well up until yesterday.      Past Medical History:  Diagnosis Date  . Arthritis   . Bronchitis   . Hepatitis 1976   pt. doesn't remember if A, B, C?  . History of kidney stones   . Hyperlipidemia   . Hypertension   . OSA (obstructive sleep apnea) 12/09/2011   PSG 01/03/12>>AHI 45.7, SpO2 low 73%, PLMI 0.  CPAP 15 cm H2O>>AHI 0, +R.   Marland Kitchen Pneumonia 2015  . Sleep apnea     Patient Active Problem List   Diagnosis Date Noted  . S/P total knee arthroplasty, right 12/28/2016  . Right knee DJD 10/10/2016  . 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 OSA (obstructive sleep apnea) 12/09/2011  . Chronic bronchitis ([place]) 12/09/2011  . Tobacco abuse 12/09/2011    Past Surgical History:  Procedure Laterality Date  . BACK SURGERY    . JOINT REPLACEMENT    . REVISION TOTAL HIP ARTHROPLASTY Left 11/2015  . TOTAL HIP ARTHROPLASTY    . TOTAL HIP REVISION Left 11/18/2015   Procedure: LEFT TOTAL HIP REVISION;  Surgeon: Marybelle Killings, MD;  Location: [facility];  Service: Orthopedics;  Laterality: Left;  . TOTAL KNEE ARTHROPLASTY Right 10/10/2016   Procedure: TOTAL KNEE ARTHROPLASTY;  Surgeon: Marybelle Killings, MD;  Location: [facility];  Service: Orthopedics;   Laterality: Right;       Home Medications    Prior to Admission medications   Medication Sig Start Date End Date Taking? Authorizing Provider  amLODipine (NORVASC) 10 MG tablet Take 10 mg by mouth daily.  08/24/16  Yes [provider]  cephALEXin (KEFLEX) 500 MG capsule Take 1 capsule (500 mg total) by mouth 3 (three) times daily. 02/06/17  Yes Wallene Huh, DPM  oxyCODONE-acetaminophen (PERCOCET) 10-325 MG tablet Take 1 tablet by mouth every 6 (six) hours as needed for pain.  10/25/16  Yes [provider]  albuterol (PROVENTIL HFA;VENTOLIN HFA) 108 (90 BASE) MCG/ACT inhaler Inhale 1-2 puffs into the lungs every 6 (six) hours as needed for wheezing or shortness of breath. 04/17/14   Hyman Bible, PA-C  aspirin EC 325 MG EC tablet Take 1 tablet (325 mg total) by mouth daily with breakfast. 10/12/16   Lanae Crumbly, PA-C  benzonatate (TESSALON) 100 MG capsule Take 2 capsules (200 mg total) by mouth 2 (two) times daily as needed for cough. 12/31/16   Nona Dell, PA-C  fluticasone (FLONASE) 50 MCG/ACT nasal spray Place 1 spray into both nostrils daily. 12/31/16   Nona Dell, PA-C  ibuprofen (ADVIL,MOTRIN) 600 MG tablet Take 1 tablet (600 mg total) by mouth every 6 (six) hours as needed. 12/31/16   Nona Dell,  PA-C  methocarbamol (ROBAXIN) 500 MG tablet Take 1 tablet (500 mg total) by mouth every 6 (six) hours as needed for muscle spasms. 10/12/16   Lanae Crumbly, PA-C  morphine (MS CONTIN) 15 MG 12 hr tablet  12/23/16   [provider]  oxyCODONE-acetaminophen (PERCOCET/ROXICET) 5-325 MG tablet Take 2 tablets by mouth every 4 (four) hours as needed. 11/02/16   Tanna Furry, MD  polyethylene glycol [facility] / Floria Raveling) packet Take 17 g by mouth daily as needed for mild constipation. 10/12/16   Lanae Crumbly, PA-C  potassium chloride SA (K-DUR,KLOR-CON) 20 MEQ tablet Take 1 tablet (20 mEq total) by mouth daily. 12/04/16   Julianne Rice, MD  XTAMPZA ER 9 MG C12A  01/24/17   [provider]    Family History Family History  Problem Relation Age of Onset  . Heart attack Father   . Cancer Mother     Social History Social History  Substance Use Topics  . Smoking status: Current Some Day Smoker    Packs/day: 0.50    Years: 39.00    Types: Cigarettes  . Smokeless tobacco: Never Used     Comment: states on and off smoker  . Alcohol use No     Allergies   Patient has no known allergies.   Review of Systems Review of Systems  Constitutional: Negative.   HENT: Negative.   Respiratory: Negative.   Gastrointestinal: Negative.   Genitourinary: Positive for hematuria. Negative for discharge, dysuria, flank pain, frequency, genital sores, penile swelling, scrotal swelling, testicular pain and urgency.  Neurological: Negative.   All other systems reviewed and are negative.    Physical Exam Triage Vital Signs ED Triage Vitals [02/18/17 1223]  Enc Vitals Group     BP 135/68     Pulse Rate 70     Resp 16     Temp 97.8 F (36.6 C)     Temp Source Oral     SpO2 100 %     Weight      Height      Head Circumference      Peak Flow      Pain Score      Pain Loc      Pain Edu?      Excl. in [place]?    No data found.   Updated Vital Signs BP 135/68 (BP Location: Right Arm)   Pulse 70   Temp 97.8 F (36.6 C) (Oral)   Resp 16   SpO2 100%   Visual Acuity Right Eye Distance:   Left Eye Distance:   Bilateral Distance:    Right Eye Near:   Left Eye Near:    Bilateral Near:     Physical Exam  Constitutional: He is oriented to person, place, and time. He appears well-developed and well-nourished. No distress.  Neck: Neck supple.  Cardiovascular: Normal rate.   Pulmonary/Chest: Effort normal.  Abdominal: Soft. Bowel sounds are normal. He exhibits no distension and no mass. There is no tenderness. There is no rebound and no guarding.  Musculoskeletal: Normal range of motion. He exhibits no  edema or deformity.  Neurological: He is alert and oriented to person, place, and time.  Skin: Skin is warm and dry.  Psychiatric: He has a normal mood and affect.  Nursing note and vitals reviewed.    UC Treatments / Results  Labs (all labs ordered are listed, but only abnormal results are displayed) Labs Reviewed - No data to display  EKG  EKG Interpretation None       Radiology No results found.  Procedures Procedures (including critical care time)  Medications Ordered in UC Medications - No data to display   Initial Impression / Assessment and Plan / UC Course  I have reviewed the triage vital signs and the nursing notes.  Pertinent labs & imaging results that were available during my care of the patient were reviewed by me and considered in my medical decision making (see chart for details).     The source of the blood in your urine is uncertain. In order to diagnose this problem you will have to have additional tests not available at the urgent care. You have the option to go to the emergency department for possible consideration of CT scan or few develop pain, nausea, vomiting, fever or chills, pain with urination or other symptoms recommend that she did go to emergency department. If you continue to be stable with just blood in the urine over the next day or to be sure to call the urologist on Monday for an appointment as soon as possible.  Final Clinical Impressions(s) / UC Diagnoses   Final diagnoses:  Gross hematuria  Painless gross hematuria.  New Prescriptions New Prescriptions   No medications on file     Controlled Substance Prescriptions [place] Controlled Substance Registry consulted? yes   Janne Napoleon, NP 02/18/17 1305

## 2017-02-18 NOTE — Discharge Instructions (Signed)
The source of the blood in your urine is uncertain. In order to diagnose this problem you will have to have additional tests not available at the urgent care. You have the option to go to the emergency department for possible consideration of CT scan or few develop pain, nausea, vomiting, fever or chills, pain with urination or other symptoms recommend that she did go to emergency department. If you continue to be stable with just blood in the urine over the next day or to be sure to call the urologist on Monday for an appointment as soon as possible.

## 2017-02-22 ENCOUNTER — Ambulatory Visit (INDEPENDENT_AMBULATORY_CARE_PROVIDER_SITE_OTHER): Payer: Medicare Other | Admitting: Orthopaedic Surgery

## 2017-03-02 ENCOUNTER — Emergency Department (HOSPITAL_COMMUNITY)
Admission: EM | Admit: 2017-03-02 | Discharge: 2017-03-03 | Disposition: A | Discharge status: Home / Self Care | Payer: Medicare Other | Attending: Emergency Medicine | Admitting: Emergency Medicine

## 2017-03-02 ENCOUNTER — Encounter (HOSPITAL_COMMUNITY): Payer: Self-pay | Admitting: Emergency Medicine

## 2017-03-02 DIAGNOSIS — Z96651 Presence of right artificial knee joint: Secondary | ICD-10-CM | POA: Insufficient documentation

## 2017-03-02 DIAGNOSIS — Z7982 Long term (current) use of aspirin: Secondary | ICD-10-CM | POA: Diagnosis not present

## 2017-03-02 DIAGNOSIS — L03032 Cellulitis of left toe: Secondary | ICD-10-CM | POA: Diagnosis not present

## 2017-03-02 DIAGNOSIS — Z96641 Presence of right artificial hip joint: Secondary | ICD-10-CM | POA: Insufficient documentation

## 2017-03-02 DIAGNOSIS — I10 Essential (primary) hypertension: Secondary | ICD-10-CM | POA: Diagnosis not present

## 2017-03-02 DIAGNOSIS — F17210 Nicotine dependence, cigarettes, uncomplicated: Secondary | ICD-10-CM | POA: Diagnosis not present

## 2017-03-02 DIAGNOSIS — Z79899 Other long term (current) drug therapy: Secondary | ICD-10-CM | POA: Diagnosis not present

## 2017-03-02 DIAGNOSIS — L089 Local infection of the skin and subcutaneous tissue, unspecified: Secondary | ICD-10-CM

## 2017-03-02 DIAGNOSIS — M79675 Pain in left toe(s): Secondary | ICD-10-CM | POA: Diagnosis present

## 2017-03-02 NOTE — ED Triage Notes (Signed)
Pt reports he had toenails removed from L big toe and L second toe approx 6 weeks ago. Pt has had pain ever since with some drainage. Mild swelling noted.

## 2017-03-03 ENCOUNTER — Emergency Department (HOSPITAL_COMMUNITY): Payer: Medicare Other

## 2017-03-03 DIAGNOSIS — L03032 Cellulitis of left toe: Secondary | ICD-10-CM | POA: Diagnosis not present

## 2017-03-03 MED ORDER — NAPROXEN 375 MG PO TABS: 375.0000 mg | ORAL_TABLET | Freq: Two times a day (BID) | ORAL | 0 refills | Status: DC

## 2017-03-03 MED ORDER — AMOXICILLIN-POT CLAVULANATE 875-125 MG PO TABS: 1.0000 | ORAL_TABLET | Freq: Two times a day (BID) | ORAL | 0 refills | Status: DC

## 2017-03-03 NOTE — ED Notes (Signed)
EDP to see and assess patient prior to RN assessment. See EDP note.

## 2017-03-03 NOTE — ED Notes (Signed)
Patient verbalized understanding of discharge instructions and denies any further needs or questions at this time. VS stable. Patient ambulatory with steady gait. Escorted to ED entrance.  

## 2017-03-03 NOTE — ED Provider Notes (Signed)
[facility] DEPT Provider Note   CSN: [id number] Arrival date & time: 03/02/17  2131     History   Chief Complaint Chief Complaint  Patient presents with  . Toe Pain    HPI Peter Manning is a 61 y.o. male.  Patient had toenail removal performed by podiatrist about 6 weeks ago. Patient reporting pain of left great toe and purulent drainage.   The history is provided by the patient and medical records. No language interpreter was used.  Toe Pain  This is a recurrent problem. The current episode started more than 1 week ago. The problem occurs daily. The problem has been gradually worsening.    Past Medical History:  Diagnosis Date  . Arthritis   . Bronchitis   . Hepatitis 1976   pt. doesn't remember if A, B, C?  . History of kidney stones   . Hyperlipidemia   . Hypertension   . OSA (obstructive sleep apnea) 12/09/2011   PSG 01/03/12>>AHI 45.7, SpO2 low 73%, PLMI 0.  CPAP 15 cm H2O>>AHI 0, +R.   Marland Kitchen Pneumonia 2015  . Sleep apnea     Patient Active Problem List   Diagnosis Date Noted  . S/P total knee arthroplasty, right 12/28/2016  . Right knee DJD 10/10/2016  . 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 OSA (obstructive sleep apnea) 12/09/2011  . Chronic bronchitis ([place]) 12/09/2011  . Tobacco abuse 12/09/2011    Past Surgical History:  Procedure Laterality Date  . BACK SURGERY    . JOINT REPLACEMENT    . REVISION TOTAL HIP ARTHROPLASTY Left 11/2015  . TOTAL HIP ARTHROPLASTY    . TOTAL HIP REVISION Left 11/18/2015   Procedure: LEFT TOTAL HIP REVISION;  Surgeon: Marybelle Killings, MD;  Location: [facility];  Service: Orthopedics;  Laterality: Left;  . TOTAL KNEE ARTHROPLASTY Right 10/10/2016   Procedure: TOTAL KNEE ARTHROPLASTY;  Surgeon: Marybelle Killings, MD;  Location: [facility];  Service: Orthopedics;  Laterality: Right;       Home Medications    Prior to Admission medications   Medication Sig Start Date End Date Taking? Authorizing Provider  albuterol (PROVENTIL  HFA;VENTOLIN HFA) 108 (90 BASE) MCG/ACT inhaler Inhale 1-2 puffs into the lungs every 6 (six) hours as needed for wheezing or shortness of breath. 04/17/14   Hyman Bible, PA-C  amLODipine (NORVASC) 10 MG tablet Take 10 mg by mouth daily.  08/24/16   [provider]  aspirin EC 325 MG EC tablet Take 1 tablet (325 mg total) by mouth daily with breakfast. 10/12/16   Lanae Crumbly, PA-C  benzonatate (TESSALON) 100 MG capsule Take 2 capsules (200 mg total) by mouth 2 (two) times daily as needed for cough. 12/31/16   Nona Dell, PA-C  cephALEXin (KEFLEX) 500 MG capsule Take 1 capsule (500 mg total) by mouth 3 (three) times daily. 02/06/17   Wallene Huh, DPM  fluticasone (FLONASE) 50 MCG/ACT nasal spray Place 1 spray into both nostrils daily. 12/31/16   Nona Dell, PA-C  ibuprofen (ADVIL,MOTRIN) 600 MG tablet Take 1 tablet (600 mg total) by mouth every 6 (six) hours as needed. 12/31/16   Nona Dell, PA-C  methocarbamol (ROBAXIN) 500 MG tablet Take 1 tablet (500 mg total) by mouth every 6 (six) hours as needed for muscle spasms. 10/12/16   Lanae Crumbly, PA-C  morphine (MS CONTIN) 15 MG 12 hr tablet  12/23/16   [provider]  oxyCODONE-acetaminophen (PERCOCET) 10-325 MG tablet Take 1  tablet by mouth every 6 (six) hours as needed for pain.  10/25/16   [provider]  oxyCODONE-acetaminophen (PERCOCET/ROXICET) 5-325 MG tablet Take 2 tablets by mouth every 4 (four) hours as needed. 11/02/16   Tanna Furry, MD  polyethylene glycol [facility] / Floria Raveling) packet Take 17 g by mouth daily as needed for mild constipation. 10/12/16   Lanae Crumbly, PA-C  potassium chloride SA (K-DUR,KLOR-CON) 20 MEQ tablet Take 1 tablet (20 mEq total) by mouth daily. 12/04/16   Julianne Rice, MD  XTAMPZA ER 9 MG C12A  01/24/17   [provider]    Family History Family History  Problem Relation Age of Onset  . Heart attack Father   . Cancer Mother      Social History Social History  Substance Use Topics  . Smoking status: Current Some Day Smoker    Packs/day: 0.50    Years: 39.00    Types: Cigarettes  . Smokeless tobacco: Never Used     Comment: states on and off smoker  . Alcohol use No     Allergies   Patient has no known allergies.   Review of Systems Review of Systems  All other systems reviewed and are negative.    Physical Exam Updated Vital Signs BP (!) 145/98 (BP Location: Right Arm)   Pulse 67   Temp 98.8 F (37.1 C) (Oral)   Resp 18   Ht 5\' 8"  (1.727 m)   Wt 113.4 kg (250 lb)   SpO2 97%   BMI 38.01 kg/m   Physical Exam  Constitutional: He is oriented to person, place, and time. He appears well-developed and well-nourished.  HENT:  Head: Normocephalic.  Eyes: Conjunctivae are normal.  Neck: Neck supple.  Cardiovascular: Normal rate and regular rhythm.   Pulmonary/Chest: Effort normal and breath sounds normal.  Abdominal: Soft. Bowel sounds are normal.  Musculoskeletal: He exhibits tenderness.       Feet:  Neurological: He is alert and oriented to person, place, and time.  Skin: Skin is warm and dry.  Psychiatric: He has a normal mood and affect.  Nursing note and vitals reviewed.    ED Treatments / Results  Labs (all labs ordered are listed, but only abnormal results are displayed) Labs Reviewed - No data to display  EKG  EKG Interpretation None       Radiology Dg Toe Great Left  Result Date: 03/03/2017 CLINICAL DATA:  Left great toe pain for 2 days. EXAM: LEFT GREAT TOE COMPARISON:  None. FINDINGS: Negative for fracture or dislocation. No radiopaque foreign body. No bone lesion or bony destruction. IMPRESSION: Negative. Electronically Signed   By: Andreas Newport M.D.   On: 03/03/2017 00:56    Procedures Procedures (including critical care time)  Medications Ordered in ED Medications - No data to display   Initial Impression / Assessment and Plan / ED Course  I have  reviewed the triage vital signs and the nursing notes.  Pertinent labs & imaging results that were available during my care of the patient were reviewed by me and considered in my medical decision making (see chart for details).     Patient presentation consistent with toe infection. No evidence of osteomyelitis on radiographs Afebrile. No tachycardia, hypotension or other symptoms suggestive of severe infection. Follow up with podiatry. Return to ED for worsening systemic symptoms. Will discharge with augmentin. Return precautions discussed. Pt appears safe for discharge.   Final Clinical Impressions(s) / ED Diagnoses   Final diagnoses:  Toe infection    New Prescriptions New Prescriptions   AMOXICILLIN-CLAVULANATE (AUGMENTIN) 875-125 MG TABLET    Take 1 tablet by mouth every 12 (twelve) hours.   NAPROXEN (NAPROSYN) 375 MG TABLET    Take 1 tablet (375 mg total) by mouth 2 (two) times daily.     Etta Quill, NP 03/03/17 Virgel Bouquet    Ezequiel Essex, MD 03/03/17 [phone]

## 2017-03-03 NOTE — ED Notes (Signed)
Pt transported to xray 

## 2017-03-14 ENCOUNTER — Ambulatory Visit (INDEPENDENT_AMBULATORY_CARE_PROVIDER_SITE_OTHER): Payer: Medicare Other | Admitting: Orthopaedic Surgery

## 2017-03-24 ENCOUNTER — Other Ambulatory Visit: Payer: Self-pay | Admitting: Pain Medicine

## 2017-03-24 DIAGNOSIS — M545 Low back pain: Secondary | ICD-10-CM

## 2017-03-30 ENCOUNTER — Ambulatory Visit (INDEPENDENT_AMBULATORY_CARE_PROVIDER_SITE_OTHER): Payer: Medicare Other | Admitting: Podiatry

## 2017-03-30 ENCOUNTER — Encounter: Payer: Self-pay | Admitting: Podiatry

## 2017-03-30 VITALS — BP 110/68 | HR 69 | Resp 16

## 2017-03-30 DIAGNOSIS — L03039 Cellulitis of unspecified toe: Secondary | ICD-10-CM

## 2017-03-30 DIAGNOSIS — L600 Ingrowing nail: Secondary | ICD-10-CM

## 2017-03-30 NOTE — Progress Notes (Signed)
Subjective:    Patient ID: Peter Manning, male   DOB: 61 y.o.   MRN: [id number]   HPI patient states she's had some irritation within his left second nail night and he wanted to make sure it was okay and the hallux is been draining slightly    ROS      Objective:  Physical Exam neurovascular status intact with patient's left second nail crusted over with no erythema edema or proximal drainage and slight proximal drainage from the left hallux that's crusted over     Assessment:    Overall doing well with ingrown toenail with some crusted tissue formation     Plan:   Sterile debridement of tissue accomplished no active drainage noted applied dressing to the left hallux instructed on using that during the day and advised on soaks and will reappoint if symptoms persist and may require numbing of the toe and more aggressive debridement

## 2017-03-30 NOTE — Patient Instructions (Signed)
Soak Instructions    THE DAY AFTER THE PROCEDURE  Place 1/4 cup of epsom salts in a quart of warm tap water.  Submerge your foot or feet with outer bandage intact for the initial soak; this will allow the bandage to become moist and wet for easy lift off.  Once you remove your bandage, continue to soak in the solution for 20 minutes.  This soak should be done twice a day.  Next, remove your foot or feet from solution, blot dry the affected area and cover.  You may use a band aid large enough to cover the area or use gauze and tape.  Apply other medications to the area as directed by the doctor such as polysporin neosporin.  IF YOUR SKIN BECOMES IRRITATED WHILE USING THESE INSTRUCTIONS, IT IS OKAY TO SWITCH TO  WHITE VINEGAR AND WATER. Or you may use antibacterial soap and water to keep the toe clean  Monitor for any signs/symptoms of infection. Call the office immediately if any occur or go directly to the emergency room. Call with any questions/concerns.    Long Term Care Instructions-Post Nail Surgery  You have had your ingrown toenail and root treated with a chemical.  This chemical causes a burn that will drain and ooze like a blister.  This can drain for 6-8 weeks or longer.  It is important to keep this area clean, covered, and follow the soaking instructions dispensed at the time of your surgery.  This area will eventually dry and form a scab.  Once the scab forms you no longer need to soak or apply a dressing.  If at any time you experience an increase in pain, redness, swelling, or drainage, you should contact the office as soon as possible.    Betadine Soak Instructions  Purchase an 8 oz. bottle of BETADINE solution (Povidone)  THE DAY AFTER THE PROCEDURE  Place 1 tablespoon of betadine solution in a quart of warm tap water.  Submerge your foot or feet with outer bandage intact for the initial soak; this will allow the bandage to become moist and wet for easy lift off.  Once you  remove your bandage, continue to soak in the solution for 20 minutes.  This soak should be done twice a day.  Next, remove your foot or feet from solution, blot dry the affected area and cover.  You may use a band aid large enough to cover the area or use gauze and tape.  Apply other medications to the area as directed by the doctor such as cortisporin otic solution (ear drops) or neosporin.  IF YOUR SKIN BECOMES IRRITATED WHILE USING THESE INSTRUCTIONS, IT IS OKAY TO SWITCH TO EPSOM SALTS AND WATER OR WHITE VINEGAR AND WATER.  Long Term Care Instructions-Post Nail Surgery  You have had your ingrown toenail and root treated with a chemical.  This chemical causes a burn that will drain and ooze like a blister.  This can drain for 6-8 weeks or longer.  It is important to keep this area clean, covered, and follow the soaking instructions dispensed at the time of your surgery.  This area will eventually dry and form a scab.  Once the scab forms you no longer need to soak or apply a dressing.  If at any time you experience an increase in pain, redness, swelling, or drainage, you should contact the office as soon as possible. 

## 2017-04-17 ENCOUNTER — Inpatient Hospital Stay
Admission: RE | Admit: 2017-04-17 | Discharge: 2017-04-17 | Disposition: A | Discharge status: Home / Self Care | Payer: Medicare Other | Source: Ambulatory Visit | Attending: Pain Medicine | Admitting: Pain Medicine

## 2017-04-18 ENCOUNTER — Ambulatory Visit (INDEPENDENT_AMBULATORY_CARE_PROVIDER_SITE_OTHER): Payer: Medicare Other | Admitting: Orthopaedic Surgery

## 2017-04-28 ENCOUNTER — Ambulatory Visit
Admission: RE | Admit: 2017-04-28 | Discharge: 2017-04-28 | Disposition: A | Discharge status: Home / Self Care | Payer: Medicare Other | Source: Ambulatory Visit | Attending: Pain Medicine | Admitting: Pain Medicine

## 2017-04-28 DIAGNOSIS — M545 Low back pain: Secondary | ICD-10-CM

## 2017-04-28 MED ORDER — GADOBENATE DIMEGLUMINE 529 MG/ML IV SOLN
20.0000 mL | Freq: Once | INTRAVENOUS | Status: AC | PRN
  Administered 2017-04-28: 20 mL via INTRAVENOUS

## 2017-05-03 ENCOUNTER — Ambulatory Visit (INDEPENDENT_AMBULATORY_CARE_PROVIDER_SITE_OTHER): Payer: Medicare Other | Admitting: Orthopaedic Surgery

## 2017-05-03 ENCOUNTER — Encounter (INDEPENDENT_AMBULATORY_CARE_PROVIDER_SITE_OTHER): Payer: Self-pay | Admitting: Orthopaedic Surgery

## 2017-05-03 VITALS — BP 114/74 | HR 56 | Ht 68.0 in

## 2017-05-03 DIAGNOSIS — Z981 Arthrodesis status: Secondary | ICD-10-CM | POA: Insufficient documentation

## 2017-05-03 DIAGNOSIS — Z96651 Presence of right artificial knee joint: Secondary | ICD-10-CM | POA: Diagnosis not present

## 2017-05-03 DIAGNOSIS — Z96649 Presence of unspecified artificial hip joint: Secondary | ICD-10-CM

## 2017-05-03 DIAGNOSIS — M7542 Impingement syndrome of left shoulder: Secondary | ICD-10-CM | POA: Insufficient documentation

## 2017-05-03 MED ORDER — LIDOCAINE HCL 1 % IJ SOLN
1.0000 mL | INTRAMUSCULAR | Status: AC | PRN
  Administered 2017-05-03: 1 mL

## 2017-05-03 MED ORDER — METHYLPREDNISOLONE ACETATE 40 MG/ML IJ SUSP
40.0000 mg | INTRAMUSCULAR | Status: AC | PRN
Start: 2017-05-03 — End: 2017-05-03
  Administered 2017-05-03: 40 mg via INTRA_ARTICULAR

## 2017-05-03 MED ORDER — BUPIVACAINE HCL 0.25 % IJ SOLN
4.0000 mL | INTRAMUSCULAR | Status: AC | PRN
  Administered 2017-05-03: 4 mL via INTRA_ARTICULAR

## 2017-05-03 NOTE — Progress Notes (Signed)
Office Visit Note   Patient: Peter Manning           Date of Birth: 11-30-1955           MRN: [id number] Visit Date: 05/03/2017              Requested by: Nolene Ebbs, MD [address] [place], [place] [address] PCP: Nolene Ebbs, MD   Assessment & Plan: Visit Diagnoses: #1 impingement left shoulder  #2 history of lumbar fusion L4-S1  #3 revision hip arthroplasty  #4 right total knee arthroplasty   Follow-Up Instructions: Return in about 4 weeks (around 05/31/2017).   Orders:  Orders Placed This Encounter  Procedures  . Large Joint Injection/Arthrocentesis   No orders of the defined types were placed in this encounter.     Procedures: Large Joint Inj Date/Time: 05/03/2017 10:33 AM Performed by: Marybelle Killings Authorized by: Marybelle Killings   Consent Given by:  Patient Indications:  Pain Location:  Shoulder Site:  L subacromial bursa Needle Size:  22 G Needle Length:  1.5 inches Ultrasound Guidance: No   Fluoroscopic Guidance: No   Arthrogram: No   Medications:  4 mL bupivacaine 0.25 %; 1 mL lidocaine 1 %; 40 mg methylPREDNISolone acetate 40 MG/ML Patient tolerance:  Patient tolerated the procedure well with no immediate complications       Clinical Data: No additional findings.   Subjective: Chief Complaint  Patient presents with  . Lower Back - Pain  . Right Knee - Follow-up    HPI patient Returns had a recent MRI scan of his back which shows satisfactory decompression fusion at L4-S1. He has some adjacent level changes with mild to moderate narrowing at L3-4. Mild changes at L1-2 and L2-3. Fusion on plain radiograph is solid no residual impingement at the surgery levels. Right total knee arthroplasty is doing well he has some left knee osteoarthritis. Left shoulder is been painful to him without surgery itching over head activities. He remains on chronic pain management on oxycodone 10/325. Left shoulder bothers him at night also with dressing  and activities of daily living. He has been on pain management prescribed elsewhere with several different providers over the years that I have taken care of his orthopedic problems. Total hip arthroplasty is doing well.  Review of Systems review of systems is updated is unchanged from last office visit.   Objective: Vital Signs: BP 114/74   Pulse (!) 56   Ht 5\' 8"  (1.727 m)   Physical Exam  Constitutional: He is oriented to person, place, and time. He appears well-developed and well-nourished.  HENT:  Head: Normocephalic and atraumatic.  Eyes: Pupils are equal, round, and reactive to light. EOM are normal.  Neck: No tracheal deviation present. No thyromegaly present.  Cardiovascular: Normal rate.   Pulmonary/Chest: He has no wheezes.  Abdominal: Soft. Bowel sounds are normal.  Neurological: He is alert and oriented to person, place, and time.  Skin: Skin is warm and dry. Capillary refill takes less than 2 seconds.  Psychiatric: He has a normal mood and affect. His behavior is normal. Judgment and thought content normal.    Ortho Exam well-healed hip and knee incisions. Well-healed lumbar incision. Plus impingement left shoulder sensation over the lateral deltoid region actually nerve is intact. Long of the biceps is normal he has good deltoid biceps triceps muscle development. Lung of the biceps is stable. Positive impingement left shoulder negative right shoulder elbow reaches full extension reflexes are 2+ good cervical  range of motion. No subclavicular lymphadenopathy no rash or exposed skin.    Specialty Comments:  No specialty comments available.  Imaging: Study Result   CLINICAL DATA:  Initial evaluation for lower back pain extending into right lower extremity. History of recent surgery in May of 2018.  EXAM: MRI LUMBAR SPINE WITHOUT AND WITH CONTRAST  TECHNIQUE: Multiplanar and multiecho pulse sequences of the lumbar spine were obtained without and with intravenous  contrast.  CONTRAST:  86mL MULTIHANCE GADOBENATE DIMEGLUMINE 529 MG/ML IV SOLN  COMPARISON:  Comparison prior radiograph from 05/26/2016. Comparison also made with preoperative MRI from 04/30/2008.  FINDINGS: Segmentation: Normal segmentation. Lowest well-formed disc labeled the L5-S1 level.  Alignment: Chronic trace anterolisthesis of L4 on L5, stable. Trace 3 mm retrolisthesis of L1 on L2 also unchanged. Vertebral bodies otherwise normally aligned with preservation of the normal lumbar lordosis.  Vertebrae: Postoperative changes from prior PLIF at L4-5 and L5-S1. Susceptibility artifact mildly limits evaluation at these levels. Vertebral body heights maintained without evidence for acute or chronic fracture. Bone marrow signal intensity within normal limits. Small benign hemangioma noted within the T12 vertebral body. No other discrete or worrisome osseous lesions.  Conus medullaris: Extends to the L1 level and appears normal.  Paraspinal and other soft tissues: Normal expected postoperative changes present within the lower paraspinous soft tissues. Chronic asymmetric atrophy of the left psoas muscle noted. Multiple cysts noted within the partially visualized kidneys. Visualized visceral structures otherwise unremarkable.  Disc levels:  L1-2: 3 mm retrolisthesis. Diffuse degenerative disc bulge with disc desiccation and intervertebral space narrowing. Disc bulge slightly eccentric to the left with left far lateral reactive endplate changes with marginal endplate osteophytic spurring. Mild facet and ligamentum flavum hypertrophy. No canal stenosis. Mild left L1 foraminal narrowing. No significant right foraminal encroachment.  L2-3: Normal interspace. Moderate facet and ligamentum flavum hypertrophy. Resultant mild to moderate canal stenosis. Mild left L2 foraminal narrowing.  L3-4: Diffuse disc bulge with disc desiccation and intervertebral disc space  narrowing. Patient is status post posterior decompression with fusion. Residual facet arthrosis. Persistent mild canal with moderate bilateral lateral recess stenosis. Moderate bilateral L3 foraminal narrowing.  L4-5: Status post posterior and interbody fusion, with posterior decompression. Enhancing postoperative granulation tissue at the laminectomy defect. No residual stenosis.  L5-S1: Status post posterior and interbody fusion with posterior decompression. Enhancing granulation tissue at the laminectomy defect. No residual canal stenosis. Residual right-sided facet arthrosis with minimal endplate spurring at the right neural foramen results in moderate right L5 foraminal narrowing (series 2, image 6).  IMPRESSION: 1. Postoperative changes from prior PLIF at L4-5 and L5-S1 without residual canal stenosis. 2. Adjacent segment disease at L3-4 with resultant moderate bilateral subarticular and foraminal stenosis. 3. Residual right-sided facet arthropathy and endplate spurring at M8-U1 with resultant moderate right L5 foraminal narrowing. 4. Mild degenerative changes at L1-2 and L2-3 without significant stenosis or neural impingement.   Electronically Signed   By: Jeannine Boga M.D.   On: 04/28/2017 16:30      PMFS History: Patient Active Problem List   Diagnosis Date Noted  . History of lumbar fusion 05/03/2017  . Impingement syndrome of left shoulder 05/03/2017  . S/P total knee arthroplasty, right 12/28/2016  . Right knee DJD 10/10/2016  . 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 OSA (obstructive sleep apnea) 12/09/2011  . Chronic bronchitis ([place]) 12/09/2011  . Tobacco abuse 12/09/2011   Past Medical History:  Diagnosis Date  . Arthritis   . Bronchitis   .  Hepatitis 1976   pt. doesn't remember if A, B, C?  . History of kidney stones   . Hyperlipidemia   . Hypertension   . OSA (obstructive sleep apnea) 12/09/2011   PSG 01/03/12>>AHI 45.7, SpO2 low  73%, PLMI 0.  CPAP 15 cm H2O>>AHI 0, +R.   Marland Kitchen Pneumonia 2015  . Sleep apnea     Family History  Problem Relation Age of Onset  . Heart attack Father   . Cancer Mother     Past Surgical History:  Procedure Laterality Date  . BACK SURGERY    . JOINT REPLACEMENT    . REVISION TOTAL HIP ARTHROPLASTY Left 11/2015  . TOTAL HIP ARTHROPLASTY    . TOTAL HIP REVISION Left 11/18/2015   Procedure: LEFT TOTAL HIP REVISION;  Surgeon: Marybelle Killings, MD;  Location: [facility];  Service: Orthopedics;  Laterality: Left;  . TOTAL KNEE ARTHROPLASTY Right 10/10/2016   Procedure: TOTAL KNEE ARTHROPLASTY;  Surgeon: Marybelle Killings, MD;  Location: [facility];  Service: Orthopedics;  Laterality: Right;   Social History   Occupational History  . Not on file.   Social History Main Topics  . Smoking status: Current Some Day Smoker    Packs/day: 0.50    Years: 39.00    Types: Cigarettes  . Smokeless tobacco: Never Used     Comment: states on and off smoker  . Alcohol use No  . Drug use: No  . Sexual activity: Not on file

## 2017-05-31 ENCOUNTER — Ambulatory Visit (INDEPENDENT_AMBULATORY_CARE_PROVIDER_SITE_OTHER): Payer: Medicare Other | Admitting: Orthopaedic Surgery

## 2017-06-22 ENCOUNTER — Emergency Department (HOSPITAL_COMMUNITY): Payer: Medicare Other

## 2017-06-22 ENCOUNTER — Encounter (HOSPITAL_COMMUNITY): Payer: Self-pay

## 2017-06-22 ENCOUNTER — Emergency Department (HOSPITAL_COMMUNITY)
Admission: EM | Admit: 2017-06-22 | Discharge: 2017-06-22 | Disposition: A | Discharge status: Home / Self Care | Payer: Medicare Other | Attending: Emergency Medicine | Admitting: Emergency Medicine

## 2017-06-22 ENCOUNTER — Other Ambulatory Visit: Payer: Self-pay

## 2017-06-22 DIAGNOSIS — F17210 Nicotine dependence, cigarettes, uncomplicated: Secondary | ICD-10-CM | POA: Diagnosis not present

## 2017-06-22 DIAGNOSIS — R42 Dizziness and giddiness: Secondary | ICD-10-CM | POA: Diagnosis not present

## 2017-06-22 DIAGNOSIS — I10 Essential (primary) hypertension: Secondary | ICD-10-CM | POA: Diagnosis not present

## 2017-06-22 DIAGNOSIS — R319 Hematuria, unspecified: Secondary | ICD-10-CM | POA: Diagnosis present

## 2017-06-22 DIAGNOSIS — R739 Hyperglycemia, unspecified: Secondary | ICD-10-CM

## 2017-06-22 DIAGNOSIS — R51 Headache: Secondary | ICD-10-CM | POA: Diagnosis not present

## 2017-06-22 DIAGNOSIS — Z79899 Other long term (current) drug therapy: Secondary | ICD-10-CM | POA: Insufficient documentation

## 2017-06-22 DIAGNOSIS — Z7982 Long term (current) use of aspirin: Secondary | ICD-10-CM | POA: Diagnosis not present

## 2017-06-22 DIAGNOSIS — H538 Other visual disturbances: Secondary | ICD-10-CM | POA: Insufficient documentation

## 2017-06-22 DIAGNOSIS — N200 Calculus of kidney: Secondary | ICD-10-CM | POA: Insufficient documentation

## 2017-06-22 DIAGNOSIS — R05 Cough: Secondary | ICD-10-CM | POA: Diagnosis not present

## 2017-06-22 LAB — URINALYSIS, ROUTINE W REFLEX MICROSCOPIC
Bacteria, UA: NONE SEEN
Bilirubin Urine: NEGATIVE
GLUCOSE, UA: NEGATIVE mg/dL
KETONES UR: NEGATIVE mg/dL
LEUKOCYTES UA: NEGATIVE
Nitrite: NEGATIVE
PH: 5 (ref 5.0–8.0)
Protein, ur: NEGATIVE mg/dL
SPECIFIC GRAVITY, URINE: 1.021 (ref 1.005–1.030)
Squamous Epithelial / LPF: NONE SEEN

## 2017-06-22 LAB — CBC WITH DIFFERENTIAL/PLATELET
BASOS ABS: 0 10*3/uL (ref 0.0–0.1)
BASOS PCT: 0 %
EOS ABS: 0.1 10*3/uL (ref 0.0–0.7)
EOS PCT: 1 %
HCT: 41.6 % (ref 39.0–52.0)
Hemoglobin: 14.3 g/dL (ref 13.0–17.0)
Lymphocytes Relative: 16 %
Lymphs Abs: 1.6 10*3/uL (ref 0.7–4.0)
MCH: 32.8 pg (ref 26.0–34.0)
MCHC: 34.4 g/dL (ref 30.0–36.0)
MCV: 95.4 fL (ref 78.0–100.0)
Monocytes Absolute: 0.4 10*3/uL (ref 0.1–1.0)
Monocytes Relative: 4 %
Neutro Abs: 8 10*3/uL — ABNORMAL HIGH (ref 1.7–7.7)
Neutrophils Relative %: 79 %
PLATELETS: 259 10*3/uL (ref 150–400)
RBC: 4.36 MIL/uL (ref 4.22–5.81)
RDW: 12.9 % (ref 11.5–15.5)
WBC: 10.1 10*3/uL (ref 4.0–10.5)

## 2017-06-22 LAB — BASIC METABOLIC PANEL
ANION GAP: 8 (ref 5–15)
BUN: 18 mg/dL (ref 6–20)
CALCIUM: 9.6 mg/dL (ref 8.9–10.3)
CO2: 24 mmol/L (ref 22–32)
Chloride: 106 mmol/L (ref 101–111)
Creatinine, Ser: 0.93 mg/dL (ref 0.61–1.24)
GFR calc Af Amer: >60 mL/min (ref >60)
Glucose, Bld: 143 mg/dL — ABNORMAL HIGH (ref 65–99)
POTASSIUM: 3.6 mmol/L (ref 3.5–5.1)
SODIUM: 138 mmol/L (ref 135–145)

## 2017-06-22 MED ORDER — IOPAMIDOL (ISOVUE-300) INJECTION 61%
INTRAVENOUS | Status: AC
  Filled 2017-06-22: qty 75

## 2017-06-22 MED ORDER — IOPAMIDOL (ISOVUE-300) INJECTION 61%
INTRAVENOUS | Status: AC
  Administered 2017-06-22: 75 mL
  Filled 2017-06-22: qty 30

## 2017-06-22 NOTE — ED Provider Notes (Signed)
[facility] EMERGENCY DEPARTMENT Provider Note   CSN: [id number] Arrival date & time: 06/22/17  1731     History   Chief Complaint No chief complaint on file.   HPI Peter Manning is a 61 y.o. male.  Patient reports hematuria for unknown length of time.  Additionally, he states dizziness, headaches, blurred vision intermittently.  He smokes cigarettes and has a productive cough with yellow sputum.  No substernal chest pain.  No fever, sweats, chills, stiff neck, gross neurological deficits.  He is ambulatory.  Past medical history includes obesity, pneumonia, sleep apnea, hypertension, hyperlipidemia, kidney stones, "hepatitis.  Severity of symptoms is mild.  Nothing makes symptoms better or worse      Past Medical History:  Diagnosis Date  . Arthritis   . Bronchitis   . Hepatitis 1976   pt. doesn't remember if A, B, C?  . History of kidney stones   . Hyperlipidemia   . Hypertension   . OSA (obstructive sleep apnea) 12/09/2011   PSG 01/03/12>>AHI 45.7, SpO2 low 73%, PLMI 0.  CPAP 15 cm H2O>>AHI 0, +R.   Marland Kitchen Pneumonia 2015  . Sleep apnea     Patient Active Problem List   Diagnosis Date Noted  . History of lumbar fusion 05/03/2017  . Impingement syndrome of left shoulder 05/03/2017  . S/P total knee arthroplasty, right 12/28/2016  . Right knee DJD 10/10/2016  . 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 OSA (obstructive sleep apnea) 12/09/2011  . Chronic bronchitis ([place]) 12/09/2011  . Tobacco abuse 12/09/2011    Past Surgical History:  Procedure Laterality Date  . BACK SURGERY    . JOINT REPLACEMENT    . REVISION TOTAL HIP ARTHROPLASTY Left 11/2015  . TOTAL HIP ARTHROPLASTY    . TOTAL HIP REVISION Left 11/18/2015   Procedure: LEFT TOTAL HIP REVISION;  Surgeon: Marybelle Killings, MD;  Location: [facility];  Service: Orthopedics;  Laterality: Left;  . TOTAL KNEE ARTHROPLASTY Right 10/10/2016   Procedure: TOTAL KNEE ARTHROPLASTY;  Surgeon: Marybelle Killings, MD;   Location: [facility];  Service: Orthopedics;  Laterality: Right;       Home Medications    Prior to Admission medications   Medication Sig Start Date End Date Taking? Authorizing Provider  albuterol (PROVENTIL HFA;VENTOLIN HFA) 108 (90 BASE) MCG/ACT inhaler Inhale 1-2 puffs into the lungs every 6 (six) hours as needed for wheezing or shortness of breath. 04/17/14  Yes Hyman Bible, PA-C  amLODipine (NORVASC) 10 MG tablet Take 10 mg by mouth daily.  08/24/16  Yes [provider]  oxyCODONE-acetaminophen (PERCOCET) 10-325 MG tablet Take 1 tablet by mouth every 6 (six) hours as needed for pain.  10/25/16  Yes [provider]  aspirin EC 325 MG EC tablet Take 1 tablet (325 mg total) by mouth daily with breakfast. Patient not taking: Reported on 06/22/2017 10/12/16   Lanae Crumbly, PA-C  benzonatate (TESSALON) 100 MG capsule Take 2 capsules (200 mg total) by mouth 2 (two) times daily as needed for cough. Patient not taking: Reported on 06/22/2017 12/31/16   Nona Dell, PA-C  cephALEXin (KEFLEX) 500 MG capsule Take 1 capsule (500 mg total) by mouth 3 (three) times daily. Patient not taking: Reported on 06/22/2017 02/06/17   Wallene Huh, DPM  fluticasone [facility]) 50 MCG/ACT nasal spray Place 1 spray into both nostrils daily. Patient not taking: Reported on 06/22/2017 12/31/16   Nona Dell, PA-C  ibuprofen (ADVIL,MOTRIN) 600 MG tablet Take  1 tablet (600 mg total) by mouth every 6 (six) hours as needed. Patient not taking: Reported on 06/22/2017 12/31/16   Nona Dell, PA-C  methocarbamol (ROBAXIN) 500 MG tablet Take 1 tablet (500 mg total) by mouth every 6 (six) hours as needed for muscle spasms. Patient not taking: Reported on 06/22/2017 10/12/16   Lanae Crumbly, PA-C  naproxen (NAPROSYN) 375 MG tablet Take 1 tablet (375 mg total) by mouth 2 (two) times daily. Patient not taking: Reported on 06/22/2017 03/03/17   Etta Quill, NP    oxyCODONE-acetaminophen (PERCOCET/ROXICET) 5-325 MG tablet Take 2 tablets by mouth every 4 (four) hours as needed. Patient not taking: Reported on 06/22/2017 11/02/16   Tanna Furry, MD  polyethylene glycol [facility] Floria Raveling) packet Take 17 g by mouth daily as needed for mild constipation. Patient not taking: Reported on 06/22/2017 10/12/16   Lanae Crumbly, PA-C  potassium chloride SA (K-DUR,KLOR-CON) 20 MEQ tablet Take 1 tablet (20 mEq total) by mouth daily. Patient not taking: Reported on 06/22/2017 12/04/16   Julianne Rice, MD    Family History Family History  Problem Relation Age of Onset  . Heart attack Father   . Cancer Mother     Social History Social History   Tobacco Use  . Smoking status: Current Some Day Smoker    Packs/day: 0.50    Years: 39.00    Pack years: 19.50    Types: Cigarettes  . Smokeless tobacco: Never Used  . Tobacco comment: states on and off smoker  Substance Use Topics  . Alcohol use: No  . Drug use: No     Allergies   Patient has no known allergies.   Review of Systems Review of Systems  All other systems reviewed and are negative.    Physical Exam Updated Vital Signs BP 112/72   Pulse (!) 55   Temp 98.2 F (36.8 C) (Oral)   Resp 15   Ht 5' 8.5" (1.74 m)   Wt 108.9 kg (240 lb)   SpO2 99%   BMI 35.96 kg/m   Physical Exam  Constitutional: He is oriented to person, place, and time. He appears well-developed and well-nourished.  HENT:  Head: Normocephalic and atraumatic.  Eyes: Conjunctivae are normal.  Neck: Neck supple.  Cardiovascular: Normal rate and regular rhythm.  Pulmonary/Chest: Effort normal and breath sounds normal.  Abdominal: Soft. Bowel sounds are normal.  Genitourinary:  Genitourinary Comments: Minimal left flank tenderness  Musculoskeletal: Normal range of motion.  Neurological: He is alert and oriented to person, place, and time.  Skin: Skin is warm and dry.  Psychiatric: He has a normal mood and affect.  His behavior is normal.  Nursing note and vitals reviewed.    ED Treatments / Results  Labs (all labs ordered are listed, but only abnormal results are displayed) Labs Reviewed  CBC WITH DIFFERENTIAL/PLATELET - Abnormal; Notable for the following components:      Result Value   Neutro Abs 8.0 (*)    All other components within normal limits  BASIC METABOLIC PANEL - Abnormal; Notable for the following components:   Glucose, Bld 143 (*)    All other components within normal limits  URINALYSIS, ROUTINE W REFLEX MICROSCOPIC - Abnormal; Notable for the following components:   APPearance HAZY (*)    Hgb urine dipstick MODERATE (*)    All other components within normal limits    EKG  EKG Interpretation  Date/Time:  Thursday June 22 2017 17:45:20 EST Ventricular Rate:  65 PR Interval:  146 QRS Duration: 98 QT Interval:  404 QTC Calculation: 420 R Axis:   54 Text Interpretation:  Normal sinus rhythm Nonspecific T wave abnormality Abnormal ECG Confirmed by Nat Christen [phone]) on 06/22/2017 7:40:21 PM       Radiology Dg Chest 2 View  Result Date: 06/22/2017 CLINICAL DATA:  Shortness of breath with cough and dizziness 3 days. EXAM: CHEST  2 VIEW COMPARISON:  12/31/2016 FINDINGS: The heart size and mediastinal contours are within normal limits. Both lungs are clear. Moderate degenerate change of the spine with prominent paravertebral osteophytes. IMPRESSION: No active cardiopulmonary disease. Electronically Signed   By: Marin Olp M.D.   On: 06/22/2017 18:31   Ct Head Wo Contrast  Result Date: 06/22/2017 CLINICAL DATA:  Episodic vertigo. Headache and blurred vision. Symptoms for 3 days. EXAM: CT HEAD WITHOUT CONTRAST TECHNIQUE: Contiguous axial images were obtained from the base of the skull through the vertex without intravenous contrast. COMPARISON:  None. FINDINGS: Brain: Brain volume is normal for age. No intracranial hemorrhage, mass effect, or midline shift. No  hydrocephalus. The basilar cisterns are patent. No evidence of territorial infarct or acute ischemia. No extra-axial or intracranial fluid collection. Vascular: No hyperdense vessel or unexpected calcification. Skull: No fracture or focal lesion. Sinuses/Orbits: Paranasal sinuses and mastoid air cells are clear. The visualized orbits are unremarkable. Other: None. IMPRESSION: Unremarkable noncontrast head CT. Electronically Signed   By: Jeb Levering M.D.   On: 06/22/2017 21:13   Ct Abdomen Pelvis W Contrast  Result Date: 06/22/2017 CLINICAL DATA:  Left flank pain EXAM: CT ABDOMEN AND PELVIS WITH CONTRAST TECHNIQUE: Multidetector CT imaging of the abdomen and pelvis was performed using the standard protocol following bolus administration of intravenous contrast. CONTRAST:  32mL ISOVUE-300 IOPAMIDOL (ISOVUE-300) INJECTION 61% COMPARISON:  06/06/2016 FINDINGS: Lower chest: Lung bases demonstrate no acute consolidation or effusion. Normal heart size. Hepatobiliary: Mild steatosis. Contracted gallbladder. No calcified stones are biliary dilatation Pancreas: Unremarkable. No pancreatic ductal dilatation or surrounding inflammatory changes. Spleen: Normal in size without focal abnormality. Adrenals/Urinary Tract: Adrenal glands are within normal limits. Multiple hypodense lesions within both kidneys, larger lesions are consistent with cysts. No significant left hydronephrosis. There is enlargement of the distal left ureter. There are multiple stones in the distal left ureter on the coronal views, most cephalad stone measures 6 mm. Inferior to this is an additional 8 mm stone. Distal to this is a 4 mm stone. This is just above the left UVJ. Bladder otherwise unremarkable. Stomach/Bowel: Stomach is within normal limits. Appendix appears normal. No evidence of bowel wall thickening, distention, or inflammatory changes. Vascular/Lymphatic: Aortic atherosclerosis. No enlarged abdominal or pelvic lymph nodes.  Reproductive: Prostate is unremarkable. Other: Negative for free air or free fluid. Small fat in the left inguinal canal. Small fat in the umbilicus. Musculoskeletal: Posterior decompression changes with surgical rod and fixating screws at L4-L5 and L5-S1. SI joint disease. IMPRESSION: 1. Dilation of the distal left ureter ; there appear to be at least 3 stones in the distal left ureter just above the left UVJ, however this does not produce significant hydronephrosis in the left kidney. 2. There are otherwise no acute abnormalities visualized. Electronically Signed   By: Donavan Foil M.D.   On: 06/22/2017 21:20    Procedures Procedures (including critical care time)  Medications Ordered in ED Medications  iopamidol (ISOVUE-300) 61 % injection (not administered)  iopamidol (ISOVUE-300) 61 % injection (75 mLs  Contrast Given 06/22/17 2050)  Initial Impression / Assessment and Plan / ED Course  I have reviewed the triage vital signs and the nursing notes.  Pertinent labs & imaging results that were available during my care of the patient were reviewed by me and considered in my medical decision making (see chart for details).     Patient is exhibiting no neurological deficits.  Urinalysis shows evidence of hemoglobin.  CT head negative.  CT abdomen/pelvis reveal 3 distal left ureteral stones.  No hydronephrosis.  Glucose minimally elevated.  These findings were discussed with the patient and his wife.  He will follow-up with urology.  Final Clinical Impressions(s) / ED Diagnoses   Final diagnoses:  Kidney stone on left side  Hyperglycemia    ED Discharge Orders    None       Nat Christen, MD 06/22/17 2336

## 2017-06-22 NOTE — Discharge Instructions (Signed)
Urinalysis shows blood in your urine.  You also have several small kidney stones in your left ureter.  Your sugar or glucose is slightly elevated.  Follow-up with urologist.

## 2017-06-22 NOTE — ED Triage Notes (Signed)
Pt presents with multiple complaints.  Pt reports over the past 3 days, he has had headaches, blurred vision, dizziness, L flank pain and dry mouth.  Pt reports productive cough with yellow phlegm with shortness of breath.

## 2017-06-30 ENCOUNTER — Other Ambulatory Visit: Payer: Self-pay | Admitting: Urology

## 2017-07-03 ENCOUNTER — Encounter (HOSPITAL_BASED_OUTPATIENT_CLINIC_OR_DEPARTMENT_OTHER): Payer: Self-pay

## 2017-07-03 ENCOUNTER — Other Ambulatory Visit: Payer: Self-pay

## 2017-07-03 NOTE — Progress Notes (Signed)
Spoke with: Broadus John NPO: after Midnight (no gum, candy, mints, or smoking) Arrival time: 7:00 AM Labs: Drawn 06/22/17 AM medications: Amlodipine Pre op orders: Yes Ride home: [name] (sister) [phone]

## 2017-07-05 NOTE — H&P (Signed)
CC: I have blood in my urine.  HPI: Peter Manning is a 62 year-old male established patient who is here for blood in the urine.  Seen earlier this year for microscopic hematuria evaluation. Patient had a CT scan performed in December 2017 showing a benign renal cyst as well as a nonobstructing calculi in the left kidney. Cystoscopy revealed a very friable prostate. Patient began on finasteride. He states forgetting to follow up in 3 months as scheduled.   02/22/17: Seen last week by an urgent care for gross painless hematuria. Not prescribed antibiotics but recommended to follow-up with urology. Patient does continue finasteride. He has obstructive voiding symptoms including hesitancy, weak stream, and nocturia up to 3 times per night. Denies any dysuria. Not having any flank or back pain. No recent fevers or endocardial treatment for infection. He has had several orthopedic procedures in the past several years. He's had bilateral hip replacement as well as lumbar surgery. He recently had a right knee replacement in the spring of this year.  Not taking aspirin or other anticoagulants.    June 23, 2017: After discussion with the patient's urologist, CT hematuria evaluation with follow-up cystoscopy was ordered. Unfortunately, the patient was unable to be contacted for follow-up appointments.   Presents today after being seen in the emergency department yesterday for left-sided pain as well as headaches. CT abdomen/pelvis with contrast was performed and on imaging there were 3 distal ureteral calculi near the left UVJ present on this study with corresponding distal ureteral dilation. The left kidney did not have any obvious hydronephrosis however. Each stone measured 6, 8, & 4 mm respectively proximal-distally. HU avg. <400. I could not identify the stones on scout imaging. These stones were first identified as non-obstructing in the left kidney on previous exams. Not having pain today, he presents  for re-evaluation. Patient states not seeing recent gross hematuria. He does think he's had some increased frequency urgency and hesitancy but is unsure how long these symptoms have been present.   He did see the blood in his urine. He has not seen blood clots.   He does not have a burning sensation when he urinates. He is currently having trouble urinating.   He is not having pain. He has not recently had unwanted weight loss.   His last U/S or CT Scan was 06/23/2017.     ALLERGIES: None   MEDICATIONS: Aspirin 325 mg tablet  Finasteride 5 mg tablet 1 tablet PO Daily  Amlodipine Besylate  Benzonatate  Cephalexin  Fluticasone Propionate  Methocarbamol  Naproxen  Oxycodone-Acetaminophen  Potassium Chloride     GU PSH: Cystoscopy - 09/22/2016      PSH Notes: Right total knee replacement in April of this year.   NON-GU PSH: Back surgery Hip Arthroscopy/surgery, Left    GU PMH: Gross hematuria - 02/22/2017, He has had intermittent gross hematuria that appears to be secondary to his enlarged prostate with prominent vessels. I am going to see if Dr. Jeanie Cooks has a PSA on him. I am going to get him started on finasteride for the time being and will have him return in 3 months for reevaluation. , - 09/22/2016 BPH w/LUTS, He has BPH with BOO on cystoscopy but minimal LUTS. - 09/22/2016 Renal calculus, He has small left renal stones without obstruction on CT in 12/17. No treatment needed and not a likely cause of the hematuria. - 09/22/2016 Renal cyst, He has a large left renal cyst that appears benign. - 09/22/2016  NON-GU PMH: Arthritis Hypertension Sleep Apnea    FAMILY HISTORY: 2 daughters - Other 2 sons - Other   SOCIAL HISTORY: Marital Status: Single Preferred Language: English; Race: Black or African American Current Smoking Status: Patient smokes.   Tobacco Use Assessment Completed: Used Tobacco in last 30 days? Drinks 4+ caffeinated drinks per day.    REVIEW OF  SYSTEMS:    GU Review Male:   Patient reports frequent urination, get up at night to urinate, stream starts and stops, and have to strain to urinate . Patient denies hard to postpone urination, burning/ pain with urination, leakage of urine, trouble starting your stream, erection problems, and penile pain.  Gastrointestinal (Upper):   Patient denies nausea, vomiting, and indigestion/ heartburn.  Gastrointestinal (Lower):   Patient denies diarrhea and constipation.  Constitutional:   Patient denies fever, night sweats, weight loss, and fatigue.  Skin:   Patient denies itching and skin rash/ lesion.  Eyes:   Patient reports blurred vision and double vision.   Ears/ Nose/ Throat:   Patient denies sore throat and sinus problems.  Hematologic/Lymphatic:   Patient denies swollen glands and easy bruising.  Cardiovascular:   Patient reports leg swelling and chest pains.   Respiratory:   Patient reports cough. Patient denies shortness of breath.  Endocrine:   Patient reports excessive thirst.   Musculoskeletal:   Patient reports back pain. Patient denies joint pain.  Neurological:   Patient reports headaches. Patient denies dizziness.  Psychologic:   Patient denies depression and anxiety.   VITAL SIGNS:      06/23/2017 10:16 AM  BP 121/75 mmHg  Pulse 57 /min   MULTI-SYSTEM PHYSICAL EXAMINATION:    Constitutional: Well-nourished. No physical deformities. Normally developed. Good grooming.  Neck: Neck symmetrical, not swollen. Normal tracheal position.  Respiratory: No labored breathing, no use of accessory muscles. CTA. Some upper airway congestion noted.  Cardiovascular: Normal temperature, RRR without murmur.   Lymphatic: No enlargement of neck, axillae, groin.  Skin: No paleness, no jaundice, no cyanosis. No lesion, no ulcer, no rash.  Neurologic / Psychiatric: Oriented to time, oriented to place, oriented to person. No depression, no anxiety, no agitation.  Gastrointestinal: Obese abdomen. No  hernia. No mass, no tenderness, no rigidity. No CVAT. No flank or suprapubic tenderness.     PAST DATA REVIEWED:  Source Of History:  Patient  Records Review:   Previous Patient Records  Urine Test Review:   Urinalysis  X-Ray Review: C.T. Abdomen/Pelvis: Reviewed Films. Discussed With Patient.     02/23/16  PSA  Total PSA 1.9 ng/dl    PROCEDURES:          Urinalysis w/Scope Dipstick Dipstick Cont'd Micro  Color: Yellow Bilirubin: Neg WBC/hpf: 0 - 5/hpf  Appearance: Clear Ketones: Neg RBC/hpf: 3 - 10/hpf  Specific Gravity: 1.025 Blood: 1+ Bacteria: NS (Not Seen)  pH: 5.5 Protein: 1+ Cystals: NS (Not Seen)  Glucose: Neg Urobilinogen: 0.2 Casts: NS (Not Seen)    Nitrites: Neg Trichomonas: Not Present    Leukocyte Esterase: Neg Mucous: Not Present      Epithelial Cells: NS (Not Seen)      Yeast: NS (Not Seen)      Sperm: Not Present    ASSESSMENT:      ICD-10 Details  1 GU:   Ureteral calculus - N20.1 Left, He has 3 moderately sized low-density calculi along the distal left ureter just proximal to the left UVJ with associated ureteral dilation but no significant hydronephrosis noted within  the kidney itself.  2   Gross hematuria - R31.0    PLAN:           Orders Labs Urine Culture          Schedule Return Visit/Planned Activity: Keep Scheduled Appointment - Schedule Surgery          Document Letter(s):  Created for Patient: Clinical Summary         Notes:   I'm going to reach out again to his urologist about plan of care. I double checked with patient and made sure we had the correct telephone number on file as he has been difficult to reach in the past. He will likely need procedural intervention to treat the calculi noted in the distal ureter. I am going to review the ER notes as well to check kidney function. Due to the Hounsfield units of the stones by deferred KUB imaging today. Having 3 distal calculi likely makes him not a candidate for shockwave therapy. I'm  unsure about how long these calculi have been in place but do give good and reasonable explanation for the hematuria he has been experiencing as well as the left-sided pain yesterday. I discussed continued observation versus ureteroscopy. The procedure as well as risks and complications were discussed with the patient. These include but are not limited to infection, injury to the urinary tract i.e. ureteral disruption/evulsion, bleeding, stent discomfort, anesthetic complications, among others. Once I discussed with his urologist, I will follow-up regarding plan of care moving forward. If he has new or worsening symptoms, follow-up instructions for clinic as well as the emergency department given.

## 2017-07-06 ENCOUNTER — Other Ambulatory Visit: Payer: Self-pay

## 2017-07-06 ENCOUNTER — Ambulatory Visit (HOSPITAL_BASED_OUTPATIENT_CLINIC_OR_DEPARTMENT_OTHER): Payer: Medicare Other | Admitting: Anesthesiology

## 2017-07-06 ENCOUNTER — Encounter (HOSPITAL_BASED_OUTPATIENT_CLINIC_OR_DEPARTMENT_OTHER)
Admission: RE | Disposition: A | Discharge status: Home / Self Care | Payer: Self-pay | Source: Ambulatory Visit | Attending: Urology

## 2017-07-06 ENCOUNTER — Encounter (HOSPITAL_BASED_OUTPATIENT_CLINIC_OR_DEPARTMENT_OTHER): Payer: Self-pay | Admitting: *Deleted

## 2017-07-06 ENCOUNTER — Ambulatory Visit (HOSPITAL_BASED_OUTPATIENT_CLINIC_OR_DEPARTMENT_OTHER)
Admission: RE | Admit: 2017-07-06 | Discharge: 2017-07-06 | Disposition: A | Discharge status: Home / Self Care | Payer: Medicare Other | Source: Ambulatory Visit | Attending: Urology | Admitting: Urology

## 2017-07-06 DIAGNOSIS — N3289 Other specified disorders of bladder: Secondary | ICD-10-CM | POA: Diagnosis not present

## 2017-07-06 DIAGNOSIS — N201 Calculus of ureter: Secondary | ICD-10-CM | POA: Insufficient documentation

## 2017-07-06 DIAGNOSIS — Z7982 Long term (current) use of aspirin: Secondary | ICD-10-CM | POA: Diagnosis not present

## 2017-07-06 DIAGNOSIS — Z79899 Other long term (current) drug therapy: Secondary | ICD-10-CM | POA: Insufficient documentation

## 2017-07-06 DIAGNOSIS — N401 Enlarged prostate with lower urinary tract symptoms: Secondary | ICD-10-CM | POA: Insufficient documentation

## 2017-07-06 DIAGNOSIS — Z6836 Body mass index (BMI) 36.0-36.9, adult: Secondary | ICD-10-CM | POA: Diagnosis not present

## 2017-07-06 DIAGNOSIS — F17200 Nicotine dependence, unspecified, uncomplicated: Secondary | ICD-10-CM | POA: Insufficient documentation

## 2017-07-06 DIAGNOSIS — Q6263 Anomalous implantation of ureter: Secondary | ICD-10-CM | POA: Diagnosis not present

## 2017-07-06 DIAGNOSIS — M1990 Unspecified osteoarthritis, unspecified site: Secondary | ICD-10-CM | POA: Diagnosis not present

## 2017-07-06 DIAGNOSIS — Z96651 Presence of right artificial knee joint: Secondary | ICD-10-CM | POA: Insufficient documentation

## 2017-07-06 DIAGNOSIS — N138 Other obstructive and reflux uropathy: Secondary | ICD-10-CM | POA: Diagnosis not present

## 2017-07-06 DIAGNOSIS — I10 Essential (primary) hypertension: Secondary | ICD-10-CM | POA: Insufficient documentation

## 2017-07-06 DIAGNOSIS — Z96643 Presence of artificial hip joint, bilateral: Secondary | ICD-10-CM | POA: Diagnosis not present

## 2017-07-06 DIAGNOSIS — G4730 Sleep apnea, unspecified: Secondary | ICD-10-CM | POA: Diagnosis not present

## 2017-07-06 HISTORY — PX: CYSTOSCOPY WITH RETROGRADE PYELOGRAM, URETEROSCOPY AND STENT PLACEMENT: SHX5789

## 2017-07-06 SURGERY — CYSTOURETEROSCOPY, WITH RETROGRADE PYELOGRAM AND STENT INSERTION
Anesthesia: General | Laterality: Left

## 2017-07-06 MED ORDER — LIDOCAINE 2% (20 MG/ML) 5 ML SYRINGE
INTRAMUSCULAR | Status: AC
  Filled 2017-07-06: qty 5

## 2017-07-06 MED ORDER — MIDAZOLAM HCL 5 MG/5ML IJ SOLN
INTRAMUSCULAR | Status: DC | PRN
  Administered 2017-07-06: 2 mg via INTRAVENOUS

## 2017-07-06 MED ORDER — ONDANSETRON HCL 4 MG/2ML IJ SOLN
INTRAMUSCULAR | Status: DC | PRN
  Administered 2017-07-06: 4 mg via INTRAVENOUS

## 2017-07-06 MED ORDER — PHENAZOPYRIDINE HCL 200 MG PO TABS
200.0000 mg | ORAL_TABLET | Freq: Three times a day (TID) | ORAL | Status: DC
  Administered 2017-07-06: 100 mg via ORAL
  Filled 2017-07-06: qty 1

## 2017-07-06 MED ORDER — FENTANYL CITRATE (PF) 100 MCG/2ML IJ SOLN
INTRAMUSCULAR | Status: AC
  Filled 2017-07-06: qty 2

## 2017-07-06 MED ORDER — FENTANYL CITRATE (PF) 100 MCG/2ML IJ SOLN
25.0000 ug | INTRAMUSCULAR | Status: DC | PRN
  Filled 2017-07-06: qty 1

## 2017-07-06 MED ORDER — PHENAZOPYRIDINE HCL 200 MG PO TABS: 200.0000 mg | ORAL_TABLET | Freq: Three times a day (TID) | ORAL | 1 refills | Status: DC | PRN

## 2017-07-06 MED ORDER — DEXAMETHASONE SODIUM PHOSPHATE 10 MG/ML IJ SOLN
INTRAMUSCULAR | Status: AC
  Filled 2017-07-06: qty 1

## 2017-07-06 MED ORDER — PROPOFOL 10 MG/ML IV BOLUS
INTRAVENOUS | Status: DC | PRN
  Administered 2017-07-06: 250 mg via INTRAVENOUS

## 2017-07-06 MED ORDER — PROPOFOL 10 MG/ML IV BOLUS
INTRAVENOUS | Status: AC
  Filled 2017-07-06: qty 40

## 2017-07-06 MED ORDER — KETOROLAC TROMETHAMINE 30 MG/ML IJ SOLN
INTRAMUSCULAR | Status: DC | PRN
  Administered 2017-07-06: 30 mg via INTRAVENOUS

## 2017-07-06 MED ORDER — KETOROLAC TROMETHAMINE 30 MG/ML IJ SOLN
INTRAMUSCULAR | Status: AC
  Filled 2017-07-06: qty 1

## 2017-07-06 MED ORDER — LIDOCAINE 2% (20 MG/ML) 5 ML SYRINGE
INTRAMUSCULAR | Status: DC | PRN
  Administered 2017-07-06: 100 mg via INTRAVENOUS

## 2017-07-06 MED ORDER — MIDAZOLAM HCL 2 MG/2ML IJ SOLN
INTRAMUSCULAR | Status: AC
  Filled 2017-07-06: qty 2

## 2017-07-06 MED ORDER — DEXAMETHASONE SODIUM PHOSPHATE 10 MG/ML IJ SOLN
INTRAMUSCULAR | Status: DC | PRN
  Administered 2017-07-06: 10 mg via INTRAVENOUS

## 2017-07-06 MED ORDER — PROMETHAZINE HCL 25 MG/ML IJ SOLN
6.2500 mg | INTRAMUSCULAR | Status: DC | PRN
  Filled 2017-07-06: qty 1

## 2017-07-06 MED ORDER — FENTANYL CITRATE (PF) 100 MCG/2ML IJ SOLN
INTRAMUSCULAR | Status: DC | PRN
  Administered 2017-07-06 (×2): 50 ug via INTRAVENOUS

## 2017-07-06 MED ORDER — CEFAZOLIN SODIUM-DEXTROSE 2-4 GM/100ML-% IV SOLN
INTRAVENOUS | Status: AC
  Filled 2017-07-06: qty 100

## 2017-07-06 MED ORDER — LACTATED RINGERS IV SOLN
INTRAVENOUS | Status: DC
  Administered 2017-07-06: 07:00:00 via INTRAVENOUS
  Filled 2017-07-06: qty 1000

## 2017-07-06 MED ORDER — PHENAZOPYRIDINE HCL 100 MG PO TABS
ORAL_TABLET | ORAL | Status: AC
  Filled 2017-07-06: qty 2

## 2017-07-06 MED ORDER — OXYCODONE-ACETAMINOPHEN 10-325 MG PO TABS: 1.0000 | ORAL_TABLET | Freq: Four times a day (QID) | ORAL | 0 refills | Status: DC | PRN

## 2017-07-06 MED ORDER — ONDANSETRON HCL 4 MG/2ML IJ SOLN
INTRAMUSCULAR | Status: AC
  Filled 2017-07-06: qty 2

## 2017-07-06 MED ORDER — CEFAZOLIN SODIUM-DEXTROSE 2-4 GM/100ML-% IV SOLN
2.0000 g | INTRAVENOUS | Status: AC
  Administered 2017-07-06: 2 g via INTRAVENOUS
  Filled 2017-07-06: qty 100

## 2017-07-06 MED FILL — OXYCODONE-APAP 10-325 MG TA: 10-325 | 3 days supply | Qty: 15 | Fill #0

## 2017-07-06 SURGICAL SUPPLY — 31 items
BAG DRAIN URO-CYSTO SKYTR STRL (DRAIN) ×3 IMPLANT
BAG DRN UROCATH (DRAIN) ×1
BASKET STONE 1.7 NGAGE (UROLOGICAL SUPPLIES) ×2 IMPLANT
BASKET STONE NCOMPASS (UROLOGICAL SUPPLIES) ×2 IMPLANT
BASKET ZERO TIP NITINOL 2.4FR (BASKET) IMPLANT
BSKT STON RTRVL ZERO TP 2.4FR (BASKET)
CATH ROBINSON RED A/P 16FR (CATHETERS) ×2 IMPLANT
CATH URET 5FR 28IN CONE TIP (BALLOONS)
CATH URET 5FR 28IN OPEN ENDED (CATHETERS) IMPLANT
CATH URET 5FR 70CM CONE TIP (BALLOONS) IMPLANT
CLOTH BEACON ORANGE TIMEOUT ST (SAFETY) ×3 IMPLANT
ELECT REM PT RETURN 9FT ADLT (ELECTROSURGICAL)
ELECTRODE REM PT RTRN 9FT ADLT (ELECTROSURGICAL) IMPLANT
FIBER LASER FLEXIVA 365 (UROLOGICAL SUPPLIES) ×2 IMPLANT
FIBER LASER TRAC TIP (UROLOGICAL SUPPLIES) IMPLANT
GLOVE SURG SS PI 8.0 STRL IVOR (GLOVE) ×3 IMPLANT
GOWN STRL REUS W/TWL XL LVL3 (GOWN DISPOSABLE) ×3 IMPLANT
GUIDEWIRE 0.038 PTFE COATED (WIRE) IMPLANT
GUIDEWIRE ANG ZIPWIRE 038X150 (WIRE) IMPLANT
GUIDEWIRE STR DUAL SENSOR (WIRE) ×3 IMPLANT
INFUSOR MANOMETER BAG 3000ML (MISCELLANEOUS) ×3 IMPLANT
IV NS IRRIG 3000ML ARTHROMATIC (IV SOLUTION) ×5 IMPLANT
KIT BALLN UROMAX 15FX4 (MISCELLANEOUS) IMPLANT
KIT BALLN UROMAX 26 75X4 (MISCELLANEOUS) ×2
KIT RM TURNOVER CYSTO AR (KITS) ×3 IMPLANT
MANIFOLD NEPTUNE II (INSTRUMENTS) ×3 IMPLANT
NS IRRIG 500ML POUR BTL (IV SOLUTION) ×1 IMPLANT
PACK CYSTO (CUSTOM PROCEDURE TRAY) ×3 IMPLANT
STENT URET 6FRX26 CONTOUR (STENTS) ×2 IMPLANT
TUBE CONNECTING 12'X1/4 (SUCTIONS) ×1
TUBE CONNECTING 12X1/4 (SUCTIONS) ×1 IMPLANT

## 2017-07-06 NOTE — Discharge Instructions (Addendum)
Post Anesthesia Home Care Instructions  Activity: Get plenty of rest for the remainder of the day. A responsible individual must stay with you for 24 hours following the procedure.  For the next 24 hours, DO NOT: -Drive a car -Paediatric nurse -Drink alcoholic beverages -Take any medication unless instructed by your physician -Make any legal decisions or sign important papers.  Meals: Start with liquid foods such as gelatin or soup. Progress to regular foods as tolerated. Avoid greasy, spicy, heavy foods. If nausea and/or vomiting occur, drink only clear liquids until the nausea and/or vomiting subsides. Call your physician if vomiting continues.  Special Instructions/Symptoms: Your throat may feel dry or sore from the anesthesia or the breathing tube placed in your throat during surgery. If this causes discomfort, gargle with warm salt water. The discomfort should disappear within 24 hours.  If you had a scopolamine patch placed behind your ear for the management of post- operative nausea and/or vomiting:  1. The medication in the patch is effective for 72 hours, after which it should be removed.  Wrap patch in a tissue and discard in the trash. Wash hands thoroughly with soap and water. 2. You may remove the patch earlier than 72 hours if you experience unpleasant side effects which may include dry mouth, dizziness or visual disturbances. 3. Avoid touching the patch. Wash your hands with soap and water after contact with the patch.   Ureteral Stent Implantation, Care After Refer to this sheet in the next few weeks. These instructions provide you with information about caring for yourself after your procedure. Your health care provider may also give you more specific instructions. Your treatment has been planned according to current medical practices, but problems sometimes occur. Call your health care provider if you have any problems or questions after your procedure. What can I expect  after the procedure? After the procedure, it is common to have:  Nausea.  Mild pain when you urinate. You may feel this pain in your lower back or lower abdomen. Pain should stop within a few minutes after you urinate. This may last for up to 1 week.  A small amount of blood in your urine for several days.  Follow these instructions at home:  Medicines  Take over-the-counter and prescription medicines only as told by your health care provider.  If you were prescribed an antibiotic medicine, take it as told by your health care provider. Do not stop taking the antibiotic even if you start to feel better.  Do not drive for 24 hours if you received a sedative.  Do not drive or operate heavy machinery while taking prescription pain medicines. Activity  Return to your normal activities as told by your health care provider. Ask your health care provider what activities are safe for you.  Do not lift anything that is heavier than 10 lb (4.5 kg). Follow this limit for 1 week after your procedure, or for as long as told by your health care provider. General instructions  Watch for any blood in your urine. Call your health care provider if the amount of blood in your urine increases.  If you have a catheter: ? Follow instructions from your health care provider about taking care of your catheter and collection bag. ? Do not take baths, swim, or use a hot tub until your health care provider approves.  Drink enough fluid to keep your urine clear or pale yellow.  Keep all follow-up visits as told by your health care provider.  This is important. Contact a health care provider if:  You have pain that gets worse or does not get better with medicine, especially pain when you urinate.  You have difficulty urinating.  You feel nauseous or you vomit repeatedly during a period of more than 2 days after the procedure. Get help right away if:  Your urine is dark red or has blood clots in  it.  You are leaking urine (have incontinence).  The end of the stent comes out of your urethra.  You cannot urinate.  You have sudden, sharp, or severe pain in your abdomen or lower back.  You have a fever.  You may remove the stent by pulling the attached string on Monday morning and if you don't feel comfortable doing that, you can come to the office.  Please bring your stone fragments to the office for analysis.   This information is not intended to replace advice given to you by your health care provider. Make sure you discuss any questions you have with your health care provider. Document Released: 02/20/2013 Document Revised: 11/26/2015 Document Reviewed: 01/02/2015 Elsevier Interactive Patient Education  [employer].

## 2017-07-06 NOTE — Anesthesia Postprocedure Evaluation (Signed)
Anesthesia Post Note  Patient: Peter Manning  Procedure(s) Performed: CYSTOSCOPY WITH LEFT RETROGRADE PYELOGRAM, URETEROSCOPY WITH HOLMIUM LASER BASKET EXTRACTION AND STENT PLACEMENT (Left )     Patient location during evaluation: PACU Anesthesia Type: General Level of consciousness: awake, awake and alert and oriented Pain management: pain level controlled Vital Signs Assessment: post-procedure vital signs reviewed and stable Respiratory status: spontaneous breathing, nonlabored ventilation and respiratory function stable Cardiovascular status: blood pressure returned to baseline Anesthetic complications: no    Last Vitals:  Vitals:   07/06/17 1100 07/06/17 1229  BP: (!) 122/58 129/72  Pulse: (!) 58 (!) 57  Resp: 13 17  Temp:  36.9 C  SpO2: 91% 95%    Last Pain:  Vitals:   07/06/17 1016  TempSrc:   PainSc: Asleep                 Xzayvion Vaeth COKER

## 2017-07-06 NOTE — Op Note (Signed)
Procedure: 1.  Cystoscopy with left retrograde pyelogram and interpretation. 2.  Left ureteroscopic stone extraction with holmium lasertripsy and insertion of left double-J stent.  Preop diagnosis: Left distal ureteral stones.  Postop diagnosis: Ectopic left ureter with left distal ureteral stones.  Surgeon: Dr. [name].  Anesthesia: General.  Drain: 6 French by 26 cm contour double-J stent with tether.  Specimen: Stone fragments.  EBL: None.  Complications: None.  Indications: Peter Manning is a 63 year old African-American male who has 3 left distal ureteral stones and a chronically dilated distal ureteral segment.  The stones are radiolucent.  He is elected ureteroscopy for treatment.  Procedure: He was taken to the operating room where he was given Ancef.  A general anesthetic was induced.  He was placed in the lithotomy position and fitted with PAS hose.  His perineum and genitalia were prepped with Betadine solution and draped in usual sterile fashion.  Cystoscopy was performed using the 23 [place] scope and 30 and 70 degree lenses.  Examination revealed a normal urethra.  The external sphincter is intact.  The prostatic urethra was approximately 2-3 cm in length with bilobar hyperplasia with some obstruction.  Examination of bladder revealed normal mucosa with mild trabeculation.  The right ureteral orifice was in the normal anatomic position effluxing clear urine.  The left ureteral orifice was initially not seen but it eventually was located ectopically more proximal to the bladder neck on a small amount.  There was no ureterocele.  Once ureteral orifice was identified, which required the 70 degree lens.  And Alberans bridge was used to deflect a 5 [place] open end catheter into the orifice which was then cannulated with a sensor wire.  The ureteral catheter was advanced 2 cm and the wire was removed.  A right left retrograde pyelogram was performed with Omnipaque.  The left  retrograde pyelogram demonstrated an elongated intramural ureter with fusiform dilation of the distal 4 cm of the ureter with filling defects consistent with the 3 stones.  The proximal ureter and intrarenal collecting system are unremarkable.  A guidewire was then advanced to the kidney and a 15 French 4 cm high-pressure balloon was passed over the wire.  The intramural ureter was dilated to 18 atm of pressure.  Once the ureter had been dilated, the balloon was deflated and removed.  A 6.5 French semirigid ureteroscope was then passed alongside the wire.  3 stones were identified in the distal ureter with a yellowish appearance consistent with uric acid.  The largest stone was approximately 8 mm.  The smallest stone which was approximately 5-6 mm was removed using an engage basket.  The remaining 2 stones were fragmented with the holmium laser using a 365 m fiber with a maximum power of 1 W and frequency of 20 Hz.  Once the stones were fully fragmented, the fragments were removed using a combination of the engage basket and an encompass basket.  Final inspection revealed only dust in the ureteral lumen.  Ureteroscope was removed and the cystoscope was inserted alongside the wire.  The stone fragments were then evacuated from the bladder.  The cystoscope was then reinserted over the wire and a 6 French 26 cm contour double-J stent was inserted over the wire under fluoroscopic guidance. the wire was removed leaving a good coil of stent in the kidney and in the bladder.  The cystoscope was removed after draining the bladder and the stent string was left exiting the urethra.  The string was secured  to the patient's penis.  He was taken down from lithotomy position, his anesthetic was reversed and he was moved to recovery room in stable condition.  There were no complications.  The stone fragments were given to his family for him to bring to the office at follow-up.

## 2017-07-06 NOTE — Anesthesia Procedure Notes (Signed)
Procedure Name: LMA Insertion Date/Time: 07/06/2017 9:14 AM Performed by: Bonney Aid, CRNA Pre-anesthesia Checklist: Patient identified, Emergency Drugs available, Suction available and Patient being monitored Patient Re-evaluated:Patient Re-evaluated prior to induction Oxygen Delivery Method: Circle system utilized Preoxygenation: Pre-oxygenation with 100% oxygen Induction Type: IV induction Ventilation: Mask ventilation without difficulty LMA: LMA inserted LMA Size: 5.0 Number of attempts: 1 Airway Equipment and Method: Bite block Placement Confirmation: positive ETCO2 Tube secured with: Tape Dental Injury: Teeth and Oropharynx as per pre-operative assessment

## 2017-07-06 NOTE — Transfer of Care (Signed)
Immediate Anesthesia Transfer of Care Note  Patient: Peter Manning  Procedure(s) Performed: CYSTOSCOPY WITH LEFT RETROGRADE PYELOGRAM, URETEROSCOPY WITH HOLMIUM LASER BASKET EXTRACTION AND STENT PLACEMENT (Left )  Patient Location: PACU  Anesthesia Type:General  Level of Consciousness: drowsy and responds to stimulation  Airway & Oxygen Therapy: Patient Spontanous Breathing and Patient connected to nasal cannula oxygen  Post-op Assessment: Report given to RN  Post vital signs: Reviewed and stable  Last Vitals: 130/73, 60, 17, 94%, 98.4 Vitals:   07/06/17 0614  BP: 120/63  Pulse: (!) 59  Resp: 17  Temp: 36.6 C  SpO2: 99%    Last Pain:  Vitals:   07/06/17 0638  TempSrc:   PainSc: 0-No pain      Patients Stated Pain Goal: 7 (07/46/00 2984)  Complications: No apparent anesthesia complications

## 2017-07-06 NOTE — Anesthesia Preprocedure Evaluation (Signed)
Anesthesia Evaluation  Patient identified by MRN, date of birth, ID band Patient awake    Reviewed: Allergy & Precautions, NPO status , Patient's Chart, lab work & pertinent test results  History of Anesthesia Complications Negative for: history of anesthetic complications  Airway Mallampati: II  TM Distance: >3 FB Neck ROM: Full    Dental  (+) Missing, Dental Advisory Given   Pulmonary sleep apnea , Current Smoker,    breath sounds clear to auscultation       Cardiovascular hypertension, Pt. on medications  Rhythm:Regular     Neuro/Psych negative neurological ROS  negative psych ROS   GI/Hepatic negative GI ROS, (+) Hepatitis -  Endo/Other  Morbid obesity  Renal/GU Renal disease     Musculoskeletal  (+) Arthritis ,   Abdominal   Peds  Hematology   Anesthesia Other Findings   Reproductive/Obstetrics                             Anesthesia Physical  Anesthesia Plan  ASA: II  Anesthesia Plan: General   Post-op Pain Management:    Induction: Intravenous  PONV Risk Score and Plan: 2 and Ondansetron, Dexamethasone and Treatment may vary due to age or medical condition  Airway Management Planned: LMA  Additional Equipment: None  Intra-op Plan:   Post-operative Plan:   Informed Consent: I have reviewed the patients History and Physical, chart, labs and discussed the procedure including the risks, benefits and alternatives for the proposed anesthesia with the patient or authorized representative who has indicated his/her understanding and acceptance.   Dental advisory given  Plan Discussed with: CRNA, Surgeon and Anesthesiologist  Anesthesia Plan Comments:         Anesthesia Quick Evaluation

## 2017-07-06 NOTE — Interval H&P Note (Signed)
History and Physical Interval Note:  07/06/2017 9:05 AM  Peter Manning  has presented today for surgery, with the diagnosis of left ureteral stone  The various methods of treatment have been discussed with the patient and family. After consideration of risks, benefits and other options for treatment, the patient has consented to  Procedure(s): CYSTOSCOPY WITH LEFT RETROGRADE PYELOGRAM, URETEROSCOPY WITH HOLMIUM LASER BASKET EXTRACTION AND STENT PLACEMENT (Left) as a surgical intervention .  The patient's history has been reviewed, patient examined, no change in status, stable for surgery.  I have reviewed the patient's chart and labs.  Questions were answered to the patient's satisfaction.     [name]

## 2017-07-07 ENCOUNTER — Encounter (HOSPITAL_BASED_OUTPATIENT_CLINIC_OR_DEPARTMENT_OTHER): Payer: Self-pay | Admitting: Urology

## 2017-07-19 ENCOUNTER — Encounter (HOSPITAL_COMMUNITY): Payer: Self-pay

## 2017-07-19 ENCOUNTER — Other Ambulatory Visit: Payer: Self-pay

## 2017-07-19 ENCOUNTER — Emergency Department (HOSPITAL_COMMUNITY): Payer: Medicare Other

## 2017-07-19 ENCOUNTER — Emergency Department (HOSPITAL_COMMUNITY)
Admission: EM | Admit: 2017-07-19 | Discharge: 2017-07-19 | Disposition: A | Discharge status: Home / Self Care | Payer: Medicare Other | Attending: Emergency Medicine | Admitting: Emergency Medicine

## 2017-07-19 DIAGNOSIS — Z96651 Presence of right artificial knee joint: Secondary | ICD-10-CM | POA: Diagnosis not present

## 2017-07-19 DIAGNOSIS — I10 Essential (primary) hypertension: Secondary | ICD-10-CM | POA: Diagnosis not present

## 2017-07-19 DIAGNOSIS — F17210 Nicotine dependence, cigarettes, uncomplicated: Secondary | ICD-10-CM | POA: Insufficient documentation

## 2017-07-19 DIAGNOSIS — Z96642 Presence of left artificial hip joint: Secondary | ICD-10-CM | POA: Insufficient documentation

## 2017-07-19 DIAGNOSIS — R079 Chest pain, unspecified: Secondary | ICD-10-CM | POA: Diagnosis present

## 2017-07-19 DIAGNOSIS — Z7982 Long term (current) use of aspirin: Secondary | ICD-10-CM | POA: Insufficient documentation

## 2017-07-19 DIAGNOSIS — J40 Bronchitis, not specified as acute or chronic: Secondary | ICD-10-CM | POA: Diagnosis not present

## 2017-07-19 LAB — CBC
HCT: 40.1 % (ref 39.0–52.0)
Hemoglobin: 13.2 g/dL (ref 13.0–17.0)
MCH: 31.7 pg (ref 26.0–34.0)
MCHC: 32.9 g/dL (ref 30.0–36.0)
MCV: 96.4 fL (ref 78.0–100.0)
PLATELETS: 262 10*3/uL (ref 150–400)
RBC: 4.16 MIL/uL — ABNORMAL LOW (ref 4.22–5.81)
RDW: 12.8 % (ref 11.5–15.5)
WBC: 9.8 10*3/uL (ref 4.0–10.5)

## 2017-07-19 LAB — BASIC METABOLIC PANEL
Anion gap: 12 (ref 5–15)
BUN: 18 mg/dL (ref 6–20)
CALCIUM: 9.7 mg/dL (ref 8.9–10.3)
CO2: 21 mmol/L — AB (ref 22–32)
Chloride: 104 mmol/L (ref 101–111)
Creatinine, Ser: 0.95 mg/dL (ref 0.61–1.24)
GFR calc Af Amer: >60 mL/min (ref >60)
GFR calc non Af Amer: >60 mL/min (ref >60)
GLUCOSE: 116 mg/dL — AB (ref 65–99)
Potassium: 3.6 mmol/L (ref 3.5–5.1)
Sodium: 137 mmol/L (ref 135–145)

## 2017-07-19 LAB — I-STAT TROPONIN, ED: TROPONIN I, POC: 0.01 ng/mL (ref 0.00–0.08)

## 2017-07-19 MED ORDER — ALBUTEROL SULFATE HFA 108 (90 BASE) MCG/ACT IN AERS: 2.0000 | INHALATION_SPRAY | RESPIRATORY_TRACT | 0 refills | Status: AC | PRN

## 2017-07-19 MED ORDER — PREDNISONE 10 MG (21) PO TBPK: ORAL_TABLET | Freq: Every day | ORAL | 0 refills | Status: DC

## 2017-07-19 NOTE — ED Triage Notes (Signed)
Pt states that he began to have CP tonight with some SOB when coughing, has had a productive cough for the past two days with yellow sputum

## 2017-07-19 NOTE — ED Notes (Signed)
C/o chest pain onset several days ago , c/o chest congestion states he has been taking There-a flu without relief. Coughing yellow colored sputum. Chest pain is worse with inspiration and movement.

## 2017-07-19 NOTE — ED Provider Notes (Signed)
[facility] EMERGENCY DEPARTMENT Provider Note   CSN: [id number] Arrival date & time: 07/19/17  0547     History   Chief Complaint Chief Complaint  Patient presents with  . Chest Pain    HPI Peter Manning is a 62 y.o. male.  62 year old male presents with cough and congestion times 3 days.  Cough is been productive of yellow sputum but without fever or chills.  Has had sharp chest pain that only happens when he coughs and is not exertional.  No vomiting or diarrhea.  No lower extremity edema.  Has had some runny nose but denies any ear pain sore throat.  Notes positive sick exposures.  Has used over-the-counter medications without relief.  Nothing makes his symptoms better.      Past Medical History:  Diagnosis Date  . Bronchitis   . Hepatitis 1976   pt. doesn't remember if A, B, C?  . History of kidney stones   . Hyperlipidemia   . Hypertension   . OSA (obstructive sleep apnea) 12/09/2011   PSG 01/03/12>>AHI 45.7, SpO2 low 73%, PLMI 0.  CPAP 15 cm H2O>>AHI 0, +R.   Marland Kitchen Pneumonia 2015  . Sleep apnea    no CPAP ordered per patient    Patient Active Problem List   Diagnosis Date Noted  . History of lumbar fusion 05/03/2017  . Impingement syndrome of left shoulder 05/03/2017  . S/P total knee arthroplasty, right 12/28/2016  . Right knee DJD 10/10/2016  . 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 OSA (obstructive sleep apnea) 12/09/2011  . Chronic bronchitis ([place]) 12/09/2011  . Tobacco abuse 12/09/2011    Past Surgical History:  Procedure Laterality Date  . BACK SURGERY    . COLONOSCOPY    . CYSTOSCOPY WITH RETROGRADE PYELOGRAM, URETEROSCOPY AND STENT PLACEMENT Left 07/06/2017   Procedure: CYSTOSCOPY WITH LEFT RETROGRADE PYELOGRAM, URETEROSCOPY WITH HOLMIUM LASER BASKET EXTRACTION AND STENT PLACEMENT;  Surgeon: [name], MD;  Location: [facility];  Service: Urology;  Laterality: Left;  . JOINT REPLACEMENT    . REVISION TOTAL HIP  ARTHROPLASTY Left 11/2015  . TOTAL HIP ARTHROPLASTY    . TOTAL HIP REVISION Left 11/18/2015   Procedure: LEFT TOTAL HIP REVISION;  Surgeon: Marybelle Killings, MD;  Location: [place];  Service: Orthopedics;  Laterality: Left;  . TOTAL KNEE ARTHROPLASTY Right 10/10/2016   Procedure: TOTAL KNEE ARTHROPLASTY;  Surgeon: Marybelle Killings, MD;  Location: [place];  Service: Orthopedics;  Laterality: Right;       Home Medications    Prior to Admission medications   Medication Sig Start Date End Date Taking? Authorizing Provider  albuterol (PROVENTIL HFA;VENTOLIN HFA) 108 (90 BASE) MCG/ACT inhaler Inhale 1-2 puffs into the lungs every 6 (six) hours as needed for wheezing or shortness of breath. 04/17/14   Hyman Bible, PA-C  amLODipine (NORVASC) 10 MG tablet Take 10 mg by mouth daily.  08/24/16   [provider]  aspirin EC 325 MG EC tablet Take 1 tablet (325 mg total) by mouth daily with breakfast. 10/12/16   Lanae Crumbly, PA-C  benzonatate (TESSALON) 100 MG capsule Take 2 capsules (200 mg total) by mouth 2 (two) times daily as needed for cough. Patient not taking: Reported on 06/22/2017 12/31/16   Nona Dell, PA-C  fluticasone [facility]) 50 MCG/ACT nasal spray Place 1 spray into both nostrils daily. Patient not taking: Reported on 06/22/2017 12/31/16   Nona Dell, PA-C  ibuprofen (ADVIL,MOTRIN) 600 MG  tablet Take 1 tablet (600 mg total) by mouth every 6 (six) hours as needed. Patient not taking: Reported on 06/22/2017 12/31/16   Nona Dell, PA-C  methocarbamol (ROBAXIN) 500 MG tablet Take 1 tablet (500 mg total) by mouth every 6 (six) hours as needed for muscle spasms. Patient not taking: Reported on 06/22/2017 10/12/16   Lanae Crumbly, PA-C  naproxen (NAPROSYN) 375 MG tablet Take 1 tablet (375 mg total) by mouth 2 (two) times daily. Patient not taking: Reported on 06/22/2017 03/03/17   Etta Quill, NP  oxyCODONE-acetaminophen (PERCOCET) 10-325 MG tablet Take 1  tablet by mouth every 6 (six) hours as needed for pain. 07/06/17   [name], MD  phenazopyridine (PYRIDIUM) 200 MG tablet Take 1 tablet (200 mg total) by mouth 3 (three) times daily as needed for pain. 07/06/17   [name], MD  polyethylene glycol [facility] Floria Raveling) packet Take 17 g by mouth daily as needed for mild constipation. Patient not taking: Reported on 06/22/2017 10/12/16   Lanae Crumbly, PA-C  potassium chloride SA (K-DUR,KLOR-CON) 20 MEQ tablet Take 1 tablet (20 mEq total) by mouth daily. 12/04/16   Julianne Rice, MD    Family History Family History  Problem Relation Age of Onset  . Heart attack Father   . Cancer Mother     Social History Social History   Tobacco Use  . Smoking status: Current Some Day Smoker    Packs/day: 0.50    Years: 46.00    Pack years: 23.00    Types: Cigarettes  . Smokeless tobacco: Never Used  . Tobacco comment: states on and off smoker  Substance Use Topics  . Alcohol use: No  . Drug use: No     Allergies   Patient has no known allergies.   Review of Systems Review of Systems  All other systems reviewed and are negative.    Physical Exam Updated Vital Signs BP 137/62   Pulse 67   Temp 97.8 F (36.6 C) (Oral)   Resp 18   SpO2 94%   Physical Exam  Constitutional: He is oriented to person, place, and time. He appears well-developed and well-nourished.  Non-toxic appearance. No distress.  HENT:  Head: Normocephalic and atraumatic.  Eyes: Conjunctivae, EOM and lids are normal. Pupils are equal, round, and reactive to light.  Neck: Normal range of motion. Neck supple. No tracheal deviation present. No thyroid mass present.  Cardiovascular: Normal rate, regular rhythm and normal heart sounds. Exam reveals no gallop.  No murmur heard. Pulmonary/Chest: Effort normal. No stridor. No respiratory distress. He has decreased breath sounds in the right lower field and the left lower field. He has no wheezes. He has no rhonchi. He has  no rales.  Abdominal: Soft. Normal appearance and bowel sounds are normal. He exhibits no distension. There is no tenderness. There is no rebound and no CVA tenderness.  Musculoskeletal: Normal range of motion. He exhibits no edema or tenderness.  Neurological: He is alert and oriented to person, place, and time. He has normal strength. No cranial nerve deficit or sensory deficit. GCS eye subscore is 4. GCS verbal subscore is 5. GCS motor subscore is 6.  Skin: Skin is warm and dry. No abrasion and no rash noted.  Psychiatric: He has a normal mood and affect. His speech is normal and behavior is normal.  Nursing note and vitals reviewed.    ED Treatments / Results  Labs (all labs ordered are listed, but only abnormal results  are displayed) Labs Reviewed  BASIC METABOLIC PANEL - Abnormal; Notable for the following components:      Result Value   CO2 21 (*)    Glucose, Bld 116 (*)    All other components within normal limits  CBC - Abnormal; Notable for the following components:   RBC 4.16 (*)    All other components within normal limits  I-STAT TROPONIN, ED    EKG  EKG Interpretation  Date/Time:  Wednesday July 19 2017 05:53:52 EST Ventricular Rate:  68 PR Interval:  146 QRS Duration: 102 QT Interval:  398 QTC Calculation: 423 R Axis:   61 Text Interpretation:  Normal sinus rhythm Normal ECG When compared with ECG of 06/22/2017, Nonspecific T wave abnormality is no longer present Confirmed by Delora Fuel ([id number]) on 07/19/2017 6:28:50 AM       Radiology Dg Chest 2 View  Result Date: 07/19/2017 CLINICAL DATA:  62 year old male with chest pain. EXAM: CHEST  2 VIEW COMPARISON:  Chest radiograph dated 06/22/2017 FINDINGS: The lungs are clear. There is no pleural effusion or pneumothorax. The cardiac silhouette is within normal limits. There is degenerative changes of the spine. No acute osseous pathology. IMPRESSION: No active cardiopulmonary disease. Electronically Signed    By: Anner Crete M.D.   On: 07/19/2017 07:01    Procedures Procedures (including critical care time)  Medications Ordered in ED Medications - No data to display   Initial Impression / Assessment and Plan / ED Course  I have reviewed the triage vital signs and the nursing notes.  Pertinent labs & imaging results that were available during my care of the patient were reviewed by me and considered in my medical decision making (see chart for details).     Patient symptoms are noncardiac in nature.  Suspect URI.  Chest x-ray without acute findings.  Troponin negative.  Will treat for suspected bronchitis  Final Clinical Impressions(s) / ED Diagnoses   Final diagnoses:  None    ED Discharge Orders    None       Lacretia Leigh, MD 07/19/17 [phone]

## 2017-08-01 ENCOUNTER — Encounter (HOSPITAL_COMMUNITY): Payer: Self-pay | Admitting: *Deleted

## 2017-08-01 ENCOUNTER — Emergency Department (HOSPITAL_COMMUNITY): Payer: Medicare Other

## 2017-08-01 ENCOUNTER — Emergency Department (HOSPITAL_COMMUNITY)
Admission: EM | Admit: 2017-08-01 | Discharge: 2017-08-01 | Disposition: A | Discharge status: Home / Self Care | Payer: Medicare Other | Attending: Emergency Medicine | Admitting: Emergency Medicine

## 2017-08-01 DIAGNOSIS — J189 Pneumonia, unspecified organism: Secondary | ICD-10-CM | POA: Diagnosis not present

## 2017-08-01 DIAGNOSIS — R109 Unspecified abdominal pain: Secondary | ICD-10-CM | POA: Diagnosis present

## 2017-08-01 DIAGNOSIS — I10 Essential (primary) hypertension: Secondary | ICD-10-CM | POA: Diagnosis not present

## 2017-08-01 DIAGNOSIS — Z7982 Long term (current) use of aspirin: Secondary | ICD-10-CM | POA: Insufficient documentation

## 2017-08-01 DIAGNOSIS — F17210 Nicotine dependence, cigarettes, uncomplicated: Secondary | ICD-10-CM | POA: Insufficient documentation

## 2017-08-01 DIAGNOSIS — Z79899 Other long term (current) drug therapy: Secondary | ICD-10-CM | POA: Diagnosis not present

## 2017-08-01 DIAGNOSIS — Z96642 Presence of left artificial hip joint: Secondary | ICD-10-CM | POA: Insufficient documentation

## 2017-08-01 DIAGNOSIS — Z96651 Presence of right artificial knee joint: Secondary | ICD-10-CM | POA: Insufficient documentation

## 2017-08-01 HISTORY — DX: Disorder of kidney and ureter, unspecified: N28.9

## 2017-08-01 LAB — URINALYSIS, ROUTINE W REFLEX MICROSCOPIC
Bacteria, UA: NONE SEEN
Bilirubin Urine: NEGATIVE
GLUCOSE, UA: NEGATIVE mg/dL
KETONES UR: NEGATIVE mg/dL
Leukocytes, UA: NEGATIVE
Nitrite: NEGATIVE
PH: 5 (ref 5.0–8.0)
Protein, ur: NEGATIVE mg/dL
Specific Gravity, Urine: 1.021 (ref 1.005–1.030)

## 2017-08-01 LAB — COMPREHENSIVE METABOLIC PANEL
ALT: 32 U/L (ref 17–63)
AST: 32 U/L (ref 15–41)
Albumin: 3.8 g/dL (ref 3.5–5.0)
Alkaline Phosphatase: 105 U/L (ref 38–126)
Anion gap: 12 (ref 5–15)
BUN: 12 mg/dL (ref 6–20)
CALCIUM: 9.7 mg/dL (ref 8.9–10.3)
CHLORIDE: 102 mmol/L (ref 101–111)
CO2: 24 mmol/L (ref 22–32)
CREATININE: 1.03 mg/dL (ref 0.61–1.24)
GFR calc non Af Amer: >60 mL/min (ref >60)
GLUCOSE: 182 mg/dL — AB (ref 65–99)
Potassium: 3.7 mmol/L (ref 3.5–5.1)
SODIUM: 138 mmol/L (ref 135–145)
Total Bilirubin: 0.8 mg/dL (ref 0.3–1.2)
Total Protein: 7.6 g/dL (ref 6.5–8.1)

## 2017-08-01 LAB — CBC
HCT: 42.4 % (ref 39.0–52.0)
Hemoglobin: 14.2 g/dL (ref 13.0–17.0)
MCH: 32.9 pg (ref 26.0–34.0)
MCHC: 33.5 g/dL (ref 30.0–36.0)
MCV: 98.1 fL (ref 78.0–100.0)
PLATELETS: 279 10*3/uL (ref 150–400)
RBC: 4.32 MIL/uL (ref 4.22–5.81)
RDW: 13.3 % (ref 11.5–15.5)
WBC: 9.4 10*3/uL (ref 4.0–10.5)

## 2017-08-01 MED ORDER — OXYCODONE-ACETAMINOPHEN 5-325 MG PO TABS
2.0000 | ORAL_TABLET | Freq: Once | ORAL | Status: AC
  Administered 2017-08-01: 2 via ORAL
  Filled 2017-08-01: qty 2

## 2017-08-01 MED ORDER — AZITHROMYCIN 250 MG PO TABS: 250.0000 mg | ORAL_TABLET | Freq: Every day | ORAL | 0 refills | Status: DC

## 2017-08-01 MED ORDER — AZITHROMYCIN 250 MG PO TABS
500.0000 mg | ORAL_TABLET | Freq: Once | ORAL | Status: AC
  Administered 2017-08-01: 500 mg via ORAL
  Filled 2017-08-01: qty 2

## 2017-08-01 MED ORDER — CEFTRIAXONE SODIUM 1 G IJ SOLR
1.0000 g | Freq: Once | INTRAMUSCULAR | Status: AC
  Administered 2017-08-01: 1 g via INTRAVENOUS
  Filled 2017-08-01: qty 10

## 2017-08-01 NOTE — ED Provider Notes (Signed)
[facility] EMERGENCY DEPARTMENT Provider Note   CSN: [id number] Arrival date & time: 08/01/17  1111     History   Chief Complaint Chief Complaint  Patient presents with  . Flank Pain    HPI Peter Manning is a 61 y.o. male.  The history is provided by the patient. No language interpreter was used.  Flank Pain  This is a new problem. The problem occurs constantly. The problem has been gradually worsening. Nothing aggravates the symptoms. Nothing relieves the symptoms. He has tried nothing for the symptoms. The treatment provided no relief.  Pt complains of a cough and congestion.  Pt reports he has been coughing up colored phlegm.  he reports he has pain in the left side of his back in the same area that he had pain from kidney stones in the past.  Patient reports he is scheduled to see the urologist next month.  He had a stent placed in November 2018.  Past Medical History:  Diagnosis Date  . Bronchitis   . Hepatitis 1976   pt. doesn't remember if A, B, C?  . History of kidney stones   . Hyperlipidemia   . Hypertension   . OSA (obstructive sleep apnea) 12/09/2011   PSG 01/03/12>>AHI 45.7, SpO2 low 73%, PLMI 0.  CPAP 15 cm H2O>>AHI 0, +R.   Marland Kitchen Pneumonia 2015  . Renal disorder   . Sleep apnea    no CPAP ordered per patient    Patient Active Problem List   Diagnosis Date Noted  . History of lumbar fusion 05/03/2017  . Impingement syndrome of left shoulder 05/03/2017  . S/P total knee arthroplasty, right 12/28/2016  . Right knee DJD 10/10/2016  . 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 OSA (obstructive sleep apnea) 12/09/2011  . Chronic bronchitis ([place]) 12/09/2011  . Tobacco abuse 12/09/2011    Past Surgical History:  Procedure Laterality Date  . BACK SURGERY    . COLONOSCOPY    . CYSTOSCOPY WITH RETROGRADE PYELOGRAM, URETEROSCOPY AND STENT PLACEMENT Left 07/06/2017   Procedure: CYSTOSCOPY WITH LEFT RETROGRADE PYELOGRAM, URETEROSCOPY WITH HOLMIUM  LASER BASKET EXTRACTION AND STENT PLACEMENT;  Surgeon: [name], MD;  Location: [facility];  Service: Urology;  Laterality: Left;  . JOINT REPLACEMENT    . REVISION TOTAL HIP ARTHROPLASTY Left 11/2015  . TOTAL HIP ARTHROPLASTY    . TOTAL HIP REVISION Left 11/18/2015   Procedure: LEFT TOTAL HIP REVISION;  Surgeon: Marybelle Killings, MD;  Location: [facility];  Service: Orthopedics;  Laterality: Left;  . TOTAL KNEE ARTHROPLASTY Right 10/10/2016   Procedure: TOTAL KNEE ARTHROPLASTY;  Surgeon: Marybelle Killings, MD;  Location: [place];  Service: Orthopedics;  Laterality: Right;       Home Medications    Prior to Admission medications   Medication Sig Start Date End Date Taking? Authorizing Provider  albuterol (PROVENTIL HFA;VENTOLIN HFA) 108 (90 BASE) MCG/ACT inhaler Inhale 1-2 puffs into the lungs every 6 (six) hours as needed for wheezing or shortness of breath. 04/17/14   Hyman Bible, PA-C  albuterol (PROVENTIL HFA;VENTOLIN HFA) 108 (90 Base) MCG/ACT inhaler Inhale 2 puffs into the lungs every 4 (four) hours as needed for wheezing or shortness of breath. 07/19/17   Lacretia Leigh, MD  amLODipine (NORVASC) 10 MG tablet Take 10 mg by mouth daily.  08/24/16   [provider]  aspirin EC 325 MG EC tablet Take 1 tablet (325 mg total) by mouth daily with breakfast. 10/12/16  Lanae Crumbly, PA-C  benzonatate (TESSALON) 100 MG capsule Take 2 capsules (200 mg total) by mouth 2 (two) times daily as needed for cough. Patient not taking: Reported on 06/22/2017 12/31/16   Nona Dell, PA-C  fluticasone [facility]) 50 MCG/ACT nasal spray Place 1 spray into both nostrils daily. Patient not taking: Reported on 06/22/2017 12/31/16   Nona Dell, PA-C  ibuprofen (ADVIL,MOTRIN) 600 MG tablet Take 1 tablet (600 mg total) by mouth every 6 (six) hours as needed. Patient not taking: Reported on 06/22/2017 12/31/16   Nona Dell, PA-C  methocarbamol (ROBAXIN) 500 MG  tablet Take 1 tablet (500 mg total) by mouth every 6 (six) hours as needed for muscle spasms. Patient not taking: Reported on 06/22/2017 10/12/16   Lanae Crumbly, PA-C  naproxen (NAPROSYN) 375 MG tablet Take 1 tablet (375 mg total) by mouth 2 (two) times daily. Patient not taking: Reported on 06/22/2017 03/03/17   Etta Quill, NP  oxyCODONE-acetaminophen (PERCOCET) 10-325 MG tablet Take 1 tablet by mouth every 6 (six) hours as needed for pain. 07/06/17   [name], MD  phenazopyridine (PYRIDIUM) 200 MG tablet Take 1 tablet (200 mg total) by mouth 3 (three) times daily as needed for pain. 07/06/17   [name], MD  polyethylene glycol [facility] / Floria Raveling) packet Take 17 g by mouth daily as needed for mild constipation. Patient not taking: Reported on 06/22/2017 10/12/16   Lanae Crumbly, PA-C  potassium chloride SA (K-DUR,KLOR-CON) 20 MEQ tablet Take 1 tablet (20 mEq total) by mouth daily. 12/04/16   Julianne Rice, MD  predniSONE (STERAPRED UNI-PAK 21 TAB) 10 MG (21) TBPK tablet Take by mouth daily. Take 6 tabs by mouth daily  for 2 days, then 5 tabs for 2 days, then 4 tabs for 2 days, then 3 tabs for 2 days, 2 tabs for 2 days, then 1 tab by mouth daily for 2 days 07/19/17   Lacretia Leigh, MD    Family History Family History  Problem Relation Age of Onset  . Heart attack Father   . Cancer Mother     Social History Social History   Tobacco Use  . Smoking status: Current Some Day Smoker    Packs/day: 0.50    Years: 46.00    Pack years: 23.00    Types: Cigarettes  . Smokeless tobacco: Never Used  . Tobacco comment: states on and off smoker  Substance Use Topics  . Alcohol use: No  . Drug use: No     Allergies   Patient has no known allergies.   Review of Systems Review of Systems  Genitourinary: Positive for flank pain.  All other systems reviewed and are negative.    Physical Exam Updated Vital Signs BP 135/76 (BP Location: Right Arm)   Pulse 65   Temp 97.7 F (36.5 C)  (Oral)   Resp 14   SpO2 96%   Physical Exam  Constitutional: He appears well-developed and well-nourished.  HENT:  Head: Normocephalic and atraumatic.  Nose: Nose normal.  Mouth/Throat: Oropharynx is clear and moist.  Eyes: Conjunctivae are normal.  Neck: Neck supple.  Cardiovascular: Normal rate and regular rhythm.  No murmur heard. Pulmonary/Chest: Effort normal and breath sounds normal. No respiratory distress.  Abdominal: Soft. There is no tenderness.  Left flank left CVA tenderness  Musculoskeletal: He exhibits no edema.  Neurological: He is alert.  Skin: Skin is warm and dry.  Psychiatric: He has a normal mood and affect.  Nursing note  and vitals reviewed.    ED Treatments / Results  Labs (all labs ordered are listed, but only abnormal results are displayed) Labs Reviewed  COMPREHENSIVE METABOLIC PANEL - Abnormal; Notable for the following components:      Result Value   Glucose, Bld 182 (*)    All other components within normal limits  URINALYSIS, ROUTINE W REFLEX MICROSCOPIC - Abnormal; Notable for the following components:   Hgb urine dipstick MODERATE (*)    Squamous Epithelial / LPF 0-5 (*)    All other components within normal limits  CBC    EKG  EKG Interpretation None       Radiology Dg Chest 2 View  Result Date: 08/01/2017 CLINICAL DATA:  62 year old male with a history of congestion and productive cough EXAM: CHEST  2 VIEW COMPARISON:  07/19/2017 FINDINGS: Cardiomediastinal silhouette unchanged in size and contour. No confluent airspace disease, pleural effusion, pneumothorax. No interlobular septal thickening. No displaced fracture.  Degenerative changes of the spine. IMPRESSION: No radiographic evidence of acute cardiopulmonary disease Electronically Signed   By: Corrie Mckusick D.O.   On: 08/01/2017 12:07   Ct Renal Stone Study  Result Date: 08/01/2017 CLINICAL DATA:  Left flank pain for 2 weeks. EXAM: CT ABDOMEN AND PELVIS WITHOUT CONTRAST  TECHNIQUE: Multidetector CT imaging of the abdomen and pelvis was performed following the standard protocol without IV contrast. COMPARISON:  CT scan 06/22/2017 FINDINGS: Lower chest: Small nodular density at the left lung base measures 7 mm on image 14. However, this was not present on the recent CT scan from 06/22/2017 and is unlikely a pulmonary nodule and much more likely a focal area of atelectasis or inflammation. A few other smaller similar areas are noted also. The heart is normal in size.  No pericardial effusion. Hepatobiliary: No focal hepatic lesions or intrahepatic biliary dilatation. The gallbladder is contracted. Could not exclude gallstones. Ultrasound may be helpful for further evaluation. Normal caliber common bile duct. Pancreas: No mass, inflammation or ductal dilatation. Spleen: Normal size. No focal lesions. Numerous small splenules are noted in the left upper quadrant. Adrenals/Urinary Tract: The adrenal glands are normal. Multiple bilateral renal cysts are again demonstrated. No worrisome renal lesions. No renal calculi, ureteral calculi or bladder calculi. There is a stable calcification in the bladder wall at the dome. No change since 2017. Stomach/Bowel: The stomach, duodenum, small bowel and colon are grossly normal without oral contrast. No inflammatory changes, mass lesions or obstructive findings. The appendix is normal. Vascular/Lymphatic: Stable atherosclerotic calcifications involving the aorta and iliac arteries. No mesenteric or retroperitoneal mass or adenopathy. Reproductive: The prostate gland and seminal vesicles are grossly normal. Limited evaluation due to significant artifact from the left hip prosthesis. Other: No pelvic mass or adenopathy. No free pelvic fluid collections. No inguinal mass or adenopathy. Small periumbilical abdominal wall hernia containing fat. Musculoskeletal: Stable surgical changes involving the lumbar spine. No acute or significant bony findings.  Bilateral fused SI joints are noted. IMPRESSION: 1. No renal, ureteral or bladder calculi or mass. Stable bilateral renal cysts. 2. No acute abdominal/pelvic findings, mass lesions or adenopathy. 3. Contracted gallbladder.  Possible gallstones. Electronically Signed   By: Marijo Sanes M.D.   On: 08/01/2017 13:56    Procedures Procedures (including critical care time)  Medications Ordered in ED Medications  oxyCODONE-acetaminophen (PERCOCET/ROXICET) 5-325 MG per tablet 2 tablet (2 tablets Oral Given 08/01/17 1336)     Initial Impression / Assessment and Plan / ED Course  I have reviewed the  triage vital signs and the nursing notes.  Pertinent labs & imaging results that were available during my care of the patient were reviewed by me and considered in my medical decision making (see chart for details).     MDM patient had a chest x-ray which is read by the radiologist as negative for infiltrate however renal CT shows an area of base of left lung that appears to be an infiltrate.  CT does not show any evidence of kidney stones.  Patient had not taking his pain medicine today he is in pain management and takes Percocet daily.  He was given 2 Percocet here.  I will treat him with Rocephin and Zithromax for possible community-acquired pneumonia the radiologist has recommended follow-up evaluation he is advised to see his physician Dr. Jeanie Cooks for recheck and is advised that he does need follow-up x-ray  Final Clinical Impressions(s) / ED Diagnoses   Final diagnoses:  Community acquired pneumonia of left lung, unspecified part of lung    ED Discharge Orders        Ordered    azithromycin (ZITHROMAX) 250 MG tablet  Daily     08/01/17 1409    An After Visit Summary was printed and given to the patient.   Fransico Meadow, PA-C 08/01/17 [place], [place] 08/01/17 1413    Gareth Morgan, MD 08/01/17 2234

## 2017-08-01 NOTE — ED Triage Notes (Signed)
To ED for eval of left flank pain. States he feels the same as he did prior to having stones removed last year. States he has noticed that he is also congested with productive cough. Intermittent fevers noted. No pain with urination

## 2017-08-01 NOTE — Discharge Instructions (Signed)
Return if any problems.

## 2017-09-29 ENCOUNTER — Ambulatory Visit (INDEPENDENT_AMBULATORY_CARE_PROVIDER_SITE_OTHER): Payer: Medicare Other | Admitting: Orthopaedic Surgery

## 2017-10-17 ENCOUNTER — Other Ambulatory Visit: Payer: Self-pay

## 2017-10-17 ENCOUNTER — Emergency Department (HOSPITAL_COMMUNITY)
Admission: EM | Admit: 2017-10-17 | Discharge: 2017-10-17 | Disposition: A | Discharge status: Home / Self Care | Payer: Medicare Other | Attending: Nurse Practitioner | Admitting: Nurse Practitioner

## 2017-10-17 ENCOUNTER — Encounter (HOSPITAL_COMMUNITY): Payer: Self-pay | Admitting: Emergency Medicine

## 2017-10-17 ENCOUNTER — Emergency Department (HOSPITAL_COMMUNITY): Payer: Medicare Other

## 2017-10-17 DIAGNOSIS — F17210 Nicotine dependence, cigarettes, uncomplicated: Secondary | ICD-10-CM | POA: Insufficient documentation

## 2017-10-17 DIAGNOSIS — Z7982 Long term (current) use of aspirin: Secondary | ICD-10-CM | POA: Insufficient documentation

## 2017-10-17 DIAGNOSIS — J181 Lobar pneumonia, unspecified organism: Secondary | ICD-10-CM | POA: Diagnosis not present

## 2017-10-17 DIAGNOSIS — Z96651 Presence of right artificial knee joint: Secondary | ICD-10-CM | POA: Insufficient documentation

## 2017-10-17 DIAGNOSIS — I10 Essential (primary) hypertension: Secondary | ICD-10-CM | POA: Insufficient documentation

## 2017-10-17 DIAGNOSIS — R05 Cough: Secondary | ICD-10-CM | POA: Diagnosis present

## 2017-10-17 DIAGNOSIS — J189 Pneumonia, unspecified organism: Secondary | ICD-10-CM

## 2017-10-17 DIAGNOSIS — Z96642 Presence of left artificial hip joint: Secondary | ICD-10-CM | POA: Insufficient documentation

## 2017-10-17 DIAGNOSIS — Z79899 Other long term (current) drug therapy: Secondary | ICD-10-CM | POA: Diagnosis not present

## 2017-10-17 MED ORDER — DOXYCYCLINE HYCLATE 100 MG PO CAPS: 100.0000 mg | ORAL_CAPSULE | Freq: Two times a day (BID) | ORAL | 0 refills | Status: DC

## 2017-10-17 NOTE — ED Triage Notes (Signed)
Pt reports cough, back pain, and mucus production x 3 days. States that today he also developed cold chills .

## 2017-10-17 NOTE — ED Provider Notes (Signed)
[facility] EMERGENCY DEPARTMENT Provider Note   CSN: [id number] Arrival date & time: 10/17/17  2018     History   Chief Complaint Chief Complaint  Patient presents with  . URI    HPI Akito Boomhower is a 62 y.o. male.  Patient complaining of cough, chills, back pain onset three days ago. Was treated for CAP at end of January 2019. Did not follow-up to ensure resolution.  The history is provided by the patient. No language interpreter was used.  URI   This is a new problem. The current episode started more than 2 days ago. The problem has been gradually worsening. Associated symptoms include congestion and cough.    Past Medical History:  Diagnosis Date  . Bronchitis   . Hepatitis 1976   pt. doesn't remember if A, B, C?  . History of kidney stones   . Hyperlipidemia   . Hypertension   . OSA (obstructive sleep apnea) 12/09/2011   PSG 01/03/12>>AHI 45.7, SpO2 low 73%, PLMI 0.  CPAP 15 cm H2O>>AHI 0, +R.   Marland Kitchen Pneumonia 2015  . Renal disorder   . Sleep apnea    no CPAP ordered per patient    Patient Active Problem List   Diagnosis Date Noted  . History of lumbar fusion 05/03/2017  . Impingement syndrome of left shoulder 05/03/2017  . S/P total knee arthroplasty, right 12/28/2016  . Right knee DJD 10/10/2016  . 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 OSA (obstructive sleep apnea) 12/09/2011  . Chronic bronchitis ([place]) 12/09/2011  . Tobacco abuse 12/09/2011    Past Surgical History:  Procedure Laterality Date  . BACK SURGERY    . COLONOSCOPY    . CYSTOSCOPY WITH RETROGRADE PYELOGRAM, URETEROSCOPY AND STENT PLACEMENT Left 07/06/2017   Procedure: CYSTOSCOPY WITH LEFT RETROGRADE PYELOGRAM, URETEROSCOPY WITH HOLMIUM LASER BASKET EXTRACTION AND STENT PLACEMENT;  Surgeon: [name], MD;  Location[facility];  Service: Urology;  Laterality: Left;  . JOINT REPLACEMENT    . REVISION TOTAL HIP ARTHROPLASTY Left 11/2015  . TOTAL HIP  ARTHROPLASTY    . TOTAL HIP REVISION Left 11/18/2015   Procedure: LEFT TOTAL HIP REVISION;  Surgeon: Marybelle Killings, MD;  Location: [place];  Service: Orthopedics;  Laterality: Left;  . TOTAL KNEE ARTHROPLASTY Right 10/10/2016   Procedure: TOTAL KNEE ARTHROPLASTY;  Surgeon: Marybelle Killings, MD;  Location: [facility];  Service: Orthopedics;  Laterality: Right;        Home Medications    Prior to Admission medications   Medication Sig Start Date End Date Taking? Authorizing Provider  albuterol (PROVENTIL HFA;VENTOLIN HFA) 108 (90 BASE) MCG/ACT inhaler Inhale 1-2 puffs into the lungs every 6 (six) hours as needed for wheezing or shortness of breath. 04/17/14   Hyman Bible, PA-C  albuterol (PROVENTIL HFA;VENTOLIN HFA) 108 (90 Base) MCG/ACT inhaler Inhale 2 puffs into the lungs every 4 (four) hours as needed for wheezing or shortness of breath. 07/19/17   Lacretia Leigh, MD  amLODipine (NORVASC) 10 MG tablet Take 10 mg by mouth daily.  08/24/16   [provider]  aspirin EC 325 MG EC tablet Take 1 tablet (325 mg total) by mouth daily with breakfast. 10/12/16   Lanae Crumbly, PA-C  azithromycin (ZITHROMAX) 250 MG tablet Take 1 tablet (250 mg total) by mouth daily. Take first 2 tablets together, then 1 every day until finished. 08/01/17   Fransico Meadow, PA-C  oxyCODONE-acetaminophen (PERCOCET) 10-325 MG tablet Take 1  tablet by mouth every 6 (six) hours as needed for pain. 07/06/17   [name], MD  phenazopyridine (PYRIDIUM) 200 MG tablet Take 1 tablet (200 mg total) by mouth 3 (three) times daily as needed for pain. 07/06/17   [name], MD  polyethylene glycol [facility] / Floria Raveling) packet Take 17 g by mouth daily as needed for mild constipation. Patient not taking: Reported on 06/22/2017 10/12/16   Lanae Crumbly, PA-C  potassium chloride SA (K-DUR,KLOR-CON) 20 MEQ tablet Take 1 tablet (20 mEq total) by mouth daily. 12/04/16   Julianne Rice, MD  predniSONE (STERAPRED UNI-PAK 21 TAB) 10 MG (21) TBPK  tablet Take by mouth daily. Take 6 tabs by mouth daily  for 2 days, then 5 tabs for 2 days, then 4 tabs for 2 days, then 3 tabs for 2 days, 2 tabs for 2 days, then 1 tab by mouth daily for 2 days 07/19/17   Lacretia Leigh, MD    Family History Family History  Problem Relation Age of Onset  . Heart attack Father   . Cancer Mother     Social History Social History   Tobacco Use  . Smoking status: Current Some Day Smoker    Packs/day: 0.50    Years: 46.00    Pack years: 23.00    Types: Cigarettes  . Smokeless tobacco: Never Used  . Tobacco comment: states on and off smoker  Substance Use Topics  . Alcohol use: No  . Drug use: No     Allergies   Patient has no known allergies.   Review of Systems Review of Systems  HENT: Positive for congestion.   Respiratory: Positive for cough.   Musculoskeletal: Positive for back pain.  All other systems reviewed and are negative.    Physical Exam Updated Vital Signs BP 131/84 (BP Location: Left Arm)   Pulse (!) 101   Temp 99 F (37.2 C) (Oral)   Resp 20   Ht 5\' 8"  (1.727 m)   Wt 113.9 kg (251 lb)   SpO2 97%   BMI 38.16 kg/m   Physical Exam  Constitutional: He is oriented to person, place, and time. He appears well-developed and well-nourished.  HENT:  Head: Normocephalic.  Eyes: Conjunctivae are normal.  Neck: Neck supple.  Cardiovascular: Normal rate and regular rhythm.  Pulmonary/Chest: Effort normal. He has wheezes in the left lower field. He has rhonchi in the left lower field.     He exhibits tenderness.  Abdominal: Soft. Bowel sounds are normal.  Musculoskeletal: Normal range of motion. He exhibits no edema.  Lymphadenopathy:    He has no cervical adenopathy.  Neurological: He is alert and oriented to person, place, and time.  Skin: Skin is warm and dry.  Psychiatric: He has a normal mood and affect.  Nursing note and vitals reviewed.    ED Treatments / Results  Labs (all labs ordered are listed, but  only abnormal results are displayed) Labs Reviewed - No data to display  EKG None  Radiology Dg Chest 2 View  Result Date: 10/17/2017 CLINICAL DATA:  Cough and chills EXAM: CHEST - 2 VIEW COMPARISON:  August 01, 2017 FINDINGS: There is patchy opacity in the posterior left base concerning for in area of pneumonia. Lungs elsewhere clear. Heart size and pulmonary vascularity are normal. No adenopathy. There is degenerative change in thoracic spine. IMPRESSION: Patchy opacity felt to represent pneumonia posterior left base. Lungs elsewhere clear. No adenopathy. Followup PA and lateral chest radiographs recommended in 3-4  weeks following trial of antibiotic therapy to ensure resolution and exclude underlying malignancy. Electronically Signed   By: Lowella Grip III M.D.   On: 10/17/2017 21:37    Procedures Procedures (including critical care time)  Medications Ordered in ED Medications - No data to display   Initial Impression / Assessment and Plan / ED Course  I have reviewed the triage vital signs and the nursing notes.  Pertinent labs & imaging results that were available during my care of the patient were reviewed by me and considered in my medical decision making (see chart for details).    Patient discussed with Dr. Benjaman Kindler. Patient was treated for CAP in January 2019 with rocephin and azithromycin. Patient did not follow-up to ensure resolution.   Patient has been diagnosed with CAP via chest xray. Pt is not ill appearing, not immunocompromised, and does not have multiple comorbidities. Pt appears appropriate for OP treatment with abx therapy. Will use doxycycline.. Pt has been advised to return to the ED if symptoms worsen, they become SOB or they do not improve. Pt verbalizes understanding and is agreeable with plan.   Final Clinical Impressions(s) / ED Diagnoses   Final diagnoses:  Community acquired pneumonia of left lower lobe of lung [facility])    ED Discharge Orders         Ordered    doxycycline (VIBRAMYCIN) 100 MG capsule  2 times daily     10/17/17 2301       Etta Quill, NP 10/18/17 9244    Valarie Merino, MD 10/18/17 1259

## 2017-10-17 NOTE — Discharge Instructions (Signed)
Take the antibiotic as directed for the full seven days. Follow-up with your primary care provider. Repeat chest xray is recommended 3-4 weeks after completion of the antibiotics.

## 2017-10-25 ENCOUNTER — Encounter (INDEPENDENT_AMBULATORY_CARE_PROVIDER_SITE_OTHER): Payer: Self-pay | Admitting: Orthopaedic Surgery

## 2017-10-25 ENCOUNTER — Ambulatory Visit (INDEPENDENT_AMBULATORY_CARE_PROVIDER_SITE_OTHER): Payer: Medicare Other

## 2017-10-25 ENCOUNTER — Ambulatory Visit (INDEPENDENT_AMBULATORY_CARE_PROVIDER_SITE_OTHER): Payer: Medicare Other | Admitting: Orthopaedic Surgery

## 2017-10-25 VITALS — BP 113/67 | HR 66 | Ht 68.0 in | Wt 254.0 lb

## 2017-10-25 DIAGNOSIS — M7542 Impingement syndrome of left shoulder: Secondary | ICD-10-CM

## 2017-10-25 DIAGNOSIS — M25552 Pain in left hip: Secondary | ICD-10-CM

## 2017-10-25 DIAGNOSIS — M5442 Lumbago with sciatica, left side: Secondary | ICD-10-CM | POA: Diagnosis not present

## 2017-10-25 DIAGNOSIS — G8929 Other chronic pain: Secondary | ICD-10-CM

## 2017-10-25 DIAGNOSIS — Z96649 Presence of unspecified artificial hip joint: Secondary | ICD-10-CM

## 2017-10-25 NOTE — Progress Notes (Signed)
Office Visit Note   Patient: Peter Manning           Date of Birth: 09/17/1955           MRN: [id number] Visit Date: 10/25/2017              Requested by: Nolene Ebbs, MD [address] [place], [place] [address] PCP: Nolene Ebbs, MD   Assessment & Plan: Visit Diagnoses:  1. Pain in left hip   2. Chronic bilateral low back pain with left-sided sciatica   3. Impingement syndrome of left shoulder   4. S/P revision of total hip     Plan: Patient has solid fusion for instability and stenosis at L4-S1.  Remaining 3 levels show some mild changes with some degrees of disc space narrowing and spurring.  No radiculopathy.  Total hip total knee are doing well.  I agreed with him that he needs to work on weight loss to help his back get back to working out light activities.  We discussed activities that would bother his left biceps tendon.  Previous MRI of his opposite right shoulder showed some impingement.  If he has persistent left shoulder symptoms he will call and either can return for injection around the biceps tendon or MRI imaging of the left shoulder to rule out partial biceps tendon tear.  Follow-Up Instructions: Return if symptoms worsen or fail to improve.   Orders:  Orders Placed This Encounter  Procedures  . XR HIP UNILAT W OR W/O PELVIS 2-3 VIEWS LEFT  . XR Lumbar Spine Complete   No orders of the defined types were placed in this encounter.     Procedures: No procedures performed   Clinical Data: No additional findings.   Subjective: Chief Complaint  Patient presents with  . Left Shoulder - Pain  . Lower Back - Pain  . Left Hip - Pain    HPI 62 year old male returns with some lateral hip aching pain on the left.  He has some back pain states he is gained some weight and needs to work on losing weight.  He is continued to have discomfort in his left shoulder particularly anteriorly over the biceps tendon.  He has full range of motion sometimes he has  aching pain in the shoulder.  He states is not severe enough to consider an injection.  Previous left total hip arthroplasty with revision after 16 years for probably replacement.  He is on Percocet 10/325 1 p.o. 3 times daily and has had some narcotic medication for at least 15 years.  Right total knee arthroplasty is doing well.  Review of Systems  14 pt reviewed updated unchanged from last office visit other than as mentioned above.   Objective: Vital Signs: BP 113/67 (BP Location: Right Arm, Patient Position: Sitting, Cuff Size: Large)   Pulse 66   Ht 5\' 8"  (1.727 m)   Wt 254 lb (115.2 kg)   BMI 38.62 kg/m   Physical Exam  Constitutional: He is oriented to person, place, and time. He appears well-developed and well-nourished.  HENT:  Head: Normocephalic and atraumatic.  Eyes: Pupils are equal, round, and reactive to light. EOM are normal.  Neck: No tracheal deviation present. No thyromegaly present.  Cardiovascular: Normal rate.  Pulmonary/Chest: Effort normal. He has no wheezes.  Abdominal: Soft. Bowel sounds are normal.  Neurological: He is alert and oriented to person, place, and time.  Skin: Skin is warm and dry. Capillary refill takes less than 2 seconds.  Psychiatric: He has a normal mood and affect. His behavior is normal. Judgment and thought content normal.    Ortho Exam well-healed posterior left total hip arthroplasty and right anterior total knee arthroplasty incision.  No pain with hip range of motion minimal trochanteric bursal tenderness minimal sciatic notch tenderness negative straight leg raising.  He can heel and toe walk.  Sensory testing is normal.  Well-healed lumbar incision.  Good lumbar excursion.  No hip flexion weakness.  Reflexes are intact.  No rash over exposed skin.  Specialty Comments:  No specialty comments available.  Imaging: Xr Hip Unilat W Or W/o Pelvis 2-3 Views Left  Result Date: 10/25/2017 AP pelvis frog-leg left hip x-rays obtained and  reviewed.  This shows well-positioned left total hip arthroplasty.  No evidence of eccentric polyethylene wear no lucency.  No acute changes. Impression: Post left total hip arthroplasty.  No significant right hip osteoarthritis.  Left THA remains in satisfactory position  Xr Lumbar Spine Complete  Result Date: 10/25/2017 AP lateral lumbar spine x-rays obtained and reviewed including lateral flexion-extension x-ray.  This shows previous fusion L4-S1 with interbody graft good consolidation.  Mild narrowing at L3-4 above the fusion and some spurring at L1 to endplates.  Negative for acute changes. Impression: Previous L4-S1 fusion instrumented.  Mild disc degeneration with narrowing at remaining levels.  No acute changes.    PMFS History: Patient Active Problem List   Diagnosis Date Noted  . History of lumbar fusion 05/03/2017  . Impingement syndrome of left shoulder 05/03/2017  . S/P total knee arthroplasty, right 12/28/2016  . Right knee DJD 10/10/2016  . 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 OSA (obstructive sleep apnea) 12/09/2011  . Chronic bronchitis ([place]) 12/09/2011  . Tobacco abuse 12/09/2011   Past Medical History:  Diagnosis Date  . Bronchitis   . Hepatitis 1976   pt. doesn't remember if A, B, C?  . History of kidney stones   . Hyperlipidemia   . Hypertension   . OSA (obstructive sleep apnea) 12/09/2011   PSG 01/03/12>>AHI 45.7, SpO2 low 73%, PLMI 0.  CPAP 15 cm H2O>>AHI 0, +R.   Marland Kitchen Pneumonia 2015  . Renal disorder   . Sleep apnea    no CPAP ordered per patient    Family History  Problem Relation Age of Onset  . Heart attack Father   . Cancer Mother     Past Surgical History:  Procedure Laterality Date  . BACK SURGERY    . COLONOSCOPY    . CYSTOSCOPY WITH RETROGRADE PYELOGRAM, URETEROSCOPY AND STENT PLACEMENT Left 07/06/2017   Procedure: CYSTOSCOPY WITH LEFT RETROGRADE PYELOGRAM, URETEROSCOPY WITH HOLMIUM LASER BASKET EXTRACTION AND STENT PLACEMENT;  Surgeon: [name], MD;  Location: [facility];  Service: Urology;  Laterality: Left;  . JOINT REPLACEMENT    . REVISION TOTAL HIP ARTHROPLASTY Left 11/2015  . TOTAL HIP ARTHROPLASTY    . TOTAL HIP REVISION Left 11/18/2015   Procedure: LEFT TOTAL HIP REVISION;  Surgeon: Marybelle Killings, MD;  Location: [facility];  Service: Orthopedics;  Laterality: Left;  . TOTAL KNEE ARTHROPLASTY Right 10/10/2016   Procedure: TOTAL KNEE ARTHROPLASTY;  Surgeon: Marybelle Killings, MD;  Location: [facility];  Service: Orthopedics;  Laterality: Right;   Social History   Occupational History  . Not on file  Tobacco Use  . Smoking status: Current Some Day Smoker    Packs/day: 0.50    Years: 46.00    Pack years: 23.00  Types: Cigarettes  . Smokeless tobacco: Never Used  . Tobacco comment: states on and off smoker  Substance and Sexual Activity  . Alcohol use: No  . Drug use: No  . Sexual activity: Not on file

## 2017-11-26 IMAGING — DX DG HIP (WITH OR WITHOUT PELVIS) 2-3V*L*
4 series · 4 of 4 positions shown · non-contrast
Comparison: 02/18/2014

CLINICAL DATA: Left hip pain and popping for 2 days. Prior hip
replacement.

EXAM:
DG HIP (WITH OR WITHOUT PELVIS) 2-3V LEFT

[pelvis ap (1 of 2)]
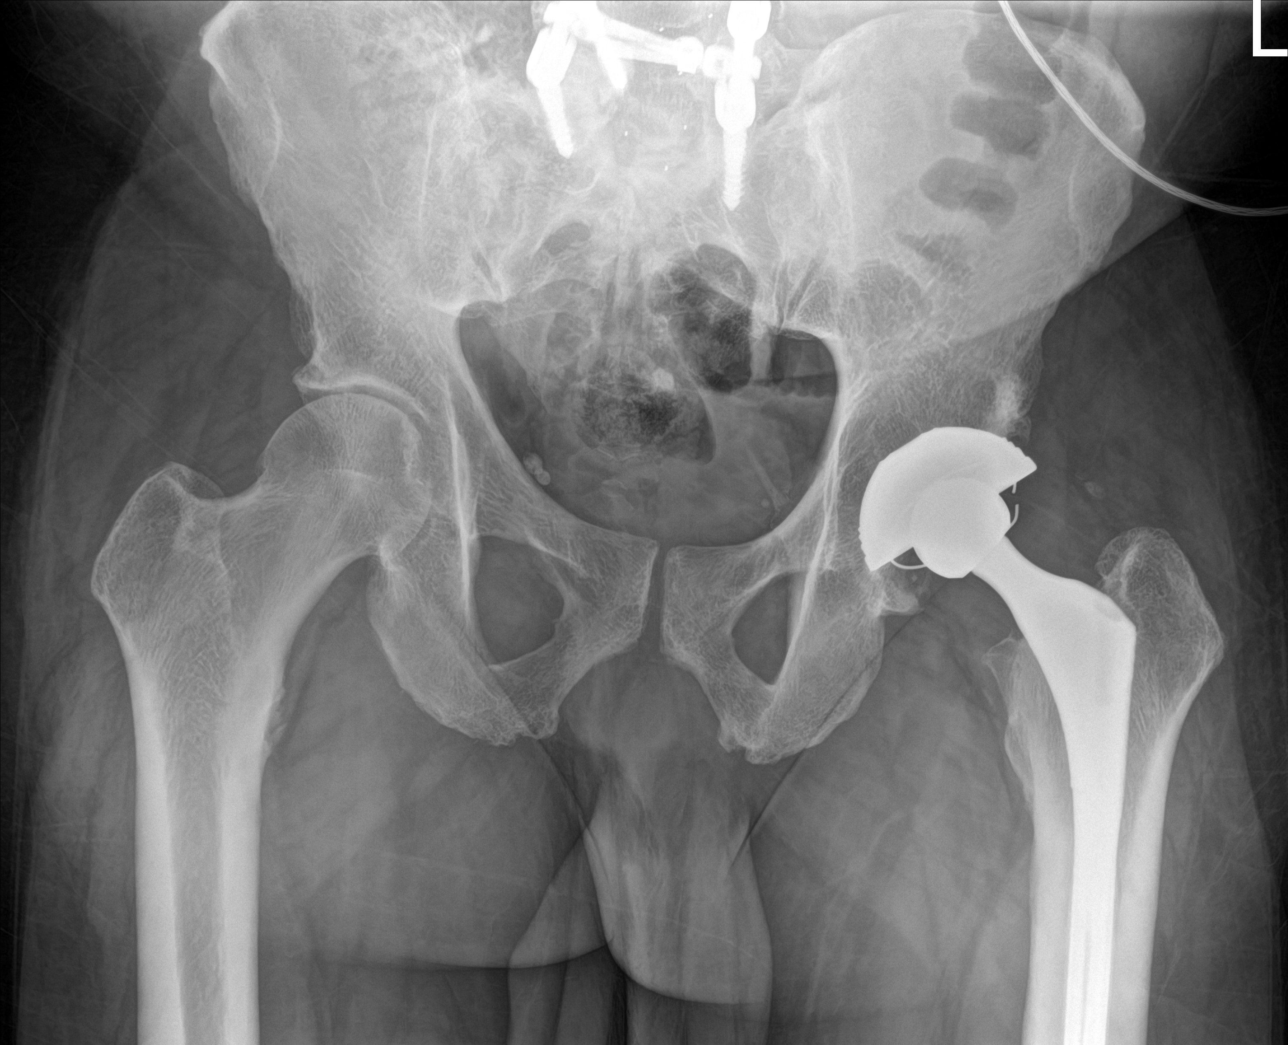

[hip ap]
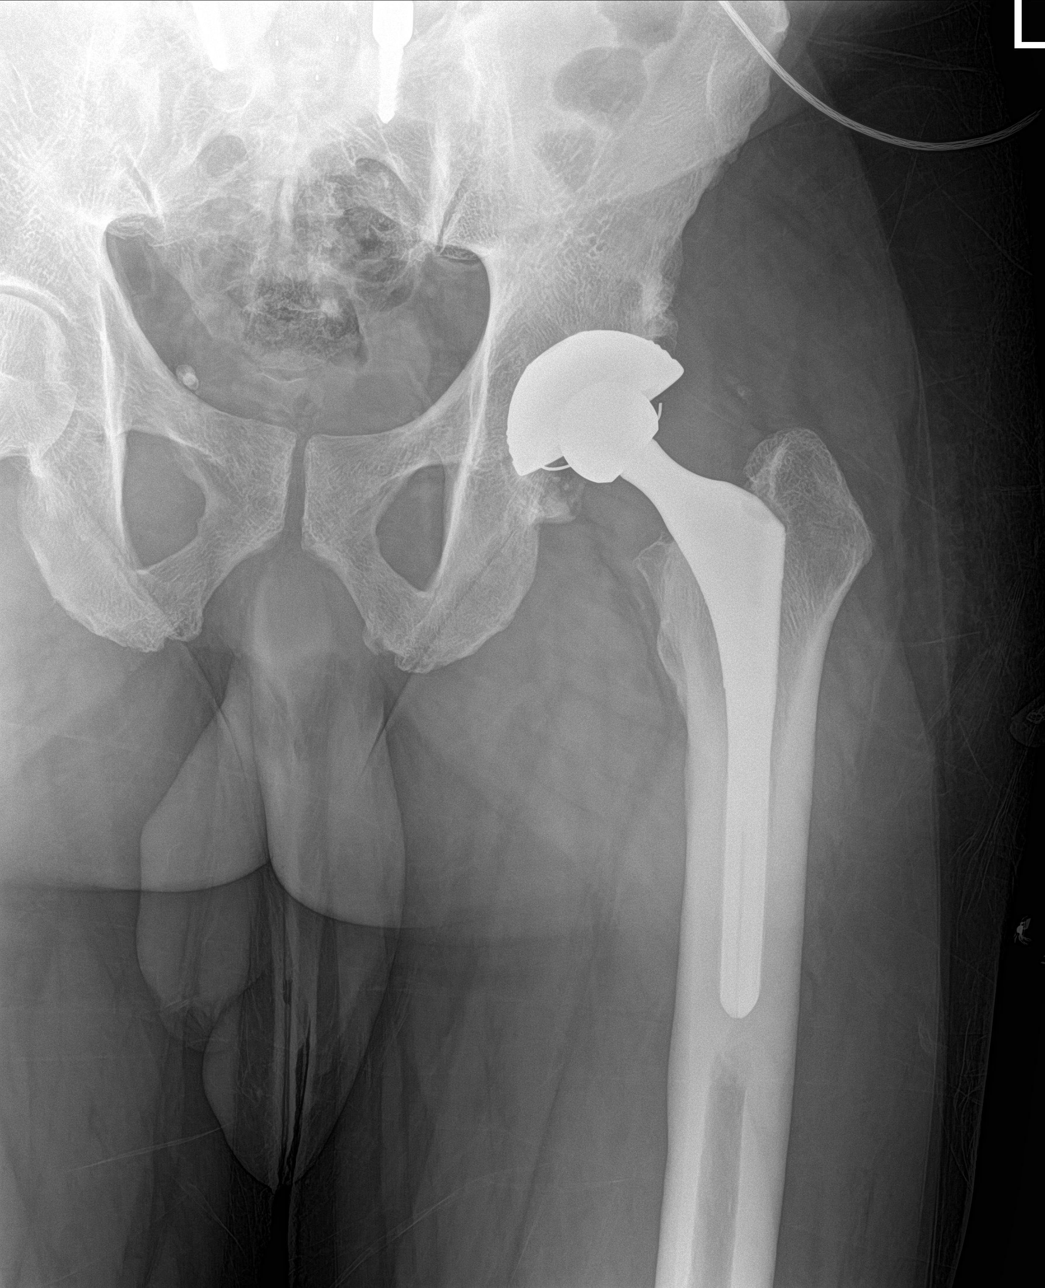

[hip lat]
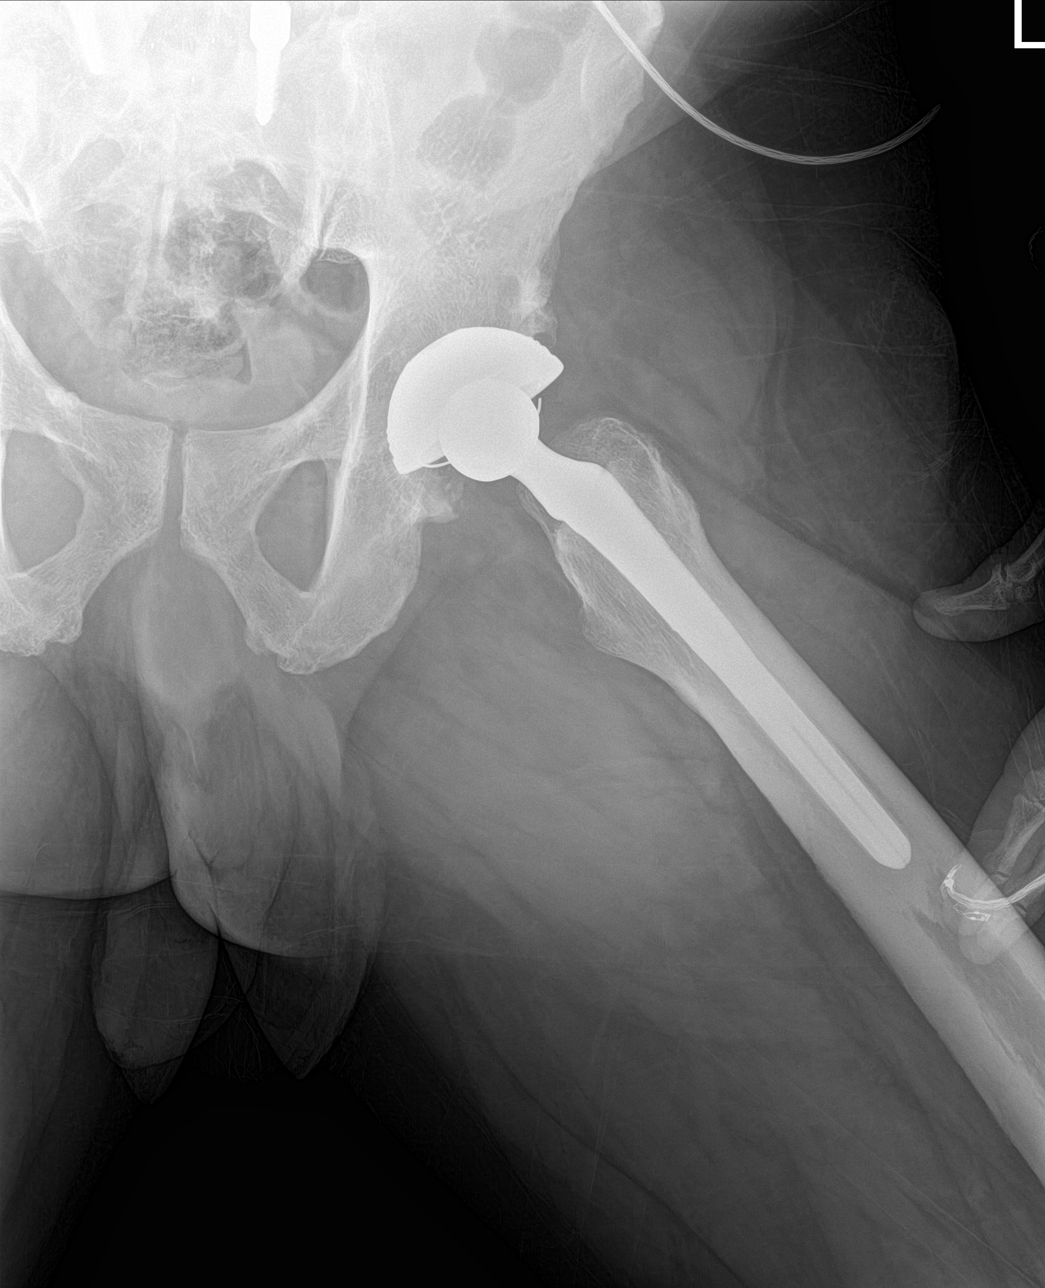

[pelvis ap (2 of 2)]
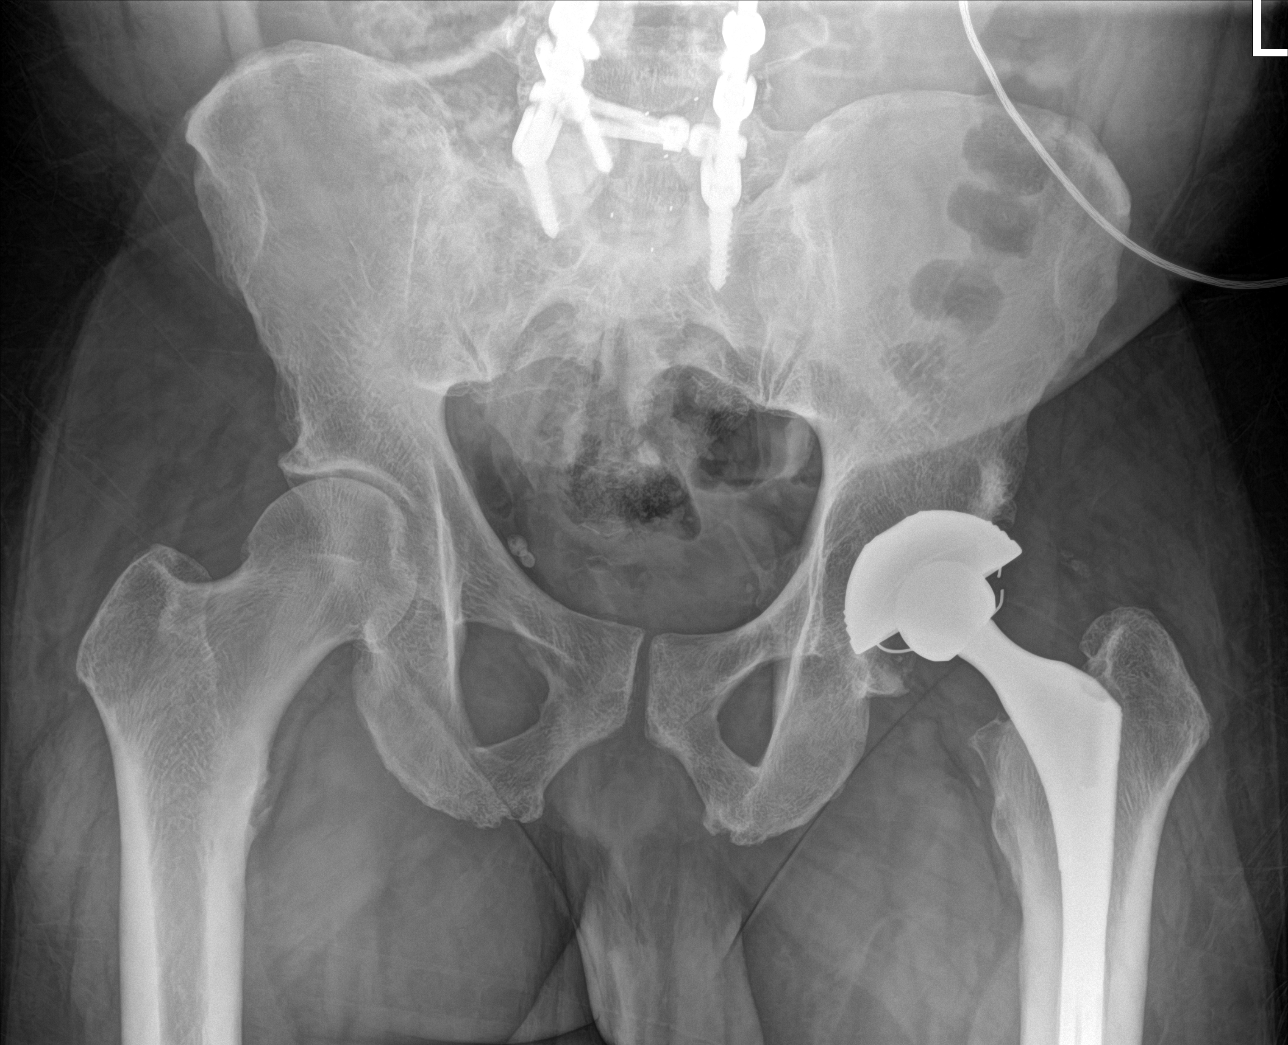

[4 of 4 positions shown; findings below may reference images not displayed]

FINDINGS: Sequelae of left total hip arthroplasty are again identified. There
is no evidence of dislocation or periprosthetic fracture. There is
new displacement of the polyethylene liner which appears rotated
dorsally. Mild right hip osteoarthrosis is again seen. Lumbosacral
fusion instrumentation is partially visualized. Pelvic phleboliths
are noted.
IMPRESSION: 1. Prior left total hip arthroplasty with new displacement of the
polyethylene liner.
2. No acute fracture or dislocation.

## 2017-12-05 IMAGING — DX DG CHEST 2V
2 series · 2 of 2 positions shown · non-contrast
Comparison: 09/27/2015

CLINICAL DATA: Fell tonight while going to the bathroom.

EXAM:
CHEST  2 VIEW

[chest lat]
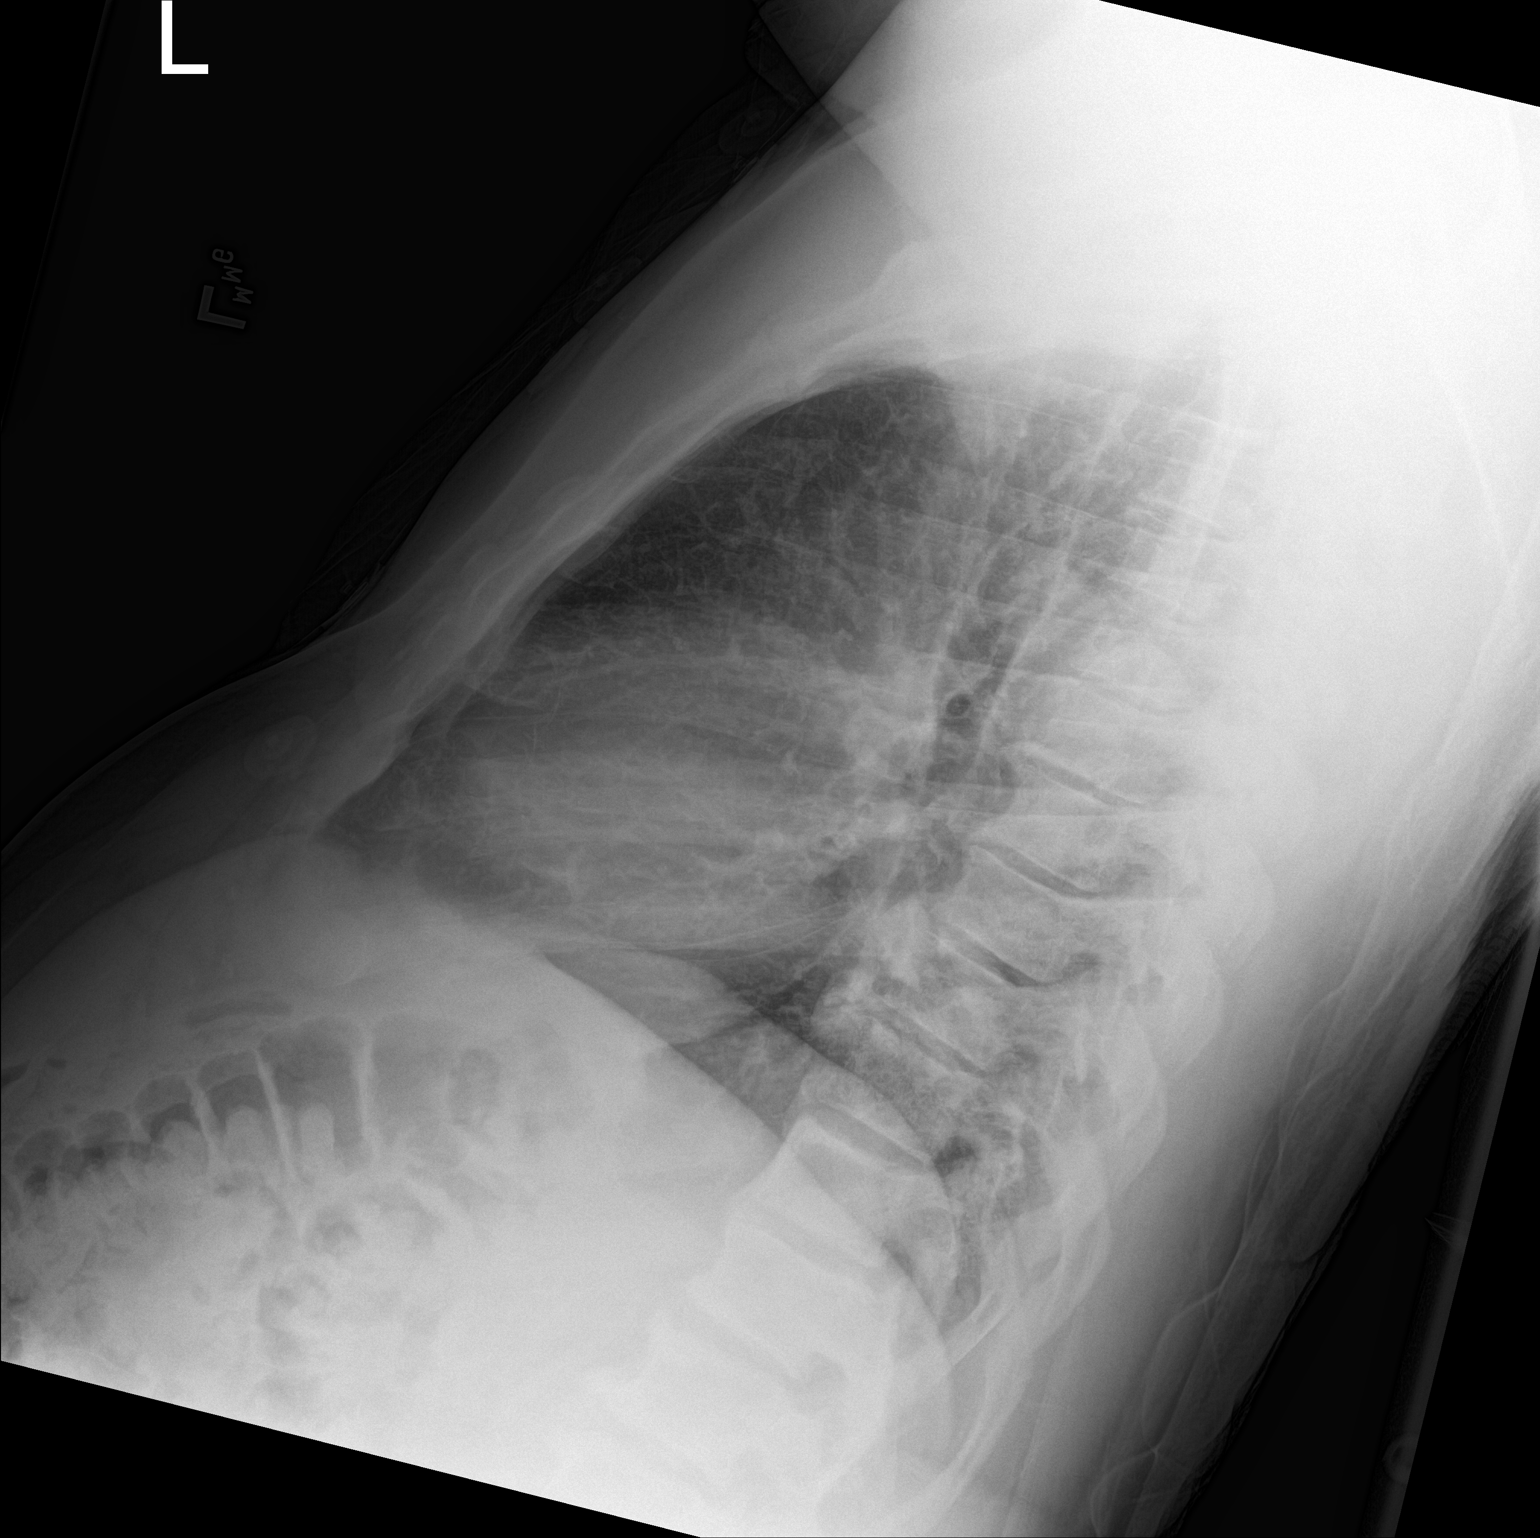

[chest ap]
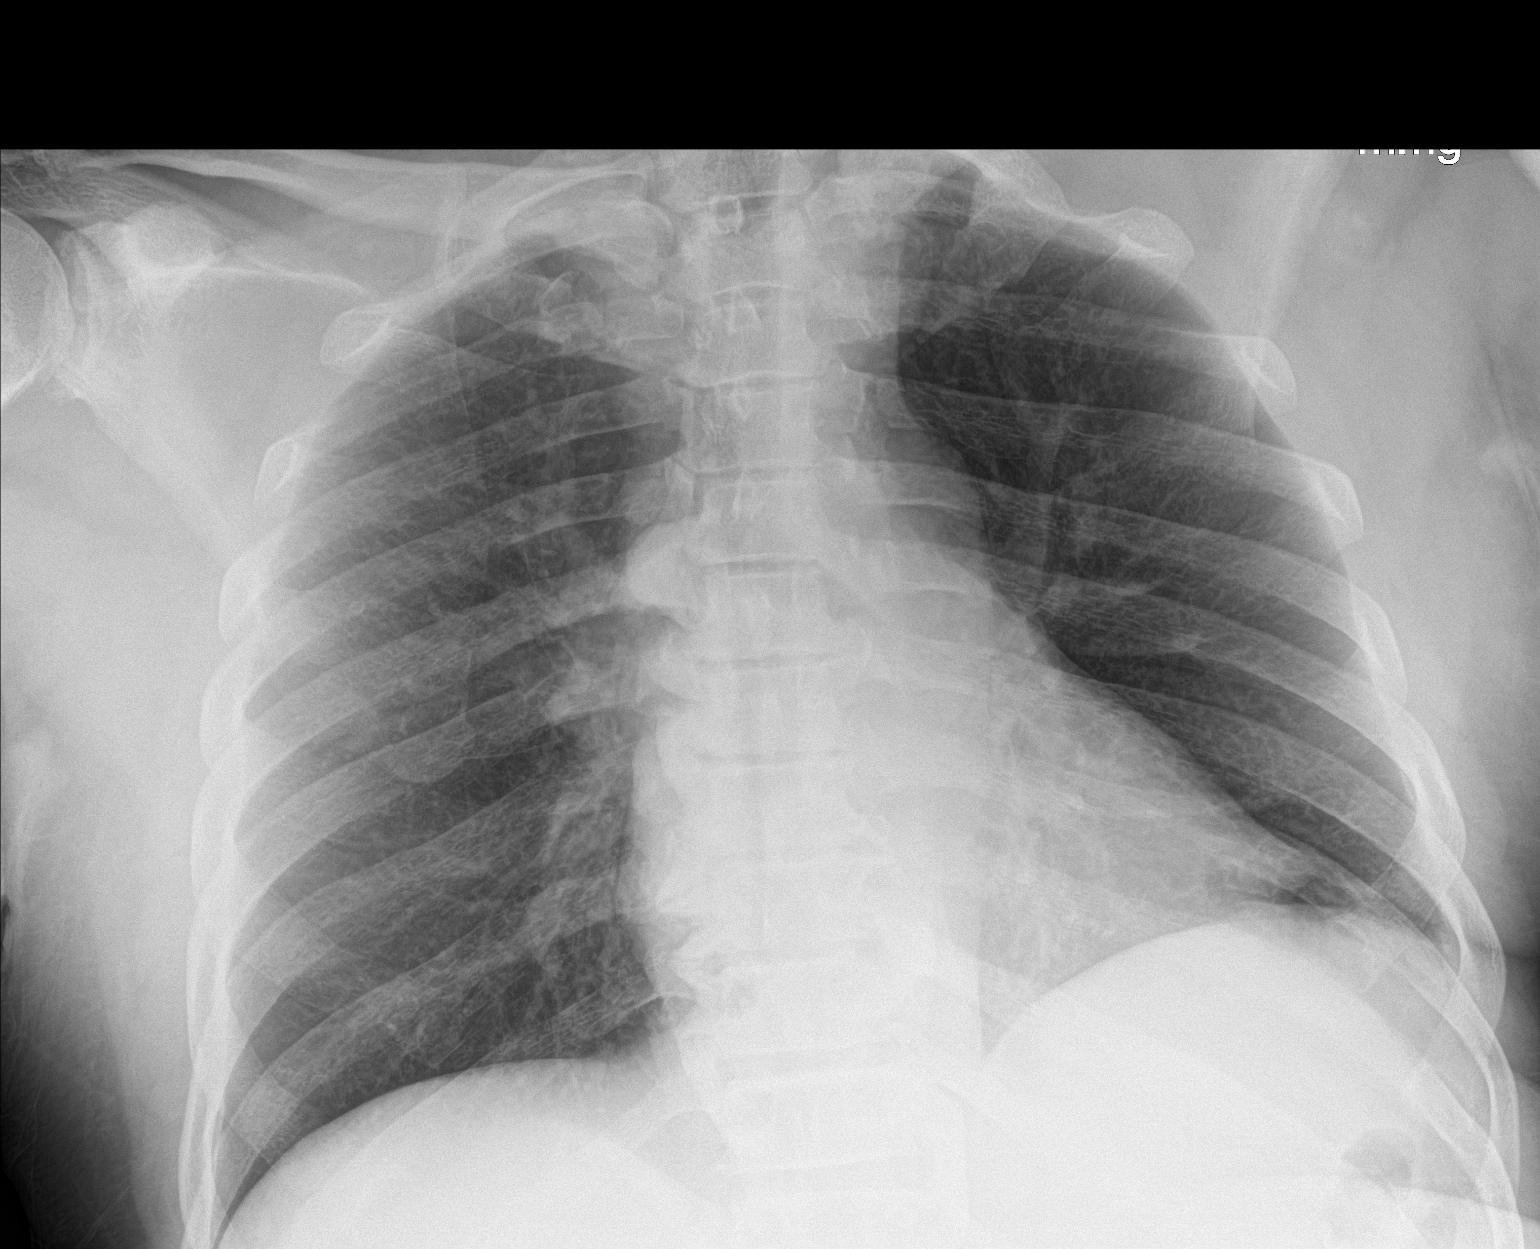

[2 of 2 positions shown; findings below may reference images not displayed]

FINDINGS: The lungs are clear. The pulmonary vasculature is normal. Heart size
is normal. Hilar and mediastinal contours are unremarkable. There is
no pleural effusion.
IMPRESSION: No active cardiopulmonary disease.

## 2017-12-18 ENCOUNTER — Ambulatory Visit: Payer: Medicare Other | Admitting: Podiatry

## 2017-12-19 ENCOUNTER — Encounter (HOSPITAL_COMMUNITY): Payer: Self-pay | Admitting: Emergency Medicine

## 2017-12-19 ENCOUNTER — Emergency Department (HOSPITAL_COMMUNITY)
Admission: EM | Admit: 2017-12-19 | Discharge: 2017-12-19 | Disposition: A | Discharge status: Home / Self Care | Payer: Medicare Other | Attending: Emergency Medicine | Admitting: Emergency Medicine

## 2017-12-19 ENCOUNTER — Other Ambulatory Visit: Payer: Self-pay

## 2017-12-19 ENCOUNTER — Emergency Department (HOSPITAL_COMMUNITY): Payer: Medicare Other

## 2017-12-19 DIAGNOSIS — I10 Essential (primary) hypertension: Secondary | ICD-10-CM | POA: Insufficient documentation

## 2017-12-19 DIAGNOSIS — Z96651 Presence of right artificial knee joint: Secondary | ICD-10-CM | POA: Diagnosis not present

## 2017-12-19 DIAGNOSIS — Z79899 Other long term (current) drug therapy: Secondary | ICD-10-CM | POA: Diagnosis not present

## 2017-12-19 DIAGNOSIS — G8929 Other chronic pain: Secondary | ICD-10-CM | POA: Insufficient documentation

## 2017-12-19 DIAGNOSIS — F17210 Nicotine dependence, cigarettes, uncomplicated: Secondary | ICD-10-CM | POA: Diagnosis not present

## 2017-12-19 DIAGNOSIS — Z96642 Presence of left artificial hip joint: Secondary | ICD-10-CM | POA: Insufficient documentation

## 2017-12-19 DIAGNOSIS — Z7982 Long term (current) use of aspirin: Secondary | ICD-10-CM | POA: Insufficient documentation

## 2017-12-19 DIAGNOSIS — M545 Low back pain: Secondary | ICD-10-CM | POA: Insufficient documentation

## 2017-12-19 NOTE — ED Triage Notes (Signed)
Pt complains of ongoing lower back pain- pain goes into bilateral legs. Pt had back surgery several years ago. Pt is ambulatory.

## 2017-12-19 NOTE — Discharge Instructions (Signed)
Take your regularly prescribed pain medications for pain.  Follow-up with Dr. Inda Merlin for further evaluation.  Follow-up with your primary care doctor in the next 2 to 4 days for further evaluation.  Return to the Emergency Department immediately for any worsening back pain, neck pain, difficulty walking, numbness/weaknss of your arms or legs, urinary or bowel accidents, fever or any other worsening or concerning symptoms.

## 2017-12-19 NOTE — ED Provider Notes (Signed)
Peter Manning Note   CSN: [id number] Arrival date & time: 12/19/17  7824     History   Chief Complaint Chief Complaint  Patient presents with  . Back Pain    HPI Peter Manning is a 62 y.o. male past medical history of chronic back pain, hepatitis, kidney stones, hyperlipidemia, hypertension who presents for evaluation of continued chronic back pain.  Patient reports he has a history of chronic back pain stemming from back surgeries in 2008.  Patient states that he has been having progressively worsening back pain over the last 4 weeks.  Patient states that he has been taking pain medications which has helped but he states he wanted to get it checked out to make sure there is nothing else that was wrong.  He denies any preceding trauma, injury, fall.  He has been able to ambulate without any difficulty.  Patient reports that he was previously prescribed Percocet from his pain management doctor which he states he has been taking. Patient states that he has some pain that radiates to his bilateral hips.  He states he is not having any numbness/weakness of his extremities. Denies fevers, weight loss, numbness/weakness of upper and lower extremities, bowel/bladder incontinence, saddle anesthesia, history of IVDA.   The history is provided by the patient.    Past Medical History:  Diagnosis Date  . Bronchitis   . Hepatitis 1976   pt. doesn't remember if A, B, C?  . History of kidney stones   . Hyperlipidemia   . Hypertension   . OSA (obstructive sleep apnea) 12/09/2011   PSG 01/03/12>>AHI 45.7, SpO2 low 73%, PLMI 0.  CPAP 15 cm H2O>>AHI 0, +R.   Marland Kitchen Pneumonia 2015  . Renal disorder   . Sleep apnea    no CPAP ordered per patient    Patient Active Problem List   Diagnosis Date Noted  . History of lumbar fusion 05/03/2017  . Impingement syndrome of left shoulder 05/03/2017  . S/P total knee arthroplasty, right 12/28/2016  . Right knee DJD  10/10/2016  . 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 OSA (obstructive sleep apnea) 12/09/2011  . Chronic bronchitis ([place]) 12/09/2011  . Tobacco abuse 12/09/2011    Past Surgical History:  Procedure Laterality Date  . BACK SURGERY    . COLONOSCOPY    . CYSTOSCOPY WITH RETROGRADE PYELOGRAM, URETEROSCOPY AND STENT PLACEMENT Left 07/06/2017   Procedure: CYSTOSCOPY WITH LEFT RETROGRADE PYELOGRAM, URETEROSCOPY WITH HOLMIUM LASER BASKET EXTRACTION AND STENT PLACEMENT;  Surgeon: [name], MD;  Location: [facility];  Service: Urology;  Laterality: Left;  . JOINT REPLACEMENT    . REVISION TOTAL HIP ARTHROPLASTY Left 11/2015  . TOTAL HIP ARTHROPLASTY    . TOTAL HIP REVISION Left 11/18/2015   Procedure: LEFT TOTAL HIP REVISION;  Surgeon: Marybelle Killings, MD;  Location: [place];  Service: Orthopedics;  Laterality: Left;  . TOTAL KNEE ARTHROPLASTY Right 10/10/2016   Procedure: TOTAL KNEE ARTHROPLASTY;  Surgeon: Marybelle Killings, MD;  Location: [place] [facility];  Service: Orthopedics;  Laterality: Right;        Home Medications    Prior to Admission medications   Medication Sig Start Date End Date Taking? Authorizing Manning  albuterol (PROVENTIL HFA;VENTOLIN HFA) 108 (90 BASE) MCG/ACT inhaler Inhale 1-2 puffs into the lungs every 6 (six) hours as needed for wheezing or shortness of breath. 04/17/14   Hyman Bible, PA-C  albuterol (PROVENTIL HFA;VENTOLIN HFA) 108 (90 Base) MCG/ACT inhaler  Inhale 2 puffs into the lungs every 4 (four) hours as needed for wheezing or shortness of breath. 07/19/17   Lacretia Leigh, MD  amLODipine (NORVASC) 10 MG tablet Take 10 mg by mouth daily.  08/24/16   Manning, Historical, MD  aspirin EC 325 MG EC tablet Take 1 tablet (325 mg total) by mouth daily with breakfast. 10/12/16   Lanae Crumbly, PA-C  azithromycin (ZITHROMAX) 250 MG tablet Take 1 tablet (250 mg total) by mouth daily. Take first 2 tablets together, then 1 every day until finished. 08/01/17   Fransico Meadow,  PA-C  doxycycline (VIBRAMYCIN) 100 MG capsule Take 1 capsule (100 mg total) by mouth 2 (two) times daily. One po bid x 7 days 10/17/17   Etta Quill, NP  oxyCODONE-acetaminophen (PERCOCET) 10-325 MG tablet Take 1 tablet by mouth every 6 (six) hours as needed for pain. 07/06/17   [name], MD  phenazopyridine (PYRIDIUM) 200 MG tablet Take 1 tablet (200 mg total) by mouth 3 (three) times daily as needed for pain. 07/06/17   [name], MD  polyethylene glycol [facility] / Floria Raveling) packet Take 17 g by mouth daily as needed for mild constipation. Patient not taking: Reported on 06/22/2017 10/12/16   Lanae Crumbly, PA-C  potassium chloride SA (K-DUR,KLOR-CON) 20 MEQ tablet Take 1 tablet (20 mEq total) by mouth daily. 12/04/16   Julianne Rice, MD  predniSONE (STERAPRED UNI-PAK 21 TAB) 10 MG (21) TBPK tablet Take by mouth daily. Take 6 tabs by mouth daily  for 2 days, then 5 tabs for 2 days, then 4 tabs for 2 days, then 3 tabs for 2 days, 2 tabs for 2 days, then 1 tab by mouth daily for 2 days 07/19/17   Lacretia Leigh, MD    Family History Family History  Problem Relation Age of Onset  . Heart attack Father   . Cancer Mother     Social History Social History   Tobacco Use  . Smoking status: Current Some Day Smoker    Packs/day: 0.50    Years: 46.00    Pack years: 23.00    Types: Cigarettes  . Smokeless tobacco: Never Used  . Tobacco comment: states on and off smoker  Substance Use Topics  . Alcohol use: No  . Drug use: No     Allergies   Patient has no known allergies.   Review of Systems Review of Systems  Constitutional: Negative for fever.  Musculoskeletal: Positive for back pain.  Neurological: Negative for weakness and numbness.     Physical Exam Updated Vital Signs BP 129/84 (BP Location: Left Arm)   Pulse (!) 59   Temp 97.9 F (36.6 C) (Oral)   Resp 18   Ht 5\' 8"  (1.727 m)   Wt 114.3 kg (252 lb)   SpO2 97%   BMI 38.32 kg/m   Physical Exam  Constitutional:  He appears well-developed and well-nourished.  HENT:  Head: Normocephalic and atraumatic.  Eyes: Conjunctivae and EOM are normal. Right eye exhibits no discharge. Left eye exhibits no discharge. No scleral icterus.  Neck:  Full flexion/extension and lateral movement of neck fully intact. No bony midline tenderness. No deformities or crepitus.   Pulmonary/Chest: Effort normal.  Musculoskeletal:       Thoracic back: He exhibits no tenderness.       Back:  No midline T-spine tenderness.  Diffuse lumbar tenderness noted to the entire lumbar region that extends over the paraspinal muscles into the region.  No deformity  or crepitus noted.  Flexion/extension is limited but is intact.  Neurological: He is alert.  Follows commands, Moves all extremities  5/5 strength to BUE and BLE  Sensation intact throughout all major nerve distributions Normal gait.   Skin: Skin is warm and dry.  Psychiatric: He has a normal mood and affect. His speech is normal and behavior is normal.  Nursing note and vitals reviewed.    ED Treatments / Results  Labs (all labs ordered are listed, but only abnormal results are displayed) Labs Reviewed - No data to display  EKG None  Radiology Dg Lumbar Spine Complete  Result Date: 12/19/2017 CLINICAL DATA:  Lumbago EXAM: LUMBAR SPINE - COMPLETE 4+ VIEW COMPARISON:  October 25, 2017 FINDINGS: Frontal, lateral, spot lumbosacral lateral, and bilateral oblique views were obtained. There are 5 non-rib-bearing lumbar type vertebral bodies. The patient has had previous posterior screw and plate fixation at L4, L5, and S1 with disc spacers at L4-5 and L5-S1. Tips of the screws are in the respective vertebral bodies. There is no appreciable fracture or spondylolisthesis. There is moderate disc space narrowing at L4-5 and L5-S1. There is facet osteoarthritic change at L3-4, L4-5, and L5-S1 bilaterally. There is a total hip replacement on the left. IMPRESSION: Postoperative change  from L4-S1 with support hardware intact. No fracture or spondylolisthesis. Osteoarthritic change in the lower lumbar region present, essentially stable. Total hip replacement noted on the left. Electronically Signed   By: Lowella Grip III M.D.   On: 12/19/2017 12:01    Procedures Procedures (including critical care time)  Medications Ordered in ED Medications - No data to display   Initial Impression / Assessment and Plan / ED Course  I have reviewed the triage vital signs and the nursing notes.  Pertinent labs & imaging results that were available during my care of the patient were reviewed by me and considered in my medical decision making (see chart for details).      62 y.o. M chronic back pain of lower back pain.  Patient reports is been ongoing for last 4 weeks.  Seen by his pain management doctor prescribed medications.  Denies any preceding trauma, injury, fall.  No numbness/weakness of his extremities, saddle anesthesia, urinary incontinence, fevers.  Patient reports that he came today because he wanted to get it checked out. Patient is afebrile, non-toxic appearing, sitting comfortably on examination table. Vital signs reviewed and stable.  History of back surgery in 2008. No red flag symptoms.  No neurological deficits on exam.  On exam, patient has diffuse tenderness over the entire lumbar region.  Extends over the midline.  No deformities or crepitus noted.  History/physical exam is not concerning for discitis, cauda equina, spinal abscess.  History/physical exam is not concerning for aortic dissection.  I suspect that this is continued of patient's chronic back pain.  Reviewed patient on prescription monitoring program.  He was recently prescribed 90 oxycodone pills on 12/06/2017.  Patient has a long-standing history of multiple narcotic prescriptions.  I suspect this is from his pain management doctor.  I discussed this with patient he stated that "oh no I am not here for pain  medications I just wanted to get it checked out get an x-ray."  I explained to patient that we will not be providing any narcotic pain medication for his chronic pain that if he continues to have chronic pain, he will follow-up with his pain management doctor.  Given that he is 76, it is reasonable to  check a lumbar x-ray.  Lumbar x-ray reviewed.  No bony abnormality.  Patient is ambulate in the part without any difficulty.  Vital signs are stable.  Encourage follow-up with his primary care doctor or pain management doctor.  Additionally, patient is seen orthopedics.  Encourage orthopedic follow-up. Patient had ample opportunity for questions and discussion. All patient's questions were answered with full understanding. Strict return precautions discussed. Patient expresses understanding and agreement to plan.    Final Clinical Impressions(s) / ED Diagnoses   Final diagnoses:  Chronic bilateral low back pain without sciatica    ED Discharge Orders    None       Volanda Napoleon, PA-C 12/19/17 1432    Daleen Bo, MD 12/20/17 1527

## 2017-12-19 NOTE — ED Notes (Signed)
Patient transported to X-ray 

## 2017-12-19 NOTE — ED Notes (Addendum)
Pt wakes from sleeping on this nurse arrival to room.Pt c/o back pain x 4 weeks. Denies known injury.

## 2017-12-19 NOTE — ED Notes (Signed)
Pt alert and oriented in NAD. Pt verbalized understanding of discharge instructions. 

## 2017-12-21 ENCOUNTER — Encounter: Payer: Self-pay | Admitting: Podiatry

## 2017-12-21 ENCOUNTER — Ambulatory Visit (INDEPENDENT_AMBULATORY_CARE_PROVIDER_SITE_OTHER): Payer: Medicare Other | Admitting: Podiatry

## 2017-12-21 DIAGNOSIS — L600 Ingrowing nail: Secondary | ICD-10-CM

## 2017-12-21 NOTE — Patient Instructions (Signed)

## 2017-12-21 NOTE — Progress Notes (Signed)
Subjective:   Patient ID: Peter Manning, male   DOB: 62 y.o.   MRN: [id number]   HPI Patient presents stating that he has severely thickened big toenail second toenail third nail right that he cannot cut they are painful and he wants them removed.  He has had several other nails removed in the past and they have done well and he wants the same procedure done for this   ROS      Objective:  Physical Exam  Neurovascular status intact muscle strength is adequate patient found to have severely thickened hallux second and third nails right that are dystrophic and painful when palpated     Assessment:  Damage to hallux second and third nails right that are dystrophic and painful with palpation     Plan:  H&P condition reviewed and recommended nail removal.  I explained the procedure and risk and patient wants surgery understanding risk and signed consent form.  Today I infiltrated the right 3 digits with 180 mg Xylocaine Marcaine mixture and under sterile technique after sterile prep I removed the hallux second and third nails post matrix and applied phenol 5 applications to the hallux and 4 applications of the second and third toes followed by alcohol lavage sterile dressing.  Gave instructions on soaks and reappoint

## 2018-01-02 ENCOUNTER — Encounter (INDEPENDENT_AMBULATORY_CARE_PROVIDER_SITE_OTHER): Payer: Self-pay | Admitting: Orthopaedic Surgery

## 2018-01-02 ENCOUNTER — Ambulatory Visit (INDEPENDENT_AMBULATORY_CARE_PROVIDER_SITE_OTHER): Payer: Medicare Other

## 2018-01-02 ENCOUNTER — Ambulatory Visit (INDEPENDENT_AMBULATORY_CARE_PROVIDER_SITE_OTHER): Payer: Medicare Other | Admitting: Orthopaedic Surgery

## 2018-01-02 VITALS — BP 130/82 | HR 79 | Ht 68.0 in | Wt 260.0 lb

## 2018-01-02 DIAGNOSIS — Z981 Arthrodesis status: Secondary | ICD-10-CM

## 2018-01-02 DIAGNOSIS — M25512 Pain in left shoulder: Secondary | ICD-10-CM | POA: Diagnosis not present

## 2018-01-02 DIAGNOSIS — G8929 Other chronic pain: Secondary | ICD-10-CM

## 2018-01-02 DIAGNOSIS — M9983 Other biomechanical lesions of lumbar region: Secondary | ICD-10-CM

## 2018-01-02 DIAGNOSIS — M48061 Spinal stenosis, lumbar region without neurogenic claudication: Secondary | ICD-10-CM

## 2018-01-02 NOTE — Progress Notes (Signed)
Office Visit Note   Patient: Peter Manning           Date of Birth: 03-12-1956           MRN: [id number] Visit Date: 01/02/2018              Requested by: Nolene Ebbs, MD [address] [place], [place] [address] PCP: Nolene Ebbs, MD   Assessment & Plan: Visit Diagnoses:  1. Chronic left shoulder pain     Plan: Plain radiographs showed acromioclavicular degenerative changes inferior spurring some calcification of the rotator cuff insertion site which is small.  Mild glenohumeral spurring.  Will obtain an MRI scan of his shoulder for evaluation with his previous history of trauma years ago to his shoulder.  No medications were prescribed today since he is on chronic pain management.  Office follow-up after MRI.  Follow-Up Instructions: No follow-ups on file.   Orders:  Orders Placed This Encounter  Procedures  . XR Shoulder Left  . MR Shoulder Left w/o contrast   No orders of the defined types were placed in this encounter.     Procedures: No procedures performed   Clinical Data: No additional findings.   Subjective: Chief Complaint  Patient presents with  . Lower Back - Pain    HPI 62 year old male returns states he is having some increase in his chronic back pain.  He went to the emergency room 12/19/2017 requested x-rays.  He states he is also had increased pain in his shoulder and has been on chronic narcotic medication oxycodone 10/325 and a pain management contract.  He is noted decreased range of motion of his left shoulder.  Pain with outstretched reaching.  History of an old shoulder injury and he thinks it might of been associated with the dislocation.  This was many years ago and patient does not exactly recall mechanism of injury.  X-rays in the emergency room showed 2 level fusion, previous left total hip arthroplasty.  X-rays April 2019 showed no evidence of loosening or eccentric wear his total hip arthroplasty.  Lumbar MRI scan 04/28/2017 showed  satisfactory decompression and fusion at L4-S1.  Adjacent segment L3-4 with moderate subarticular and foraminal stenosis.  Mild degenerative changes at L1-2 and L2-3 without stenosis or neural impingement.  Review of Systems positive for previous right total knee arthroplasty.  History of lumbar fusion L5-S1 later L4-S1 fusion.  Total hip arthroplasty on the left with later revision for poly-wear and poly-exchange procedure.  Chronic bronchitis, obstructive sleep apnea.  Pain management contract on Percocet 10/325 followed elsewhere.  14 point review of systems otherwise negative as it pertains HPI.   Objective: Vital Signs: BP 130/82   Pulse 79   Ht 5\' 8"  (1.727 m)   Wt 260 lb (117.9 kg)   BMI 39.53 kg/m   Physical Exam  Constitutional: He is oriented to person, place, and time. He appears well-developed and well-nourished.  HENT:  Head: Normocephalic and atraumatic.  Eyes: Pupils are equal, round, and reactive to light. EOM are normal.  Neck: No tracheal deviation present. No thyromegaly present.  Cardiovascular: Normal rate.  Pulmonary/Chest: Effort normal. He has no wheezes.  Abdominal: Soft. Bowel sounds are normal.  Neurological: He is alert and oriented to person, place, and time.  Skin: Skin is warm and dry. Capillary refill takes less than 2 seconds.  Psychiatric: He has a normal mood and affect. His behavior is normal. Judgment and thought content normal.    Ortho Exam well-healed right  total knee arthroplasty left posterior hip incision.  No trochanteric tenderness.  No pain with hip internal/external rotation.  Negative straight leg raising 90 degrees.  Knee and ankle jerk are intact.  Anterior tib gastrocsoleus is intact.  No quad weakness.  No hip flexion weakness right or left.  Distal pulses are intact.  50% range of motion left shoulder with pain.  Mild crepitus with internal/external rotation.  He can reach posteriorly to the axillary line with internal rotation.  Subscap  and external rotators are strong with elbow tucked at his side with resistance testing.  Positive crossarm adduction test with acromioclavicular tenderness.  Long head of the biceps is stable nontender.  Negative Yergason test.  Specialty Comments:  No specialty comments available.  Imaging: No results found.   PMFS History: Patient Active Problem List   Diagnosis Date Noted  . History of lumbar fusion 05/03/2017  . Impingement syndrome of left shoulder 05/03/2017  . S/P total knee arthroplasty, right 12/28/2016  . Right knee DJD 10/10/2016  . 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 OSA (obstructive sleep apnea) 12/09/2011  . Chronic bronchitis ([place]) 12/09/2011  . Tobacco abuse 12/09/2011   Past Medical History:  Diagnosis Date  . Bronchitis   . Hepatitis 1976   pt. doesn't remember if A, B, C?  . History of kidney stones   . Hyperlipidemia   . Hypertension   . OSA (obstructive sleep apnea) 12/09/2011   PSG 01/03/12>>AHI 45.7, SpO2 low 73%, PLMI 0.  CPAP 15 cm H2O>>AHI 0, +R.   Marland Kitchen Pneumonia 2015  . Renal disorder   . Sleep apnea    no CPAP ordered per patient    Family History  Problem Relation Age of Onset  . Heart attack Father   . Cancer Mother     Past Surgical History:  Procedure Laterality Date  . BACK SURGERY    . COLONOSCOPY    . CYSTOSCOPY WITH RETROGRADE PYELOGRAM, URETEROSCOPY AND STENT PLACEMENT Left 07/06/2017   Procedure: CYSTOSCOPY WITH LEFT RETROGRADE PYELOGRAM, URETEROSCOPY WITH HOLMIUM LASER BASKET EXTRACTION AND STENT PLACEMENT;  Surgeon: [name], MD;  Location: [facility];  Service: Urology;  Laterality: Left;  . JOINT REPLACEMENT    . REVISION TOTAL HIP ARTHROPLASTY Left 11/2015  . TOTAL HIP ARTHROPLASTY    . TOTAL HIP REVISION Left 11/18/2015   Procedure: LEFT TOTAL HIP REVISION;  Surgeon: Marybelle Killings, MD;  Location: [facility];  Service: Orthopedics;  Laterality: Left;  . TOTAL KNEE ARTHROPLASTY Right 10/10/2016   Procedure: TOTAL  KNEE ARTHROPLASTY;  Surgeon: Marybelle Killings, MD;  Location: [facility];  Service: Orthopedics;  Laterality: Right;   Social History   Occupational History  . Not on file  Tobacco Use  . Smoking status: Current Some Day Smoker    Packs/day: 0.50    Years: 46.00    Pack years: 23.00    Types: Cigarettes  . Smokeless tobacco: Never Used  . Tobacco comment: states on and off smoker  Substance and Sexual Activity  . Alcohol use: No  . Drug use: No  . Sexual activity: Not on file

## 2018-01-08 ENCOUNTER — Encounter (INDEPENDENT_AMBULATORY_CARE_PROVIDER_SITE_OTHER): Payer: Self-pay | Admitting: Orthopaedic Surgery

## 2018-01-08 DIAGNOSIS — M48061 Spinal stenosis, lumbar region without neurogenic claudication: Secondary | ICD-10-CM | POA: Insufficient documentation

## 2018-01-08 DIAGNOSIS — G8929 Other chronic pain: Secondary | ICD-10-CM | POA: Insufficient documentation

## 2018-01-08 DIAGNOSIS — M25512 Pain in left shoulder: Principal | ICD-10-CM

## 2018-01-11 ENCOUNTER — Telehealth (INDEPENDENT_AMBULATORY_CARE_PROVIDER_SITE_OTHER): Payer: Self-pay | Admitting: Orthopedic Surgery

## 2018-01-11 NOTE — Telephone Encounter (Signed)
Peter Manning called in to see if his shoulder MRI had been scheduled yet.  Please call him back to discuss.

## 2018-01-12 NOTE — Telephone Encounter (Signed)
IC pt back and informed him that [facility] has his MRI order and should be giving him a call to schedule but can call them to expedite. Pt took down # and will be calling

## 2018-01-14 ENCOUNTER — Ambulatory Visit
Admission: RE | Admit: 2018-01-14 | Discharge: 2018-01-14 | Disposition: A | Discharge status: Home / Self Care | Payer: Medicare Other | Source: Ambulatory Visit | Attending: Orthopaedic Surgery | Admitting: Orthopaedic Surgery

## 2018-01-14 DIAGNOSIS — M25512 Pain in left shoulder: Principal | ICD-10-CM

## 2018-01-14 DIAGNOSIS — G8929 Other chronic pain: Secondary | ICD-10-CM

## 2018-01-16 ENCOUNTER — Encounter (INDEPENDENT_AMBULATORY_CARE_PROVIDER_SITE_OTHER): Payer: Self-pay | Admitting: *Deleted

## 2018-01-24 ENCOUNTER — Ambulatory Visit (INDEPENDENT_AMBULATORY_CARE_PROVIDER_SITE_OTHER): Payer: Medicare Other | Admitting: Orthopaedic Surgery

## 2018-01-24 ENCOUNTER — Ambulatory Visit (INDEPENDENT_AMBULATORY_CARE_PROVIDER_SITE_OTHER): Payer: Medicare Other

## 2018-01-24 ENCOUNTER — Encounter (INDEPENDENT_AMBULATORY_CARE_PROVIDER_SITE_OTHER): Payer: Self-pay

## 2018-01-24 ENCOUNTER — Encounter (INDEPENDENT_AMBULATORY_CARE_PROVIDER_SITE_OTHER): Payer: Self-pay | Admitting: Orthopaedic Surgery

## 2018-01-24 VITALS — BP 121/77 | HR 59 | Ht 68.0 in | Wt 260.0 lb

## 2018-01-24 DIAGNOSIS — M542 Cervicalgia: Secondary | ICD-10-CM

## 2018-01-24 NOTE — Progress Notes (Signed)
Office Visit Note   Patient: Peter Manning           Date of Birth: [birth year]           MRN: [id number] Visit Date: 01/24/2018              Requested by: Nolene Ebbs, MD [address] [place], [place] [address] PCP: Nolene Ebbs, MD   Assessment & Plan: Visit Diagnoses:  1. Neck pain     Plan: Reviewed the plain radiographs as well as MRI scan of his shoulder.  He does have some rotator cuff tendinopathy but no tear of the rotator cuff.  Significant acromioclavicular degenerative cystic changes similar to his opposite right shoulder MRI that he had a few years ago.  He is now having increased neck and arm pain.  We will put him on a 5 mg prednisone Dosepak taper.  Recheck 2 months.  If he has persistent problems we may need to consider cervical spine imaging.  Follow-Up Instructions: Return in about 2 months (around 03/27/2018).   Orders:  Orders Placed This Encounter  Procedures  . XR Cervical Spine 2 or 3 views   No orders of the defined types were placed in this encounter.     Procedures: No procedures performed   Clinical Data: No additional findings.   Subjective: Chief Complaint  Patient presents with  . Left Shoulder - Pain, Follow-up    MRI Left Shoulder review    HPI 62 year old male returns she is having ongoing problems with his left shoulder and MRI has been obtained and is available for review.  He has pain with outstretched reaching but can get his arm up over his head.  He states he has been having more pain in his neck with rotation and the pain now seems to be radiating down to his hand making his hand numb.  Review of Systems 14 point review of systems updated unchanged from last office visit note.   Objective: Vital Signs: BP 121/77   Pulse (!) 59   Ht 5\' 8"  (1.727 m)   Wt 260 lb (117.9 kg)   BMI 39.53 kg/m   Physical Exam  Constitutional: He is oriented to person, place, and time. He appears well-developed and well-nourished.    HENT:  Head: Normocephalic and atraumatic.  Eyes: Pupils are equal, round, and reactive to light. EOM are normal.  Neck: No tracheal deviation present. No thyromegaly present.  Cardiovascular: Normal rate.  Pulmonary/Chest: Effort normal. He has no wheezes.  Abdominal: Soft. Bowel sounds are normal.  Neurological: He is alert and oriented to person, place, and time.  Skin: Skin is warm and dry. Capillary refill takes less than 2 seconds.  Psychiatric: He has a normal mood and affect. His behavior is normal. Judgment and thought content normal.    Ortho Exam healed hip and knee incision.  Bilateral brachial plexus tenderness.  Some discomfort with forward flexion increased pain with cervical compression some relief with distraction upper extremity reflexes are 2+ and symmetrical.  Well-healed right total knee incision without effusion.  Amatory with negative Trendelenburg gait.  Specialty Comments:  No specialty comments available.  Imaging: Xr Cervical Spine 2 Or 3 Views  Result Date: 01/24/2018 AP lateral oblique x-rays cervical spine obtained and reviewed.  This shows cervical spondylosis with anterior spurring disc space narrowing C4-5 C5-6.  Klippel-Feil deformity at C2-3. 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  Spondylosis mid cervical region with facet degenerative changes.  Negative for acute fracture.  PMFS History: Patient Active Problem List   Diagnosis Date Noted  . Chronic left shoulder pain 01/08/2018  . Lumbar foraminal stenosis 01/08/2018  . S/P lumbar fusion 05/03/2017  . Impingement syndrome of left shoulder 05/03/2017  . S/P total knee arthroplasty, right 12/28/2016  . Right knee DJD 10/10/2016  . 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 OSA (obstructive sleep apnea) 12/09/2011  . Chronic bronchitis ([place]) 12/09/2011  . Tobacco abuse 12/09/2011   Past Medical History:  Diagnosis Date  . Bronchitis   . Hepatitis 1976   pt. doesn't remember if A, B, C?  .  History of kidney stones   . Hyperlipidemia   . Hypertension   . OSA (obstructive sleep apnea) 12/09/2011   PSG 01/03/12>>AHI 45.7, SpO2 low 73%, PLMI 0.  CPAP 15 cm H2O>>AHI 0, +R.   Marland Kitchen Pneumonia 2015  . Renal disorder   . Sleep apnea    no CPAP ordered per patient    Family History  Problem Relation Age of Onset  . Heart attack Father   . Cancer Mother     Past Surgical History:  Procedure Laterality Date  . BACK SURGERY    . COLONOSCOPY    . CYSTOSCOPY WITH RETROGRADE PYELOGRAM, URETEROSCOPY AND STENT PLACEMENT Left 07/06/2017   Procedure: CYSTOSCOPY WITH LEFT RETROGRADE PYELOGRAM, URETEROSCOPY WITH HOLMIUM LASER BASKET EXTRACTION AND STENT PLACEMENT;  Surgeon: [name], MD;  Location: [facility];  Service: Urology;  Laterality: Left;  . JOINT REPLACEMENT    . REVISION TOTAL HIP ARTHROPLASTY Left 11/2015  . TOTAL HIP ARTHROPLASTY    . TOTAL HIP REVISION Left 11/18/2015   Procedure: LEFT TOTAL HIP REVISION;  Surgeon: Marybelle Killings, MD;  Location: [facility];  Service: Orthopedics;  Laterality: Left;  . TOTAL KNEE ARTHROPLASTY Right 10/10/2016   Procedure: TOTAL KNEE ARTHROPLASTY;  Surgeon: Marybelle Killings, MD;  Location: [place];  Service: Orthopedics;  Laterality: Right;   Social History   Occupational History  . Not on file  Tobacco Use  . Smoking status: Current Some Day Smoker    Packs/day: 0.50    Years: 46.00    Pack years: 23.00    Types: Cigarettes  . Smokeless tobacco: Never Used  . Tobacco comment: states on and off smoker  Substance and Sexual Activity  . Alcohol use: No  . Drug use: No  . Sexual activity: Not on file

## 2018-02-08 ENCOUNTER — Telehealth (INDEPENDENT_AMBULATORY_CARE_PROVIDER_SITE_OTHER): Payer: Self-pay | Admitting: Orthopaedic Surgery

## 2018-02-08 NOTE — Telephone Encounter (Signed)
Records faxed to [facility] per vm request from [facility]. She stated she had faxed 2 previous requests but the last request was from May.

## 2018-03-27 ENCOUNTER — Encounter (INDEPENDENT_AMBULATORY_CARE_PROVIDER_SITE_OTHER): Payer: Self-pay | Admitting: Orthopaedic Surgery

## 2018-03-27 ENCOUNTER — Ambulatory Visit (INDEPENDENT_AMBULATORY_CARE_PROVIDER_SITE_OTHER): Payer: Medicare Other | Admitting: Orthopaedic Surgery

## 2018-03-27 VITALS — BP 124/74 | HR 62 | Ht 68.0 in | Wt 257.6 lb

## 2018-03-27 DIAGNOSIS — Z96651 Presence of right artificial knee joint: Secondary | ICD-10-CM | POA: Diagnosis not present

## 2018-03-27 DIAGNOSIS — Z720 Tobacco use: Secondary | ICD-10-CM

## 2018-03-27 DIAGNOSIS — Z981 Arthrodesis status: Secondary | ICD-10-CM | POA: Diagnosis not present

## 2018-03-27 DIAGNOSIS — M47812 Spondylosis without myelopathy or radiculopathy, cervical region: Secondary | ICD-10-CM

## 2018-03-27 NOTE — Progress Notes (Signed)
Office Visit Note   Patient: Peter Manning           Date of Birth: Jul 14, 1955           MRN: [id number] Visit Date: 03/27/2018              Requested by: Nolene Ebbs, MD [address] [place], [place] [address] PCP: Nolene Ebbs, MD   Assessment & Plan: Visit Diagnoses:  1. Spondylosis without myelopathy or radiculopathy, cervical region   2. S/P lumbar fusion   3. S/P total knee arthroplasty, right   4. Tobacco abuse     Plan: Patient has been through an exercise program anti-inflammatories with persistent neck symptoms.  He has a congenital fusion at C2-3 with mid cervical spondylitic changes with spurring and facet arthropathy.  Persistent neck and arm pain with left hand numbness which temporarily responded to a prednisone pack.  Plain radiographs showed spondylitic changes in his neck with disc space narrowing and spurring.  He said symptoms for greater than 6 months.  I recommend proceeding with an MRI scan cervical spine without contrast and office follow-up after scan for review.  Follow-Up Instructions: No follow-ups on file.   Orders:  No orders of the defined types were placed in this encounter.  No orders of the defined types were placed in this encounter.     Procedures: No procedures performed   Clinical Data: No additional findings.   Subjective: Chief Complaint  Patient presents with  . Neck - Follow-up  . Left Shoulder - Follow-up    HPI 62 year old male with 83-month history of neck and right shoulder pain.  He states he has numbness in the left hand in the middle of the hand dorsally.  He still been able to work out do biceps triceps work and states he has not noticed any weakness in his left upper extremity versus his right.  Recent spinal cord stimulator trial which he states was not effective and made his pain worse.  He is in chronic pain management.  He takes Percocet 10/325 3 tablets daily.  He denies any problems with his left hip that  had total hip arthroplasty later revision surgery.  He said right total knee arthroplasty.  He is fused from L4-S1.  MRI scan 2018 showed some mild to moderate narrowing at L1-L2 3 and L3-4 above his fusion but no severe stenosis.  He did have a couple millimeters retrolisthesis.  C-spine x-rays that showed Klippel-Feil deformity at C2-3 with degenerative changes mid cervical spine.  Patient is treated to his neck symptoms with prednisone taper that helped for period time and then he had return of his symptoms after he stopped the prednisone pack.  He is on chronic narcotic medication he is used topical rubs and anti-inflammatories without relief.  Review of Systems 14 point review of system positive for chronic back pain on pain management.  Previous fusion L4-S1.  Total arthroplasty on the left with revision surgery.  Total knee arthroplasty on the right.  Obstructive sleep apnea.  Impingement left shoulder.  He still smokes.  Smoking cessation discussed.   Objective: Vital Signs: BP 124/74   Pulse 62   Ht 5\' 8"  (1.727 m)   Wt 257 lb 9.6 oz (116.8 kg)   BMI 39.17 kg/m   Physical Exam  Constitutional: He is oriented to person, place, and time. He appears well-developed and well-nourished.  HENT:  Head: Normocephalic and atraumatic.  Eyes: Pupils are equal, round, and reactive to light.  EOM are normal.  Neck: No tracheal deviation present. No thyromegaly present.  Cardiovascular: Normal rate.  Pulmonary/Chest: Effort normal. He has no wheezes.  Abdominal: Soft. Bowel sounds are normal.  Neurological: He is alert and oriented to person, place, and time.  Skin: Skin is warm and dry. Capillary refill takes less than 2 seconds.  Psychiatric: He has a normal mood and affect. His behavior is normal. Judgment and thought content normal.    Ortho Exam patient has brachial plexus tenderness right and left.  Upper extremity reflexes are 1+ and symmetrical biceps triceps wrist flexion extension is  strong.  No thenar or hyperthenar atrophy.  Positive Spurling right and left.  No lower extremity hyperreflexia.  Well-healed left posterior hip incision right anterior total knee arthroplasty incision well-healed lumbar incision.  2 spot scars are present from previous recent stimulator trial.  Specialty Comments:  No specialty comments available.  Imaging: AP lateral oblique x-rays cervical spine obtained and reviewed. This  shows cervical spondylosis with anterior spurring disc space narrowing  C4-5 C5-6. Klippel-Feil deformity at C2-3.  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 Spondylosis mid cervical region with  facet degenerative changes. Negative for acute fracture. Cervical radiograph date 01/24/2018  PMFS History: Patient Active Problem List   Diagnosis Date Noted  . Chronic left shoulder pain 01/08/2018  . Lumbar foraminal stenosis 01/08/2018  . S/P lumbar fusion 05/03/2017  . Impingement syndrome of left shoulder 05/03/2017  . S/P total knee arthroplasty, right 12/28/2016  . Right knee DJD 10/10/2016  . 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 OSA (obstructive sleep apnea) 12/09/2011  . Chronic bronchitis ([place]) 12/09/2011  . Tobacco abuse 12/09/2011   Past Medical History:  Diagnosis Date  . Bronchitis   . Hepatitis 1976   pt. doesn't remember if A, B, C?  . History of kidney stones   . Hyperlipidemia   . Hypertension   . OSA (obstructive sleep apnea) 12/09/2011   PSG 01/03/12>>AHI 45.7, SpO2 low 73%, PLMI 0.  CPAP 15 cm H2O>>AHI 0, +R.   Marland Kitchen Pneumonia 2015  . Renal disorder   . Sleep apnea    no CPAP ordered per patient    Family History  Problem Relation Age of Onset  . Heart attack Father   . Cancer Mother     Past Surgical History:  Procedure Laterality Date  . BACK SURGERY    . COLONOSCOPY    . CYSTOSCOPY WITH RETROGRADE PYELOGRAM, URETEROSCOPY AND STENT PLACEMENT Left 07/06/2017   Procedure: CYSTOSCOPY WITH LEFT RETROGRADE PYELOGRAM, URETEROSCOPY WITH  HOLMIUM LASER BASKET EXTRACTION AND STENT PLACEMENT;  Surgeon: [name], MD;  Location: [facility];  Service: Urology;  Laterality: Left;  . JOINT REPLACEMENT    . REVISION TOTAL HIP ARTHROPLASTY Left 11/2015  . TOTAL HIP ARTHROPLASTY    . TOTAL HIP REVISION Left 11/18/2015   Procedure: LEFT TOTAL HIP REVISION;  Surgeon: Marybelle Killings, MD;  Location: [facility];  Service: Orthopedics;  Laterality: Left;  . TOTAL KNEE ARTHROPLASTY Right 10/10/2016   Procedure: TOTAL KNEE ARTHROPLASTY;  Surgeon: Marybelle Killings, MD;  Location: [facility];  Service: Orthopedics;  Laterality: Right;   Social History   Occupational History  . Not on file  Tobacco Use  . Smoking status: Current Some Day Smoker    Packs/day: 0.50    Years: 46.00    Pack years: 23.00    Types: Cigarettes  . Smokeless tobacco: Never Used  . Tobacco comment: states on and off  smoker  Substance and Sexual Activity  . Alcohol use: No  . Drug use: No  . Sexual activity: Not on file

## 2018-03-27 NOTE — Addendum Note (Signed)
Addended by: Meyer Cory on: 03/27/2018 08:59 AM   Modules accepted: Orders

## 2018-03-28 ENCOUNTER — Telehealth (INDEPENDENT_AMBULATORY_CARE_PROVIDER_SITE_OTHER): Payer: Self-pay | Admitting: *Deleted

## 2018-03-28 NOTE — Telephone Encounter (Signed)
Error- please disregard

## 2018-04-04 ENCOUNTER — Telehealth (INDEPENDENT_AMBULATORY_CARE_PROVIDER_SITE_OTHER): Payer: Self-pay | Admitting: Orthopaedic Surgery

## 2018-04-04 NOTE — Telephone Encounter (Signed)
03/27/18 ov note faxed to [facility] [phone]

## 2018-04-10 ENCOUNTER — Ambulatory Visit
Admission: RE | Admit: 2018-04-10 | Discharge: 2018-04-10 | Disposition: A | Discharge status: Home / Self Care | Payer: Medicare Other | Source: Ambulatory Visit | Attending: Orthopaedic Surgery | Admitting: Orthopaedic Surgery

## 2018-04-10 DIAGNOSIS — M47812 Spondylosis without myelopathy or radiculopathy, cervical region: Secondary | ICD-10-CM

## 2018-04-20 ENCOUNTER — Ambulatory Visit (INDEPENDENT_AMBULATORY_CARE_PROVIDER_SITE_OTHER): Payer: Medicare Other | Admitting: Orthopaedic Surgery

## 2018-04-20 ENCOUNTER — Encounter (INDEPENDENT_AMBULATORY_CARE_PROVIDER_SITE_OTHER): Payer: Self-pay | Admitting: Orthopaedic Surgery

## 2018-04-20 VITALS — BP 124/74 | HR 67 | Ht 68.0 in | Wt 254.0 lb

## 2018-04-20 DIAGNOSIS — M4802 Spinal stenosis, cervical region: Secondary | ICD-10-CM

## 2018-04-20 NOTE — Progress Notes (Signed)
Office Visit Note   Patient: Peter Manning           Date of Birth: May 16, 1956           MRN: [id number] Visit Date: 04/20/2018              Requested by: Nolene Ebbs, MD [address] [place], [place] [address] PCP: Nolene Ebbs, MD   Assessment & Plan: Visit Diagnoses:  1. Spinal stenosis of cervical region   2. Foraminal stenosis of cervical region     Plan: Patient had some past elevated glucose readings.  He needs to see Dr. Luretha Rued to get this evaluated.  He has not had an A1c and may need to be on some oral medication.  I plan to check him back again in a month he was having persistent symptoms we can discussed operative intervention which would be to level cervical fusion at C5-6 and C6-7 with outpatient procedure and overnight stay, use of a soft collar x6 weeks.  Follow-Up Instructions: Return in about 1 month (around 05/21/2018).   Orders:  No orders of the defined types were placed in this encounter.  No orders of the defined types were placed in this encounter.     Procedures: No procedures performed   Clinical Data: No additional findings.   Subjective: Chief Complaint  Patient presents with  . Neck - Pain, Follow-up    MRI Cervical Spine Review    HPI 62 year old male returns post cervical MRI scan for persistent problems with neck pain greater than 6 months with pain that radiates in both arms left hand numbness.  He had known specific injury.  By plain radiograph he does have autologous fusion at C3-4 by plain radiographs.  Previous lumbar fusion with instrumentation and hip arthroplasty on the left.  Review of Systems 14 point review of system positive for right total knee arthroplasty.  History of lumbar fusion L5-S1 later L4-S1 fusion.  Left total hip arthroplasty with later revision approximately 10 years later for poly-wear with poly-exchange doing well.  Chronic bronchitis chronic of sleep obstructive sleep apnea.  Chronic pain management  contract on Percocet 10/325 followed elsewhere.  Otherwise negative as it pertains HPI   Objective: Vital Signs: BP 124/74   Pulse 67   Ht 5\' 8"  (1.727 m)   Wt 254 lb (115.2 kg)   BMI 38.62 kg/m   Physical Exam  Constitutional: He is oriented to person, place, and time. He appears well-developed and well-nourished.  HENT:  Head: Normocephalic and atraumatic.  Eyes: Pupils are equal, round, and reactive to light. EOM are normal.  Neck: No tracheal deviation present. No thyromegaly present.  Cardiovascular: Normal rate.  Pulmonary/Chest: Effort normal. He has no wheezes.  Abdominal: Soft. Bowel sounds are normal.  Neurological: He is alert and oriented to person, place, and time.  Skin: Skin is warm and dry. Capillary refill takes less than 2 seconds.  Psychiatric: He has a normal mood and affect. His behavior is normal. Judgment and thought content normal.    Ortho Exam well-healed lumbar incision right total knee arthroplasty left total hip arthroplasty incision.  He has some brachial plexus tenderness pain with rotation of his neck increased pain with cervical compression some improvement with distraction.  Upper extremity reflexes are 2+.  Wrist pulses are normal.  Negative lower extremity clonus no hyperreflexia.  Well-healed lumbar incision.  Specialty Comments:  No specialty comments available.  Imaging:  CLINICAL DATA:  Neck pain and stiffness for 6  months radiating into both arms. Left hand numbness. No known injury.  EXAM: MRI CERVICAL SPINE WITHOUT CONTRAST  TECHNIQUE: Multiplanar, multisequence MR imaging of the cervical spine was performed. No intravenous contrast was administered.  COMPARISON:  Plain film cervical spine from [facility] 01/24/2018.  FINDINGS: Alignment: Maintained with straightening of lordosis noted.  Vertebrae: There is some degenerative endplate signal change at C5-6 and C6-7.  Cord: Normal signal  throughout.  Posterior Fossa, vertebral arteries, paraspinal tissues: Negative.  Disc levels:  C1-2: There is some degenerative change about the articulations of the lateral masses with subchondral cyst formation bilaterally and some edema in the right lateral mass of C1.  C2-3: Mild posterior bony ridging is present. Facet degenerative disease is worse on the left. The ventral thecal sac is narrowed but not effaced. Moderate bilateral foraminal narrowing is present.  C3-4: Autologous fusion across the disc interspace identified. Left worse than right facet arthropathy is present. Small central endplate spur and bilateral uncovertebral disease are seen. The ventral thecal sac is narrowed but not effaced. Moderate to moderately severe foraminal narrowing is worse on the left.  C4-5: Shallow disc bulge, uncovertebral disease and left worse than right facet degenerative change. The ventral thecal sac is nearly effaced. Moderate to moderately severe foraminal narrowing is worse on the left.  C5-6: Shallow disc bulge, endplate spur and left worse than right facet arthropathy and uncovertebral degeneration are seen. The ventral thecal sac is nearly effaced. Moderate to moderately severe left and mild-to-moderate right foraminal narrowing is present.  C6-7: Disc bulge present broad-based disc bulge and uncovertebral disease are seen. The ventral thecal sac is nearly effaced. Moderately to moderately severe foraminal narrowing is worse on the left.  C7-T1: There is some facet degenerative disease, more notable on the left. The central canal and foramina are open.  IMPRESSION: Degenerative change about the lateral masses at C1-2 appears worse on the right.  Moderate bilateral foraminal narrowing C2-3.  Autologous fusion across the C3-4 disc interspace. Moderate to moderately severe foraminal narrowing at this level is worse on the left and the ventral thecal sac is  narrowed but not effaced by central endplate spur.  Moderate to moderately severe bilateral foraminal narrowing C4-5 is worse on the left. The ventral thecal sac is nearly effaced.  The ventral thecal sac is nearly effaced at C5-6 where moderate to moderately severe left and mild-to-moderate right foraminal narrowing is seen.  Moderate to moderately severe bilateral foraminal narrowing at C6-7 is worse on the left. The ventral thecal sac is nearly effaced at this level.   Electronically Signed   By: Inge Rise M.D.   On: 04/10/2018 13:57  PMFS History: Patient Active Problem List   Diagnosis Date Noted  . Chronic left shoulder pain 01/08/2018  . Lumbar foraminal stenosis 01/08/2018  . S/P lumbar fusion 05/03/2017  . Impingement syndrome of left shoulder 05/03/2017  . S/P total knee arthroplasty, right 12/28/2016  . Right knee DJD 10/10/2016  . 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 OSA (obstructive sleep apnea) 12/09/2011  . Chronic bronchitis ([place]) 12/09/2011  . Tobacco abuse 12/09/2011   Past Medical History:  Diagnosis Date  . Bronchitis   . Hepatitis 1976   pt. doesn't remember if A, B, C?  . History of kidney stones   . Hyperlipidemia   . Hypertension   . OSA (obstructive sleep apnea) 12/09/2011   PSG 01/03/12>>AHI 45.7, SpO2 low 73%, PLMI 0.  CPAP 15 cm H2O>>AHI 0, +R.   Marland Kitchen  Pneumonia 2015  . Renal disorder   . Sleep apnea    no CPAP ordered per patient    Family History  Problem Relation Age of Onset  . Heart attack Father   . Cancer Mother     Past Surgical History:  Procedure Laterality Date  . BACK SURGERY    . COLONOSCOPY    . CYSTOSCOPY WITH RETROGRADE PYELOGRAM, URETEROSCOPY AND STENT PLACEMENT Left 07/06/2017   Procedure: CYSTOSCOPY WITH LEFT RETROGRADE PYELOGRAM, URETEROSCOPY WITH HOLMIUM LASER BASKET EXTRACTION AND STENT PLACEMENT;  Surgeon: [name], MD;  Location: [facility];  Service: Urology;  Laterality: Left;  .  JOINT REPLACEMENT    . REVISION TOTAL HIP ARTHROPLASTY Left 11/2015  . TOTAL HIP ARTHROPLASTY    . TOTAL HIP REVISION Left 11/18/2015   Procedure: LEFT TOTAL HIP REVISION;  Surgeon: Marybelle Killings, MD;  Location: [place];  Service: Orthopedics;  Laterality: Left;  . TOTAL KNEE ARTHROPLASTY Right 10/10/2016   Procedure: TOTAL KNEE ARTHROPLASTY;  Surgeon: Marybelle Killings, MD;  Location: [place];  Service: Orthopedics;  Laterality: Right;   Social History   Occupational History  . Not on file  Tobacco Use  . Smoking status: Current Some Day Smoker    Packs/day: 0.50    Years: 46.00    Pack years: 23.00    Types: Cigarettes  . Smokeless tobacco: Never Used  . Tobacco comment: states on and off smoker  Substance and Sexual Activity  . Alcohol use: No  . Drug use: No  . Sexual activity: Not on file

## 2018-05-18 ENCOUNTER — Ambulatory Visit (INDEPENDENT_AMBULATORY_CARE_PROVIDER_SITE_OTHER): Payer: Medicare Other | Admitting: Orthopaedic Surgery

## 2018-06-09 ENCOUNTER — Emergency Department (HOSPITAL_COMMUNITY)
Admission: EM | Admit: 2018-06-09 | Discharge: 2018-06-09 | Disposition: A | Discharge status: Home / Self Care | Payer: Medicare Other | Attending: Emergency Medicine | Admitting: Emergency Medicine

## 2018-06-09 ENCOUNTER — Emergency Department (HOSPITAL_COMMUNITY): Payer: Medicare Other

## 2018-06-09 DIAGNOSIS — W1830XA Fall on same level, unspecified, initial encounter: Secondary | ICD-10-CM | POA: Insufficient documentation

## 2018-06-09 DIAGNOSIS — R51 Headache: Secondary | ICD-10-CM | POA: Insufficient documentation

## 2018-06-09 DIAGNOSIS — Y939 Activity, unspecified: Secondary | ICD-10-CM | POA: Insufficient documentation

## 2018-06-09 DIAGNOSIS — Y999 Unspecified external cause status: Secondary | ICD-10-CM | POA: Diagnosis not present

## 2018-06-09 DIAGNOSIS — I10 Essential (primary) hypertension: Secondary | ICD-10-CM | POA: Diagnosis not present

## 2018-06-09 DIAGNOSIS — R519 Headache, unspecified: Secondary | ICD-10-CM

## 2018-06-09 DIAGNOSIS — M5412 Radiculopathy, cervical region: Secondary | ICD-10-CM | POA: Insufficient documentation

## 2018-06-09 DIAGNOSIS — W19XXXA Unspecified fall, initial encounter: Secondary | ICD-10-CM

## 2018-06-09 DIAGNOSIS — Z79899 Other long term (current) drug therapy: Secondary | ICD-10-CM | POA: Diagnosis not present

## 2018-06-09 DIAGNOSIS — F17210 Nicotine dependence, cigarettes, uncomplicated: Secondary | ICD-10-CM | POA: Insufficient documentation

## 2018-06-09 DIAGNOSIS — Y929 Unspecified place or not applicable: Secondary | ICD-10-CM | POA: Diagnosis not present

## 2018-06-09 LAB — CBC WITH DIFFERENTIAL/PLATELET
Abs Immature Granulocytes: 0.04 10*3/uL (ref 0.00–0.07)
Basophils Absolute: 0.1 10*3/uL (ref 0.0–0.1)
Basophils Relative: 1 %
Eosinophils Absolute: 0.2 10*3/uL (ref 0.0–0.5)
Eosinophils Relative: 2 %
HCT: 40.5 % (ref 39.0–52.0)
Hemoglobin: 13 g/dL (ref 13.0–17.0)
Immature Granulocytes: 0 %
Lymphocytes Relative: 16 %
Lymphs Abs: 1.5 10*3/uL (ref 0.7–4.0)
MCH: 31.4 pg (ref 26.0–34.0)
MCHC: 32.1 g/dL (ref 30.0–36.0)
MCV: 97.8 fL (ref 80.0–100.0)
MONO ABS: 0.5 10*3/uL (ref 0.1–1.0)
Monocytes Relative: 5 %
Neutro Abs: 7.2 10*3/uL (ref 1.7–7.7)
Neutrophils Relative %: 76 %
Platelets: 251 10*3/uL (ref 150–400)
RBC: 4.14 MIL/uL — ABNORMAL LOW (ref 4.22–5.81)
RDW: 12 % (ref 11.5–15.5)
WBC: 9.6 10*3/uL (ref 4.0–10.5)
nRBC: 0 % (ref 0.0–0.2)

## 2018-06-09 LAB — I-STAT CHEM 8, ED
BUN: 9 mg/dL (ref 8–23)
Calcium, Ion: 1.2 mmol/L (ref 1.15–1.40)
Chloride: 101 mmol/L (ref 98–111)
Creatinine, Ser: 0.9 mg/dL (ref 0.61–1.24)
Glucose, Bld: 204 mg/dL — ABNORMAL HIGH (ref 70–99)
HCT: 41 % (ref 39.0–52.0)
Hemoglobin: 13.9 g/dL (ref 13.0–17.0)
Potassium: 3.2 mmol/L — ABNORMAL LOW (ref 3.5–5.1)
Sodium: 139 mmol/L (ref 135–145)
TCO2: 30 mmol/L (ref 22–32)

## 2018-06-09 MED ORDER — DIPHENHYDRAMINE HCL 50 MG/ML IJ SOLN
25.0000 mg | Freq: Once | INTRAMUSCULAR | Status: AC
  Administered 2018-06-09: 25 mg via INTRAVENOUS
  Filled 2018-06-09: qty 1

## 2018-06-09 MED ORDER — DEXAMETHASONE SODIUM PHOSPHATE 10 MG/ML IJ SOLN
10.0000 mg | Freq: Once | INTRAMUSCULAR | Status: AC
  Administered 2018-06-09: 10 mg via INTRAVENOUS
  Filled 2018-06-09: qty 1

## 2018-06-09 MED ORDER — KETOROLAC TROMETHAMINE 15 MG/ML IJ SOLN
15.0000 mg | Freq: Once | INTRAMUSCULAR | Status: AC
  Administered 2018-06-09: 15 mg via INTRAVENOUS
  Filled 2018-06-09: qty 1

## 2018-06-09 MED ORDER — METOCLOPRAMIDE HCL 5 MG/ML IJ SOLN
10.0000 mg | Freq: Once | INTRAMUSCULAR | Status: AC
  Administered 2018-06-09: 10 mg via INTRAVENOUS
  Filled 2018-06-09: qty 2

## 2018-06-09 NOTE — ED Triage Notes (Signed)
Pt arrived via pov from home c/o numbness in left hand x2 weeks. Pt also c/o bad headache x2 days. Pt states he attempted to stand from the seated position today and fell to his right side. He was Unsure if he struck his head. Pt has small abrasion to right forearm. Pt is alert and oriented at time of triage.

## 2018-06-09 NOTE — ED Provider Notes (Signed)
[facility] Provider Note   CSN: [id number] Arrival date & time: 06/09/18  1913     History   Chief Complaint Chief Complaint  Patient presents with  . Fall    HPI Peter Manning is a 62 y.o. male.  Patient is a 62 year old male with a history of hypertension, hyperlipidemia, chronic pain syndrome from recurrent back surgeries including a back stimulator who is presenting today with 2 weeks of worsening headaches that are present every day all day long and not worsening particular time of the day.  He does state in the last 2 days the headache has become more throbbing and he describes it in the whole front of his face.  He has had no vision changes, difficulty with speech, sinus congestion or fever.  He has chronic neck pain with a history of spinal stenosis.  He states for months now he has had intermittent tingling of his left upper extremity and saw Dr. Lorin Mercy who was concerned the patient may need surgery.  He has been taking his blood pressure medication as prescribed and has not missed any doses.  Today he was sitting in a chair and when he went to stand up he said he felt slightly lightheaded and fell to his right side to the floor.  They deny any loss of consciousness or confusion during the event.  He was able to get up and sit back in a chair but his wife states he was just very quiet for a while.  He denied any chest pain, abdominal pain, shortness of breath, nausea or vomiting.  She states this is never happened to him before but she was concerned because of the headaches.  He does not take anticoagulation.  The history is provided by the patient and the spouse.  Fall  This is a new problem. The current episode started 1 to 2 hours ago. The problem occurs constantly. The problem has been resolved. Associated symptoms include headaches. Pertinent negatives include no chest pain, no abdominal pain and no shortness of breath. Nothing aggravates  the symptoms. Nothing relieves the symptoms. He has tried nothing for the symptoms. The treatment provided no relief.    Past Medical History:  Diagnosis Date  . Bronchitis   . Hepatitis 1976   pt. doesn't remember if A, B, C?  . History of kidney stones   . Hyperlipidemia   . Hypertension   . OSA (obstructive sleep apnea) 12/09/2011   PSG 01/03/12>>AHI 45.7, SpO2 low 73%, PLMI 0.  CPAP 15 cm H2O>>AHI 0, +R.   Marland Kitchen Pneumonia 2015  . Renal disorder   . Sleep apnea    no CPAP ordered per patient    Patient Active Problem List   Diagnosis Date Noted  . Spinal stenosis of cervical region 04/20/2018  . Foraminal stenosis of cervical region 04/20/2018  . Chronic left shoulder pain 01/08/2018  . Lumbar foraminal stenosis 01/08/2018  . S/P lumbar fusion 05/03/2017  . Impingement syndrome of left shoulder 05/03/2017  . S/P total knee arthroplasty, right 12/28/2016  . Right knee DJD 10/10/2016  . 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 OSA (obstructive sleep apnea) 12/09/2011  . Chronic bronchitis ([place]) 12/09/2011  . Tobacco abuse 12/09/2011    Past Surgical History:  Procedure Laterality Date  . BACK SURGERY    . COLONOSCOPY    . CYSTOSCOPY WITH RETROGRADE PYELOGRAM, URETEROSCOPY AND STENT PLACEMENT Left 07/06/2017   Procedure: CYSTOSCOPY WITH LEFT RETROGRADE PYELOGRAM, URETEROSCOPY WITH  HOLMIUM LASER BASKET EXTRACTION AND STENT PLACEMENT;  Surgeon: [name], MD;  Location: [facility];  Service: Urology;  Laterality: Left;  . JOINT REPLACEMENT    . REVISION TOTAL HIP ARTHROPLASTY Left 11/2015  . TOTAL HIP ARTHROPLASTY    . TOTAL HIP REVISION Left 11/18/2015   Procedure: LEFT TOTAL HIP REVISION;  Surgeon: Marybelle Killings, MD;  Location: [place];  Service: Orthopedics;  Laterality: Left;  . TOTAL KNEE ARTHROPLASTY Right 10/10/2016   Procedure: TOTAL KNEE ARTHROPLASTY;  Surgeon: Marybelle Killings, MD;  Location: [place];  Service: Orthopedics;  Laterality: Right;        Home  Medications    Prior to Admission medications   Medication Sig Start Date End Date Taking? Authorizing Provider  albuterol (PROVENTIL HFA;VENTOLIN HFA) 108 (90 BASE) MCG/ACT inhaler Inhale 1-2 puffs into the lungs every 6 (six) hours as needed for wheezing or shortness of breath. 04/17/14   Hyman Bible, PA-C  albuterol (PROVENTIL HFA;VENTOLIN HFA) 108 (90 Base) MCG/ACT inhaler Inhale 2 puffs into the lungs every 4 (four) hours as needed for wheezing or shortness of breath. 07/19/17   Lacretia Leigh, MD  amLODipine (NORVASC) 10 MG tablet Take 10 mg by mouth daily.  08/24/16   [provider]  aspirin EC 325 MG EC tablet Take 1 tablet (325 mg total) by mouth daily with breakfast. 10/12/16   Lanae Crumbly, PA-C  azithromycin (ZITHROMAX) 250 MG tablet Take 1 tablet (250 mg total) by mouth daily. Take first 2 tablets together, then 1 every day until finished. Patient not taking: Reported on 03/27/2018 08/01/17   Fransico Meadow, PA-C  doxycycline (VIBRAMYCIN) 100 MG capsule Take 1 capsule (100 mg total) by mouth 2 (two) times daily. One po bid x 7 days Patient not taking: Reported on 03/27/2018 10/17/17   Etta Quill, NP  oxyCODONE-acetaminophen (PERCOCET) 10-325 MG tablet Take 1 tablet by mouth every 6 (six) hours as needed for pain. 07/06/17   [name], MD  phenazopyridine (PYRIDIUM) 200 MG tablet Take 1 tablet (200 mg total) by mouth 3 (three) times daily as needed for pain. 07/06/17   [name], MD  polyethylene glycol [facility] / Floria Raveling) packet Take 17 g by mouth daily as needed for mild constipation. 10/12/16   Lanae Crumbly, PA-C  potassium chloride SA (K-DUR,KLOR-CON) 20 MEQ tablet Take 1 tablet (20 mEq total) by mouth daily. 12/04/16   Julianne Rice, MD  predniSONE (STERAPRED UNI-PAK 21 TAB) 10 MG (21) TBPK tablet Take by mouth daily. Take 6 tabs by mouth daily  for 2 days, then 5 tabs for 2 days, then 4 tabs for 2 days, then 3 tabs for 2 days, 2 tabs for 2 days, then 1 tab by mouth  daily for 2 days Patient not taking: Reported on 03/27/2018 07/19/17   Lacretia Leigh, MD    Family History Family History  Problem Relation Age of Onset  . Heart attack Father   . Cancer Mother     Social History Social History   Tobacco Use  . Smoking status: Current Some Day Smoker    Packs/day: 0.50    Years: 46.00    Pack years: 23.00    Types: Cigarettes  . Smokeless tobacco: Never Used  . Tobacco comment: states on and off smoker  Substance Use Topics  . Alcohol use: No  . Drug use: No     Allergies   Patient has no known allergies.   Review of Systems  Review of Systems  Respiratory: Negative for shortness of breath.   Cardiovascular: Negative for chest pain.  Gastrointestinal: Negative for abdominal pain.  Neurological: Positive for headaches.  All other systems reviewed and are negative.    Physical Exam Updated Vital Signs BP (!) 142/73 (BP Location: Right Arm)   Pulse 74   Temp 98.3 F (36.8 C) (Oral)   Resp 17   SpO2 96%   Physical Exam  Constitutional: He is oriented to person, place, and time. He appears well-developed and well-nourished. No distress.  HENT:  Head: Normocephalic and atraumatic.  Mouth/Throat: Oropharynx is clear and moist.  Eyes: Pupils are equal, round, and reactive to light. Conjunctivae and EOM are normal.  Neck: Normal range of motion. Neck supple. Spinous process tenderness and muscular tenderness present. Normal range of motion present.  Cardiovascular: Normal rate, regular rhythm and intact distal pulses.  No murmur heard. Pulmonary/Chest: Effort normal and breath sounds normal. No respiratory distress. He has no wheezes. He has no rales.  Abdominal: Soft. He exhibits no distension. There is no tenderness. There is no rebound and no guarding.  Musculoskeletal: Normal range of motion. He exhibits no edema or tenderness.  Neurological: He is alert and oriented to person, place, and time.  5/5 grip strength bilaterally.   Normal bicep and tricep strength bilaterally.  Mild decreased sensation subjectively over the left fingers but none in the upper arm.  5 out of 5 strength in bilateral lower extremities.  No facial droop or speech deficits.  No pronator drift noted.  No visual field cuts.  Skin: Skin is warm and dry. Capillary refill takes less than 2 seconds. No rash noted. No erythema.  Psychiatric: He has a normal mood and affect. His behavior is normal.  Nursing note and vitals reviewed.    ED Treatments / Results  Labs (all labs ordered are listed, but only abnormal results are displayed) Labs Reviewed  CBC WITH DIFFERENTIAL/PLATELET - Abnormal; Notable for the following components:      Result Value   RBC 4.14 (*)    All other components within normal limits  I-STAT CHEM 8, ED - Abnormal; Notable for the following components:   Potassium 3.2 (*)    Glucose, Bld 204 (*)    All other components within normal limits  CBC WITH DIFFERENTIAL/PLATELET    EKG EKG Interpretation  Date/Time:  Saturday June 09 2018 19:22:07 EST Ventricular Rate:  72 PR Interval:    QRS Duration: 98 QT Interval:  395 QTC Calculation: 433 R Axis:   50 Text Interpretation:  Sinus rhythm No significant change since last tracing Confirmed by Blanchie Dessert [phone]) on 06/09/2018 7:39:47 PM   Radiology Ct Head Wo Contrast  Result Date: 06/09/2018 CLINICAL DATA:  Per ed notes: Pt arrived via pov from home c/o numbness in left hand x2 weeks. Pt also c/o bad headache x2 days. Pt states he attempted to stand from the seated position today and fell to his right sideHeadache, chronic, neuro deficit; Radiculopathy EXAM: CT HEAD WITHOUT CONTRAST CT CERVICAL SPINE WITHOUT CONTRAST TECHNIQUE: Multidetector CT imaging of the head and cervical spine was performed following the standard protocol without intravenous contrast. Multiplanar CT image reconstructions of the cervical spine were also generated. COMPARISON:  Head CT  06/22/2017 FINDINGS: CT HEAD FINDINGS Brain: No acute intracranial hemorrhage. No focal mass lesion. No CT evidence of acute infarction. No midline shift or mass effect. No hydrocephalus. Basilar cisterns are patent. Vascular: No hyperdense vessel or unexpected calcification.  Skull: Normal. Negative for fracture or focal lesion. Sinuses/Orbits: Opacification of the LEFT sphenoid hemi sinus. Mastoid air cells clear Other: None. CT CERVICAL SPINE FINDINGS Alignment: Straightened normal cervical lordosis. Skull base and vertebrae: Normal craniocervical junction. No loss of vertebral body height or disc height. Normal facet articulation. No evidence of fracture. Soft tissues and spinal canal: No prevertebral soft tissue swelling. No perispinal or epidural hematoma. Disc levels:  Unremarkable Upper chest: There is fusion of the C3-C4 vertebral bodies. Osteophytosis anteriorly from C 4 to C7. Other: None IMPRESSION: 1. No intracranial trauma. 2. No cervical spine fracture. 3. Straightening of the normal cervical lordosis may be secondary to position, muscle spasm, or ligamentous injury. Electronically Signed   By: Suzy Bouchard M.D.   On: 06/09/2018 20:31   Ct Cervical Spine Wo Contrast  Result Date: 06/09/2018 CLINICAL DATA:  Per ed notes: Pt arrived via pov from home c/o numbness in left hand x2 weeks. Pt also c/o bad headache x2 days. Pt states he attempted to stand from the seated position today and fell to his right sideHeadache, chronic, neuro deficit; Radiculopathy EXAM: CT HEAD WITHOUT CONTRAST CT CERVICAL SPINE WITHOUT CONTRAST TECHNIQUE: Multidetector CT imaging of the head and cervical spine was performed following the standard protocol without intravenous contrast. Multiplanar CT image reconstructions of the cervical spine were also generated. COMPARISON:  Head CT 06/22/2017 FINDINGS: CT HEAD FINDINGS Brain: No acute intracranial hemorrhage. No focal mass lesion. No CT evidence of acute infarction. No  midline shift or mass effect. No hydrocephalus. Basilar cisterns are patent. Vascular: No hyperdense vessel or unexpected calcification. Skull: Normal. Negative for fracture or focal lesion. Sinuses/Orbits: Opacification of the LEFT sphenoid hemi sinus. Mastoid air cells clear Other: None. CT CERVICAL SPINE FINDINGS Alignment: Straightened normal cervical lordosis. Skull base and vertebrae: Normal craniocervical junction. No loss of vertebral body height or disc height. Normal facet articulation. No evidence of fracture. Soft tissues and spinal canal: No prevertebral soft tissue swelling. No perispinal or epidural hematoma. Disc levels:  Unremarkable Upper chest: There is fusion of the C3-C4 vertebral bodies. Osteophytosis anteriorly from C 4 to C7. Other: None IMPRESSION: 1. No intracranial trauma. 2. No cervical spine fracture. 3. Straightening of the normal cervical lordosis may be secondary to position, muscle spasm, or ligamentous injury. Electronically Signed   By: Suzy Bouchard M.D.   On: 06/09/2018 20:31    Procedures Procedures (including critical care time)  Medications Ordered in ED Medications  diphenhydrAMINE (BENADRYL) injection 25 mg (has no administration in time range)  metoCLOPramide (REGLAN) injection 10 mg (has no administration in time range)  dexamethasone (DECADRON) injection 10 mg (has no administration in time range)  ketorolac (TORADOL) 15 MG/ML injection 15 mg (has no administration in time range)     Initial Impression / Assessment and Plan / ED Course  I have reviewed the triage vital signs and the nursing notes.  Pertinent labs & imaging results that were available during my care of the patient were reviewed by me and considered in my medical decision making (see chart for details).     Patient presenting today with complaints of ongoing headache for the last 2 weeks worse in the last 2 days.  Is not associated with anything specific and patient has not taken  any medication for this.  Headaches are new he does not normally have them.  He has chronic neck pain that is worsening with intermittent tingling in the left hand.  This is been going on  for months he has a history of spinal stenosis is been seeing Dr. Lorin Mercy but states he does not want to have surgery.  Patient denies any infectious symptoms concerning for sinusitis, meningitis.  Patient's symptoms are less concerning for stroke today.  He has no focal neuro deficits on exam except for some subjective tingling in his left fingers.  He has palpable pulses in all extremities and capillary refill of less than 2 seconds.  Patient is only minimally hypertensive at 142/73.  Low suspicion that that is the cause of his headache.  He has no visual complaints concerning for glaucoma.  He does not describe the headache as worst of life suggestive of a subarachnoid hemorrhage.  We will do a CT to rule out space-occupying lesion, bleed.  Patient was given a headache cocktail.  Concerned that the headache may be triggered from his cervical pathology.  CT of the cervical spine also pending.  Patient states today when he went to stand up he did feel slightly lightheaded and fell to the floor.  He had no complaints of near syncope or syncope.  He denies any chest pain or shortness of breath.  EKG without acute findings.  Labs pending to rule out signs of anemia.  9:31 PM Cervical CT without acute findings but chronic disease, head CT was normal.  Labs with mild hypokalemia 3.2 but otherwise normal.  After headache cocktail on reevaluation pt feeling much better.  Pt able to ambulate without difficulty and d/ced home to f/u with PCP.  Final Clinical Impressions(s) / ED Diagnoses   Final diagnoses:  Intractable headache, unspecified chronicity pattern, unspecified headache type  Cervical radiculopathy  Fall, initial encounter    ED Discharge Orders    None       Blanchie Dessert, MD 06/09/18 2149

## 2018-06-09 NOTE — ED Notes (Signed)
Patient transported to CT 

## 2018-08-15 ENCOUNTER — Ambulatory Visit (AMBULATORY_SURGERY_CENTER): Payer: Self-pay

## 2018-08-15 VITALS — Ht 68.0 in | Wt 261.8 lb

## 2018-08-15 DIAGNOSIS — Z1211 Encounter for screening for malignant neoplasm of colon: Secondary | ICD-10-CM

## 2018-08-15 MED ORDER — NA SULFATE-K SULFATE-MG SULF 17.5-3.13-1.6 GM/177ML PO SOLN: 1.0000 | Freq: Once | ORAL | 0 refills | Status: AC

## 2018-08-15 NOTE — Progress Notes (Signed)
Per pt, no allergies to soy or egg products.Pt not taking any weight loss meds or using  O2 at home.  Emmi video sent to pt's email.

## 2018-08-21 ENCOUNTER — Telehealth: Payer: Self-pay | Admitting: Internal Medicine

## 2018-08-21 NOTE — Telephone Encounter (Signed)
Pt called in stating that he has been having diarrhea and bleeding from the rectum for three days. He wants to know if he should still come to the sched proc for Friday.

## 2018-08-21 NOTE — Telephone Encounter (Signed)
I spoke with patient. He states he has had diarrhea for the past 3 days, He has 3 BM's each day. Last night he had a BM and "blood came out", enough blood to cover the toilet tissue and he wiped twice per pt. Patient denies any fever and no new medications. Patient state this happened 2 years ago and he went to the ED and was told he had hemorrhoids. Patient is for direct screening colonoscopy with you on Friday. Ok to proceed? Please advise. Thank you,Vear Staton pv

## 2018-08-21 NOTE — Telephone Encounter (Signed)
Okay to proceed.  Thanks for checking

## 2018-08-21 NOTE — Telephone Encounter (Signed)
Pt notified of Dr.Perry's recommendations. Encouraged patient to watch for blood during the prep and if it's a very large amount to call the 24 hour number listed on his instructions. Patient verbalizes understanding and he states he has the prep already and his instructions.

## 2018-08-24 ENCOUNTER — Ambulatory Visit (AMBULATORY_SURGERY_CENTER): Payer: Medicare Other | Admitting: Internal Medicine

## 2018-08-24 ENCOUNTER — Encounter: Payer: Self-pay | Admitting: Internal Medicine

## 2018-08-24 VITALS — BP 125/72 | HR 86 | Temp 99.1°F | Resp 18 | Ht 68.0 in | Wt 254.0 lb

## 2018-08-24 DIAGNOSIS — Z1211 Encounter for screening for malignant neoplasm of colon: Secondary | ICD-10-CM | POA: Diagnosis not present

## 2018-08-24 DIAGNOSIS — D123 Benign neoplasm of transverse colon: Secondary | ICD-10-CM | POA: Diagnosis not present

## 2018-08-24 MED ORDER — SODIUM CHLORIDE 0.9 % IV SOLN: 500.0000 mL | Freq: Once | INTRAVENOUS | Status: DC

## 2018-08-24 NOTE — Op Note (Signed)
Peter Manning: Peter Manning Procedure Date: 08/24/2018 1:54 PM MRN: [id number] Endoscopist: Docia Chuck. Peter Manning , MD Age: 63 Referring MD:  Date of Birth: 01-09-1956 Gender: Male Account #: [id number] Procedure:                Colonoscopy with cold snare polypectomy x 1 Indications:              Screening for colorectal malignant neoplasm.                            Negative index exam at age 60 (per the patient) Medicines:                Monitored Anesthesia Care Procedure:                Pre-Anesthesia Assessment:                           - Prior to the procedure, a History and Physical                            was performed, and patient medications and                            allergies were reviewed. The patient's tolerance of                            previous anesthesia was also reviewed. The risks                            and benefits of the procedure and the sedation                            options and risks were discussed with the patient.                            All questions were answered, and informed consent                            was obtained. Prior Anticoagulants: The patient has                            taken no previous anticoagulant or antiplatelet                            agents. After reviewing the risks and benefits, the                            patient was deemed in satisfactory condition to                            undergo the procedure.                           After obtaining informed consent, the colonoscope  was passed under direct vision. Throughout the                            procedure, the patient's blood pressure, pulse, and                            oxygen saturations were monitored continuously. The                            Model CF-HQ190L [id number]) scope was introduced                            through the anus and advanced to the the cecum,   identified by appendiceal orifice and ileocecal                            valve. The ileocecal valve, appendiceal orifice,                            and rectum were photographed. The quality of the                            bowel preparation was adequate to identify polyps.                            The colonoscopy was performed without difficulty.                            The patient tolerated the procedure well. The bowel                            preparation used was SUPREP. Scope In: 2:05:20 PM Scope Out: 2:17:36 PM Scope Withdrawal Time: 0 hours 9 minutes 42 seconds  Total Procedure Duration: 0 hours 12 minutes 16 seconds  Findings:                 A 5 mm polyp was found in the transverse colon. The                            polyp was removed with a cold snare. Resection and                            retrieval were complete.                           Multiple small-mouthed diverticula were found in                            the left colon.                           Internal hemorrhoids were found during retroflexion.                           The exam was otherwise without abnormality  on                            direct and retroflexion views. Complications:            No immediate complications. Estimated blood loss:                            None. Estimated Blood Loss:     Estimated blood loss: none. Impression:               - One 5 mm polyp in the transverse colon, removed                            with a cold snare. Resected and retrieved.                           - Diverticulosis in the left colon.                           - Internal hemorrhoids.                           - The examination was otherwise normal on direct                            and retroflexion views. Recommendation:           - Repeat colonoscopy in 5 years for surveillance.                           - Patient has a contact number available for                            emergencies. The  signs and symptoms of potential                            delayed complications were discussed with the                            patient. Return to normal activities tomorrow.                            Written discharge instructions were provided to the                            patient.                           - Resume previous diet.                           - Continue present medications.                           - Await pathology results. Docia Chuck. Peter Pastor, MD 08/24/2018 2:25:33 PM This report has been signed electronically.

## 2018-08-24 NOTE — Patient Instructions (Signed)
YOU HAD AN ENDOSCOPIC PROCEDURE TODAY AT THE [facility]:   Refer to the procedure report that was given to you for any specific questions about what was found during the examination.  If the procedure report does not answer your questions, please call your gastroenterologist to clarify.  If you requested that your care partner not be given the details of your procedure findings, then the procedure report has been included in a sealed envelope for you to review at your convenience later.  YOU SHOULD EXPECT: Some feelings of bloating in the abdomen. Passage of more gas than usual.  Walking can help get rid of the air that was put into your GI tract during the procedure and reduce the bloating. If you had a lower endoscopy (such as a colonoscopy or flexible sigmoidoscopy) you may notice spotting of blood in your stool or on the toilet paper. If you underwent a bowel prep for your procedure, you may not have a normal bowel movement for a few days.  Please Note:  You might notice some irritation and congestion in your nose or some drainage.  This is from the oxygen used during your procedure.  There is no need for concern and it should clear up in a day or so.  SYMPTOMS TO REPORT IMMEDIATELY:   Following lower endoscopy (colonoscopy or flexible sigmoidoscopy):  Excessive amounts of blood in the stool  Significant tenderness or worsening of abdominal pains  Swelling of the abdomen that is new, acute  Fever of 100F or higher  For urgent or emergent issues, a gastroenterologist can be reached at any hour by calling [phone].   DIET:  We do recommend a small meal at first, but then you may proceed to your regular diet.  Drink plenty of fluids but you should avoid alcoholic beverages for 24 hours.  ACTIVITY:  You should plan to take it easy for the rest of today and you should NOT DRIVE or use heavy machinery until tomorrow (because of the sedation medicines used during the test).     FOLLOW UP: Our staff will call the number listed on your records the next business day following your procedure to check on you and address any questions or concerns that you may have regarding the information given to you following your procedure. If we do not reach you, we will leave a message.  However, if you are feeling well and you are not experiencing any problems, there is no need to return our call.  We will assume that you have returned to your regular daily activities without incident.  If any biopsies were taken you will be contacted by phone or by letter within the next 1-3 weeks.  Please call us at [phone] if you have not heard about the biopsies in 3 weeks.    SIGNATURES/CONFIDENTIALITY: You and/or your care partner have signed paperwork which will be entered into your electronic medical record.  These signatures attest to the fact that that the information above on your After Visit Summary has been reviewed and is understood.  Full responsibility of the confidentiality of this discharge information lies with you and/or your care-partner. 

## 2018-08-24 NOTE — Progress Notes (Signed)
Called to room to assist during endoscopic procedure.  Patient ID and intended procedure confirmed with present staff. Received instructions for my participation in the procedure from the performing physician.  

## 2018-08-24 NOTE — Progress Notes (Signed)
Pt's states no medical or surgical changes since previsit or office visit. 

## 2018-08-24 NOTE — Progress Notes (Signed)
A/ox3, pleased with MAC, report to RN 

## 2018-08-26 ENCOUNTER — Inpatient Hospital Stay (HOSPITAL_COMMUNITY)
Admission: EM | Admit: 2018-08-26 | Discharge: 2018-08-29 | DRG: 638 | Disposition: A | Discharge status: Home / Self Care | Payer: Medicare Other | Attending: Internal Medicine | Admitting: Internal Medicine

## 2018-08-26 ENCOUNTER — Encounter (HOSPITAL_COMMUNITY): Payer: Self-pay | Admitting: Emergency Medicine

## 2018-08-26 ENCOUNTER — Other Ambulatory Visit: Payer: Self-pay

## 2018-08-26 DIAGNOSIS — R739 Hyperglycemia, unspecified: Secondary | ICD-10-CM

## 2018-08-26 DIAGNOSIS — J449 Chronic obstructive pulmonary disease, unspecified: Secondary | ICD-10-CM | POA: Diagnosis present

## 2018-08-26 DIAGNOSIS — I10 Essential (primary) hypertension: Secondary | ICD-10-CM | POA: Diagnosis present

## 2018-08-26 DIAGNOSIS — R7989 Other specified abnormal findings of blood chemistry: Secondary | ICD-10-CM | POA: Diagnosis present

## 2018-08-26 DIAGNOSIS — K625 Hemorrhage of anus and rectum: Secondary | ICD-10-CM | POA: Diagnosis present

## 2018-08-26 DIAGNOSIS — E785 Hyperlipidemia, unspecified: Secondary | ICD-10-CM | POA: Diagnosis present

## 2018-08-26 DIAGNOSIS — Z79899 Other long term (current) drug therapy: Secondary | ICD-10-CM

## 2018-08-26 DIAGNOSIS — G4733 Obstructive sleep apnea (adult) (pediatric): Secondary | ICD-10-CM | POA: Diagnosis present

## 2018-08-26 DIAGNOSIS — E1101 Type 2 diabetes mellitus with hyperosmolarity with coma: Principal | ICD-10-CM | POA: Diagnosis present

## 2018-08-26 DIAGNOSIS — Z833 Family history of diabetes mellitus: Secondary | ICD-10-CM

## 2018-08-26 DIAGNOSIS — Z87442 Personal history of urinary calculi: Secondary | ICD-10-CM

## 2018-08-26 DIAGNOSIS — Z7951 Long term (current) use of inhaled steroids: Secondary | ICD-10-CM

## 2018-08-26 DIAGNOSIS — N179 Acute kidney failure, unspecified: Secondary | ICD-10-CM | POA: Diagnosis present

## 2018-08-26 DIAGNOSIS — F418 Other specified anxiety disorders: Secondary | ICD-10-CM | POA: Diagnosis present

## 2018-08-26 DIAGNOSIS — Z7982 Long term (current) use of aspirin: Secondary | ICD-10-CM

## 2018-08-26 DIAGNOSIS — Z8701 Personal history of pneumonia (recurrent): Secondary | ICD-10-CM

## 2018-08-26 DIAGNOSIS — E875 Hyperkalemia: Secondary | ICD-10-CM | POA: Diagnosis not present

## 2018-08-26 DIAGNOSIS — E861 Hypovolemia: Secondary | ICD-10-CM | POA: Diagnosis present

## 2018-08-26 DIAGNOSIS — J42 Unspecified chronic bronchitis: Secondary | ICD-10-CM | POA: Diagnosis not present

## 2018-08-26 DIAGNOSIS — Z87891 Personal history of nicotine dependence: Secondary | ICD-10-CM

## 2018-08-26 DIAGNOSIS — E860 Dehydration: Secondary | ICD-10-CM | POA: Diagnosis present

## 2018-08-26 LAB — URINALYSIS, ROUTINE W REFLEX MICROSCOPIC
BILIRUBIN URINE: NEGATIVE
Bacteria, UA: NONE SEEN
Glucose, UA: >=500 mg/dL — AB
Ketones, ur: NEGATIVE mg/dL
Leukocytes,Ua: NEGATIVE
Nitrite: NEGATIVE
PROTEIN: NEGATIVE mg/dL
Specific Gravity, Urine: 1.025 (ref 1.005–1.030)
pH: 5 (ref 5.0–8.0)

## 2018-08-26 LAB — COMPREHENSIVE METABOLIC PANEL
ALT: 34 U/L (ref 0–44)
AST: 27 U/L (ref 15–41)
Albumin: 4.8 g/dL (ref 3.5–5.0)
Alkaline Phosphatase: 180 U/L — ABNORMAL HIGH (ref 38–126)
Anion gap: 13 (ref 5–15)
BUN: 35 mg/dL — ABNORMAL HIGH (ref 8–23)
CHLORIDE: 91 mmol/L — AB (ref 98–111)
CO2: 22 mmol/L (ref 22–32)
Calcium: 10.3 mg/dL (ref 8.9–10.3)
Creatinine, Ser: 1.93 mg/dL — ABNORMAL HIGH (ref 0.61–1.24)
GFR calc Af Amer: 42 mL/min — ABNORMAL LOW (ref >60)
GFR, EST NON AFRICAN AMERICAN: 36 mL/min — AB (ref >60)
Glucose, Bld: 1017 mg/dL (ref 70–99)
Potassium: 6.1 mmol/L — ABNORMAL HIGH (ref 3.5–5.1)
SODIUM: 126 mmol/L — AB (ref 135–145)
Total Bilirubin: 1.2 mg/dL (ref 0.3–1.2)
Total Protein: 9.2 g/dL — ABNORMAL HIGH (ref 6.5–8.1)

## 2018-08-26 LAB — CBC
HEMATOCRIT: 51.1 % (ref 39.0–52.0)
Hemoglobin: 16.4 g/dL (ref 13.0–17.0)
MCH: 30.1 pg (ref 26.0–34.0)
MCHC: 32.1 g/dL (ref 30.0–36.0)
MCV: 93.9 fL (ref 80.0–100.0)
Platelets: 229 10*3/uL (ref 150–400)
RBC: 5.44 MIL/uL (ref 4.22–5.81)
RDW: 11.6 % (ref 11.5–15.5)
WBC: 9.3 10*3/uL (ref 4.0–10.5)
nRBC: 0 % (ref 0.0–0.2)

## 2018-08-26 LAB — POCT I-STAT EG7
ACID-BASE EXCESS: 2 mmol/L (ref 0.0–2.0)
Bicarbonate: 28.7 mmol/L — ABNORMAL HIGH (ref 20.0–28.0)
Calcium, Ion: 1.24 mmol/L (ref 1.15–1.40)
HCT: 50 % (ref 39.0–52.0)
Hemoglobin: 17 g/dL (ref 13.0–17.0)
O2 Saturation: 60 %
PO2 VEN: 33 mmHg (ref 32.0–45.0)
Potassium: 6.7 mmol/L (ref 3.5–5.1)
Sodium: 130 mmol/L — ABNORMAL LOW (ref 135–145)
TCO2: 30 mmol/L (ref 22–32)
pCO2, Ven: 48.7 mmHg (ref 44.0–60.0)
pH, Ven: 7.378 (ref 7.250–7.430)

## 2018-08-26 LAB — CBG MONITORING, ED: Glucose-Capillary: >600 mg/dL (ref 70–99)

## 2018-08-26 LAB — LIPASE, BLOOD: Lipase: 59 U/L — ABNORMAL HIGH (ref 11–51)

## 2018-08-26 MED ORDER — OXYCODONE-ACETAMINOPHEN 10-325 MG PO TABS: 1.0000 | ORAL_TABLET | Freq: Three times a day (TID) | ORAL | Status: DC | PRN

## 2018-08-26 MED ORDER — ALBUTEROL SULFATE (2.5 MG/3ML) 0.083% IN NEBU: 2.5000 mg | INHALATION_SOLUTION | Freq: Four times a day (QID) | RESPIRATORY_TRACT | Status: DC | PRN

## 2018-08-26 MED ORDER — INSULIN REGULAR(HUMAN) IN NACL 100-0.9 UT/100ML-% IV SOLN: INTRAVENOUS | Status: DC

## 2018-08-26 MED ORDER — SODIUM CHLORIDE 0.9% FLUSH: 3.0000 mL | Freq: Once | INTRAVENOUS | Status: DC

## 2018-08-26 MED ORDER — SODIUM CHLORIDE 0.9 % IV BOLUS
1000.0000 mL | Freq: Once | INTRAVENOUS | Status: AC
  Administered 2018-08-26: 1000 mL via INTRAVENOUS

## 2018-08-26 MED ORDER — DEXTROSE-NACL 5-0.45 % IV SOLN
INTRAVENOUS | Status: DC
  Administered 2018-08-27: 08:00:00 via INTRAVENOUS

## 2018-08-26 MED ORDER — SODIUM CHLORIDE 0.9 % IV SOLN: INTRAVENOUS | Status: DC

## 2018-08-26 MED ORDER — SODIUM CHLORIDE 0.9 % IV SOLN
INTRAVENOUS | Status: AC
  Administered 2018-08-27: 06:00:00 via INTRAVENOUS

## 2018-08-26 MED ORDER — CALCIUM GLUCONATE 10 % IV SOLN: 1.0000 g | Freq: Once | INTRAVENOUS | Status: DC

## 2018-08-26 MED ORDER — OXYCODONE HCL 5 MG PO TABS: 5.0000 mg | ORAL_TABLET | Freq: Three times a day (TID) | ORAL | Status: DC | PRN

## 2018-08-26 MED ORDER — ALBUTEROL SULFATE (2.5 MG/3ML) 0.083% IN NEBU
10.0000 mg | INHALATION_SOLUTION | Freq: Once | RESPIRATORY_TRACT | Status: AC
  Administered 2018-08-27: 10 mg via RESPIRATORY_TRACT
  Filled 2018-08-26: qty 12

## 2018-08-26 MED ORDER — OXYCODONE-ACETAMINOPHEN 5-325 MG PO TABS: 1.0000 | ORAL_TABLET | Freq: Three times a day (TID) | ORAL | Status: DC | PRN

## 2018-08-26 MED ORDER — HEPARIN SODIUM (PORCINE) 5000 UNIT/ML IJ SOLN
5000.0000 [IU] | Freq: Three times a day (TID) | INTRAMUSCULAR | Status: DC
  Administered 2018-08-27 – 2018-08-29 (×8): 5000 [IU] via SUBCUTANEOUS
  Filled 2018-08-26 (×7): qty 1

## 2018-08-26 MED ORDER — AMLODIPINE BESYLATE 10 MG PO TABS
10.0000 mg | ORAL_TABLET | Freq: Every day | ORAL | Status: DC
  Administered 2018-08-27 – 2018-08-29 (×3): 10 mg via ORAL
  Filled 2018-08-26 (×3): qty 1

## 2018-08-26 MED ORDER — DEXTROSE-NACL 5-0.45 % IV SOLN: INTRAVENOUS | Status: DC

## 2018-08-26 MED ORDER — SODIUM CHLORIDE 0.9 % IV BOLUS
500.0000 mL | Freq: Once | INTRAVENOUS | Status: AC
  Administered 2018-08-27: 500 mL via INTRAVENOUS

## 2018-08-26 MED ORDER — INSULIN REGULAR(HUMAN) IN NACL 100-0.9 UT/100ML-% IV SOLN
INTRAVENOUS | Status: DC
  Administered 2018-08-27: 5.4 [IU]/h via INTRAVENOUS
  Administered 2018-08-27: 7.4 [IU]/h via INTRAVENOUS
  Filled 2018-08-26 (×4): qty 100

## 2018-08-26 MED ORDER — MOMETASONE FURO-FORMOTEROL FUM 200-5 MCG/ACT IN AERO
2.0000 | INHALATION_SPRAY | Freq: Two times a day (BID) | RESPIRATORY_TRACT | Status: DC
  Administered 2018-08-27 – 2018-08-29 (×5): 2 via RESPIRATORY_TRACT
  Filled 2018-08-26: qty 8.8

## 2018-08-26 NOTE — ED Triage Notes (Signed)
C/o generalized abd pain, nausea, vomiting, and diarrhea since colonoscopy on Friday.

## 2018-08-26 NOTE — ED Notes (Signed)
Family member reports that his sugar is high  coughing

## 2018-08-26 NOTE — H&P (Signed)
History and Physical    Ayinde Swim XTK:[id number] DOB: [birth year] DOA: 08/26/2018  PCP: Nolene Ebbs, MD   Patient coming from: Home   Chief Complaint: Abdominal discomfort, malaise, N/V   HPI: Peter Manning is a 63 y.o. male with medical history significant for COPD, OSA, hypertension, and depression with anxiety, now presenting to the emergency department for evaluation of polyuria, polydipsia, abdominal discomfort, nausea with nonbloody vomiting, and general malaise.  Patient reports insidious development of polydipsia and polyuria over the past week or so, developed nausea with some abdominal discomfort and general malaise 3 days ago, and his condition worsened today with recurrent nonbloody vomiting.  He underwent colonoscopy with polypectomy on 08/24/2018, tolerated the procedure well, has had some scant rectal bleeding since then.  He has generalized abdominal discomfort, but no pain per se.  Denies fevers or chills but reports that his chronic cough has been worse for the past few days, productive of thick sputum, and with mild dyspnea.  Denies chest pain or palpitations and denies swelling or tenderness in the lower extremities.  ED Course: Upon arrival to the ED, patient is found to be afebrile, saturating adequately on room air, and with vitals otherwise normal.  EKG features a sinus rhythm.  Chemistry panel is concerning for glucose of 1017, potassium 6.1, and creatinine 1.93, up from 0.9 in December.  CBC is unremarkable.  Urinalysis is negative for ketones.  Patient was given a liter of normal saline and started on insulin infusion in the ED.  He remains hemodynamically stable and will be observed for further evaluation and management.  Review of Systems:  All other systems reviewed and apart from HPI, are negative.  Past Medical History:  Diagnosis Date  . Bronchitis   . Depression   . Hepatitis 1976   pt. doesn't remember if A, B, C?  . History of kidney stones   .  Hyperlipidemia   . Hypertension   . OSA (obstructive sleep apnea) 12/09/2011   PSG 01/03/12>>AHI 45.7, SpO2 low 73%, PLMI 0.  CPAP 15 cm H2O>>AHI 0, +R.   Marland Kitchen Pneumonia 2015  . Renal disorder    had kidney stone  . Sleep apnea    no CPAP ordered per patient/over 3 years    Past Surgical History:  Procedure Laterality Date  . BACK SURGERY  2001   put in screws per pt.  . COLONOSCOPY    . CYSTOSCOPY WITH RETROGRADE PYELOGRAM, URETEROSCOPY AND STENT PLACEMENT Left 07/06/2017   Procedure: CYSTOSCOPY WITH LEFT RETROGRADE PYELOGRAM, URETEROSCOPY WITH HOLMIUM LASER BASKET EXTRACTION AND STENT PLACEMENT;  Surgeon: [name], MD;  Location: [facility];  Service: Urology;  Laterality: Left;  . JOINT REPLACEMENT    . REVISION TOTAL HIP ARTHROPLASTY Left 11/2015  . TOTAL HIP ARTHROPLASTY     left hip  . TOTAL HIP REVISION Left 11/18/2015   Procedure: LEFT TOTAL HIP REVISION;  Surgeon: Marybelle Killings, MD;  Location: [place];  Service: Orthopedics;  Laterality: Left;  . TOTAL KNEE ARTHROPLASTY Right 10/10/2016   Procedure: TOTAL KNEE ARTHROPLASTY;  Surgeon: Marybelle Killings, MD;  Location: [place];  Service: Orthopedics;  Laterality: Right;     reports that he has been smoking cigarettes. He has a 23.00 pack-year smoking history. He has never used smokeless tobacco. He reports that he does not drink alcohol or use drugs.  No Known Allergies  Family History  Problem Relation Age of Onset  . Heart attack Father   . Heart  disease Father   . Cancer Mother   . Diabetes Sister   . Diabetes Brother   . Colon cancer Neg Hx   . Esophageal cancer Neg Hx   . Rectal cancer Neg Hx   . Stomach cancer Neg Hx      Prior to Admission medications   Medication Sig Start Date End Date Taking? Authorizing Provider  albuterol (PROVENTIL HFA;VENTOLIN HFA) 108 (90 BASE) MCG/ACT inhaler Inhale 1-2 puffs into the lungs every 6 (six) hours as needed for wheezing or shortness of breath. Patient not taking:  Reported on 08/15/2018 04/17/14   Hyman Bible, PA-C  albuterol (PROVENTIL HFA;VENTOLIN HFA) 108 (90 Base) MCG/ACT inhaler Inhale 2 puffs into the lungs every 4 (four) hours as needed for wheezing or shortness of breath. 07/19/17   Lacretia Leigh, MD  amLODipine (NORVASC) 10 MG tablet Take 10 mg by mouth daily.  08/24/16   [provider]  aspirin EC 325 MG EC tablet Take 1 tablet (325 mg total) by mouth daily with breakfast. Patient not taking: Reported on 08/15/2018 10/12/16   Lanae Crumbly, PA-C  hydrOXYzine (ATARAX/VISTARIL) 50 MG tablet 50 mg. 07/10/18   [provider]  oxyCODONE-acetaminophen (PERCOCET) 10-325 MG tablet Take 1 tablet by mouth every 6 (six) hours as needed for pain. Patient taking differently: Take 1 tablet by mouth 3 (three) times daily as needed for pain.  07/06/17   [name], MD  phenazopyridine (PYRIDIUM) 200 MG tablet Take 1 tablet (200 mg total) by mouth 3 (three) times daily as needed for pain. Patient not taking: Reported on 08/24/2018 07/06/17   [name], MD  polyethylene glycol [facility] Floria Raveling) packet Take 17 g by mouth daily as needed for mild constipation. Patient not taking: Reported on 08/24/2018 10/12/16   Lanae Crumbly, PA-C  Polyethylene Glycol 3350 (MIRALAX PO) Take by mouth. Miralax 119 gm bowel prep-Take as directed    [provider]  potassium chloride SA (K-DUR,KLOR-CON) 20 MEQ tablet Take 1 tablet (20 mEq total) by mouth daily. Patient not taking: Reported on 08/15/2018 12/04/16   Julianne Rice, MD  sertraline (ZOLOFT) 50 MG tablet 50 mg. 08/21/18   [provider]  SYMBICORT 160-4.5 MCG/ACT inhaler 160 mcg. 08/05/18   [provider]  traZODone (DESYREL) 50 MG tablet 50 mg. 08/21/18   [provider]    Physical Exam: Vitals:   08/26/18 1958 08/26/18 2144 08/26/18 2145 08/26/18 2200  BP: 125/75 (!) 143/87 (!) 143/87 (!) 147/88  Pulse: 89 84 91 87  Resp: 16 18    Temp: 98.1 F (36.7 C)       TempSrc: Oral     SpO2: 94% 96% 90% 95%    Constitutional: NAD, calm  Eyes: PERTLA, lids and conjunctivae normal ENMT: Mucous membranes are moist. Posterior pharynx clear of any exudate or lesions.   Neck: normal, supple, no masses, no thyromegaly Respiratory: clear to auscultation bilaterally, no wheezing, no crackles. Normal respiratory effort.   Cardiovascular: S1 & S2 heard, regular rate and rhythm. No extremity edema.   Abdomen: No distension, no tenderness, soft. Bowel sounds normal.  Musculoskeletal: no clubbing / cyanosis. No joint deformity upper and lower extremities.    Skin: no significant rashes, lesions, ulcers. Warm, dry, well-perfused. Neurologic: No facial asymmetry. Mild dysarthria. Sensation intact. Strength 5/5 in all 4 limbs.  Psychiatric:  Alert and oriented x 3. Pleasant and cooperative.    Labs on Admission: I have personally reviewed following labs and imaging studies  CBC: Recent Labs  Lab 08/26/18 2008 08/26/18 2321  WBC 9.3  --   HGB 16.4 17.0  HCT 51.1 50.0  MCV 93.9  --   PLT 229  --    Basic Metabolic Panel: Recent Labs  Lab 08/26/18 2008 08/26/18 2321  NA 126* 130*  K 6.1* 6.7*  CL 91*  --   CO2 22  --   GLUCOSE 1,017*  --   BUN 35*  --   CREATININE 1.93*  --   CALCIUM 10.3  --    GFR: Estimated Creatinine Clearance: 48.9 mL/min (A) (by C-G formula based on SCr of 1.93 mg/dL (H)). Liver Function Tests: Recent Labs  Lab 08/26/18 2008  AST 27  ALT 34  ALKPHOS 180*  BILITOT 1.2  PROT 9.2*  ALBUMIN 4.8   Recent Labs  Lab 08/26/18 2008  LIPASE 59*   No results for input(s): AMMONIA in the last 168 hours. Coagulation Profile: No results for input(s): INR, PROTIME in the last 168 hours. Cardiac Enzymes: No results for input(s): CKTOTAL, CKMB, CKMBINDEX, TROPONINI in the last 168 hours. BNP (last 3 results) No results for input(s): PROBNP in the last 8760 hours. HbA1C: No results for input(s): HGBA1C in the last 72  hours. CBG: No results for input(s): GLUCAP in the last 168 hours. Lipid Profile: No results for input(s): CHOL, HDL, LDLCALC, TRIG, CHOLHDL, LDLDIRECT in the last 72 hours. Thyroid Function Tests: No results for input(s): TSH, T4TOTAL, FREET4, T3FREE, THYROIDAB in the last 72 hours. Anemia Panel: No results for input(s): VITAMINB12, FOLATE, FERRITIN, TIBC, IRON, RETICCTPCT in the last 72 hours. Urine analysis:    Component Value Date/Time   COLORURINE STRAW (A) 08/26/2018 2049   APPEARANCEUR CLEAR 08/26/2018 2049   LABSPEC 1.025 08/26/2018 2049   PHURINE 5.0 08/26/2018 2049   GLUCOSEU >=500 (A) 08/26/2018 2049   HGBUR MODERATE (A) 08/26/2018 2049   BILIRUBINUR NEGATIVE 08/26/2018 2049   KETONESUR NEGATIVE 08/26/2018 2049   PROTEINUR NEGATIVE 08/26/2018 2049   UROBILINOGEN 1.0 02/14/2012 0350   NITRITE NEGATIVE 08/26/2018 2049   LEUKOCYTESUR NEGATIVE 08/26/2018 2049   Sepsis Labs: @LABRCNTIP (procalcitonin:4,lacticidven:4) )No results found for this or any previous visit (from the past 240 hour(s)).   Radiological Exams on Admission: No results found.  EKG: Independently reviewed. Sinus rhythm.   Assessment/Plan   1. HHS  - Presents with polyuria, polydipsia, abdominal discomfort, and N/V, and is found to have serum glucose 1017 without acidosis or ketonuria  - Likely new-onset DM  - Fluid-resuscitated in ED and started on insulin infusion - Check A1c, continue IVF and insulin infusion with frequent CBG and serial chem panels   2. AKI  - SCr is 1.93 in ED, up from 0.9 in December  - Likely prerenal azotemia in setting of HHS with osmotic diuresis  - Check urine chemistries, continue fluid-resuscitation, renally-dose medications, avoid nephrotoxins, repeat chem panel    3. Hyperkalemia  - Serum potassium is 6.1 in ED, increased to 6.7 on I-stat  - Treated with 1 liter NS in ED and started on insulin infusion  - Anticipate resolution with continued IVF hydration and  insulin  - Give a continuous albuterol neb now, continue IVF and insulin infusions, continue cardiac monitoring, follow serial chem panels   4. COPD  - Patient reports recent increase in his chronic cough and sputum production with mild dyspnea  - There is no fever or leukocytosis, no rhonchi or wheezing on exam  - Check CXR, give continuous albuterol neb  now in light of hyperkalemia, continue ICS/LABA, and continue as-needed albuterol   5. Hypertension  - BP at goal, continue Norvasc     DVT prophylaxis: sq heparin  Code Status: Full  Family Communication: Family updated at bedside Consults called: None Admission status: Observation     Vianne Bulls, MD [facility] Pager [phone]  If 7PM-7AM, please contact night-coverage [email or web address] Password [facility]  08/26/2018, 11:47 PM

## 2018-08-26 NOTE — ED Notes (Signed)
Pt unable to void at this time. 

## 2018-08-26 NOTE — ED Provider Notes (Signed)
[facility] EMERGENCY DEPARTMENT Provider Note   CSN: [id number] Arrival date & time: 08/26/18  1947    History   Chief Complaint Chief Complaint  Patient presents with  . Abdominal Pain  . Emesis  . Diarrhea    HPI Peter Manning is a 63 y.o. male.     HPI Patient presents to the emergency department with generalized weakness along with vomiting and increased urination.  The patient states several weeks ago he started feeling somewhat bad but not significantly and noticed that he was increasing in his urination frequency.  The patient states that ever since he had his colonoscopy this past Friday and since then he feels like his condition is gotten significantly worse with more vomiting weakness and mouth dryness.  The patient states that nothing seems make the condition better.  He states that he does not have any known diabetes.  The patient denies chest pain, shortness of breath, headache,blurred vision, neck pain, fever, numbness, dizziness, anorexia, edema, abdominal pain,  diarrhea, rash, back pain, dysuria, hematemesis, bloody stool, near syncope, or syncope. Past Medical History:  Diagnosis Date  . Bronchitis   . Depression   . Hepatitis 1976   pt. doesn't remember if A, B, C?  . History of kidney stones   . Hyperlipidemia   . Hypertension   . OSA (obstructive sleep apnea) 12/09/2011   PSG 01/03/12>>AHI 45.7, SpO2 low 73%, PLMI 0.  CPAP 15 cm H2O>>AHI 0, +R.   Marland Kitchen Pneumonia 2015  . Renal disorder    had kidney stone  . Sleep apnea    no CPAP ordered per patient/over 3 years    Patient Active Problem List   Diagnosis Date Noted  . Spinal stenosis of cervical region 04/20/2018  . Foraminal stenosis of cervical region 04/20/2018  . Chronic left shoulder pain 01/08/2018  . Lumbar foraminal stenosis 01/08/2018  . S/P lumbar fusion 05/03/2017  . Impingement syndrome of left shoulder 05/03/2017  . S/P total knee arthroplasty, right 12/28/2016  . Right  knee DJD 10/10/2016  . 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 OSA (obstructive sleep apnea) 12/09/2011  . Chronic bronchitis ([place]) 12/09/2011  . Tobacco abuse 12/09/2011    Past Surgical History:  Procedure Laterality Date  . BACK SURGERY  2001   put in screws per pt.  . COLONOSCOPY    . CYSTOSCOPY WITH RETROGRADE PYELOGRAM, URETEROSCOPY AND STENT PLACEMENT Left 07/06/2017   Procedure: CYSTOSCOPY WITH LEFT RETROGRADE PYELOGRAM, URETEROSCOPY WITH HOLMIUM LASER BASKET EXTRACTION AND STENT PLACEMENT;  Surgeon: [name], MD;  Location: [facility];  Service: Urology;  Laterality: Left;  . JOINT REPLACEMENT    . REVISION TOTAL HIP ARTHROPLASTY Left 11/2015  . TOTAL HIP ARTHROPLASTY     left hip  . TOTAL HIP REVISION Left 11/18/2015   Procedure: LEFT TOTAL HIP REVISION;  Surgeon: Marybelle Killings, MD;  Location: [facility];  Service: Orthopedics;  Laterality: Left;  . TOTAL KNEE ARTHROPLASTY Right 10/10/2016   Procedure: TOTAL KNEE ARTHROPLASTY;  Surgeon: Marybelle Killings, MD;  Location: [facility];  Service: Orthopedics;  Laterality: Right;        Home Medications    Prior to Admission medications   Medication Sig Start Date End Date Taking? Authorizing Provider  albuterol (PROVENTIL HFA;VENTOLIN HFA) 108 (90 BASE) MCG/ACT inhaler Inhale 1-2 puffs into the lungs every 6 (six) hours as needed for wheezing or shortness of breath. Patient not taking: Reported on 08/15/2018 04/17/14  Hyman Bible, PA-C  albuterol (PROVENTIL HFA;VENTOLIN HFA) 108 (90 Base) MCG/ACT inhaler Inhale 2 puffs into the lungs every 4 (four) hours as needed for wheezing or shortness of breath. 07/19/17   Lacretia Leigh, MD  amLODipine (NORVASC) 10 MG tablet Take 10 mg by mouth daily.  08/24/16   [provider]  aspirin EC 325 MG EC tablet Take 1 tablet (325 mg total) by mouth daily with breakfast. Patient not taking: Reported on 08/15/2018 10/12/16   Lanae Crumbly, PA-C  hydrOXYzine (ATARAX/VISTARIL) 50 MG  tablet 50 mg. 07/10/18   [provider]  oxyCODONE-acetaminophen (PERCOCET) 10-325 MG tablet Take 1 tablet by mouth every 6 (six) hours as needed for pain. Patient taking differently: Take 1 tablet by mouth 3 (three) times daily as needed for pain.  07/06/17   [name], MD  phenazopyridine (PYRIDIUM) 200 MG tablet Take 1 tablet (200 mg total) by mouth 3 (three) times daily as needed for pain. Patient not taking: Reported on 08/24/2018 07/06/17   [name], MD  polyethylene glycol [facility] / Floria Raveling) packet Take 17 g by mouth daily as needed for mild constipation. Patient not taking: Reported on 08/24/2018 10/12/16   Lanae Crumbly, PA-C  Polyethylene Glycol 3350 (MIRALAX PO) Take by mouth. Miralax 119 gm bowel prep-Take as directed    [provider]  potassium chloride SA (K-DUR,KLOR-CON) 20 MEQ tablet Take 1 tablet (20 mEq total) by mouth daily. Patient not taking: Reported on 08/15/2018 12/04/16   Julianne Rice, MD  sertraline (ZOLOFT) 50 MG tablet 50 mg. 08/21/18   [provider]  SYMBICORT 160-4.5 MCG/ACT inhaler 160 mcg. 08/05/18   [provider]  traZODone (DESYREL) 50 MG tablet 50 mg. 08/21/18   [provider]    Family History Family History  Problem Relation Age of Onset  . Heart attack Father   . Heart disease Father   . Cancer Mother   . Colon cancer Neg Hx   . Esophageal cancer Neg Hx   . Rectal cancer Neg Hx   . Stomach cancer Neg Hx     Social History Social History   Tobacco Use  . Smoking status: Current Some Day Smoker    Packs/day: 0.50    Years: 46.00    Pack years: 23.00    Types: Cigarettes  . Smokeless tobacco: Never Used  . Tobacco comment: states on and off smoker  Substance Use Topics  . Alcohol use: No  . Drug use: No     Allergies   Patient has no known allergies.   Review of Systems Review of Systems All other systems negative except as documented in the HPI. All pertinent positives and negatives  as reviewed in the HPI.  Physical Exam Updated Vital Signs BP (!) 147/88   Pulse 87   Temp 98.1 F (36.7 C) (Oral)   Resp 18   SpO2 95%   Physical Exam Vitals signs and nursing note reviewed.  Constitutional:      General: He is not in acute distress.    Appearance: He is well-developed.  HENT:     Head: Normocephalic and atraumatic.     Mouth/Throat:     Mouth: Mucous membranes are dry.     Pharynx: Uvula midline.  Eyes:     Pupils: Pupils are equal, round, and reactive to light.  Neck:     Musculoskeletal: Normal range of motion and neck supple.  Cardiovascular:     Rate and Rhythm: Normal rate  and regular rhythm.     Heart sounds: Normal heart sounds. No murmur. No friction rub. No gallop.   Pulmonary:     Effort: Pulmonary effort is normal. No respiratory distress.     Breath sounds: Normal breath sounds. No wheezing.  Abdominal:     General: Bowel sounds are normal. There is no distension.     Palpations: Abdomen is soft.     Tenderness: There is no abdominal tenderness.  Skin:    General: Skin is warm and dry.     Capillary Refill: Capillary refill takes less than 2 seconds.     Findings: No erythema or rash.  Neurological:     Mental Status: He is alert and oriented to person, place, and time.     Motor: No abnormal muscle tone.     Coordination: Coordination normal.  Psychiatric:        Behavior: Behavior normal.      ED Treatments / Results  Labs (all labs ordered are listed, but only abnormal results are displayed) Labs Reviewed  LIPASE, BLOOD - Abnormal; Notable for the following components:      Result Value   Lipase 59 (*)    All other components within normal limits  COMPREHENSIVE METABOLIC PANEL - Abnormal; Notable for the following components:   Sodium 126 (*)    Potassium 6.1 (*)    Chloride 91 (*)    Glucose, Bld 1,017 (*)    BUN 35 (*)    Creatinine, Ser 1.93 (*)    Total Protein 9.2 (*)    Alkaline Phosphatase 180 (*)    GFR calc  non Af Amer 36 (*)    GFR calc Af Amer 42 (*)    All other components within normal limits  URINALYSIS, ROUTINE W REFLEX MICROSCOPIC - Abnormal; Notable for the following components:   Color, Urine STRAW (*)    Glucose, UA >=500 (*)    Hgb urine dipstick MODERATE (*)    All other components within normal limits  CBC  I-STAT VENOUS BLOOD GAS, ED    EKG EKG Interpretation  Date/Time:  Sunday August 26 2018 22:29:17 EST Ventricular Rate:  86 PR Interval:    QRS Duration: 103 QT Interval:  412 QTC Calculation: 493 R Axis:   47 Text Interpretation:  Sinus rhythm Borderline prolonged QT interval No significant change since last tracing Confirmed by Duffy Bruce [phone]) on 08/26/2018 10:44:27 PM   Radiology No results found.  Procedures Procedures (including critical care time)  Medications Ordered in ED Medications  sodium chloride flush (NS) 0.9 % injection 3 mL (has no administration in time range)  dextrose 5 %-0.45 % sodium chloride infusion (has no administration in time range)  insulin regular, human (MYXREDLIN) 100 units/ 100 mL infusion (has no administration in time range)  sodium chloride 0.9 % bolus 1,000 mL (1,000 mLs Intravenous New Bag/Given 08/26/18 2255)    And  0.9 %  sodium chloride infusion (has no administration in time range)  calcium gluconate inj 10% (1 g) URGENT USE ONLY! (has no administration in time range)     Initial Impression / Assessment and Plan / ED Course  I have reviewed the triage vital signs and the nursing notes.  Pertinent labs & imaging results that were available during my care of the patient were reviewed by me and considered in my medical decision making (see chart for details).        Spoke with the [facility] hospitalist who will evaluate  the patient for admission.  The patient is not in DKA at this time but was given treatment for his blood sugar along with his hyper kalemia.  Patient has been monitored closely and advised of  the plan and all questions were answered.  The glucose stabilizer protocol was initiated on the patient.  CRITICAL CARE Performed by: Resa Miner Pilar Westergaard Total critical care time:40 minutes Critical care time was exclusive of separately billable procedures and treating other patients. Critical care was necessary to treat or prevent imminent or life-threatening deterioration. Critical care was time spent personally by me on the following activities: development of treatment plan with patient and/or surrogate as well as nursing, discussions with consultants, evaluation of patient's response to treatment, examination of patient, obtaining history from patient or surrogate, ordering and performing treatments and interventions, ordering and review of laboratory studies, ordering and review of radiographic studies, pulse oximetry and re-evaluation of patient's condition.   Final Clinical Impressions(s) / ED Diagnoses   Final diagnoses:  None    ED Discharge Orders    None       Rebeca Allegra 08/26/18 2323    Duffy Bruce, MD 08/29/18 [phone]

## 2018-08-26 NOTE — ED Notes (Signed)
Pt denies history of diabetes.

## 2018-08-26 NOTE — ED Notes (Signed)
Checked pt CBG meter reads HI, RN Gerald Stabs informed

## 2018-08-26 NOTE — ED Notes (Signed)
Dr. Ellender Hose notified of Glucose of 1,017 and pt being moved to treatment room.

## 2018-08-27 ENCOUNTER — Other Ambulatory Visit: Payer: Self-pay

## 2018-08-27 ENCOUNTER — Telehealth: Payer: Self-pay | Admitting: *Deleted

## 2018-08-27 ENCOUNTER — Encounter (HOSPITAL_COMMUNITY): Payer: Self-pay

## 2018-08-27 ENCOUNTER — Observation Stay (HOSPITAL_COMMUNITY): Payer: Medicare Other

## 2018-08-27 DIAGNOSIS — R7989 Other specified abnormal findings of blood chemistry: Secondary | ICD-10-CM | POA: Diagnosis present

## 2018-08-27 DIAGNOSIS — Z8701 Personal history of pneumonia (recurrent): Secondary | ICD-10-CM | POA: Diagnosis not present

## 2018-08-27 DIAGNOSIS — R739 Hyperglycemia, unspecified: Secondary | ICD-10-CM | POA: Diagnosis present

## 2018-08-27 DIAGNOSIS — E785 Hyperlipidemia, unspecified: Secondary | ICD-10-CM | POA: Diagnosis present

## 2018-08-27 DIAGNOSIS — Z7951 Long term (current) use of inhaled steroids: Secondary | ICD-10-CM | POA: Diagnosis not present

## 2018-08-27 DIAGNOSIS — E861 Hypovolemia: Secondary | ICD-10-CM | POA: Diagnosis present

## 2018-08-27 DIAGNOSIS — E860 Dehydration: Secondary | ICD-10-CM | POA: Diagnosis present

## 2018-08-27 DIAGNOSIS — Z7982 Long term (current) use of aspirin: Secondary | ICD-10-CM | POA: Diagnosis not present

## 2018-08-27 DIAGNOSIS — Z79899 Other long term (current) drug therapy: Secondary | ICD-10-CM | POA: Diagnosis not present

## 2018-08-27 DIAGNOSIS — G4733 Obstructive sleep apnea (adult) (pediatric): Secondary | ICD-10-CM | POA: Diagnosis present

## 2018-08-27 DIAGNOSIS — K625 Hemorrhage of anus and rectum: Secondary | ICD-10-CM | POA: Diagnosis present

## 2018-08-27 DIAGNOSIS — Z833 Family history of diabetes mellitus: Secondary | ICD-10-CM | POA: Diagnosis not present

## 2018-08-27 DIAGNOSIS — N179 Acute kidney failure, unspecified: Secondary | ICD-10-CM | POA: Diagnosis present

## 2018-08-27 DIAGNOSIS — J42 Unspecified chronic bronchitis: Secondary | ICD-10-CM | POA: Diagnosis not present

## 2018-08-27 DIAGNOSIS — I10 Essential (primary) hypertension: Secondary | ICD-10-CM | POA: Diagnosis present

## 2018-08-27 DIAGNOSIS — F418 Other specified anxiety disorders: Secondary | ICD-10-CM | POA: Diagnosis present

## 2018-08-27 DIAGNOSIS — J449 Chronic obstructive pulmonary disease, unspecified: Secondary | ICD-10-CM | POA: Diagnosis present

## 2018-08-27 DIAGNOSIS — E875 Hyperkalemia: Secondary | ICD-10-CM | POA: Diagnosis present

## 2018-08-27 DIAGNOSIS — Z87442 Personal history of urinary calculi: Secondary | ICD-10-CM | POA: Diagnosis not present

## 2018-08-27 DIAGNOSIS — Z87891 Personal history of nicotine dependence: Secondary | ICD-10-CM | POA: Diagnosis not present

## 2018-08-27 DIAGNOSIS — E1101 Type 2 diabetes mellitus with hyperosmolarity with coma: Secondary | ICD-10-CM | POA: Diagnosis present

## 2018-08-27 LAB — GLUCOSE, CAPILLARY
GLUCOSE-CAPILLARY: 359 mg/dL — AB (ref 70–99)
GLUCOSE-CAPILLARY: 118 mg/dL — AB (ref 70–99)
Glucose-Capillary: 123 mg/dL — ABNORMAL HIGH (ref 70–99)
Glucose-Capillary: 135 mg/dL — ABNORMAL HIGH (ref 70–99)
Glucose-Capillary: 183 mg/dL — ABNORMAL HIGH (ref 70–99)
Glucose-Capillary: 208 mg/dL — ABNORMAL HIGH (ref 70–99)
Glucose-Capillary: 213 mg/dL — ABNORMAL HIGH (ref 70–99)
Glucose-Capillary: 230 mg/dL — ABNORMAL HIGH (ref 70–99)
Glucose-Capillary: 260 mg/dL — ABNORMAL HIGH (ref 70–99)
Glucose-Capillary: 269 mg/dL — ABNORMAL HIGH (ref 70–99)
Glucose-Capillary: 275 mg/dL — ABNORMAL HIGH (ref 70–99)
Glucose-Capillary: 281 mg/dL — ABNORMAL HIGH (ref 70–99)
Glucose-Capillary: 287 mg/dL — ABNORMAL HIGH (ref 70–99)
Glucose-Capillary: 300 mg/dL — ABNORMAL HIGH (ref 70–99)

## 2018-08-27 LAB — BASIC METABOLIC PANEL
ANION GAP: 11 (ref 5–15)
ANION GAP: 10 (ref 5–15)
Anion gap: 13 (ref 5–15)
Anion gap: 13 (ref 5–15)
Anion gap: 12 (ref 5–15)
BUN: 34 mg/dL — ABNORMAL HIGH (ref 8–23)
BUN: 30 mg/dL — ABNORMAL HIGH (ref 8–23)
BUN: 27 mg/dL — ABNORMAL HIGH (ref 8–23)
BUN: 24 mg/dL — ABNORMAL HIGH (ref 8–23)
BUN: 20 mg/dL (ref 8–23)
CHLORIDE: 106 mmol/L (ref 98–111)
CHLORIDE: 103 mmol/L (ref 98–111)
CO2: 21 mmol/L — ABNORMAL LOW (ref 22–32)
CO2: 23 mmol/L (ref 22–32)
CO2: 25 mmol/L (ref 22–32)
CO2: 22 mmol/L (ref 22–32)
CO2: 26 mmol/L (ref 22–32)
CREATININE: 1.64 mg/dL — AB (ref 0.61–1.24)
CREATININE: 1.76 mg/dL — AB (ref 0.61–1.24)
Calcium: 9.9 mg/dL (ref 8.9–10.3)
Calcium: 10.1 mg/dL (ref 8.9–10.3)
Calcium: 10.5 mg/dL — ABNORMAL HIGH (ref 8.9–10.3)
Calcium: 9.8 mg/dL (ref 8.9–10.3)
Calcium: 9.7 mg/dL (ref 8.9–10.3)
Chloride: 99 mmol/L (ref 98–111)
Chloride: 103 mmol/L (ref 98–111)
Chloride: 104 mmol/L (ref 98–111)
Creatinine, Ser: 1.59 mg/dL — ABNORMAL HIGH (ref 0.61–1.24)
Creatinine, Ser: 1.36 mg/dL — ABNORMAL HIGH (ref 0.61–1.24)
Creatinine, Ser: 1.18 mg/dL (ref 0.61–1.24)
GFR calc Af Amer: 47 mL/min — ABNORMAL LOW (ref >60)
GFR calc Af Amer: 51 mL/min — ABNORMAL LOW (ref >60)
GFR calc Af Amer: 53 mL/min — ABNORMAL LOW (ref >60)
GFR calc Af Amer: >60 mL/min (ref >60)
GFR calc Af Amer: >60 mL/min (ref >60)
GFR calc non Af Amer: 41 mL/min — ABNORMAL LOW (ref >60)
GFR calc non Af Amer: 44 mL/min — ABNORMAL LOW (ref >60)
GFR calc non Af Amer: 55 mL/min — ABNORMAL LOW (ref >60)
GFR calc non Af Amer: >60 mL/min (ref >60)
GFR, EST NON AFRICAN AMERICAN: 46 mL/min — AB (ref >60)
Glucose, Bld: 826 mg/dL (ref 70–99)
Glucose, Bld: 417 mg/dL — ABNORMAL HIGH (ref 70–99)
Glucose, Bld: 257 mg/dL — ABNORMAL HIGH (ref 70–99)
Glucose, Bld: 249 mg/dL — ABNORMAL HIGH (ref 70–99)
Glucose, Bld: 131 mg/dL — ABNORMAL HIGH (ref 70–99)
POTASSIUM: 4.1 mmol/L (ref 3.5–5.1)
POTASSIUM: 3.2 mmol/L — AB (ref 3.5–5.1)
Potassium: 6.3 mmol/L (ref 3.5–5.1)
Potassium: 4.5 mmol/L (ref 3.5–5.1)
Potassium: 4.2 mmol/L (ref 3.5–5.1)
SODIUM: 139 mmol/L (ref 135–145)
Sodium: 133 mmol/L — ABNORMAL LOW (ref 135–145)
Sodium: 141 mmol/L (ref 135–145)
Sodium: 139 mmol/L (ref 135–145)
Sodium: 139 mmol/L (ref 135–145)

## 2018-08-27 LAB — CBG MONITORING, ED
Glucose-Capillary: >600 mg/dL (ref 70–99)
Glucose-Capillary: 584 mg/dL (ref 70–99)
Glucose-Capillary: 355 mg/dL — ABNORMAL HIGH (ref 70–99)

## 2018-08-27 LAB — CREATININE, URINE, RANDOM: Creatinine, Urine: 30.65 mg/dL

## 2018-08-27 LAB — SODIUM, URINE, RANDOM: Sodium, Ur: 18 mmol/L

## 2018-08-27 LAB — HIV ANTIBODY (ROUTINE TESTING W REFLEX): HIV Screen 4th Generation wRfx: NONREACTIVE

## 2018-08-27 LAB — MRSA PCR SCREENING: MRSA by PCR: NEGATIVE

## 2018-08-27 LAB — HEMOGLOBIN A1C
HEMOGLOBIN A1C: 11.3 % — AB (ref 4.8–5.6)
Mean Plasma Glucose: 277.61 mg/dL

## 2018-08-27 MED ORDER — INSULIN ASPART 100 UNIT/ML ~~LOC~~ SOLN
0.0000 [IU] | SUBCUTANEOUS | Status: DC
  Administered 2018-08-27: 5 [IU] via SUBCUTANEOUS
  Administered 2018-08-27: 1 [IU] via SUBCUTANEOUS
  Administered 2018-08-27: 5 [IU] via SUBCUTANEOUS
  Administered 2018-08-28: 7 [IU] via SUBCUTANEOUS

## 2018-08-27 MED ORDER — INSULIN GLARGINE 100 UNIT/ML ~~LOC~~ SOLN
15.0000 [IU] | Freq: Every day | SUBCUTANEOUS | Status: DC
  Administered 2018-08-27: 15 [IU] via SUBCUTANEOUS
  Filled 2018-08-27 (×2): qty 0.15

## 2018-08-27 MED ORDER — LIVING WELL WITH DIABETES BOOK
Freq: Once | Status: AC
  Administered 2018-08-27: 12:00:00
  Filled 2018-08-27: qty 1

## 2018-08-27 NOTE — Telephone Encounter (Signed)
Called patient for post procedure follow up. Patient presently in hospital with hyperglycemia. Message left for the patient.

## 2018-08-27 NOTE — Progress Notes (Signed)
Inpatient Diabetes Program Recommendations  AACE/ADA: New Consensus Statement on Inpatient Glycemic Control (2015)  Target Ranges:  Prepandial:   less than 140 mg/dL      Peak postprandial:   less than 180 mg/dL (1-2 hours)      Critically ill patients:  140 - 180 mg/dL   Lab Results  Component Value Date   GLUCAP 230 (H) 08/27/2018   HGBA1C 11.3 (H) 08/27/2018    Review of Glycemic Control  Diabetes history: New-onset DM Outpatient Diabetes medications: N/A Current orders for Inpatient glycemic control:   Continues with IV insulin drip. Eating 100%. Drip rate up to 19.4%.  Spoke with pt and sister regarding new diagnosis of diabetes. Pt states he's recently lost weight. Has not been exercising, but has a [employer]. States "I don't like needles." Discussed HgbA1C of 11.3% and importance of reducing this to 3-4% to avoid complications related to high blood sugars.  Pt states he would prefer insulin pen rather than vial and syringe. Discussed glucose monitoring and taking logbook to PCP for any needed insulin adjustments. Discussed how diet, exercise and stress management affect blood sugars and importance of lifestyle changes with healthy diet and getting some exercise with help with reducing blood sugars. Discussed hypoglycemia s/s and treatment. Pt to write down any questions he thinks of and will address tomorrow. Will teach insulin pen administration tomorrow afternoon per sister's request.  Spoke with RN and asked to have pt give his insulin and stick his finger for blood sugars. Pt to be an active participant in his own care.  Will also benefit from OP Diabetes Education for new-onset DM.   Inpatient Diabetes Program Recommendations:     Lantus 18 units Q24H - give 2 hours prior to discontinuation of insulin drip. Novolog 0-15 units Q4H x 12H when insulin drip is d/ced. Change to tidwc and hs after 12H. Case manager consult for price of insulin pens and preferred insulin  brands.  Continue to follow.  Thank you. Lorenda Peck, RD, LDN, CDE Inpatient Diabetes Coordinator [phone]

## 2018-08-27 NOTE — Progress Notes (Signed)
Discussed throughout day pt's new role in testing CBG and giving insulin.  Verbal teach back portrayed, as glucostabilizer was being d/c at dinner time pt gave own lantus and SSI injection.  Continue teaching as pt navigates new diagnosis.

## 2018-08-27 NOTE — ED Notes (Signed)
Pt CBG 584 RN Chris informed

## 2018-08-27 NOTE — Progress Notes (Addendum)
PROGRESS NOTE  Attending MD note  Patient was seen, examined,treatment plan was discussed with the PA-S.  I have personally reviewed the clinical findings, lab, imaging studies and management of this patient in detail. I agree with the documentation, as recorded by the PA-S  Patient is 63 year old male with history of COPD, just quit smoking a week ago, OSA, hypertension who came to the ED on 2/23 with general malaise, abdominal discomfort, nausea and vomiting, and also reports a several week history of weight loss, polyuria and polydipsia.  He was found to have new onset diabetes mellitus with hyperosmolar hyperglycemic state.  He was placed on insulin infusion for CBG in the 1000 range on admission  Constitutional: NAD, calm, comfortable Vitals:   08/27/18 0545 08/27/18 0603 08/27/18 0803 08/27/18 0905  BP: 104/75  128/87   Pulse: 88  85   Resp: (!) 21  18   Temp: 98.7 F (37.1 C)  97.8 F (36.6 C)   TempSrc: Oral  Oral   SpO2: 93%  93% 96%  Weight: 107.2 kg     Height:  5\' 8"  (1.727 m)     Eyes: PERRL, lids and conjunctivae normal ENMT: Mucous membranes are moist.  Neck: normal, supple Respiratory: clear to auscultation bilaterally, no wheezing, no crackles. Normal respiratory effort.  Cardiovascular: Regular rate and rhythm, no murmurs / rubs / gallops. No extremity edema. 2+ pedal pulses.  Abdomen: no tenderness, no masses palpated. Bowel sounds positive.  Musculoskeletal: no clubbing / cyanosis. Normal muscle tone.  Skin: no rashes, lesions, ulcers. No induration Neurologic: CN 2-12 grossly intact. Strength 5/5 in all 4.     Plan New onset type 2 diabetes mellitus with HHS and significant hyperglycemia -Patient admitted to stepdown, placed on insulin infusion as well as fluids, continue, will transition to subcutaneous insulin when he meets criteria to come off of the insulin drip -His A1c is in the 11 range, will likely need subcutaneous insulin at home -Diabetes  coordinator consulted, appreciate input.  Unfortunately he is worried and afraid of using needles, will see if we can do a pen or minimize insulin injections per day  Acute kidney injury -Likely in the setting of dehydration, slowly improving with IV fluids  COPD -Stable, no wheezing, continue home medications  Tobacco abuse, in remission -He quit just a few days ago prior to admission, strongly counseled to maintain cessation  Hypertension -Continue Norvasc, blood pressure is at goal  Hyperkalemia -Improved with fluids   Scheduled Meds: . amLODipine  10 mg Oral Daily  . heparin  5,000 Units Subcutaneous Q8H  . mometasone-formoterol  2 puff Inhalation BID  . sodium chloride flush  3 mL Intravenous Once   Continuous Infusions: . dextrose 5 % and 0.45% NaCl 75 mL/hr at 08/27/18 0735  . insulin 19.4 Units/hr (08/27/18 1255)   PRN Meds:.albuterol, oxyCODONE-acetaminophen **AND** oxyCODONE   Rest as below  Ailish Prospero M. Cruzita Lederer, MD, PhD [facility]  Contact via  [email or web address]  [facility] Office Info P: [phone]  F: [phone]    Dawsyn Ramsaran MEQ:[id number] DOB: 24-Aug-1955 DOA: 08/26/2018 PCP: Nolene Ebbs, MD  HPI/Brief Narrative  Peter Manning is a 63 y.o. year old male with medical history significant for COPD, OSA, hypertension who presented to the Emergency Department on 08/26/2018 for the evaluation of polyuria, polydipsia, abdominal discomfort, nausea with nonbloody vomiting, and general malaise, and was found to have hyperkalemia and hyperosmolar hyperglycemic state. Patient reported insidious development of polydipsia and polyuria over the past 2-3 weeks.  Then 3 days ago, after colonoscopy, patient reports new onset of nausea, abdominal discomfort, and nonbloody vomiting. Patient was given a liter of NS and started on insulin infusion in the ED.   He underwent colonoscopy with polypectomy on 08/24/2018, tolerated the procedure well, has had some scant rectal  bleeding since then.   Subjective Patient is feeling somewhat improved after starting insulin. No chest pain, no shortness of breath.   Assessment/Plan:  1. HHS  - Pt presented with polyuria, polydipsia, abdominal discomfort, and N/V, and was found to have serum glucose 1017 without acidosis or ketonuria. Today, serum glucose was 281 on insulin infusion. A1c 11.3. - Likely new-onset DM  - Fluid-resuscitated in ED and started on insulin infusion - Continue IVF and insulin infusion with frequent CBG and serial chem panels   2. AKI  - SCr 1.59 today, up from 0.9 in December 2019 - Likely prerenal azotemia in setting of HHS with osmotic diuresis  - Continue fluid-resuscitation, renally-dose medications, avoid nephrotoxins, follow serial chem panels  3. Hyperkalemia  - Serum potassium is 4.2 today, improved from 6.1 in ED. Pt was treated with 1 liter NS and insulin in the ED.  - Continue albuterol neb and insulin infusions, continue fluid resuscitation, follow serial chem panels   4. COPD  - Patient reports recent increase in his chronic cough and sputum production with mild dyspnea  - No fever or leukocytosis present. No rhonchi or wheezing on exam. CXR was normal. - Continue nebulized albuterol, continue ICS/LABA.  5. Hypertension  - BP at goal, continue Norvasc   DVT prophylaxis: sq heparin  Code Status: Full  Family Communication: family not at bedside Consults called: None Admission status: Observation    Objective: Vitals:   08/27/18 0545 08/27/18 0603 08/27/18 0803 08/27/18 0905  BP: 104/75  128/87   Pulse: 88  85   Resp: (!) 21  18   Temp: 98.7 F (37.1 C)  97.8 F (36.6 C)   TempSrc: Oral  Oral   SpO2: 93%  93% 96%  Weight: 107.2 kg     Height:  5\' 8"  (1.727 m)      Intake/Output Summary (Last 24 hours) at 08/27/2018 1251 Last data filed at 08/27/2018 1200 Gross per 24 hour  Intake 2635.93 ml  Output -  Net 2635.93 ml   Filed Weights   08/27/18 0545    Weight: 107.2 kg    Exam:  Constitutional: normal appearing male HEENT: grossly normal Cardiovascular: RRR no MRGs, with no peripheral edema Respiratory: Normal respiratory effort, clear breath sounds  Skin: No rash ulcers, or lesions.  Neurologic: Grossly no focal neuro deficit. Psychiatric: Appropriate affect, and mood.   Data Reviewed: CBC: Recent Labs  Lab 08/26/18 2008 08/26/18 2321  WBC 9.3  --   HGB 16.4 17.0  HCT 51.1 50.0  MCV 93.9  --   PLT 229  --    Basic Metabolic Panel: Recent Labs  Lab 08/26/18 2008 08/26/18 2321 08/27/18 0017 08/27/18 0405 08/27/18 0657 08/27/18 1142  NA 126* 130* 133* 139 141 139  K 6.1* 6.7* 6.3* 4.5 4.2 4.1  CL 91*  --  99 103 104 106  CO2 22  --  21* 23 25 22   GLUCOSE 1,017*  --  826* 417* 257* 249*  BUN 35*  --  34* 30* 27* 24*  CREATININE 1.93*  --  1.76* 1.64* 1.59* 1.36*  CALCIUM 10.3  --  9.9 10.1 10.5* 9.8   GFR: Estimated Creatinine  Clearance: 66.8 mL/min (A) (by C-G formula based on SCr of 1.36 mg/dL (H)). Liver Function Tests: Recent Labs  Lab 08/26/18 2008  AST 27  ALT 34  ALKPHOS 180*  BILITOT 1.2  PROT 9.2*  ALBUMIN 4.8   Recent Labs  Lab 08/26/18 2008  LIPASE 59*   No results for input(s): AMMONIA in the last 168 hours. Coagulation Profile: No results for input(s): INR, PROTIME in the last 168 hours. Cardiac Enzymes: No results for input(s): CKTOTAL, CKMB, CKMBINDEX, TROPONINI in the last 168 hours. BNP (last 3 results) No results for input(s): PROBNP in the last 8760 hours. HbA1C: Recent Labs    08/27/18 0919  HGBA1C 11.3*   CBG: Recent Labs  Lab 08/27/18 0628 08/27/18 0731 08/27/18 0841 08/27/18 0941 08/27/18 1049  GLUCAP 300* 213* 183* 269* 281*   Lipid Profile: No results for input(s): CHOL, HDL, LDLCALC, TRIG, CHOLHDL, LDLDIRECT in the last 72 hours. Thyroid Function Tests: No results for input(s): TSH, T4TOTAL, FREET4, T3FREE, THYROIDAB in the last 72 hours. Anemia  Panel: No results for input(s): VITAMINB12, FOLATE, FERRITIN, TIBC, IRON, RETICCTPCT in the last 72 hours. Urine analysis:    Component Value Date/Time   COLORURINE STRAW (A) 08/26/2018 2049   APPEARANCEUR CLEAR 08/26/2018 2049   LABSPEC 1.025 08/26/2018 2049   PHURINE 5.0 08/26/2018 2049   GLUCOSEU >=500 (A) 08/26/2018 2049   HGBUR MODERATE (A) 08/26/2018 2049   BILIRUBINUR NEGATIVE 08/26/2018 2049   KETONESUR NEGATIVE 08/26/2018 2049   PROTEINUR NEGATIVE 08/26/2018 2049   UROBILINOGEN 1.0 02/14/2012 0350   NITRITE NEGATIVE 08/26/2018 2049   LEUKOCYTESUR NEGATIVE 08/26/2018 2049   Sepsis Labs: @LABRCNTIP (procalcitonin:4,lacticidven:4)  ) Recent Results (from the past 240 hour(s))  MRSA PCR Screening     Status: None   Collection Time: 08/27/18  5:48 AM  Result Value Ref Range Status   MRSA by PCR NEGATIVE NEGATIVE Final    Comment:        The GeneXpert MRSA Assay (FDA approved for NASAL specimens only), is one component of a comprehensive MRSA colonization surveillance program. It is not intended to diagnose MRSA infection nor to guide or monitor treatment for MRSA infections. Performed at [facility], [place] [address], [place], [place] [address]       Studies: Dg Chest 2 View  Result Date: 08/27/2018 CLINICAL DATA:  63 year old male with cough. EXAM: CHEST - 2 VIEW COMPARISON:  Chest radiograph dated 10/17/2017 FINDINGS: The lungs are clear. There is no pleural effusion or pneumothorax. The cardiac silhouette is within normal limits. No acute osseous pathology. Stimulator wire noted over the lower thoracic spine. IMPRESSION: No active cardiopulmonary disease. Electronically Signed   By: Anner Crete M.D.   On: 08/27/2018 00:26    Scheduled Meds: . amLODipine  10 mg Oral Daily  . heparin  5,000 Units Subcutaneous Q8H  . mometasone-formoterol  2 puff Inhalation BID  . sodium chloride flush  3 mL Intravenous Once    Continuous Infusions: . dextrose 5 %  and 0.45% NaCl 75 mL/hr at 08/27/18 0735  . insulin 13.6 Units/hr (08/27/18 1154)     LOS: 0 days    Marin Roberts, PA-S 08/27/18

## 2018-08-28 LAB — BASIC METABOLIC PANEL
Anion gap: 14 (ref 5–15)
BUN: 16 mg/dL (ref 8–23)
CO2: 24 mmol/L (ref 22–32)
Calcium: 9.6 mg/dL (ref 8.9–10.3)
Chloride: 99 mmol/L (ref 98–111)
Creatinine, Ser: 1.27 mg/dL — ABNORMAL HIGH (ref 0.61–1.24)
GFR calc Af Amer: >60 mL/min (ref >60)
GFR calc non Af Amer: >60 mL/min (ref >60)
Glucose, Bld: 264 mg/dL — ABNORMAL HIGH (ref 70–99)
Potassium: 3.8 mmol/L (ref 3.5–5.1)
Sodium: 137 mmol/L (ref 135–145)

## 2018-08-28 LAB — GLUCOSE, CAPILLARY
GLUCOSE-CAPILLARY: 249 mg/dL — AB (ref 70–99)
Glucose-Capillary: 312 mg/dL — ABNORMAL HIGH (ref 70–99)
Glucose-Capillary: 388 mg/dL — ABNORMAL HIGH (ref 70–99)
Glucose-Capillary: 394 mg/dL — ABNORMAL HIGH (ref 70–99)
Glucose-Capillary: 388 mg/dL — ABNORMAL HIGH (ref 70–99)
Glucose-Capillary: 286 mg/dL — ABNORMAL HIGH (ref 70–99)
Glucose-Capillary: 237 mg/dL — ABNORMAL HIGH (ref 70–99)

## 2018-08-28 LAB — CBC
HCT: 45.9 % (ref 39.0–52.0)
Hemoglobin: 15 g/dL (ref 13.0–17.0)
MCH: 30.7 pg (ref 26.0–34.0)
MCHC: 32.7 g/dL (ref 30.0–36.0)
MCV: 94.1 fL (ref 80.0–100.0)
Platelets: 170 10*3/uL (ref 150–400)
RBC: 4.88 MIL/uL (ref 4.22–5.81)
RDW: 11.4 % — ABNORMAL LOW (ref 11.5–15.5)
WBC: 7.7 10*3/uL (ref 4.0–10.5)
nRBC: 0 % (ref 0.0–0.2)

## 2018-08-28 MED ORDER — INSULIN ASPART 100 UNIT/ML ~~LOC~~ SOLN
0.0000 [IU] | Freq: Three times a day (TID) | SUBCUTANEOUS | Status: DC
  Administered 2018-08-28: 9 [IU] via SUBCUTANEOUS

## 2018-08-28 MED ORDER — INSULIN ASPART 100 UNIT/ML ~~LOC~~ SOLN
0.0000 [IU] | Freq: Three times a day (TID) | SUBCUTANEOUS | Status: DC
  Administered 2018-08-28: 8 [IU] via SUBCUTANEOUS
  Administered 2018-08-29: 15 [IU] via SUBCUTANEOUS
  Administered 2018-08-29: 5 [IU] via SUBCUTANEOUS

## 2018-08-28 MED ORDER — INSULIN ASPART 100 UNIT/ML ~~LOC~~ SOLN
0.0000 [IU] | Freq: Every day | SUBCUTANEOUS | Status: DC
  Administered 2018-08-28: 3 [IU] via SUBCUTANEOUS

## 2018-08-28 MED ORDER — INSULIN GLARGINE 100 UNIT/ML ~~LOC~~ SOLN
18.0000 [IU] | Freq: Every day | SUBCUTANEOUS | Status: DC
  Administered 2018-08-28: 18 [IU] via SUBCUTANEOUS
  Filled 2018-08-28 (×2): qty 0.18

## 2018-08-28 MED ORDER — INSULIN ASPART 100 UNIT/ML ~~LOC~~ SOLN
3.0000 [IU] | Freq: Three times a day (TID) | SUBCUTANEOUS | Status: DC
  Administered 2018-08-28 – 2018-08-29 (×4): 3 [IU] via SUBCUTANEOUS

## 2018-08-28 NOTE — Progress Notes (Signed)
PROGRESS NOTE  Peter Manning WER:[id number] DOB: 1955-08-28 DOA: 08/26/2018 PCP: Nolene Ebbs, MD   LOS: 1 day   Brief Narrative / Interim history: Patient is 63 year old male with history of COPD, just quit smoking a week ago, OSA, hypertension who came to the ED on 2/23 with general malaise, abdominal discomfort, nausea and vomiting, and also reports a several week history of weight loss, polyuria and polydipsia.  He was found to have new onset diabetes mellitus with hyperosmolar hyperglycemic state.  He was placed on insulin infusion for CBG in the 1000 range on admission  Subjective: - no chest pain, shortness of breath, no abdominal pain, nausea or vomiting.   Assessment & Plan: Principal Problem:   Hyperosmolar hyperglycemic coma due to diabetes mellitus without ketoacidosis (HCC) Active Problems:   OSA (obstructive sleep apnea)   Chronic bronchitis (HCC)   AKI (acute kidney injury) (HCC)   Hyperkalemia   Principal Problem New onset type 2 diabetes mellitus with HHS and significant hyperglycemia -Patient admitted to stepdown, placed on insulin infusion as well as fluids, continue, will transition to subcutaneous insulin when he meets criteria to come off of the insulin drip -His A1c is in the 11 range, will likely need subcutaneous insulin at home -Diabetes coordinator consulted, appreciate input. He is doing well with self injecting with a pen -remains quite high today, will increase Lantus, SSI to moderate and add mealtime scheduled Novolog -had medicaid and can do pens at home -hopefully home in am if CBGs better  Additional problems Acute kidney injury -Likely in the setting of dehydration, improved with fluids   COPD -Stable, no wheezing, continue home medications   Tobacco abuse, in remission -He quit just a few days ago prior to admission, strongly counseled to maintain cessation  Hypertension -Continue Norvasc, blood pressure is at  goal  Hyperkalemia -Improved with fluids   Scheduled Meds: . amLODipine  10 mg Oral Daily  . heparin  5,000 Units Subcutaneous Q8H  . insulin aspart  0-15 Units Subcutaneous TID WC  . insulin aspart  0-5 Units Subcutaneous QHS  . insulin aspart  3 Units Subcutaneous TID WC  . insulin glargine  18 Units Subcutaneous Daily  . mometasone-formoterol  2 puff Inhalation BID  . sodium chloride flush  3 mL Intravenous Once   Continuous Infusions: PRN Meds:.albuterol, oxyCODONE-acetaminophen **AND** oxyCODONE  DVT prophylaxis: heparin Code Status: Full  Family Communication: sister and wife at bedside  Disposition Plan: home in am if CBGs stable   Consultants:   None   Procedures:   None   Antimicrobials:  None    Objective: Vitals:   08/28/18 0800 08/28/18 0814 08/28/18 1100 08/28/18 1211  BP:  111/84  134/77  Pulse: 72 60 77 71  Resp:  20  20  Temp:  98.2 F (36.8 C)  97.7 F (36.5 C)  TempSrc:  Oral  Oral  SpO2: 91% 98% (!) 89%   Weight:      Height:        Intake/Output Summary (Last 24 hours) at 08/28/2018 1523 Last data filed at 08/28/2018 1300 Gross per 24 hour  Intake 660 ml  Output 975 ml  Net -315 ml   Filed Weights   08/27/18 0545  Weight: 107.2 kg    Examination:  Constitutional: NAD Eyes: PERRL, lids and conjunctivae normal ENMT: Mucous membranes are moist. No oropharyngeal exudates Respiratory: clear to auscultation bilaterally, no wheezing, no crackles. Normal respiratory effort.  Cardiovascular: Regular rate and rhythm, no murmurs /  rubs / gallops. No LE edema. Abdomen: no tenderness. Bowel sounds positive.  Musculoskeletal: no clubbing / cyanosis.  Skin: no rashes Neurologic: CN 2-12 grossly intact. Strength 5/5 in all 4.  Psychiatric: Normal judgment and insight. Alert and oriented x 3. Normal mood.    Data Reviewed: I have independently reviewed following labs and imaging studies   CBC: Recent Labs  Lab 08/26/18 2008  08/26/18 2321 08/28/18 0545  WBC 9.3  --  7.7  HGB 16.4 17.0 15.0  HCT 51.1 50.0 45.9  MCV 93.9  --  94.1  PLT 229  --  570   Basic Metabolic Panel: Recent Labs  Lab 08/27/18 0405 08/27/18 0657 08/27/18 1142 08/27/18 1630 08/28/18 0545  NA 139 141 139 139 137  K 4.5 4.2 4.1 3.2* 3.8  CL 103 104 106 103 99  CO2 23 25 22 26 24   GLUCOSE 417* 257* 249* 131* 264*  BUN 30* 27* 24* 20 16  CREATININE 1.64* 1.59* 1.36* 1.18 1.27*  CALCIUM 10.1 10.5* 9.8 9.7 9.6   GFR: Estimated Creatinine Clearance: 71.6 mL/min (A) (by C-G formula based on SCr of 1.27 mg/dL (H)). Liver Function Tests: Recent Labs  Lab 08/26/18 2008  AST 27  ALT 34  ALKPHOS 180*  BILITOT 1.2  PROT 9.2*  ALBUMIN 4.8   Recent Labs  Lab 08/26/18 2008  LIPASE 59*   No results for input(s): AMMONIA in the last 168 hours. Coagulation Profile: No results for input(s): INR, PROTIME in the last 168 hours. Cardiac Enzymes: No results for input(s): CKTOTAL, CKMB, CKMBINDEX, TROPONINI in the last 168 hours. BNP (last 3 results) No results for input(s): PROBNP in the last 8760 hours. HbA1C: Recent Labs    08/27/18 0919  HGBA1C 11.3*   CBG: Recent Labs  Lab 08/28/18 0330 08/28/18 0811 08/28/18 1127 08/28/18 1159 08/28/18 1502  GLUCAP 312* 249* 388* 394* 388*   Lipid Profile: No results for input(s): CHOL, HDL, LDLCALC, TRIG, CHOLHDL, LDLDIRECT in the last 72 hours. Thyroid Function Tests: No results for input(s): TSH, T4TOTAL, FREET4, T3FREE, THYROIDAB in the last 72 hours. Anemia Panel: No results for input(s): VITAMINB12, FOLATE, FERRITIN, TIBC, IRON, RETICCTPCT in the last 72 hours. Urine analysis:    Component Value Date/Time   COLORURINE STRAW (A) 08/26/2018 2049   APPEARANCEUR CLEAR 08/26/2018 2049   LABSPEC 1.025 08/26/2018 2049   PHURINE 5.0 08/26/2018 2049   GLUCOSEU >=500 (A) 08/26/2018 2049   HGBUR MODERATE (A) 08/26/2018 2049   BILIRUBINUR NEGATIVE 08/26/2018 2049   KETONESUR  NEGATIVE 08/26/2018 2049   PROTEINUR NEGATIVE 08/26/2018 2049   UROBILINOGEN 1.0 02/14/2012 0350   NITRITE NEGATIVE 08/26/2018 2049   LEUKOCYTESUR NEGATIVE 08/26/2018 2049   Sepsis Labs: Invalid input(s): PROCALCITONIN, LACTICIDVEN  Recent Results (from the past 240 hour(s))  MRSA PCR Screening     Status: None   Collection Time: 08/27/18  5:48 AM  Result Value Ref Range Status   MRSA by PCR NEGATIVE NEGATIVE Final    Comment:        The GeneXpert MRSA Assay (FDA approved for NASAL specimens only), is one component of a comprehensive MRSA colonization surveillance program. It is not intended to diagnose MRSA infection nor to guide or monitor treatment for MRSA infections. Performed at [facility], [place] [address] [place], [place] [address]       Radiology Studies: Dg Chest 2 View  Result Date: 08/27/2018 CLINICAL DATA:  63 year old male with cough. EXAM: CHEST - 2 VIEW COMPARISON:  Chest radiograph dated 10/17/2017 FINDINGS: The lungs are clear. There is no pleural effusion or pneumothorax. The cardiac silhouette is within normal limits. No acute osseous pathology. Stimulator wire noted over the lower thoracic spine. IMPRESSION: No active cardiopulmonary disease. Electronically Signed   By: Anner Crete M.D.   On: 08/27/2018 00:26    Marzetta Board, MD, PhD [facility]  Contact via  [email or web address]  Mercedes P: [phone]  F: [phone]

## 2018-08-28 NOTE — Progress Notes (Signed)
Patient refused CPAP. States he does not use at home. Patient understands he would like to use CPAP to call for Respiratory.

## 2018-08-28 NOTE — Care Management (Addendum)
#    2.   S/W  [facility] @ [place] # [phone]  1. LANTUE FLEX PEN 18 UNITS A DAY     COVER- YES                                                         CO-PAY- 25 % OF TOTAL COST  ( VIAL SAME ) TIER-  3 DRUG PRIOR APPROVAL- NO  2. NOVOLOG FLEX PEN  3-4 UNITS  WITH EACH MEAL   COVER- YES CO-PAY- 25 % OF TOTAL COST    ( VIAL  SAME ) TIER- 3 DRUG PRIOR APPROVAL- NO  NO DEDUCTIBLE  PREFERRED PHARMACY : YES [facility]  AND [facility]   SECONDARY INS : MEDICAID [place] ACCESS EFF-DATE: 08-04-2016 CO-PAY- $ 3.90 FOR EACH PRESCRIPTION

## 2018-08-28 NOTE — Progress Notes (Signed)
Patient self-administered lunchtime Novolog.  Patient demonstrated appropriate technique.  Will continue teaching measures with use of glucometer and self-administration of insulin.

## 2018-08-28 NOTE — Care Management Note (Addendum)
Case Management Note  Patient Details  Name: Peter Manning MRN: [id number] Date of Birth: [birth year]  Subjective/Objective:  Pt admitted on 08/27/18 with abdominal discomfort, malaise and N/V; found to have serum glucose >1000.  Pt diagnosed with new onset DM, AKI, and hyperkalemia.  PTA, pt independent, lives alone.                Action/Plan: CM consult for benefits check for insulin pens.  Pt has Medicaid in addition to his primary insurance, which he uses for Rx meds.  Lantus and Novolog pens are covered by Medicaid, and pt will be able to obtain them for $3.90/Rx.  He is open to OP diabetic teaching, and understands he will need to purchase meter, strips and lancets to check CBGs.  Recommended [employer] Reli-On Meter and products, if cost is an issue.  Pt states that his wife will assist him with all the new changes he is going through.  Updated MD on insulin pen coverage.  Pt's PCP is Dr. Dustin Folks.  MD: please leave Rx for meter, strips and lancets for pt.  Thank you.  Expected Discharge Date:                  Expected Discharge Plan:  Home/Self Care  In-House Referral:     Discharge planning Services  CM Consult, Medication Assistance  Post Acute Care Choice:    Choice offered to:     DME Arranged:    DME Agency:     HH Arranged:    HH Agency:     Status of Service:  Completed, signed off  If discussed at [employer] of Stay Meetings, dates discussed:    Additional Comments:  Reinaldo Raddle, RN, BSN  Trauma/Neuro ICU Case Manager [phone]

## 2018-08-28 NOTE — Progress Notes (Signed)
RT Note:  Patient refused CPAP at this time. Patient appears sleepy but arouseable.  Spo2 94% on RA.

## 2018-08-28 NOTE — Progress Notes (Signed)
Inpatient Diabetes Program Recommendations  AACE/ADA: New Consensus Statement on Inpatient Glycemic Control (2015)  Target Ranges:  Prepandial:   less than 140 mg/dL      Peak postprandial:   less than 180 mg/dL (1-2 hours)      Critically ill patients:  140 - 180 mg/dL   Lab Results  Component Value Date   GLUCAP 394 (H) 08/28/2018   HGBA1C 11.3 (H) 08/27/2018    Review of Glycemic Control  Blood sugars 125 - 312, 264, 249, 394 mgdL Needs insulin adjustment. Per care management - insulin pens are covered for $4 copay  Inpatient Diabetes Program Recommendations:     Add meal coverage insulin - Novolog 4 units tidwc  Educated patient, spouse, and sister on insulin pen use at home. Reviewed contents of insulin flexpen starter kit. Reviewed all steps if insulin pen including attachment of needle, 2-unit air shot, dialing up dose, giving injection, removing needle, disposal of sharps, storage of unused insulin, disposal of insulin etc. Patient able to provide successful return demonstration. Also reviewed troubleshooting with insulin pen. MD to give patient Rxs for insulin pens and insulin pen needles.  Long discussion regarding diet and exercise, choosing healthier foods and being mindful of portion control.  Will followup in am.  Thank you. Lorenda Peck, RD, LDN, CDE Inpatient Diabetes Coordinator [phone]

## 2018-08-29 ENCOUNTER — Encounter: Payer: Self-pay | Admitting: Internal Medicine

## 2018-08-29 DIAGNOSIS — R739 Hyperglycemia, unspecified: Secondary | ICD-10-CM

## 2018-08-29 DIAGNOSIS — E1101 Type 2 diabetes mellitus with hyperosmolarity with coma: Principal | ICD-10-CM

## 2018-08-29 DIAGNOSIS — N179 Acute kidney failure, unspecified: Secondary | ICD-10-CM

## 2018-08-29 DIAGNOSIS — E1111 Type 2 diabetes mellitus with ketoacidosis with coma: Secondary | ICD-10-CM

## 2018-08-29 DIAGNOSIS — E875 Hyperkalemia: Secondary | ICD-10-CM

## 2018-08-29 LAB — GLUCOSE, CAPILLARY
Glucose-Capillary: 199 mg/dL — ABNORMAL HIGH (ref 70–99)
Glucose-Capillary: 409 mg/dL — ABNORMAL HIGH (ref 70–99)
Glucose-Capillary: 346 mg/dL — ABNORMAL HIGH (ref 70–99)
Glucose-Capillary: 437 mg/dL — ABNORMAL HIGH (ref 70–99)
Glucose-Capillary: 382 mg/dL — ABNORMAL HIGH (ref 70–99)
Glucose-Capillary: 233 mg/dL — ABNORMAL HIGH (ref 70–99)

## 2018-08-29 MED ORDER — INSULIN GLARGINE 100 UNIT/ML ~~LOC~~ SOLN
15.0000 [IU] | Freq: Two times a day (BID) | SUBCUTANEOUS | Status: DC
  Administered 2018-08-29: 15 [IU] via SUBCUTANEOUS
  Filled 2018-08-29 (×2): qty 0.15

## 2018-08-29 MED ORDER — INSULIN PEN NEEDLE 31G X 6 MM MISC: 1.0000 | Freq: Two times a day (BID) | 3 refills | Status: DC

## 2018-08-29 MED ORDER — INSULIN DETEMIR 100 UNIT/ML FLEXPEN: 30.0000 [IU] | PEN_INJECTOR | Freq: Every day | SUBCUTANEOUS | 11 refills | Status: DC

## 2018-08-29 MED ORDER — BLOOD GLUCOSE MONITOR KIT: PACK | 0 refills | Status: DC

## 2018-08-29 MED ORDER — METFORMIN HCL 500 MG PO TABS: 500.0000 mg | ORAL_TABLET | Freq: Two times a day (BID) | ORAL | 11 refills | Status: DC

## 2018-08-29 MED ORDER — INSULIN ASPART 100 UNIT/ML ~~LOC~~ SOLN: 3.0000 [IU] | Freq: Three times a day (TID) | SUBCUTANEOUS | 11 refills | Status: DC

## 2018-08-29 MED ORDER — INSULIN ASPART 100 UNIT/ML CARTRIDGE (PENFILL): 3.0000 [IU] | Freq: Three times a day (TID) | SUBCUTANEOUS | 11 refills | Status: DC

## 2018-08-29 NOTE — Progress Notes (Addendum)
Inpatient Diabetes Program Recommendations  AACE/ADA: New Consensus Statement on Inpatient Glycemic Control (2015)  Target Ranges:  Prepandial:   less than 140 mg/dL      Peak postprandial:   less than 180 mg/dL (1-2 hours)      Critically ill patients:  140 - 180 mg/dL   Lab Results  Component Value Date   GLUCAP 382 (H) 08/29/2018   HGBA1C 11.3 (H) 08/27/2018    Review of Glycemic Control  Blood sugars today - FBS - 199. CBGs 409, 346, 437, 382 mg/dL FBS much improved with Lantus 18 units QHS.      Inpatient Diabetes Program Recommendations:    For discharge:  Lantus 22 units QHS  (0.2/kg) Novolog 0-15 units tidwc + 6 units tidwc. Metformin 500 mg bid  Pt willing to give 4 shots/day.  Reviewed basal/bolus insulin administration and emphasized importance of diet adherance.  Discussed insulin timing with RN.  Thank you. Lorenda Peck, RD, LDN, CDE Inpatient Diabetes Coordinator [phone]

## 2018-08-29 NOTE — Plan of Care (Signed)
Patient refuses to learn about substitutions for snacks. Patient states "I do not want to try anything new that I do not know about and I do not want to hear about anything new to try". Patient continues to call his family and ask to bring in outside food that he "likes". RN has educated patient although patient  continues to be non-receptive. RN will continue to monitor and educate patient and family.

## 2018-08-29 NOTE — Discharge Summary (Addendum)
Physician Discharge Summary  Peter Manning MWU:[id number] DOB: 06/22/1956 DOA: 08/26/2018  PCP: Nolene Ebbs, MD  Admit date: 08/26/2018 Discharge date: 08/29/2018  Admitted From: Home Disposition:  Home  Recommendations for Outpatient Follow-up:  1. Follow up with PCP in 1-2 weeks   Home Health:No Equipment/Devices:None  Discharge Condition:stable CODE STATUS:Full Diet recommendation: Heart Healthy / Carb Modified / Regular / Dysphagia   Brief/Interim Summary: 63 year old with past medical history of COPD who just quit smoking, essential hypertension came into the ED with generalized malaise abdominal discomfort nausea vomiting polyuria and polydipsia was found with a blood glucose greater than thousand.  Discharge Diagnoses:  Principal Problem:   Hyperosmolar hyperglycemic coma due to diabetes mellitus without ketoacidosis (HCC) Active Problems:   OSA (obstructive sleep apnea)   Chronic bronchitis (HCC)   AKI (acute kidney injury) (HCC)   Hyperkalemia Hyperosmolar hyperglycemic coma due to diabetes mellitus without ketosis: He was admitted to stepdown started on IV insulin and IV fluids his blood sugar came down he was transition to long-acting insulin plus sliding scale. His A1c was 11. Diabetes coordinator was consulted. He was started on oral metformin he will go home on Lantus 30 units plus NovoLog 3 units with meals.  Acute kidney injury: Likely prerenal azotemia resolved with IV fluid hydration.  COPD: Stable continue current medications no changes made.  Essential hypertension: No changes made to his medication.  Hyperkalemia: Resolved with IV fluid hydration likely due to hypovolemia.   Discharge Instructions  Discharge Instructions    Diet - low sodium heart healthy   Complete by:  As directed    Increase activity slowly   Complete by:  As directed      Allergies as of 08/29/2018   No Known Allergies     Medication List    TAKE these  medications   albuterol 108 (90 Base) MCG/ACT inhaler Commonly known as:  PROVENTIL HFA;VENTOLIN HFA Inhale 2 puffs into the lungs every 4 (four) hours as needed for wheezing or shortness of breath.   amLODipine 10 MG tablet Commonly known as:  NORVASC Take 10 mg by mouth daily.   aspirin 325 MG EC tablet Take 1 tablet (325 mg total) by mouth daily with breakfast.   blood glucose meter kit and supplies Kit Dispense based on patient and insurance preference. Use up to four times daily as directed. (FOR ICD-9 250.00, 250.01).   hydrOXYzine 50 MG tablet Commonly known as:  ATARAX/VISTARIL Take 50 mg by mouth every 8 (eight) hours as needed for anxiety.   insulin aspart cartridge Commonly known as:  NOVOLOG PENFILL Inject 3 Units into the skin 3 (three) times daily with meals.   Insulin Detemir 100 UNIT/ML Pen Commonly known as:  LEVEMIR Inject 30 Units into the skin daily.   Insulin Pen Needle 31G X 6 MM Misc 1 Device by Does not apply route 2 (two) times daily.   metFORMIN 500 MG tablet Commonly known as:  GLUCOPHAGE Take 1 tablet (500 mg total) by mouth 2 (two) times daily with a meal.   oxyCODONE-acetaminophen 10-325 MG tablet Commonly known as:  PERCOCET Take 1 tablet by mouth every 6 (six) hours as needed for pain.   polyethylene glycol packet Commonly known as:  MIRALAX / GLYCOLAX Take 17 g by mouth daily as needed for mild constipation.   potassium chloride SA 20 MEQ tablet Commonly known as:  K-DUR,KLOR-CON Take 1 tablet (20 mEq total) by mouth daily.   sertraline 50 MG tablet Commonly known as:  ZOLOFT Take 50 mg by mouth daily.   SYMBICORT 160-4.5 MCG/ACT inhaler Generic drug:  budesonide-formoterol Inhale 2 puffs into the lungs daily as needed (shortness of breath).   traZODone 50 MG tablet Commonly known as:  DESYREL Take 50 mg by mouth at bedtime as needed for sleep.       No Known Allergies  Consultations:  None   Procedures/Studies: Dg  Chest 2 View  Result Date: 08/27/2018 CLINICAL DATA:  63 year old male with cough. EXAM: CHEST - 2 VIEW COMPARISON:  Chest radiograph dated 10/17/2017 FINDINGS: The lungs are clear. There is no pleural effusion or pneumothorax. The cardiac silhouette is within normal limits. No acute osseous pathology. Stimulator wire noted over the lower thoracic spine. IMPRESSION: No active cardiopulmonary disease. Electronically Signed   By: Anner Crete M.D.   On: 08/27/2018 00:26    Subjective: No complaints.  Discharge Exam: Vitals:   08/29/18 0820 08/29/18 0927  BP:  113/67  Pulse: 73   Resp: 14   Temp:    SpO2: 98%      General: Pt is alert, awake, not in acute distress Cardiovascular: RRR, S1/S2 +, no rubs, no gallops Respiratory: CTA bilaterally, no wheezing, no rhonchi Abdominal: Soft, NT, ND, bowel sounds + Extremities: no edema, no cyanosis    The results of significant diagnostics from this hospitalization (including imaging, microbiology, ancillary and laboratory) are listed below for reference.     Microbiology: Recent Results (from the past 240 hour(s))  MRSA PCR Screening     Status: None   Collection Time: 08/27/18  5:48 AM  Result Value Ref Range Status   MRSA by PCR NEGATIVE NEGATIVE Final    Comment:        The GeneXpert MRSA Assay (FDA approved for NASAL specimens only), is one component of a comprehensive MRSA colonization surveillance program. It is not intended to diagnose MRSA infection nor to guide or monitor treatment for MRSA infections. Performed at [facility], [place] [address], [place], [place] [address]      Labs: BNP (last 3 results) No results for input(s): BNP in the last 8760 hours. Basic Metabolic Panel: Recent Labs  Lab 08/27/18 0405 08/27/18 0657 08/27/18 1142 08/27/18 1630 08/28/18 0545  NA 139 141 139 139 137  K 4.5 4.2 4.1 3.2* 3.8  CL 103 104 106 103 99  CO2 '23 25 22 26 24  '$ GLUCOSE 417* 257* 249* 131* 264*  BUN 30*  27* 24* 20 16  CREATININE 1.64* 1.59* 1.36* 1.18 1.27*  CALCIUM 10.1 10.5* 9.8 9.7 9.6   Liver Function Tests: Recent Labs  Lab 08/26/18 2008  AST 27  ALT 34  ALKPHOS 180*  BILITOT 1.2  PROT 9.2*  ALBUMIN 4.8   Recent Labs  Lab 08/26/18 2008  LIPASE 59*   No results for input(s): AMMONIA in the last 168 hours. CBC: Recent Labs  Lab 08/26/18 2008 08/26/18 2321 08/28/18 0545  WBC 9.3  --  7.7  HGB 16.4 17.0 15.0  HCT 51.1 50.0 45.9  MCV 93.9  --  94.1  PLT 229  --  170   Cardiac Enzymes: No results for input(s): CKTOTAL, CKMB, CKMBINDEX, TROPONINI in the last 168 hours. BNP: Invalid input(s): POCBNP CBG: Recent Labs  Lab 08/28/18 1159 08/28/18 1502 08/28/18 1657 08/28/18 2135 08/29/18 0730  GLUCAP 394* 388* 286* 237* 199*   D-Dimer No results for input(s): DDIMER in the last 72 hours. Hgb A1c Recent Labs    08/27/18 0919  HGBA1C 11.3*   Lipid Profile No results for input(s): CHOL, HDL, LDLCALC, TRIG, CHOLHDL, LDLDIRECT in the last 72 hours. Thyroid function studies No results for input(s): TSH, T4TOTAL, T3FREE, THYROIDAB in the last 72 hours.  Invalid input(s): FREET3 Anemia work up No results for input(s): VITAMINB12, FOLATE, FERRITIN, TIBC, IRON, RETICCTPCT in the last 72 hours. Urinalysis    Component Value Date/Time   COLORURINE STRAW (A) 08/26/2018 2049   APPEARANCEUR CLEAR 08/26/2018 2049   LABSPEC 1.025 08/26/2018 2049   PHURINE 5.0 08/26/2018 2049   GLUCOSEU >=500 (A) 08/26/2018 2049   HGBUR MODERATE (A) 08/26/2018 2049   BILIRUBINUR NEGATIVE 08/26/2018 2049   KETONESUR NEGATIVE 08/26/2018 2049   PROTEINUR NEGATIVE 08/26/2018 2049   UROBILINOGEN 1.0 02/14/2012 0350   NITRITE NEGATIVE 08/26/2018 2049   LEUKOCYTESUR NEGATIVE 08/26/2018 2049   Sepsis Labs Invalid input(s): PROCALCITONIN,  WBC,  LACTICIDVEN Microbiology Recent Results (from the past 240 hour(s))  MRSA PCR Screening     Status: None   Collection Time: 08/27/18  5:48  AM  Result Value Ref Range Status   MRSA by PCR NEGATIVE NEGATIVE Final    Comment:        The GeneXpert MRSA Assay (FDA approved for NASAL specimens only), is one component of a comprehensive MRSA colonization surveillance program. It is not intended to diagnose MRSA infection nor to guide or monitor treatment for MRSA infections. Performed at [facility], [place] [address] [place], [place] [address]      Time coordinating discharge: 40 minutes  SIGNED:   Charlynne Cousins, MD  [facility]

## 2018-08-29 NOTE — Progress Notes (Signed)
Patient did not get breakfast until 930 am and he refused lantus because he was hungry, itchy and wanted to shower. Before lunch his blood sugar went up to 409. I gave him 18 units of insulin.  I took his blood sugar again an hour later and it is still 346.  So I administered the lantus at 1245.  Patient is supposed to be discharged.

## 2018-11-02 DIAGNOSIS — N200 Calculus of kidney: Secondary | ICD-10-CM | POA: Diagnosis not present

## 2018-11-03 DIAGNOSIS — N200 Calculus of kidney: Secondary | ICD-10-CM | POA: Diagnosis not present

## 2018-11-04 DIAGNOSIS — N200 Calculus of kidney: Secondary | ICD-10-CM | POA: Diagnosis not present

## 2018-11-05 DIAGNOSIS — N200 Calculus of kidney: Secondary | ICD-10-CM | POA: Diagnosis not present

## 2018-11-06 DIAGNOSIS — N200 Calculus of kidney: Secondary | ICD-10-CM | POA: Diagnosis not present

## 2018-11-07 DIAGNOSIS — N200 Calculus of kidney: Secondary | ICD-10-CM | POA: Diagnosis not present

## 2018-11-08 DIAGNOSIS — N200 Calculus of kidney: Secondary | ICD-10-CM | POA: Diagnosis not present

## 2018-11-09 DIAGNOSIS — N200 Calculus of kidney: Secondary | ICD-10-CM | POA: Diagnosis not present

## 2018-11-10 DIAGNOSIS — N200 Calculus of kidney: Secondary | ICD-10-CM | POA: Diagnosis not present

## 2018-11-11 DIAGNOSIS — N200 Calculus of kidney: Secondary | ICD-10-CM | POA: Diagnosis not present

## 2018-11-12 DIAGNOSIS — N200 Calculus of kidney: Secondary | ICD-10-CM | POA: Diagnosis not present

## 2018-11-13 DIAGNOSIS — N200 Calculus of kidney: Secondary | ICD-10-CM | POA: Diagnosis not present

## 2018-11-13 DIAGNOSIS — E119 Type 2 diabetes mellitus without complications: Secondary | ICD-10-CM | POA: Diagnosis not present

## 2018-11-13 DIAGNOSIS — Z79891 Long term (current) use of opiate analgesic: Secondary | ICD-10-CM | POA: Diagnosis not present

## 2018-11-13 DIAGNOSIS — M25512 Pain in left shoulder: Secondary | ICD-10-CM | POA: Diagnosis not present

## 2018-11-13 DIAGNOSIS — Z9889 Other specified postprocedural states: Secondary | ICD-10-CM | POA: Diagnosis not present

## 2018-11-13 DIAGNOSIS — M25562 Pain in left knee: Secondary | ICD-10-CM | POA: Diagnosis not present

## 2018-11-13 DIAGNOSIS — M545 Low back pain: Secondary | ICD-10-CM | POA: Diagnosis not present

## 2018-11-14 DIAGNOSIS — N200 Calculus of kidney: Secondary | ICD-10-CM | POA: Diagnosis not present

## 2018-11-14 DIAGNOSIS — M545 Low back pain: Secondary | ICD-10-CM | POA: Diagnosis not present

## 2018-11-14 DIAGNOSIS — Z79891 Long term (current) use of opiate analgesic: Secondary | ICD-10-CM | POA: Diagnosis not present

## 2018-11-14 DIAGNOSIS — M25562 Pain in left knee: Secondary | ICD-10-CM | POA: Diagnosis not present

## 2018-11-14 DIAGNOSIS — Z9889 Other specified postprocedural states: Secondary | ICD-10-CM | POA: Diagnosis not present

## 2018-11-14 DIAGNOSIS — M25512 Pain in left shoulder: Secondary | ICD-10-CM | POA: Diagnosis not present

## 2018-11-15 DIAGNOSIS — N200 Calculus of kidney: Secondary | ICD-10-CM | POA: Diagnosis not present

## 2018-11-16 DIAGNOSIS — N200 Calculus of kidney: Secondary | ICD-10-CM | POA: Diagnosis not present

## 2018-11-17 DIAGNOSIS — N200 Calculus of kidney: Secondary | ICD-10-CM | POA: Diagnosis not present

## 2018-11-18 DIAGNOSIS — N200 Calculus of kidney: Secondary | ICD-10-CM | POA: Diagnosis not present

## 2018-11-19 DIAGNOSIS — N200 Calculus of kidney: Secondary | ICD-10-CM | POA: Diagnosis not present

## 2018-11-20 DIAGNOSIS — N200 Calculus of kidney: Secondary | ICD-10-CM | POA: Diagnosis not present

## 2018-11-21 DIAGNOSIS — M545 Low back pain: Secondary | ICD-10-CM | POA: Diagnosis not present

## 2018-11-21 DIAGNOSIS — Z9889 Other specified postprocedural states: Secondary | ICD-10-CM | POA: Diagnosis not present

## 2018-11-21 DIAGNOSIS — M25512 Pain in left shoulder: Secondary | ICD-10-CM | POA: Diagnosis not present

## 2018-11-21 DIAGNOSIS — Z79891 Long term (current) use of opiate analgesic: Secondary | ICD-10-CM | POA: Diagnosis not present

## 2018-11-21 DIAGNOSIS — N200 Calculus of kidney: Secondary | ICD-10-CM | POA: Diagnosis not present

## 2018-11-21 DIAGNOSIS — Z96659 Presence of unspecified artificial knee joint: Secondary | ICD-10-CM | POA: Diagnosis not present

## 2018-11-22 DIAGNOSIS — N200 Calculus of kidney: Secondary | ICD-10-CM | POA: Diagnosis not present

## 2018-11-23 DIAGNOSIS — N200 Calculus of kidney: Secondary | ICD-10-CM | POA: Diagnosis not present

## 2018-11-24 DIAGNOSIS — N200 Calculus of kidney: Secondary | ICD-10-CM | POA: Diagnosis not present

## 2018-11-25 DIAGNOSIS — N200 Calculus of kidney: Secondary | ICD-10-CM | POA: Diagnosis not present

## 2018-11-26 DIAGNOSIS — N200 Calculus of kidney: Secondary | ICD-10-CM | POA: Diagnosis not present

## 2018-11-27 DIAGNOSIS — N200 Calculus of kidney: Secondary | ICD-10-CM | POA: Diagnosis not present

## 2018-11-28 DIAGNOSIS — N200 Calculus of kidney: Secondary | ICD-10-CM | POA: Diagnosis not present

## 2018-11-29 DIAGNOSIS — N200 Calculus of kidney: Secondary | ICD-10-CM | POA: Diagnosis not present

## 2018-11-30 DIAGNOSIS — N200 Calculus of kidney: Secondary | ICD-10-CM | POA: Diagnosis not present

## 2018-12-01 DIAGNOSIS — N200 Calculus of kidney: Secondary | ICD-10-CM | POA: Diagnosis not present

## 2018-12-02 DIAGNOSIS — N200 Calculus of kidney: Secondary | ICD-10-CM | POA: Diagnosis not present

## 2018-12-03 DIAGNOSIS — N200 Calculus of kidney: Secondary | ICD-10-CM | POA: Diagnosis not present

## 2018-12-04 DIAGNOSIS — N200 Calculus of kidney: Secondary | ICD-10-CM | POA: Diagnosis not present

## 2018-12-05 DIAGNOSIS — N200 Calculus of kidney: Secondary | ICD-10-CM | POA: Diagnosis not present

## 2018-12-06 DIAGNOSIS — N200 Calculus of kidney: Secondary | ICD-10-CM | POA: Diagnosis not present

## 2018-12-07 DIAGNOSIS — N200 Calculus of kidney: Secondary | ICD-10-CM | POA: Diagnosis not present

## 2018-12-08 DIAGNOSIS — H547 Unspecified visual loss: Secondary | ICD-10-CM | POA: Diagnosis not present

## 2018-12-08 DIAGNOSIS — E119 Type 2 diabetes mellitus without complications: Secondary | ICD-10-CM | POA: Diagnosis not present

## 2018-12-08 DIAGNOSIS — Z720 Tobacco use: Secondary | ICD-10-CM | POA: Diagnosis not present

## 2018-12-08 DIAGNOSIS — K219 Gastro-esophageal reflux disease without esophagitis: Secondary | ICD-10-CM | POA: Diagnosis not present

## 2018-12-08 DIAGNOSIS — G8929 Other chronic pain: Secondary | ICD-10-CM | POA: Diagnosis not present

## 2018-12-08 DIAGNOSIS — M1990 Unspecified osteoarthritis, unspecified site: Secondary | ICD-10-CM | POA: Diagnosis not present

## 2018-12-08 DIAGNOSIS — I10 Essential (primary) hypertension: Secondary | ICD-10-CM | POA: Diagnosis not present

## 2018-12-08 DIAGNOSIS — N200 Calculus of kidney: Secondary | ICD-10-CM | POA: Diagnosis not present

## 2018-12-08 DIAGNOSIS — Z7984 Long term (current) use of oral hypoglycemic drugs: Secondary | ICD-10-CM | POA: Diagnosis not present

## 2018-12-09 DIAGNOSIS — N200 Calculus of kidney: Secondary | ICD-10-CM | POA: Diagnosis not present

## 2018-12-10 DIAGNOSIS — N200 Calculus of kidney: Secondary | ICD-10-CM | POA: Diagnosis not present

## 2018-12-11 DIAGNOSIS — N200 Calculus of kidney: Secondary | ICD-10-CM | POA: Diagnosis not present

## 2018-12-12 DIAGNOSIS — N200 Calculus of kidney: Secondary | ICD-10-CM | POA: Diagnosis not present

## 2018-12-13 DIAGNOSIS — N200 Calculus of kidney: Secondary | ICD-10-CM | POA: Diagnosis not present

## 2018-12-14 DIAGNOSIS — N200 Calculus of kidney: Secondary | ICD-10-CM | POA: Diagnosis not present

## 2018-12-15 DIAGNOSIS — N200 Calculus of kidney: Secondary | ICD-10-CM | POA: Diagnosis not present

## 2018-12-16 DIAGNOSIS — N200 Calculus of kidney: Secondary | ICD-10-CM | POA: Diagnosis not present

## 2018-12-17 DIAGNOSIS — N200 Calculus of kidney: Secondary | ICD-10-CM | POA: Diagnosis not present

## 2018-12-18 DIAGNOSIS — N200 Calculus of kidney: Secondary | ICD-10-CM | POA: Diagnosis not present

## 2018-12-19 DIAGNOSIS — N200 Calculus of kidney: Secondary | ICD-10-CM | POA: Diagnosis not present

## 2018-12-20 DIAGNOSIS — N200 Calculus of kidney: Secondary | ICD-10-CM | POA: Diagnosis not present

## 2018-12-21 DIAGNOSIS — N200 Calculus of kidney: Secondary | ICD-10-CM | POA: Diagnosis not present

## 2018-12-22 DIAGNOSIS — M545 Low back pain: Secondary | ICD-10-CM | POA: Diagnosis not present

## 2018-12-22 DIAGNOSIS — N200 Calculus of kidney: Secondary | ICD-10-CM | POA: Diagnosis not present

## 2018-12-22 DIAGNOSIS — Z96659 Presence of unspecified artificial knee joint: Secondary | ICD-10-CM | POA: Diagnosis not present

## 2018-12-22 DIAGNOSIS — Z79891 Long term (current) use of opiate analgesic: Secondary | ICD-10-CM | POA: Diagnosis not present

## 2018-12-22 DIAGNOSIS — Z9889 Other specified postprocedural states: Secondary | ICD-10-CM | POA: Diagnosis not present

## 2018-12-22 DIAGNOSIS — M25512 Pain in left shoulder: Secondary | ICD-10-CM | POA: Diagnosis not present

## 2018-12-23 DIAGNOSIS — N200 Calculus of kidney: Secondary | ICD-10-CM | POA: Diagnosis not present

## 2018-12-24 DIAGNOSIS — N200 Calculus of kidney: Secondary | ICD-10-CM | POA: Diagnosis not present

## 2018-12-25 DIAGNOSIS — N200 Calculus of kidney: Secondary | ICD-10-CM | POA: Diagnosis not present

## 2018-12-26 DIAGNOSIS — N200 Calculus of kidney: Secondary | ICD-10-CM | POA: Diagnosis not present

## 2018-12-27 DIAGNOSIS — N200 Calculus of kidney: Secondary | ICD-10-CM | POA: Diagnosis not present

## 2018-12-28 DIAGNOSIS — N200 Calculus of kidney: Secondary | ICD-10-CM | POA: Diagnosis not present

## 2018-12-29 DIAGNOSIS — N200 Calculus of kidney: Secondary | ICD-10-CM | POA: Diagnosis not present

## 2018-12-30 DIAGNOSIS — N200 Calculus of kidney: Secondary | ICD-10-CM | POA: Diagnosis not present

## 2018-12-31 DIAGNOSIS — N200 Calculus of kidney: Secondary | ICD-10-CM | POA: Diagnosis not present

## 2019-01-01 DIAGNOSIS — N200 Calculus of kidney: Secondary | ICD-10-CM | POA: Diagnosis not present

## 2019-01-04 IMAGING — CR DG CHEST 2V
2 series · 2 of 2 positions shown · non-contrast
Comparison: Three hundred twenty-nine 1,018

CLINICAL DATA: Shortness of breath

EXAM:
CHEST  2 VIEW

[chest lat]
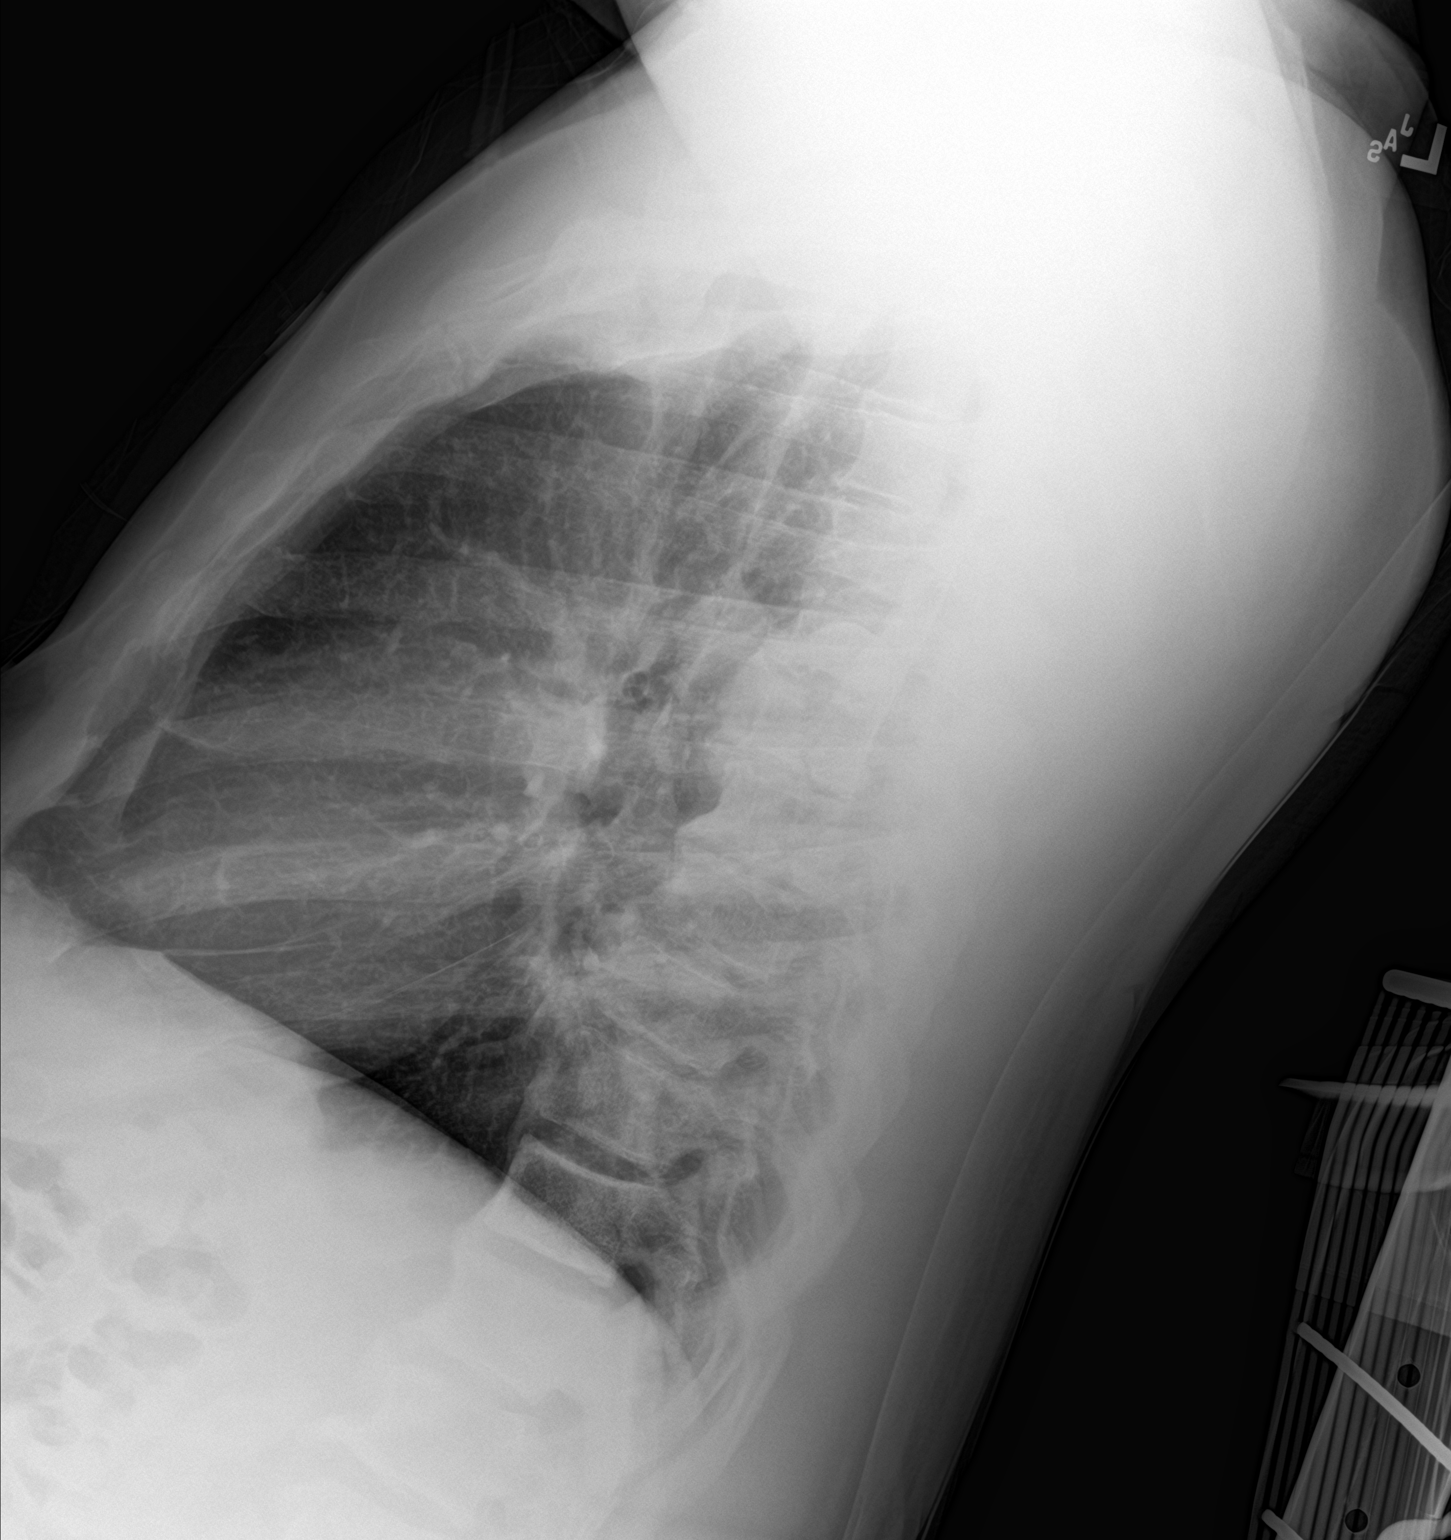

[chest ap]
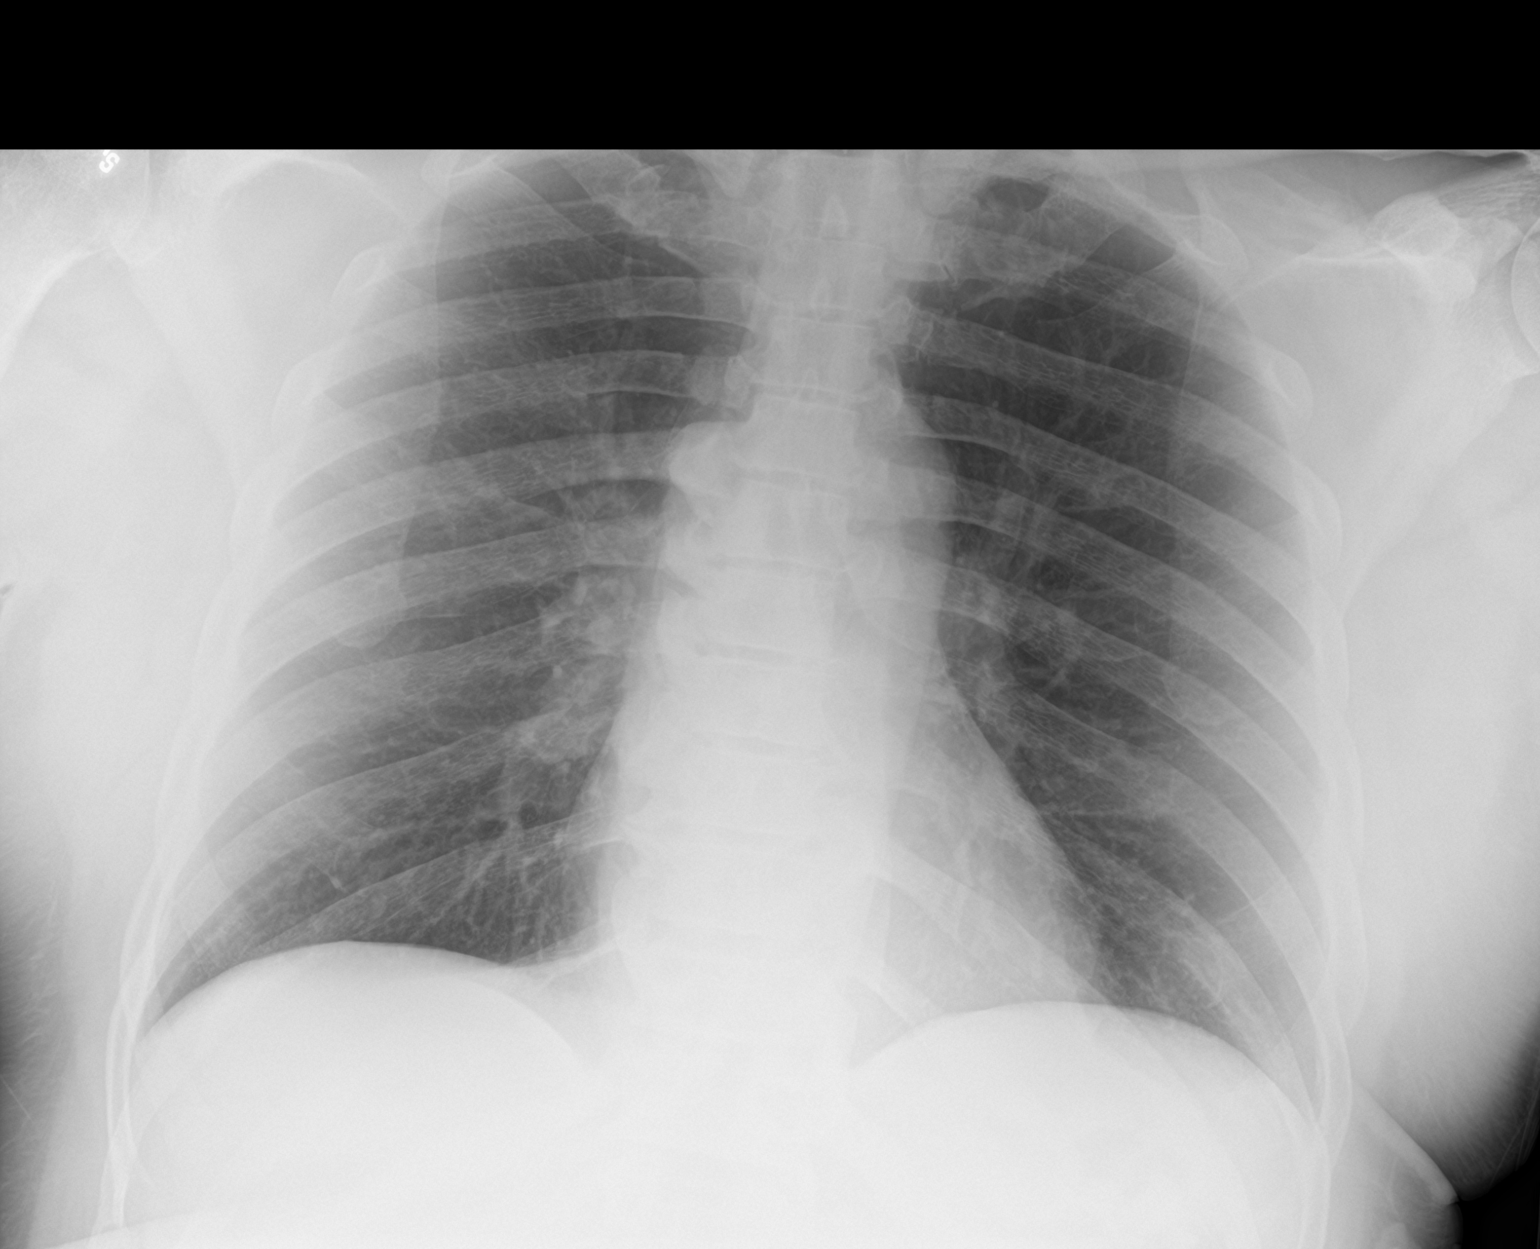

[2 of 2 positions shown; findings below may reference images not displayed]

FINDINGS: The heart size and mediastinal contours are within normal limits.
Both lungs are clear. Degenerative changes of the spine.
IMPRESSION: No active cardiopulmonary disease.

## 2019-11-03 ENCOUNTER — Emergency Department (HOSPITAL_COMMUNITY)
Admission: EM | Admit: 2019-11-03 | Discharge: 2019-11-03 | Disposition: A | Discharge status: Home / Self Care | Payer: Medicare Other | Attending: Emergency Medicine | Admitting: Emergency Medicine

## 2019-11-03 ENCOUNTER — Other Ambulatory Visit: Payer: Self-pay

## 2019-11-03 ENCOUNTER — Encounter (HOSPITAL_COMMUNITY): Payer: Self-pay | Admitting: Emergency Medicine

## 2019-11-03 DIAGNOSIS — I10 Essential (primary) hypertension: Secondary | ICD-10-CM | POA: Insufficient documentation

## 2019-11-03 DIAGNOSIS — Z79899 Other long term (current) drug therapy: Secondary | ICD-10-CM | POA: Insufficient documentation

## 2019-11-03 DIAGNOSIS — F17210 Nicotine dependence, cigarettes, uncomplicated: Secondary | ICD-10-CM | POA: Diagnosis not present

## 2019-11-03 DIAGNOSIS — Z794 Long term (current) use of insulin: Secondary | ICD-10-CM | POA: Diagnosis not present

## 2019-11-03 DIAGNOSIS — M62838 Other muscle spasm: Secondary | ICD-10-CM

## 2019-11-03 DIAGNOSIS — M542 Cervicalgia: Secondary | ICD-10-CM | POA: Diagnosis present

## 2019-11-03 LAB — CBC WITH DIFFERENTIAL/PLATELET
Abs Immature Granulocytes: 0.03 10*3/uL (ref 0.00–0.07)
Basophils Absolute: 0.1 10*3/uL (ref 0.0–0.1)
Basophils Relative: 1 %
Eosinophils Absolute: 0.3 10*3/uL (ref 0.0–0.5)
Eosinophils Relative: 3 %
HCT: 42.6 % (ref 39.0–52.0)
Hemoglobin: 14 g/dL (ref 13.0–17.0)
Immature Granulocytes: 0 %
Lymphocytes Relative: 32 %
Lymphs Abs: 2.7 10*3/uL (ref 0.7–4.0)
MCH: 32.2 pg (ref 26.0–34.0)
MCHC: 32.9 g/dL (ref 30.0–36.0)
MCV: 97.9 fL (ref 80.0–100.0)
Monocytes Absolute: 0.5 10*3/uL (ref 0.1–1.0)
Monocytes Relative: 7 %
Neutro Abs: 4.7 10*3/uL (ref 1.7–7.7)
Neutrophils Relative %: 57 %
Platelets: 264 10*3/uL (ref 150–400)
RBC: 4.35 MIL/uL (ref 4.22–5.81)
RDW: 12.6 % (ref 11.5–15.5)
WBC: 8.3 10*3/uL (ref 4.0–10.5)
nRBC: 0 % (ref 0.0–0.2)

## 2019-11-03 LAB — BASIC METABOLIC PANEL
Anion gap: 13 (ref 5–15)
BUN: 13 mg/dL (ref 8–23)
CO2: 24 mmol/L (ref 22–32)
Calcium: 9.9 mg/dL (ref 8.9–10.3)
Chloride: 103 mmol/L (ref 98–111)
Creatinine, Ser: 1.02 mg/dL (ref 0.61–1.24)
GFR calc Af Amer: >60 mL/min (ref >60)
GFR calc non Af Amer: >60 mL/min (ref >60)
Glucose, Bld: 94 mg/dL (ref 70–99)
Potassium: 3.1 mmol/L — ABNORMAL LOW (ref 3.5–5.1)
Sodium: 140 mmol/L (ref 135–145)

## 2019-11-03 MED ORDER — KETOROLAC TROMETHAMINE 60 MG/2ML IM SOLN
60.0000 mg | Freq: Once | INTRAMUSCULAR | Status: AC
  Administered 2019-11-03: 60 mg via INTRAMUSCULAR
  Filled 2019-11-03: qty 2

## 2019-11-03 MED ORDER — IBUPROFEN 800 MG PO TABS: 800.0000 mg | ORAL_TABLET | Freq: Three times a day (TID) | ORAL | 0 refills | Status: DC | PRN

## 2019-11-03 MED ORDER — METHOCARBAMOL 500 MG PO TABS: 500.0000 mg | ORAL_TABLET | Freq: Three times a day (TID) | ORAL | 0 refills | Status: DC | PRN

## 2019-11-03 NOTE — ED Triage Notes (Signed)
Pt c/o HA and right side neck pain for the past 2 weeks, pt denies any visual change or cp.

## 2019-11-03 NOTE — ED Provider Notes (Signed)
TIME SEEN: 6:02 AM  CHIEF COMPLAINT: Right-sided neck pain  HPI: Patient is a 64 year old male who presents to the emergency department with right-sided neck pain for the past 3 weeks.  No injury that he can recall.  Pain with palpation over the trapezius muscle and turning the neck.  No fevers.  No numbness, tingling or weakness.  He is able to ambulate.  No bowel or bladder incontinence or urinary retention.  Has not tried any medications at home.  ROS: See HPI Constitutional: no fever  Eyes: no drainage  ENT: no runny nose   Cardiovascular:  no chest pain  Resp: no SOB  GI: no vomiting GU: no dysuria Integumentary: no rash  Allergy: no hives  Musculoskeletal: no leg swelling  Neurological: no slurred speech ROS otherwise negative  PAST MEDICAL HISTORY/PAST SURGICAL HISTORY:  Past Medical History:  Diagnosis Date  . Bronchitis   . Depression   . Hepatitis 1976   pt. doesn't remember if A, B, C?  . History of kidney stones   . Hyperlipidemia   . Hypertension   . OSA (obstructive sleep apnea) 12/09/2011   PSG 01/03/12>>AHI 45.7, SpO2 low 73%, PLMI 0.  CPAP 15 cm H2O>>AHI 0, +R.   Marland Kitchen Pneumonia 2015  . Renal disorder    had kidney stone  . Sleep apnea    no CPAP ordered per patient/over 3 years    MEDICATIONS:  Prior to Admission medications   Medication Sig Start Date End Date Taking? Authorizing Provider  albuterol (PROVENTIL HFA;VENTOLIN HFA) 108 (90 Base) MCG/ACT inhaler Inhale 2 puffs into the lungs every 4 (four) hours as needed for wheezing or shortness of breath. 07/19/17   Lacretia Leigh, MD  amLODipine (NORVASC) 10 MG tablet Take 10 mg by mouth daily.  08/24/16   [provider]  aspirin EC 325 MG EC tablet Take 1 tablet (325 mg total) by mouth daily with breakfast. Patient not taking: Reported on 08/15/2018 10/12/16   Lanae Crumbly, PA-C  blood glucose meter kit and supplies KIT Dispense based on patient and insurance preference. Use up to four times daily as  directed. (FOR ICD-9 250.00, 250.01). 08/29/18   Charlynne Cousins, MD  hydrOXYzine (ATARAX/VISTARIL) 50 MG tablet Take 50 mg by mouth every 8 (eight) hours as needed for anxiety.  07/10/18   [provider]  insulin aspart (NOVOLOG PENFILL) cartridge Inject 3 Units into the skin 3 (three) times daily with meals. 08/29/18   Charlynne Cousins, MD  Insulin Detemir (LEVEMIR) 100 UNIT/ML Pen Inject 30 Units into the skin daily. 08/29/18   Charlynne Cousins, MD  Insulin Pen Needle 31G X 6 MM MISC 1 Device by Does not apply route 2 (two) times daily. 08/29/18   Charlynne Cousins, MD  metFORMIN (GLUCOPHAGE) 500 MG tablet Take 1 tablet (500 mg total) by mouth 2 (two) times daily with a meal. 08/29/18 08/29/19  Charlynne Cousins, MD  oxyCODONE-acetaminophen (PERCOCET) 10-325 MG tablet Take 1 tablet by mouth every 6 (six) hours as needed for pain. Patient not taking: Reported on 08/27/2018 07/06/17   [name], MD  polyethylene glycol [facility] / Floria Raveling) packet Take 17 g by mouth daily as needed for mild constipation. Patient not taking: Reported on 08/24/2018 10/12/16   Lanae Crumbly, PA-C  potassium chloride SA (K-DUR,KLOR-CON) 20 MEQ tablet Take 1 tablet (20 mEq total) by mouth daily. Patient not taking: Reported on 08/15/2018 12/04/16   Julianne Rice, MD  sertraline (ZOLOFT) 50  MG tablet Take 50 mg by mouth daily.  08/21/18   [provider]  SYMBICORT 160-4.5 MCG/ACT inhaler Inhale 2 puffs into the lungs daily as needed (shortness of breath).  08/05/18   [provider]  traZODone (DESYREL) 50 MG tablet Take 50 mg by mouth at bedtime as needed for sleep.  08/21/18   [provider]    ALLERGIES:  No Known Allergies  SOCIAL HISTORY:  Social History   Tobacco Use  . Smoking status: Current Some Day Smoker    Packs/day: 0.50    Years: 46.00    Pack years: 23.00    Types: Cigarettes  . Smokeless tobacco: Never Used  . Tobacco comment: states on and off  smoker  Substance Use Topics  . Alcohol use: No    FAMILY HISTORY: Family History  Problem Relation Age of Onset  . Heart attack Father   . Heart disease Father   . Cancer Mother   . Diabetes Sister   . Diabetes Brother   . Colon cancer Neg Hx   . Esophageal cancer Neg Hx   . Rectal cancer Neg Hx   . Stomach cancer Neg Hx     EXAM: BP 119/69 (BP Location: Right Arm)   Pulse 69   Temp 97.6 F (36.4 C) (Oral)   Resp 17   Ht '5\' 8"'$  (1.727 m)   Wt 104.3 kg   SpO2 98%   BMI 34.97 kg/m  CONSTITUTIONAL: Alert and oriented and responds appropriately to questions. Well-appearing; well-nourished HEAD: Normocephalic EYES: Conjunctivae clear, pupils appear equal, EOM appear intact ENT: normal nose; moist mucous membranes NECK: Supple, normal ROM, no midline spinal tenderness or step-off or deformity, trachea midline, no thyromegaly, no carotid bruit noted, patient is tender to palpation over the right trapezius muscle with associated muscle tightness without redness, warmth, soft tissue swelling, ecchymosis CARD: RRR; S1 and S2 appreciated; no murmurs, no clicks, no rubs, no gallops RESP: Normal chest excursion without splinting or tachypnea; breath sounds clear and equal bilaterally; no wheezes, no rhonchi, no rales, no hypoxia or respiratory distress, speaking full sentences ABD/GI: Normal bowel sounds; non-distended; soft, non-tender, no rebound, no guarding, no peritoneal signs, no hepatosplenomegaly BACK:  The back appears normal EXT: Normal ROM in all joints; no deformity noted, no edema; no cyanosis SKIN: Normal color for age and race; warm; no rash on exposed skin NEURO: Moves all extremities equally, sensation to light touch intact diffusely, cranial nerves II through XII intact, normal speech, normal gait PSYCH: The patient's mood and manner are appropriate.   MEDICAL DECISION MAKING: Patient here with right neck pain.  Suspect acute torticollis for muscle spasm, strain.   Labs obtained in triage reviewed/interpreted without acute abnormality.  He has no focal neurologic deficits.  Is not complaining of headache currently.  Doubt intracranial hemorrhage, CVA, meningitis, carotid artery dissection or critical stenosis, cervical myelopathy, epidural abscess or hematoma, discitis or osteomyelitis, radiculopathy.  I do not feel he needs emergent imaging at this time.  Will treat with IM Toradol as he drove himself to the emergency department.  Creatinine is normal.  Recommend stretching, alternating heat and ice, and will discharge with anti-inflammatories and muscle relaxers.  He has a PCP for follow-up.  Patient verbalized understanding.  At this time, I do not feel there is any life-threatening condition present. I have reviewed, interpreted and discussed all results (EKG, imaging, lab, urine as appropriate) and exam findings with patient/family. I have reviewed nursing notes and appropriate  previous records.  I feel the patient is safe to be discharged home without further emergent workup and can continue workup as an outpatient as needed. Discussed usual and customary return precautions. Patient/family verbalize understanding and are comfortable with this plan.  Outpatient follow-up has been provided as needed. All questions have been answered.   Jeffey Janssen was evaluated in Emergency Department on 11/03/2019 for the symptoms described in the history of present illness. He was evaluated in the context of the global COVID-19 pandemic, which necessitated consideration that the patient might be at risk for infection with the SARS-CoV-2 virus that causes COVID-19. Institutional protocols and algorithms that pertain to the evaluation of patients at risk for COVID-19 are in a state of rapid change based on information released by regulatory bodies including the CDC and federal and state organizations. These policies and algorithms were followed during the patient's care in the  ED.      Idabelle Mcpeters, Delice Bison, DO 11/03/19 [phone]

## 2019-11-03 NOTE — Discharge Instructions (Signed)
You may alternate Tylenol 1000 mg every 6 hours as needed for pain and Ibuprofen 800 mg every 8 hours as needed for pain.  Please take Ibuprofen with food.  Do not take more than 4000 mg of Tylenol (acetaminophen) in a 24 hour period.  

## 2020-07-25 ENCOUNTER — Emergency Department (HOSPITAL_COMMUNITY)
Admission: EM | Admit: 2020-07-25 | Discharge: 2020-07-26 | Disposition: A | Discharge status: Home / Self Care | Payer: 59 | Attending: Emergency Medicine | Admitting: Emergency Medicine

## 2020-07-25 ENCOUNTER — Encounter (HOSPITAL_COMMUNITY): Payer: Self-pay | Admitting: Emergency Medicine

## 2020-07-25 ENCOUNTER — Other Ambulatory Visit: Payer: Self-pay

## 2020-07-25 DIAGNOSIS — Z20822 Contact with and (suspected) exposure to covid-19: Secondary | ICD-10-CM | POA: Insufficient documentation

## 2020-07-25 DIAGNOSIS — Z96642 Presence of left artificial hip joint: Secondary | ICD-10-CM | POA: Diagnosis not present

## 2020-07-25 DIAGNOSIS — Z79899 Other long term (current) drug therapy: Secondary | ICD-10-CM | POA: Diagnosis not present

## 2020-07-25 DIAGNOSIS — F17210 Nicotine dependence, cigarettes, uncomplicated: Secondary | ICD-10-CM | POA: Insufficient documentation

## 2020-07-25 DIAGNOSIS — E1100 Type 2 diabetes mellitus with hyperosmolarity without nonketotic hyperglycemic-hyperosmolar coma (NKHHC): Secondary | ICD-10-CM | POA: Diagnosis not present

## 2020-07-25 DIAGNOSIS — S161XXA Strain of muscle, fascia and tendon at neck level, initial encounter: Secondary | ICD-10-CM | POA: Diagnosis not present

## 2020-07-25 DIAGNOSIS — S199XXA Unspecified injury of neck, initial encounter: Secondary | ICD-10-CM | POA: Diagnosis present

## 2020-07-25 DIAGNOSIS — Z794 Long term (current) use of insulin: Secondary | ICD-10-CM | POA: Insufficient documentation

## 2020-07-25 DIAGNOSIS — Z955 Presence of coronary angioplasty implant and graft: Secondary | ICD-10-CM | POA: Insufficient documentation

## 2020-07-25 DIAGNOSIS — R519 Headache, unspecified: Secondary | ICD-10-CM | POA: Insufficient documentation

## 2020-07-25 DIAGNOSIS — Z96651 Presence of right artificial knee joint: Secondary | ICD-10-CM | POA: Diagnosis not present

## 2020-07-25 DIAGNOSIS — X58XXXA Exposure to other specified factors, initial encounter: Secondary | ICD-10-CM | POA: Diagnosis not present

## 2020-07-25 LAB — CBC WITH DIFFERENTIAL/PLATELET
Abs Immature Granulocytes: 0.04 10*3/uL (ref 0.00–0.07)
Basophils Absolute: 0.1 10*3/uL (ref 0.0–0.1)
Basophils Relative: 1 %
Eosinophils Absolute: 0.2 10*3/uL (ref 0.0–0.5)
Eosinophils Relative: 2 %
HCT: 44.7 % (ref 39.0–52.0)
Hemoglobin: 14.6 g/dL (ref 13.0–17.0)
Immature Granulocytes: 0 %
Lymphocytes Relative: 21 %
Lymphs Abs: 2 10*3/uL (ref 0.7–4.0)
MCH: 31.9 pg (ref 26.0–34.0)
MCHC: 32.7 g/dL (ref 30.0–36.0)
MCV: 97.8 fL (ref 80.0–100.0)
Monocytes Absolute: 0.6 10*3/uL (ref 0.1–1.0)
Monocytes Relative: 7 %
Neutro Abs: 6.6 10*3/uL (ref 1.7–7.7)
Neutrophils Relative %: 69 %
Platelets: 276 10*3/uL (ref 150–400)
RBC: 4.57 MIL/uL (ref 4.22–5.81)
RDW: 12.3 % (ref 11.5–15.5)
WBC: 9.6 10*3/uL (ref 4.0–10.5)
nRBC: 0 % (ref 0.0–0.2)

## 2020-07-25 LAB — COMPREHENSIVE METABOLIC PANEL
ALT: 33 U/L (ref 0–44)
AST: 35 U/L (ref 15–41)
Albumin: 4.2 g/dL (ref 3.5–5.0)
Alkaline Phosphatase: 90 U/L (ref 38–126)
Anion gap: 12 (ref 5–15)
BUN: 13 mg/dL (ref 8–23)
CO2: 26 mmol/L (ref 22–32)
Calcium: 10.1 mg/dL (ref 8.9–10.3)
Chloride: 101 mmol/L (ref 98–111)
Creatinine, Ser: 1.04 mg/dL (ref 0.61–1.24)
GFR, Estimated: >60 mL/min (ref >60)
Glucose, Bld: 142 mg/dL — ABNORMAL HIGH (ref 70–99)
Potassium: 3.6 mmol/L (ref 3.5–5.1)
Sodium: 139 mmol/L (ref 135–145)
Total Bilirubin: 0.9 mg/dL (ref 0.3–1.2)
Total Protein: 7.9 g/dL (ref 6.5–8.1)

## 2020-07-25 NOTE — ED Triage Notes (Signed)
Patient reports frontal headache with neck pain onset yesterday , denies head injury , no fever or photophobia , denies emesis or blurred vision .

## 2020-07-25 NOTE — ED Notes (Addendum)
Pt left AMA °

## 2020-07-26 ENCOUNTER — Emergency Department (HOSPITAL_COMMUNITY): Payer: 59

## 2020-07-26 LAB — SARS CORONAVIRUS 2 (TAT 6-24 HRS): SARS Coronavirus 2: NEGATIVE

## 2020-07-26 MED ORDER — OXYCODONE-ACETAMINOPHEN 5-325 MG PO TABS
1.0000 | ORAL_TABLET | ORAL | Status: DC | PRN
  Administered 2020-07-26: 1 via ORAL
  Filled 2020-07-26: qty 1

## 2020-07-26 MED ORDER — KETOROLAC TROMETHAMINE 60 MG/2ML IM SOLN
30.0000 mg | Freq: Once | INTRAMUSCULAR | Status: AC
  Administered 2020-07-26: 30 mg via INTRAMUSCULAR
  Filled 2020-07-26: qty 2

## 2020-07-26 MED ORDER — DIAZEPAM 5 MG PO TABS: 2.5000 mg | ORAL_TABLET | Freq: Three times a day (TID) | ORAL | 0 refills | Status: AC | PRN

## 2020-07-26 MED ORDER — DIAZEPAM 2 MG PO TABS
2.0000 mg | ORAL_TABLET | Freq: Once | ORAL | Status: AC
  Administered 2020-07-26: 2 mg via ORAL
  Filled 2020-07-26: qty 1

## 2020-07-26 NOTE — ED Notes (Signed)
Pt left and went home said he was leaving. Pt came back to be seen after being pulled off the floor.

## 2020-07-26 NOTE — ED Provider Notes (Signed)
[facility] EMERGENCY DEPARTMENT Provider Note   CSN: [id number] Arrival date & time: 07/25/20  2004     History Chief Complaint  Patient presents with  . Headache  . Neck Pain    Peter Manning is a 65 y.o. male.   Headache Pain location:  Occipital Quality:  Sharp and dull Onset quality:  Gradual Duration:  3 days Timing:  Constant Progression:  Worsening Chronicity:  Recurrent Similar to prior headaches: yes   Context: not activity, not defecating and not eating   Associated symptoms: neck pain   Neck Pain Associated symptoms: headaches        Past Medical History:  Diagnosis Date  . Bronchitis   . Depression   . Hepatitis 1976   pt. doesn't remember if A, B, C?  . History of kidney stones   . Hyperlipidemia   . Hypertension   . OSA (obstructive sleep apnea) 12/09/2011   PSG 01/03/12>>AHI 45.7, SpO2 low 73%, PLMI 0.  CPAP 15 cm H2O>>AHI 0, +R.   Marland Kitchen Pneumonia 2015  . Renal disorder    had kidney stone  . Sleep apnea    no CPAP ordered per patient/over 3 years    Patient Active Problem List   Diagnosis Date Noted  . Hyperosmolar hyperglycemic coma due to diabetes mellitus without ketoacidosis ([place]) 08/26/2018  . AKI (acute kidney injury) ([place]) 08/26/2018  . Hyperkalemia 08/26/2018  . Spinal stenosis of cervical region 04/20/2018  . Foraminal stenosis of cervical region 04/20/2018  . Chronic left shoulder pain 01/08/2018  . Lumbar foraminal stenosis 01/08/2018  . S/P lumbar fusion 05/03/2017  . Impingement syndrome of left shoulder 05/03/2017  . S/P total knee arthroplasty, right 12/28/2016  . Right knee DJD 10/10/2016  . 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 OSA (obstructive sleep apnea) 12/09/2011  . Chronic bronchitis ([place]) 12/09/2011  . Tobacco abuse 12/09/2011    Past Surgical History:  Procedure Laterality Date  . BACK SURGERY  2001   put in screws per pt.  . COLONOSCOPY    . CYSTOSCOPY WITH RETROGRADE PYELOGRAM,  URETEROSCOPY AND STENT PLACEMENT Left 07/06/2017   Procedure: CYSTOSCOPY WITH LEFT RETROGRADE PYELOGRAM, URETEROSCOPY WITH HOLMIUM LASER BASKET EXTRACTION AND STENT PLACEMENT;  Surgeon: [name], MD;  Location: [facility];  Service: Urology;  Laterality: Left;  . JOINT REPLACEMENT    . REVISION TOTAL HIP ARTHROPLASTY Left 11/2015  . TOTAL HIP ARTHROPLASTY     left hip  . TOTAL HIP REVISION Left 11/18/2015   Procedure: LEFT TOTAL HIP REVISION;  Surgeon: Marybelle Killings, MD;  Location: [place];  Service: Orthopedics;  Laterality: Left;  . TOTAL KNEE ARTHROPLASTY Right 10/10/2016   Procedure: TOTAL KNEE ARTHROPLASTY;  Surgeon: Marybelle Killings, MD;  Location: [place];  Service: Orthopedics;  Laterality: Right;       Family History  Problem Relation Age of Onset  . Heart attack Father   . Heart disease Father   . Cancer Mother   . Diabetes Sister   . Diabetes Brother   . Colon cancer Neg Hx   . Esophageal cancer Neg Hx   . Rectal cancer Neg Hx   . Stomach cancer Neg Hx     Social History   Tobacco Use  . Smoking status: Current Some Day Smoker    Packs/day: 0.50    Years: 46.00    Pack years: 23.00    Types: Cigarettes  . Smokeless tobacco: Never Used  .  Tobacco comment: states on and off smoker  Vaping Use  . Vaping Use: Never used  Substance Use Topics  . Alcohol use: No  . Drug use: No    Home Medications Prior to Admission medications   Medication Sig Start Date End Date Taking? Authorizing Provider  albuterol (PROVENTIL HFA;VENTOLIN HFA) 108 (90 Base) MCG/ACT inhaler Inhale 2 puffs into the lungs every 4 (four) hours as needed for wheezing or shortness of breath. 07/19/17  Yes Lacretia Leigh, MD  amLODipine (NORVASC) 10 MG tablet Take 10 mg by mouth daily.  08/24/16  Yes [provider]  atorvastatin (LIPITOR) 40 MG tablet Take 40 mg by mouth at bedtime. 10/10/19  Yes [provider]  blood glucose meter kit and supplies KIT Dispense based on patient  and insurance preference. Use up to four times daily as directed. (FOR ICD-9 250.00, 250.01). 08/29/18  Yes Charlynne Cousins, MD  diazepam (VALIUM) 5 MG tablet Take 0.5 tablets (2.5 mg total) by mouth every 8 (eight) hours as needed for anxiety (spasms). 07/26/20  Yes Keyvin Rison, Corene Cornea, MD  etodolac (LODINE XL) 400 MG 24 hr tablet Take 400 mg by mouth daily. 07/16/20  Yes [provider]  gabapentin (NEURONTIN) 800 MG tablet Take 800 mg by mouth 3 (three) times daily. 10/11/19  Yes [provider]  hydrOXYzine (ATARAX/VISTARIL) 50 MG tablet Take 50 mg by mouth every 8 (eight) hours as needed for anxiety.  07/10/18  Yes [provider]  ibuprofen (ADVIL) 800 MG tablet Take 1 tablet (800 mg total) by mouth every 8 (eight) hours as needed for mild pain. 11/03/19  Yes Ward, Cyril Mourning N, DO  insulin aspart (NOVOLOG PENFILL) cartridge Inject 3 Units into the skin 3 (three) times daily with meals. 08/29/18  Yes Charlynne Cousins, MD  Insulin Detemir (LEVEMIR) 100 UNIT/ML Pen Inject 30 Units into the skin daily. 08/29/18  Yes Charlynne Cousins, MD  Insulin Pen Needle 31G X 6 MM MISC 1 Device by Does not apply route 2 (two) times daily. 08/29/18  Yes Charlynne Cousins, MD  metFORMIN (GLUCOPHAGE) 500 MG tablet Take 1 tablet (500 mg total) by mouth 2 (two) times daily with a meal. 08/29/18 11/02/28 Yes Charlynne Cousins, MD  methocarbamol (ROBAXIN) 500 MG tablet Take 1 tablet (500 mg total) by mouth every 8 (eight) hours as needed for muscle spasms. 11/03/19  Yes Ward, Delice Bison, DO  oxyCODONE-acetaminophen (PERCOCET) 10-325 MG tablet Take 1 tablet by mouth 4 (four) times daily.   Yes [provider]  SYMBICORT 160-4.5 MCG/ACT inhaler Inhale 2 puffs into the lungs daily as needed (shortness of breath).  08/05/18  Yes [provider]  traZODone (DESYREL) 50 MG tablet Take 50 mg by mouth at bedtime as needed for sleep.  08/21/18  Yes [provider]  Vitamin D,  Ergocalciferol, (DRISDOL) 1.25 MG (50000 UNIT) CAPS capsule Take 50,000 Units by mouth every Monday. 10/07/19  Yes [provider]    Allergies    Patient has no known allergies.  Review of Systems   Review of Systems  Musculoskeletal: Positive for neck pain.  Neurological: Positive for headaches.  All other systems reviewed and are negative.   Physical Exam Updated Vital Signs BP (!) 152/6 (BP Location: Left Arm)   Pulse 66   Temp 98.2 F (36.8 C) (Oral)   Resp 18   Ht $R'5\' 8"'FF$  (1.727 m)   Wt 122 kg   SpO2 99%   BMI 40.90 kg/m  Physical Exam Vitals and nursing note reviewed.  Constitutional:      Appearance: He is well-developed and well-nourished.  HENT:     Head: Normocephalic and atraumatic.     Mouth/Throat:     Mouth: Mucous membranes are moist.     Pharynx: Oropharynx is clear.  Eyes:     Pupils: Pupils are equal, round, and reactive to light.  Cardiovascular:     Rate and Rhythm: Normal rate.  Pulmonary:     Effort: Pulmonary effort is normal. No respiratory distress.  Abdominal:     General: Abdomen is flat. There is no distension.  Musculoskeletal:        General: Normal range of motion.     Cervical back: Normal range of motion.  Skin:    General: Skin is warm and dry.  Neurological:     General: No focal deficit present.     Mental Status: He is alert.     ED Results / Procedures / Treatments   Labs (all labs ordered are listed, but only abnormal results are displayed) Labs Reviewed  COMPREHENSIVE METABOLIC PANEL - Abnormal; Notable for the following components:      Result Value   Glucose, Bld 142 (*)    All other components within normal limits  SARS CORONAVIRUS 2 (TAT 6-24 HRS)  CBC WITH DIFFERENTIAL/PLATELET    EKG None  Radiology DG Chest Portable 1 View  Result Date: 07/26/2020 CLINICAL DATA:  Stroke EXAM: PORTABLE CHEST 1 VIEW COMPARISON:  08/27/2018 FINDINGS: The heart size and mediastinal contours are within normal  limits. Both lungs are clear. The visualized skeletal structures are unremarkable. IMPRESSION: No active disease. Electronically Signed   By: Fidela Salisbury MD   On: 07/26/2020 02:58    Procedures Procedures (including critical care time)  Medications Ordered in ED Medications  ketorolac (TORADOL) injection 30 mg (30 mg Intramuscular Given 07/26/20 0251)  diazepam (VALIUM) tablet 2 mg (2 mg Oral Given 07/26/20 0251)    ED Course  I have reviewed the triage vital signs and the nursing notes.  Pertinent labs & imaging results that were available during my care of the patient were reviewed by me and considered in my medical decision making (see chart for details).    MDM Rules/Calculators/A&P                          Here with bilateral muscular neck pain.  Worse with palpation and certain movements.  Totally relieved with medications in emergency room.  Low suspicion for meningitis, carotid or vertebral artery dissection, subarachnoid hemorrhage.  No evidence of lemierres. Final Clinical Impression(s) / ED Diagnoses Final diagnoses:  Acute strain of neck muscle, initial encounter    Rx / DC Orders ED Discharge Orders         Ordered    diazepam (VALIUM) 5 MG tablet  Every 8 hours PRN        07/26/20 0521           Clemma Johnsen, Corene Cornea, MD 07/27/20 [phone]

## 2020-07-28 ENCOUNTER — Other Ambulatory Visit: Payer: Self-pay

## 2020-07-28 ENCOUNTER — Emergency Department (HOSPITAL_COMMUNITY): Payer: 59

## 2020-07-28 ENCOUNTER — Encounter (HOSPITAL_COMMUNITY): Payer: Self-pay | Admitting: Emergency Medicine

## 2020-07-28 ENCOUNTER — Emergency Department (HOSPITAL_COMMUNITY)
Admission: EM | Admit: 2020-07-28 | Discharge: 2020-07-28 | Disposition: A | Discharge status: Home / Self Care | Payer: 59 | Attending: Emergency Medicine | Admitting: Emergency Medicine

## 2020-07-28 DIAGNOSIS — F17210 Nicotine dependence, cigarettes, uncomplicated: Secondary | ICD-10-CM | POA: Diagnosis not present

## 2020-07-28 DIAGNOSIS — Z794 Long term (current) use of insulin: Secondary | ICD-10-CM | POA: Insufficient documentation

## 2020-07-28 DIAGNOSIS — Z96651 Presence of right artificial knee joint: Secondary | ICD-10-CM | POA: Diagnosis not present

## 2020-07-28 DIAGNOSIS — R519 Headache, unspecified: Secondary | ICD-10-CM | POA: Insufficient documentation

## 2020-07-28 DIAGNOSIS — E119 Type 2 diabetes mellitus without complications: Secondary | ICD-10-CM | POA: Diagnosis not present

## 2020-07-28 DIAGNOSIS — Z96642 Presence of left artificial hip joint: Secondary | ICD-10-CM | POA: Insufficient documentation

## 2020-07-28 DIAGNOSIS — M542 Cervicalgia: Secondary | ICD-10-CM | POA: Diagnosis present

## 2020-07-28 DIAGNOSIS — M62838 Other muscle spasm: Secondary | ICD-10-CM | POA: Diagnosis not present

## 2020-07-28 DIAGNOSIS — I10 Essential (primary) hypertension: Secondary | ICD-10-CM | POA: Diagnosis not present

## 2020-07-28 DIAGNOSIS — Z79899 Other long term (current) drug therapy: Secondary | ICD-10-CM | POA: Diagnosis not present

## 2020-07-28 DIAGNOSIS — Z7984 Long term (current) use of oral hypoglycemic drugs: Secondary | ICD-10-CM | POA: Diagnosis not present

## 2020-07-28 LAB — CBC WITH DIFFERENTIAL/PLATELET
Abs Immature Granulocytes: 0.04 10*3/uL (ref 0.00–0.07)
Basophils Absolute: 0.1 10*3/uL (ref 0.0–0.1)
Basophils Relative: 1 %
Eosinophils Absolute: 0.1 10*3/uL (ref 0.0–0.5)
Eosinophils Relative: 1 %
HCT: 43.4 % (ref 39.0–52.0)
Hemoglobin: 14.1 g/dL (ref 13.0–17.0)
Immature Granulocytes: 0 %
Lymphocytes Relative: 21 %
Lymphs Abs: 2.1 10*3/uL (ref 0.7–4.0)
MCH: 31.8 pg (ref 26.0–34.0)
MCHC: 32.5 g/dL (ref 30.0–36.0)
MCV: 98 fL (ref 80.0–100.0)
Monocytes Absolute: 0.7 10*3/uL (ref 0.1–1.0)
Monocytes Relative: 7 %
Neutro Abs: 6.9 10*3/uL (ref 1.7–7.7)
Neutrophils Relative %: 70 %
Platelets: 269 10*3/uL (ref 150–400)
RBC: 4.43 MIL/uL (ref 4.22–5.81)
RDW: 12.4 % (ref 11.5–15.5)
WBC: 9.8 10*3/uL (ref 4.0–10.5)
nRBC: 0 % (ref 0.0–0.2)

## 2020-07-28 LAB — BASIC METABOLIC PANEL
Anion gap: 12 (ref 5–15)
BUN: 14 mg/dL (ref 8–23)
CO2: 25 mmol/L (ref 22–32)
Calcium: 9.9 mg/dL (ref 8.9–10.3)
Chloride: 97 mmol/L — ABNORMAL LOW (ref 98–111)
Creatinine, Ser: 1 mg/dL (ref 0.61–1.24)
GFR, Estimated: >60 mL/min (ref >60)
Glucose, Bld: 122 mg/dL — ABNORMAL HIGH (ref 70–99)
Potassium: 3.4 mmol/L — ABNORMAL LOW (ref 3.5–5.1)
Sodium: 134 mmol/L — ABNORMAL LOW (ref 135–145)

## 2020-07-28 MED ORDER — DICLOFENAC SODIUM 1 % EX GEL: 2.0000 g | Freq: Four times a day (QID) | CUTANEOUS | 1 refills | Status: DC

## 2020-07-28 MED ORDER — METHOCARBAMOL 500 MG PO TABS: 500.0000 mg | ORAL_TABLET | Freq: Two times a day (BID) | ORAL | 0 refills | Status: DC

## 2020-07-28 NOTE — ED Triage Notes (Signed)
Patient with lower occipital head pain, going into his neck.  Patient was seen last night for same, did have relief while he was here last night.  Patient took oxycodone at midnight, does not have any relief of headache.  No injury, no fever, no photophobia.

## 2020-07-28 NOTE — Discharge Instructions (Signed)
If you are taking etodolac then do not take any additional NSAIDS (ibuprofen/aleve/etc)  You have been diagnosed with a muscle strain today.  The spasm in your neck muscles is causing pain.  Please used topical medication such as Voltaren gel and IcyHot.  You may alternate between these 2 medicines.  You may also use Tylenol 1000 mg every 6 hours for pain.  I also prescribed a muscle relaxer which may make you drowsy.  Please use as prescribed.  Do warm compresses and gentle stretching please follow-up with your orthopedic doctor.  You may benefit from physical therapy

## 2020-07-28 NOTE — ED Provider Notes (Signed)
[facility] EMERGENCY DEPARTMENT Provider Note   CSN: [id number] Arrival date & time: 07/28/20  0145     History Chief Complaint  Patient presents with  . Headache    Peter Manning is a 65 y.o. male.  HPI Patient is a 65 year old male with a past medical history significant for chronic recurrent muscle aches and neck pain.  He states that for the past 1 week he has had intermittent achy neck pain.  He states that he was seen in the ER 3 days ago was given medication which seemed to help for a while.  He states that usually his symptoms only go away for a short while.  He states that the benzodiazepine medicine that he was given helped him relax.  He states that the quality of the pain is sharp when he tries to move his head.  He states that it is not significantly changed recently apart from recurring within 12 hours of him taking the Valium.  He states he has an associated occipital headache.  Denies any lightheadedness or dizziness.  No visual symptoms.  No weakness or numbness.  No other associate symptoms.  He is taken no other medications prior to arrival besides a Percocet.  He denies any fever or photophobia.    Past Medical History:  Diagnosis Date  . Bronchitis   . Depression   . Hepatitis 1976   pt. doesn't remember if A, B, C?  . History of kidney stones   . Hyperlipidemia   . Hypertension   . OSA (obstructive sleep apnea) 12/09/2011   PSG 01/03/12>>AHI 45.7, SpO2 low 73%, PLMI 0.  CPAP 15 cm H2O>>AHI 0, +R.   Marland Kitchen Pneumonia 2015  . Renal disorder    had kidney stone  . Sleep apnea    no CPAP ordered per patient/over 3 years    Patient Active Problem List   Diagnosis Date Noted  . Hyperosmolar hyperglycemic coma due to diabetes mellitus without ketoacidosis ([place]) 08/26/2018  . AKI (acute kidney injury) ([place]) 08/26/2018  . Hyperkalemia 08/26/2018  . Spinal stenosis of cervical region 04/20/2018  . Foraminal stenosis of cervical region 04/20/2018   . Chronic left shoulder pain 01/08/2018  . Lumbar foraminal stenosis 01/08/2018  . S/P lumbar fusion 05/03/2017  . Impingement syndrome of left shoulder 05/03/2017  . S/P total knee arthroplasty, right 12/28/2016  . Right knee DJD 10/10/2016  . 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 OSA (obstructive sleep apnea) 12/09/2011  . Chronic bronchitis ([place]) 12/09/2011  . Tobacco abuse 12/09/2011    Past Surgical History:  Procedure Laterality Date  . BACK SURGERY  2001   put in screws per pt.  . COLONOSCOPY    . CYSTOSCOPY WITH RETROGRADE PYELOGRAM, URETEROSCOPY AND STENT PLACEMENT Left 07/06/2017   Procedure: CYSTOSCOPY WITH LEFT RETROGRADE PYELOGRAM, URETEROSCOPY WITH HOLMIUM LASER BASKET EXTRACTION AND STENT PLACEMENT;  Surgeon: [name], MD;  Location: [facility];  Service: Urology;  Laterality: Left;  . JOINT REPLACEMENT    . REVISION TOTAL HIP ARTHROPLASTY Left 11/2015  . TOTAL HIP ARTHROPLASTY     left hip  . TOTAL HIP REVISION Left 11/18/2015   Procedure: LEFT TOTAL HIP REVISION;  Surgeon: Marybelle Killings, MD;  Location: [facility];  Service: Orthopedics;  Laterality: Left;  . TOTAL KNEE ARTHROPLASTY Right 10/10/2016   Procedure: TOTAL KNEE ARTHROPLASTY;  Surgeon: Marybelle Killings, MD;  Location: [facility];  Service: Orthopedics;  Laterality: Right;  Family History  Problem Relation Age of Onset  . Heart attack Father   . Heart disease Father   . Cancer Mother   . Diabetes Sister   . Diabetes Brother   . Colon cancer Neg Hx   . Esophageal cancer Neg Hx   . Rectal cancer Neg Hx   . Stomach cancer Neg Hx     Social History   Tobacco Use  . Smoking status: Current Some Day Smoker    Packs/day: 0.50    Years: 46.00    Pack years: 23.00    Types: Cigarettes  . Smokeless tobacco: Never Used  . Tobacco comment: states on and off smoker  Vaping Use  . Vaping Use: Never used  Substance Use Topics  . Alcohol use: No  . Drug use: No    Home Medications Prior  to Admission medications   Medication Sig Start Date End Date Taking? Authorizing Provider  diclofenac Sodium (VOLTAREN) 1 % GEL Apply 2 g topically 4 (four) times daily. 07/28/20  Yes Yesena Reaves S, PA  methocarbamol (ROBAXIN) 500 MG tablet Take 1 tablet (500 mg total) by mouth 2 (two) times daily. 07/28/20  Yes Adine Heimann S, PA  albuterol (PROVENTIL HFA;VENTOLIN HFA) 108 (90 Base) MCG/ACT inhaler Inhale 2 puffs into the lungs every 4 (four) hours as needed for wheezing or shortness of breath. 07/19/17   Lacretia Leigh, MD  amLODipine (NORVASC) 10 MG tablet Take 10 mg by mouth daily.  08/24/16   [provider]  atorvastatin (LIPITOR) 40 MG tablet Take 40 mg by mouth at bedtime. 10/10/19   [provider]  blood glucose meter kit and supplies KIT Dispense based on patient and insurance preference. Use up to four times daily as directed. (FOR ICD-9 250.00, 250.01). 08/29/18   Charlynne Cousins, MD  diazepam (VALIUM) 5 MG tablet Take 0.5 tablets (2.5 mg total) by mouth every 8 (eight) hours as needed for anxiety (spasms). 07/26/20   Mesner, Corene Cornea, MD  etodolac (LODINE XL) 400 MG 24 hr tablet Take 400 mg by mouth daily. 07/16/20   [provider]  gabapentin (NEURONTIN) 800 MG tablet Take 800 mg by mouth 3 (three) times daily. 10/11/19   [provider]  hydrOXYzine (ATARAX/VISTARIL) 50 MG tablet Take 50 mg by mouth every 8 (eight) hours as needed for anxiety.  07/10/18   [provider]  ibuprofen (ADVIL) 800 MG tablet Take 1 tablet (800 mg total) by mouth every 8 (eight) hours as needed for mild pain. 11/03/19   Ward, Delice Bison, DO  insulin aspart (NOVOLOG PENFILL) cartridge Inject 3 Units into the skin 3 (three) times daily with meals. 08/29/18   Charlynne Cousins, MD  Insulin Detemir (LEVEMIR) 100 UNIT/ML Pen Inject 30 Units into the skin daily. 08/29/18   Charlynne Cousins, MD  Insulin Pen Needle 31G X 6 MM MISC 1 Device by Does not apply route 2 (two)  times daily. 08/29/18   Charlynne Cousins, MD  metFORMIN (GLUCOPHAGE) 500 MG tablet Take 1 tablet (500 mg total) by mouth 2 (two) times daily with a meal. 08/29/18 11/02/28  Charlynne Cousins, MD  oxyCODONE-acetaminophen (PERCOCET) 10-325 MG tablet Take 1 tablet by mouth 4 (four) times daily.    [provider]  SYMBICORT 160-4.5 MCG/ACT inhaler Inhale 2 puffs into the lungs daily as needed (shortness of breath).  08/05/18   [provider]  traZODone (DESYREL) 50 MG tablet Take 50 mg by mouth at bedtime  as needed for sleep.  08/21/18   [provider]  Vitamin D, Ergocalciferol, (DRISDOL) 1.25 MG (50000 UNIT) CAPS capsule Take 50,000 Units by mouth every Monday. 10/07/19   [provider]    Allergies    Patient has no known allergies.  Review of Systems   Review of Systems  Constitutional: Negative for chills and fever.  HENT: Negative for congestion.   Respiratory: Negative for shortness of breath.   Cardiovascular: Negative for chest pain.  Gastrointestinal: Negative for abdominal pain.  Musculoskeletal: Negative for neck pain.       Neck pain  Neurological: Positive for headaches.    Physical Exam Updated Vital Signs BP (!) 155/84   Pulse 65   Temp 98 F (36.7 C)   Resp 15   SpO2 96%   Physical Exam Vitals and nursing note reviewed.  Constitutional:      General: He is not in acute distress.    Appearance: Normal appearance. He is not ill-appearing.  HENT:     Head: Normocephalic and atraumatic.  Eyes:     General: No scleral icterus.       Right eye: No discharge.        Left eye: No discharge.     Conjunctiva/sclera: Conjunctivae normal.  Pulmonary:     Effort: Pulmonary effort is normal.     Breath sounds: No stridor.  Musculoskeletal:     Comments: Severe spasm right trapezius muscle with tenderness to palpation of multiple trigger points.  Full range of motion of shoulder.  No tenderness to palpation of the deltoid or right  arm.  There is no midline tenderness to palpation of the C-spine  Skin:    General: Skin is warm and dry.     Capillary Refill: Capillary refill takes less than 2 seconds.  Neurological:     Mental Status: He is alert and oriented to person, place, and time. Mental status is at baseline.     Comments: Alert and oriented to self, place, time and event.   Speech is fluent, clear without dysarthria or dysphasia.   Strength 5/5 in upper/lower extremities  Sensation intact in upper/lower extremities    CN I not tested  CN II grossly intact visual fields bilaterally. Did not visualize posterior eye.   CN III, IV, VI PERRLA and EOMs intact bilaterally  CN V Intact sensation to sharp and light touch to the face  CN VII facial movements symmetric  CN VIII not tested  CN IX, X no uvula deviation, symmetric rise of soft palate  CN XI 5/5 SCM and trapezius strength bilaterally  CN XII Midline tongue protrusion, symmetric L/R movements      ED Results / Procedures / Treatments   Labs (all labs ordered are listed, but only abnormal results are displayed) Labs Reviewed  BASIC METABOLIC PANEL - Abnormal; Notable for the following components:      Result Value   Sodium 134 (*)    Potassium 3.4 (*)    Chloride 97 (*)    Glucose, Bld 122 (*)    All other components within normal limits  CBC WITH DIFFERENTIAL/PLATELET    EKG None  Radiology CT Head Wo Contrast  Result Date: 07/28/2020 CLINICAL DATA:  Lower acceptable headache extending into the neck. EXAM: CT HEAD WITHOUT CONTRAST TECHNIQUE: Contiguous axial images were obtained from the base of the skull through the vertex without intravenous contrast. COMPARISON:  06/09/2018 FINDINGS: Brain: No evidence of acute infarction, hemorrhage, hydrocephalus, extra-axial  collection or mass lesion/mass effect. Vascular: No hyperdense vessel or unexpected calcification. Skull: Normal. Negative for fracture or focal lesion. Sinuses/Orbits: No acute  finding. Other: None. IMPRESSION: No acute intracranial abnormalities. Electronically Signed   By: Lucienne Capers M.D.   On: 07/28/2020 02:22    Procedures Procedures   Medications Ordered in ED Medications - No data to display  ED Course  I have reviewed the triage vital signs and the nursing notes.  Pertinent labs & imaging results that were available during my care of the patient were reviewed by me and considered in my medical decision making (see chart for details).    MDM Rules/Calculators/A&P                          Patient is 65 year old male with history of recurrent spasms and neck pain.  He is on medications for the symptoms however he states that he is having significant neck pain today that seems to be radiating to the back of his head and causing a headache.  He has reproducible muscular tenderness of the neck and is neurologically intact.  Patient was seen recently for same symptoms.  His symptoms improved after benzodiazepine p.o. medication was given.  As it seems to be a recurrent issue I am opting to not prescribe patient benzodiazepine or give him any here in the ER but rather encourage Tylenol, muscle relaxer and massage and follow-up with his orthopedic doctor.  He has no other significant complaints at this time.  Return precautions given.  He is understanding of plan and agreeable to discharge at this time.  Also encouraged topical Voltaren gel and BenGay.  Final Clinical Impression(s) / ED Diagnoses Final diagnoses:  Muscle spasm  Neck pain    Rx / DC Orders ED Discharge Orders         Ordered    methocarbamol (ROBAXIN) 500 MG tablet  2 times daily        07/28/20 0907    diclofenac Sodium (VOLTAREN) 1 % GEL  4 times daily        07/28/20 0907           Tedd Sias, PA 07/28/20 1055    Lajean Saver, MD 07/28/20 1338

## 2020-07-28 NOTE — ED Notes (Signed)
Patient verbalized understanding of dc instructions, vss, ambulatory with nad.   

## 2020-09-02 ENCOUNTER — Ambulatory Visit: Payer: Self-pay

## 2020-09-02 ENCOUNTER — Ambulatory Visit (INDEPENDENT_AMBULATORY_CARE_PROVIDER_SITE_OTHER): Payer: 59 | Admitting: Surgery

## 2020-09-02 ENCOUNTER — Encounter: Payer: Self-pay | Admitting: Surgery

## 2020-09-02 ENCOUNTER — Other Ambulatory Visit: Payer: Self-pay

## 2020-09-02 DIAGNOSIS — R519 Headache, unspecified: Secondary | ICD-10-CM

## 2020-09-02 DIAGNOSIS — M4722 Other spondylosis with radiculopathy, cervical region: Secondary | ICD-10-CM

## 2020-09-02 DIAGNOSIS — M542 Cervicalgia: Secondary | ICD-10-CM

## 2020-09-02 NOTE — Progress Notes (Signed)
Office Visit Note   Patient: Peter Manning           Date of Birth: [birth year]           MRN: [id number] Visit Date: 09/02/2020              Requested by: Sonia Side., FNP [place],  [place] [id number] PCP: Sonia Side., FNP   Assessment & Plan: Visit Diagnoses:  1. Cervicalgia   2. Other spondylosis with radiculopathy, cervical region   3. Occipital headache     Plan: With patient's worsening neck pain, occipital headaches and failed conservative treatment after 2 emergency department visits I recommend getting a cervical spine MRI and comparing to the study that was done 04/10/2018. We will have patient follow-up with Dr. Lorin Mercy after completion to discuss results and further treatment options. All questions answered.  Follow-Up Instructions: Return in about 2 weeks (around 09/16/2020) for review cervical mri scan.   Orders:  Orders Placed This Encounter  Procedures  . XR Cervical Spine 2 or 3 views   No orders of the defined types were placed in this encounter.     Procedures: No procedures performed   Clinical Data: No additional findings.   Subjective: Chief Complaint  Patient presents with  . Neck - Pain    HPI 65 year old black male comes in today with complaints of worsening neck pain and occipital headaches. Patient has known history of cervical spondylosis and was last seen by Dr. Lorin Mercy 04/20/2018. At that visit he had discussed surgical intervention with C5-6 and C6-7 ACDF but patient states that he did not want to have another surgery done. He has continued to have ongoing problems with his neck. He presented to the emergency department with complaints of neck pain 11/03/2019, 07/25/2020 and then again 07/28/2020. States that he has not had any improvement. Pain radiates from the posterior neck up into the occipital region. Pain also aggravated with rotating his neck where he is losing motion. Continues to have ongoing chronic numbness and  tingling in his hand.  Review of Systems No current cardiac pulmonary GI GU issues  Objective: Vital Signs: There were no vitals taken for this visit.  Physical Exam HENT:     Head: Normocephalic and atraumatic.  Eyes:     Extraocular Movements: Extraocular movements intact.  Neck:     Comments: He does have limited range of motion with rotation to the left and right with discomfort. Some posterior neck tenderness palpation open to the occipital region. Pulmonary:     Effort: No respiratory distress.  Musculoskeletal:     Comments: Bilateral shoulders unremarkable. No focal motor deficits.  Neurological:     General: No focal deficit present.     Mental Status: He is alert.     Ortho Exam  Specialty Comments:  No specialty comments available.  Imaging: No results found.   PMFS History: Patient Active Problem List   Diagnosis Date Noted  . Hyperosmolar hyperglycemic coma due to diabetes mellitus without ketoacidosis ([place]) 08/26/2018  . AKI (acute kidney injury) ([place]) 08/26/2018  . Hyperkalemia 08/26/2018  . Spinal stenosis of cervical region 04/20/2018  . Foraminal stenosis of cervical region 04/20/2018  . Chronic left shoulder pain 01/08/2018  . Lumbar foraminal stenosis 01/08/2018  . S/P lumbar fusion 05/03/2017  . Impingement syndrome of left shoulder 05/03/2017  . S/P total knee arthroplasty, right 12/28/2016  . Right knee DJD 10/10/2016  . S/P revision of total  hip 11/18/2015  . OSA (obstructive sleep apnea) 12/09/2011  . Chronic bronchitis ([place]) 12/09/2011  . Tobacco abuse 12/09/2011   Past Medical History:  Diagnosis Date  . Bronchitis   . Depression   . Hepatitis 1976   pt. doesn't remember if A, B, C?  . History of kidney stones   . Hyperlipidemia   . Hypertension   . OSA (obstructive sleep apnea) 12/09/2011   PSG 01/03/12>>AHI 45.7, SpO2 low 73%, PLMI 0.  CPAP 15 cm H2O>>AHI 0, +R.   Marland Kitchen Pneumonia 2015  . Renal disorder    had kidney stone  .  Sleep apnea    no CPAP ordered per patient/over 3 years    Family History  Problem Relation Age of Onset  . Heart attack Father   . Heart disease Father   . Cancer Mother   . Diabetes Sister   . Diabetes Brother   . Colon cancer Neg Hx   . Esophageal cancer Neg Hx   . Rectal cancer Neg Hx   . Stomach cancer Neg Hx     Past Surgical History:  Procedure Laterality Date  . BACK SURGERY  2001   put in screws per pt.  . COLONOSCOPY    . CYSTOSCOPY WITH RETROGRADE PYELOGRAM, URETEROSCOPY AND STENT PLACEMENT Left 07/06/2017   Procedure: CYSTOSCOPY WITH LEFT RETROGRADE PYELOGRAM, URETEROSCOPY WITH HOLMIUM LASER BASKET EXTRACTION AND STENT PLACEMENT;  Surgeon: [name], MD;  Location: [facility];  Service: Urology;  Laterality: Left;  . JOINT REPLACEMENT    . REVISION TOTAL HIP ARTHROPLASTY Left 11/2015  . TOTAL HIP ARTHROPLASTY     left hip  . TOTAL HIP REVISION Left 11/18/2015   Procedure: LEFT TOTAL HIP REVISION;  Surgeon: Marybelle Killings, MD;  Location: [facility];  Service: Orthopedics;  Laterality: Left;  . TOTAL KNEE ARTHROPLASTY Right 10/10/2016   Procedure: TOTAL KNEE ARTHROPLASTY;  Surgeon: Marybelle Killings, MD;  Location: [facility];  Service: Orthopedics;  Laterality: Right;   Social History   Occupational History  . Not on file  Tobacco Use  . Smoking status: Current Some Day Smoker    Packs/day: 0.50    Years: 46.00    Pack years: 23.00    Types: Cigarettes  . Smokeless tobacco: Never Used  . Tobacco comment: states on and off smoker  Vaping Use  . Vaping Use: Never used  Substance and Sexual Activity  . Alcohol use: No  . Drug use: No  . Sexual activity: Not on file

## 2020-09-17 ENCOUNTER — Other Ambulatory Visit: Payer: 59

## 2020-09-18 ENCOUNTER — Other Ambulatory Visit: Payer: 59

## 2020-09-22 ENCOUNTER — Ambulatory Visit: Payer: 59 | Admitting: Orthopaedic Surgery

## 2020-09-23 ENCOUNTER — Encounter: Payer: Self-pay | Admitting: Physical Medicine and Rehabilitation

## 2020-09-25 ENCOUNTER — Ambulatory Visit
Admission: RE | Admit: 2020-09-25 | Discharge: 2020-09-25 | Disposition: A | Discharge status: Home / Self Care | Payer: 59 | Source: Ambulatory Visit | Attending: Surgery | Admitting: Surgery

## 2020-09-25 ENCOUNTER — Other Ambulatory Visit: Payer: Self-pay

## 2020-09-25 DIAGNOSIS — M4722 Other spondylosis with radiculopathy, cervical region: Secondary | ICD-10-CM

## 2020-09-25 DIAGNOSIS — R519 Headache, unspecified: Secondary | ICD-10-CM

## 2020-09-25 DIAGNOSIS — M542 Cervicalgia: Secondary | ICD-10-CM

## 2020-10-01 ENCOUNTER — Other Ambulatory Visit: Payer: 59

## 2020-10-06 ENCOUNTER — Encounter: Payer: Self-pay | Admitting: Orthopaedic Surgery

## 2020-10-06 ENCOUNTER — Ambulatory Visit (INDEPENDENT_AMBULATORY_CARE_PROVIDER_SITE_OTHER): Payer: 59 | Admitting: Orthopaedic Surgery

## 2020-10-06 DIAGNOSIS — M4802 Spinal stenosis, cervical region: Secondary | ICD-10-CM | POA: Diagnosis not present

## 2020-10-06 NOTE — Progress Notes (Signed)
Office Visit Note   Patient: Peter Manning           Date of Birth: September 28, 1955           MRN: [id number] Visit Date: 10/06/2020              Requested by: Peter Side., FNP [place],  [place] [address]        PCP: Peter Side., FNP   Assessment & Plan: Visit Diagnoses:  1. Spinal stenosis of cervical region   2. Foraminal stenosis of cervical region     Plan: Cervical spondylosis with foraminal stenosis most significant at C3-4 and C4-5.  He has partial ankylosis at C3-4 and may autofused.  Centrally C6-7 is slightly more prominent.  He has some disc protrusion and foraminal narrowing at C5-6.  We offered him single epidural cervical injection he will consider this cough would like to proceed.  Otherwise I will check him in 4 months.  Follow-Up Instructions: Return in about 4 months (around 02/05/2021).   Orders:  No orders of the defined types were placed in this encounter.  No orders of the defined types were placed in this encounter.     Procedures: No procedures performed   Clinical Data: No additional findings.   Subjective: Chief Complaint  Patient presents with  . Neck - Pain, Follow-up    MRI cervical spine review    HPI 65 year old male returns with ongoing problems with chronic neck pain that radiates into his shoulders.  Sometimes has had whole hand numbness of the left hand which is in a glove distribution intermittent.  No myelopathic symptoms.  MRI scan has been obtained which shows moderate to severe foraminal stenosis at C3-4 and C4-5.  Patient has C3-4 spondylosis left side facet degeneration and appears to have partial ankylosis at the C3-4 level since 2011 CT scan.  Patient denies chills or fever.  States his lumbar fusion and hip arthroplasty are doing well.  He is on chronic pain management with Percocet 180 tablets monthly by Peter Manning.   Review of Systems other systems updated unchanged from previous  exam.   Objective: Vital Signs: There were no vitals taken for this visit.  Physical Exam Constitutional:      Appearance: He is well-developed.  HENT:     Head: Normocephalic and atraumatic.  Eyes:     Pupils: Pupils are equal, round, and reactive to light.  Neck:     Thyroid: No thyromegaly.     Trachea: No tracheal deviation.  Cardiovascular:     Rate and Rhythm: Normal rate.  Pulmonary:     Effort: Pulmonary effort is normal.     Breath sounds: No wheezing.  Abdominal:     General: Bowel sounds are normal.     Palpations: Abdomen is soft.  Skin:    General: Skin is warm and dry.     Capillary Refill: Capillary refill takes less than 2 seconds.  Neurological:     Mental Status: He is alert and oriented to person, place, and time.  Psychiatric:        Behavior: Behavior normal.        Thought Content: Thought content normal.        Judgment: Judgment normal.     Ortho Exam patient has significant rotation cervical spine.  Upper semireflexes are 2+.  No thenar atrophy.  Biceps triceps are strong.  No rash over exposed skin.  Patient is amatory without  Trendelenburg gait.  Good range of motion of right total knee arthroplasty.  Specialty Comments:  No specialty comments available.  Imaging:CLINICAL DATA:  Chronic neck pain  EXAM: MRI CERVICAL SPINE WITHOUT CONTRAST  TECHNIQUE: Multiplanar, multisequence MR imaging of the cervical spine was performed. No intravenous contrast was administered.  COMPARISON:  04/10/2018  FINDINGS: Alignment: Normal alignment. Reversal of normal cervical lordosis may be positional or due to muscle spasm.  Vertebrae: Heterogeneous bone marrow signal without aggressive appearing lesion. No fracture.  Cord: Normal signal  Posterior Fossa, vertebral arteries, paraspinal tissues: Negative  Disc levels:  C2-3: Small disc bulge. Unchanged moderate right neural foraminal stenosis. Mild left foraminal narrowing. No spinal  canal stenosis.  C3-4: Moderate left facet hypertrophy. Small central disc protrusion narrowing the ventral thecal sac. No spinal canal stenosis. Mild right and severe left neural foraminal stenosis is unchanged.  C4-5: Severe left facet hypertrophy. Severe left neural foraminal stenosis. No spinal canal or right neural foraminal stenosis.  C5-6: Disc bulge with bilateral uncovertebral hypertrophy. Narrowing of the ventral thecal sac without spinal canal stenosis. Mild right and moderate left foraminal stenosis, unchanged.  C6-7: Small left asymmetric disc bulge with large uncovertebral osteophytes. Narrowing of the ventral thecal sac without spinal canal stenosis. Unchanged mild right moderate left foraminal stenosis.  C7-T1: Negative  IMPRESSION: 1. Unchanged examination with multilevel moderate-to-severe neural foraminal stenosis, worst at left C3-4 and left C4-5. 2. No spinal canal stenosis.   Electronically Signed   By: Peter Jarred M.D.   On: 09/26/2020 23:38     PMFS History: Patient Active Problem List   Diagnosis Date Noted  . Hyperosmolar hyperglycemic coma due to diabetes mellitus without ketoacidosis ([place]) 08/26/2018  . AKI (acute kidney injury) ([place]) 08/26/2018  . Hyperkalemia 08/26/2018  . Spinal stenosis of cervical region 04/20/2018  . Foraminal stenosis of cervical region 04/20/2018  . Chronic left shoulder pain 01/08/2018  . Lumbar foraminal stenosis 01/08/2018  . S/P lumbar fusion 05/03/2017  . Impingement syndrome of left shoulder 05/03/2017  . S/P total knee arthroplasty, right 12/28/2016  . Right knee DJD 10/10/2016  . 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 OSA (obstructive sleep apnea) 12/09/2011  . Chronic bronchitis ([place]) 12/09/2011  . Tobacco abuse 12/09/2011   Past Medical History:  Diagnosis Date  . Bronchitis   . Depression   . Hepatitis 1976   pt. doesn't remember if A, B, C?  . History of kidney stones   .  Hyperlipidemia   . Hypertension   . OSA (obstructive sleep apnea) 12/09/2011   PSG 01/03/12>>AHI 45.7, SpO2 low 73%, PLMI 0.  CPAP 15 cm H2O>>AHI 0, +R.   [facility] Pneumonia 2015  . Renal disorder    had kidney stone  . Sleep apnea    no CPAP ordered per patient/over 3 years    Family History  Problem Relation Age of Onset  . Heart attack Father   . Heart disease Father   . Cancer Mother   . Diabetes Sister   . Diabetes Brother   . Colon cancer Neg Hx   . Esophageal cancer Neg Hx   . Rectal cancer Neg Hx   . Stomach cancer Neg Hx     Past Surgical History:  Procedure Laterality Date  . BACK SURGERY  2001   put in screws per pt.  . COLONOSCOPY    . CYSTOSCOPY WITH RETROGRADE PYELOGRAM, URETEROSCOPY AND STENT PLACEMENT Left 07/06/2017   Procedure: CYSTOSCOPY WITH LEFT RETROGRADE PYELOGRAM, URETEROSCOPY  WITH HOLMIUM LASER BASKET EXTRACTION AND STENT PLACEMENT;  Surgeon: [name], MD;  Location: [facility];  Service: Urology;  Laterality: Left;  . JOINT REPLACEMENT    . REVISION TOTAL HIP ARTHROPLASTY Left 11/2015  . TOTAL HIP ARTHROPLASTY     left hip  . TOTAL HIP REVISION Left 11/18/2015   Procedure: LEFT TOTAL HIP REVISION;  Surgeon: Marybelle Killings, MD;  Location: [facility];  Service: Orthopedics;  Laterality: Left;  . TOTAL KNEE ARTHROPLASTY Right 10/10/2016   Procedure: TOTAL KNEE ARTHROPLASTY;  Surgeon: Marybelle Killings, MD;  Location: [facility];  Service: Orthopedics;  Laterality: Right;   Social History   Occupational History  . Not on file  Tobacco Use  . Smoking status: Current Some Day Smoker    Packs/day: 0.50    Years: 46.00    Pack years: 23.00    Types: Cigarettes  . Smokeless tobacco: Never Used  . Tobacco comment: states on and off smoker  Vaping Use  . Vaping Use: Never used  Substance and Sexual Activity  . Alcohol use: No  . Drug use: No  . Sexual activity: Not on file

## 2020-11-09 ENCOUNTER — Encounter: Payer: 59 | Attending: Physical Medicine and Rehabilitation | Admitting: Physical Medicine and Rehabilitation

## 2021-02-03 ENCOUNTER — Ambulatory Visit: Payer: 59 | Admitting: Orthopaedic Surgery

## 2021-02-09 ENCOUNTER — Other Ambulatory Visit: Payer: Self-pay

## 2021-02-09 ENCOUNTER — Ambulatory Visit (INDEPENDENT_AMBULATORY_CARE_PROVIDER_SITE_OTHER): Payer: Medicare Other | Admitting: Orthopaedic Surgery

## 2021-02-09 ENCOUNTER — Encounter: Payer: Self-pay | Admitting: Orthopaedic Surgery

## 2021-02-09 ENCOUNTER — Ambulatory Visit: Payer: Self-pay

## 2021-02-09 VITALS — BP 114/71 | HR 68 | Ht 68.0 in | Wt 242.8 lb

## 2021-02-09 DIAGNOSIS — M545 Low back pain, unspecified: Secondary | ICD-10-CM | POA: Diagnosis not present

## 2021-02-09 DIAGNOSIS — M25552 Pain in left hip: Secondary | ICD-10-CM | POA: Diagnosis not present

## 2021-02-09 NOTE — Progress Notes (Signed)
Office Visit Note   Patient: Peter Manning           Date of Birth: 1956/05/27           MRN: [id number] Visit Date: 02/09/2021              Requested by: Sonia Side., FNP [place],  [place] [address] PCP: Sonia Side., FNP   Assessment & Plan: Visit Diagnoses:  1. Pain in left hip   2. Left-sided low back pain without sciatica, unspecified chronicity     Plan: Discussed with patient is pain about his hip is likely coming from some adjacent level degeneration at the L3-4 level where he had moderate foraminal stenosis back in 2018 on imaging studies.  We will set him up for single left L3-4 epidural injection.  I reviewed the images of his hip and discussed them there is no evidence of excessive wear or loosening and hip remains looking good.  He can follow-up after the epidural.  Follow-Up Instructions: No follow-ups on file.   Orders:  Orders Placed This Encounter  Procedures   XR HIP UNILAT W OR W/O PELVIS 2-3 VIEWS LEFT   Ambulatory referral to Physical Medicine Rehab   No orders of the defined types were placed in this encounter.     Procedures: No procedures performed   Clinical Data: No additional findings.   Subjective: Chief Complaint  Patient presents with   Left Hip - Pain    HPI 65 year old male returns with complaints of left hip pain left thigh pain.  Patient had history of left total hip arthroplasty 2001.  16 years later he had to have revision for broken liner and polyethylene wear.  I revised this in May 2017.  His last x-rays were 2019 and he states he has had some burning and stinging pain radiates into his buttocks some is over the left SI joint radiates into his thigh does not seem to go past his knee.  He has had two-level lumbar instrumented fusion and last lumbar imaging studies 2018 showed good decompression of the two-level lumbar L4-S1 but he did have moderate foraminal narrowing at L3-4 with adjacent level disc  degeneration.  Patient's lumbar fusion was 2012 by me.  Patient currently on oxycodone 10/325    6 tablets daily.  Long multiyear history of chronic narcotic pain management. Previous imaging studies lumbar spine showed no residual compression at L4-S1 and solid fusion. Review of Systems positive for sleep apnea cervical disc degeneration.,  Shoulder impingement.  Tobacco use.   Objective: Vital Signs: BP 114/71   Pulse 68   Ht '5\' 8"'$  (1.727 m)   Wt 242 lb 12.8 oz (110.1 kg)   BMI 36.92 kg/m   Physical Exam Constitutional:      Appearance: He is well-developed.  HENT:     Head: Normocephalic and atraumatic.     Right Ear: External ear normal.     Left Ear: External ear normal.  Eyes:     Pupils: Pupils are equal, round, and reactive to light.  Neck:     Thyroid: No thyromegaly.     Trachea: No tracheal deviation.  Cardiovascular:     Rate and Rhythm: Normal rate.  Pulmonary:     Effort: Pulmonary effort is normal.     Breath sounds: No wheezing.  Abdominal:     General: Bowel sounds are normal.     Palpations: Abdomen is soft.  Musculoskeletal:  Cervical back: Neck supple.  Skin:    General: Skin is warm and dry.     Capillary Refill: Capillary refill takes less than 2 seconds.  Neurological:     Mental Status: He is alert and oriented to person, place, and time.  Psychiatric:        Behavior: Behavior normal.        Thought Content: Thought content normal.        Judgment: Judgment normal.    Ortho Exam negative straight leg raising lumbar incisions well-healed negative logroll of the hips.  Leg lengths are equal.  Knees reach full extension.  Specialty Comments:  No specialty comments available.  Imaging: No results found.   PMFS History: Patient Active Problem List   Diagnosis Date Noted   Hyperosmolar hyperglycemic coma due to diabetes mellitus without ketoacidosis ([place]) 08/26/2018   AKI (acute kidney injury) ([place]) 08/26/2018   Hyperkalemia 08/26/2018    Spinal stenosis of cervical region 04/20/2018   Foraminal stenosis of cervical region 04/20/2018   Chronic left shoulder pain 01/08/2018   Lumbar foraminal stenosis 01/08/2018   S/P lumbar fusion 05/03/2017   Impingement syndrome of left shoulder 05/03/2017   S/P total knee arthroplasty, right 12/28/2016   Right knee DJD 10/10/2016   S/P revision of total hip 11/18/2015   OSA (obstructive sleep apnea) 12/09/2011   Chronic bronchitis ([place]) 12/09/2011   Tobacco abuse 12/09/2011   Past Medical History:  Diagnosis Date   Bronchitis    Depression    Hepatitis 1976   pt. doesn't remember if A, B, C?   History of kidney stones    Hyperlipidemia    Hypertension    OSA (obstructive sleep apnea) 12/09/2011   PSG 01/03/12>>AHI 45.7, SpO2 low 73%, PLMI 0.  CPAP 15 cm H2O>>AHI 0, +R.    Pneumonia 2015   Renal disorder    had kidney stone   Sleep apnea    no CPAP ordered per patient/over 3 years    Family History  Problem Relation Age of Onset   Heart attack Father    Heart disease Father    Cancer Mother    Diabetes Sister    Diabetes Brother    Colon cancer Neg Hx    Esophageal cancer Neg Hx    Rectal cancer Neg Hx    Stomach cancer Neg Hx     Past Surgical History:  Procedure Laterality Date   BACK SURGERY  2001   put in screws per pt.   COLONOSCOPY     CYSTOSCOPY WITH RETROGRADE PYELOGRAM, URETEROSCOPY AND STENT PLACEMENT Left 07/06/2017   Procedure: CYSTOSCOPY WITH LEFT RETROGRADE PYELOGRAM, URETEROSCOPY WITH HOLMIUM LASER BASKET EXTRACTION AND STENT PLACEMENT;  Surgeon: [name], MD;  Location[facility];  Service: Urology;  Laterality: Left;   JOINT REPLACEMENT     REVISION TOTAL HIP ARTHROPLASTY Left 11/2015   TOTAL HIP ARTHROPLASTY     left hip   TOTAL HIP REVISION Left 11/18/2015   Procedure: LEFT TOTAL HIP REVISION;  Surgeon: Marybelle Killings, MD;  Location: [facility];  Service: Orthopedics;  Laterality: Left;   TOTAL KNEE ARTHROPLASTY Right 10/10/2016    Procedure: TOTAL KNEE ARTHROPLASTY;  Surgeon: Marybelle Killings, MD;  Location: [facility];  Service: Orthopedics;  Laterality: Right;   Social History   Occupational History   Not on file  Tobacco Use   Smoking status: Some Days    Packs/day: 0.50    Years: 46.00    Pack  years: 23.00    Types: Cigarettes   Smokeless tobacco: Never   Tobacco comments:    states on and off smoker  Vaping Use   Vaping Use: Never used  Substance and Sexual Activity   Alcohol use: No   Drug use: No   Sexual activity: Not on file

## 2021-02-17 ENCOUNTER — Telehealth: Payer: Self-pay | Admitting: Physical Medicine and Rehabilitation

## 2021-02-17 NOTE — Telephone Encounter (Signed)
Pt called back and would like to schedule with newton [phone]

## 2021-03-03 ENCOUNTER — Ambulatory Visit: Payer: Medicare Other | Admitting: Physical Medicine and Rehabilitation

## 2021-03-20 ENCOUNTER — Emergency Department (HOSPITAL_COMMUNITY)
Admission: EM | Admit: 2021-03-20 | Discharge: 2021-03-20 | Disposition: A | Discharge status: Home / Self Care | Payer: Medicare Other | Attending: Emergency Medicine | Admitting: Emergency Medicine

## 2021-03-20 ENCOUNTER — Other Ambulatory Visit: Payer: Self-pay

## 2021-03-20 ENCOUNTER — Encounter (HOSPITAL_COMMUNITY): Payer: Self-pay | Admitting: Emergency Medicine

## 2021-03-20 ENCOUNTER — Emergency Department (HOSPITAL_COMMUNITY): Payer: Medicare Other

## 2021-03-20 DIAGNOSIS — Z794 Long term (current) use of insulin: Secondary | ICD-10-CM | POA: Insufficient documentation

## 2021-03-20 DIAGNOSIS — F17210 Nicotine dependence, cigarettes, uncomplicated: Secondary | ICD-10-CM | POA: Insufficient documentation

## 2021-03-20 DIAGNOSIS — I10 Essential (primary) hypertension: Secondary | ICD-10-CM | POA: Insufficient documentation

## 2021-03-20 DIAGNOSIS — Z7984 Long term (current) use of oral hypoglycemic drugs: Secondary | ICD-10-CM | POA: Insufficient documentation

## 2021-03-20 DIAGNOSIS — Z96642 Presence of left artificial hip joint: Secondary | ICD-10-CM | POA: Diagnosis not present

## 2021-03-20 DIAGNOSIS — R39198 Other difficulties with micturition: Secondary | ICD-10-CM | POA: Insufficient documentation

## 2021-03-20 DIAGNOSIS — R109 Unspecified abdominal pain: Secondary | ICD-10-CM | POA: Insufficient documentation

## 2021-03-20 DIAGNOSIS — M25552 Pain in left hip: Secondary | ICD-10-CM | POA: Diagnosis not present

## 2021-03-20 DIAGNOSIS — Z79899 Other long term (current) drug therapy: Secondary | ICD-10-CM | POA: Insufficient documentation

## 2021-03-20 DIAGNOSIS — E1100 Type 2 diabetes mellitus with hyperosmolarity without nonketotic hyperglycemic-hyperosmolar coma (NKHHC): Secondary | ICD-10-CM | POA: Insufficient documentation

## 2021-03-20 DIAGNOSIS — Z96651 Presence of right artificial knee joint: Secondary | ICD-10-CM | POA: Insufficient documentation

## 2021-03-20 DIAGNOSIS — M25559 Pain in unspecified hip: Secondary | ICD-10-CM

## 2021-03-20 LAB — URINALYSIS, ROUTINE W REFLEX MICROSCOPIC
Bilirubin Urine: NEGATIVE
Glucose, UA: NEGATIVE mg/dL
Ketones, ur: NEGATIVE mg/dL
Leukocytes,Ua: NEGATIVE
Nitrite: NEGATIVE
Protein, ur: NEGATIVE mg/dL
Specific Gravity, Urine: 1.025 (ref 1.005–1.030)
pH: 6 (ref 5.0–8.0)

## 2021-03-20 LAB — URINALYSIS, MICROSCOPIC (REFLEX): Bacteria, UA: NONE SEEN

## 2021-03-20 MED ORDER — NAPROXEN 375 MG PO TABS: 375.0000 mg | ORAL_TABLET | Freq: Two times a day (BID) | ORAL | 0 refills | Status: DC

## 2021-03-20 NOTE — ED Triage Notes (Signed)
Pt reports L lateral side pain and L hip pain x 1 month.  Also reports difficulty urinating.

## 2021-03-20 NOTE — Discharge Instructions (Addendum)
Medication to see if that helps with the discomfort.  Follow-up with your orthopedic doctor for further evaluation

## 2021-03-20 NOTE — ED Provider Notes (Signed)
[facility] EMERGENCY DEPARTMENT Provider Note   CSN: [id number] Arrival date & time: 03/20/21  7829     History Chief complaint: Left side hip pain  Peter Manning is a 65 y.o. male.  HPI  Patient states he has been having pain in his left flank and hip area for the last 6 months.  Patient states he does have history of chronic back problems.  He had a spinal stimulator placed.  He had it removed because he did not think it was helping.  Ever since then he has had this sharp stinging pain in that left hip area.  He feels it is right around the scar.  He has not seen anyone for this since it started.  Patient also has noticed some difficulty urinating recently.  No fevers or chills.  No vomiting or diarrhea.  No recent falls or injuries  Past Medical History:  Diagnosis Date   Bronchitis    Depression    Hepatitis 1976   pt. doesn't remember if A, B, C?   History of kidney stones    Hyperlipidemia    Hypertension    OSA (obstructive sleep apnea) 12/09/2011   PSG 01/03/12>>AHI 45.7, SpO2 low 73%, PLMI 0.  CPAP 15 cm H2O>>AHI 0, +R.    Pneumonia 2015   Renal disorder    had kidney stone   Sleep apnea    no CPAP ordered per patient/over 3 years    Patient Active Problem List   Diagnosis Date Noted   Hyperosmolar hyperglycemic coma due to diabetes mellitus without ketoacidosis ([place]) 08/26/2018   AKI (acute kidney injury) ([place]) 08/26/2018   Hyperkalemia 08/26/2018   Spinal stenosis of cervical region 04/20/2018   Foraminal stenosis of cervical region 04/20/2018   Chronic left shoulder pain 01/08/2018   Lumbar foraminal stenosis 01/08/2018   S/P lumbar fusion 05/03/2017   Impingement syndrome of left shoulder 05/03/2017   S/P total knee arthroplasty, right 12/28/2016   Right knee DJD 10/10/2016   S/P revision of total hip 11/18/2015   OSA (obstructive sleep apnea) 12/09/2011   Chronic bronchitis ([place]) 12/09/2011   Tobacco abuse 12/09/2011    Past  Surgical History:  Procedure Laterality Date   BACK SURGERY  2001   put in screws per pt.   COLONOSCOPY     CYSTOSCOPY WITH RETROGRADE PYELOGRAM, URETEROSCOPY AND STENT PLACEMENT Left 07/06/2017   Procedure: CYSTOSCOPY WITH LEFT RETROGRADE PYELOGRAM, URETEROSCOPY WITH HOLMIUM LASER BASKET EXTRACTION AND STENT PLACEMENT;  Surgeon: [name], MD;  Location: [facility];  Service: Urology;  Laterality: Left;   JOINT REPLACEMENT     REVISION TOTAL HIP ARTHROPLASTY Left 11/2015   TOTAL HIP ARTHROPLASTY     left hip   TOTAL HIP REVISION Left 11/18/2015   Procedure: LEFT TOTAL HIP REVISION;  Surgeon: Marybelle Killings, MD;  Location: [facility];  Service: Orthopedics;  Laterality: Left;   TOTAL KNEE ARTHROPLASTY Right 10/10/2016   Procedure: TOTAL KNEE ARTHROPLASTY;  Surgeon: Marybelle Killings, MD;  Location: [place];  Service: Orthopedics;  Laterality: Right;       Family History  Problem Relation Age of Onset   Heart attack Father    Heart disease Father    Cancer Mother    Diabetes Sister    Diabetes Brother    Colon cancer Neg Hx    Esophageal cancer Neg Hx    Rectal cancer Neg Hx    Stomach cancer Neg Hx     Social  History   Tobacco Use   Smoking status: Some Days    Packs/day: 0.50    Years: 46.00    Pack years: 23.00    Types: Cigarettes   Smokeless tobacco: Never   Tobacco comments:    states on and off smoker  Vaping Use   Vaping Use: Never used  Substance Use Topics   Alcohol use: No   Drug use: No    Home Medications Prior to Admission medications   Medication Sig Start Date End Date Taking? Authorizing Provider  naproxen (NAPROSYN) 375 MG tablet Take 1 tablet (375 mg total) by mouth 2 (two) times daily. 03/20/21  Yes Dorie Rank, MD  albuterol (PROVENTIL HFA;VENTOLIN HFA) 108 (90 Base) MCG/ACT inhaler Inhale 2 puffs into the lungs every 4 (four) hours as needed for wheezing or shortness of breath. 07/19/17   Lacretia Leigh, MD  amLODipine (NORVASC) 10 MG tablet Take  10 mg by mouth daily.  08/24/16   [provider]  atorvastatin (LIPITOR) 40 MG tablet Take 40 mg by mouth at bedtime. 10/10/19   [provider]  blood glucose meter kit and supplies KIT Dispense based on patient and insurance preference. Use up to four times daily as directed. (FOR ICD-9 250.00, 250.01). 08/29/18   Charlynne Cousins, MD  diazepam (VALIUM) 5 MG tablet Take 0.5 tablets (2.5 mg total) by mouth every 8 (eight) hours as needed for anxiety (spasms). 07/26/20   Mesner, Corene Cornea, MD  diclofenac Sodium (VOLTAREN) 1 % GEL Apply 2 g topically 4 (four) times daily. 07/28/20   Tedd Sias, PA  gabapentin (NEURONTIN) 800 MG tablet Take 800 mg by mouth 3 (three) times daily. 10/11/19   [provider]  hydrOXYzine (ATARAX/VISTARIL) 50 MG tablet Take 50 mg by mouth every 8 (eight) hours as needed for anxiety.  07/10/18   [provider]  insulin aspart (NOVOLOG PENFILL) cartridge Inject 3 Units into the skin 3 (three) times daily with meals. 08/29/18   Charlynne Cousins, MD  Insulin Detemir (LEVEMIR) 100 UNIT/ML Pen Inject 30 Units into the skin daily. 08/29/18   Charlynne Cousins, MD  Insulin Pen Needle 31G X 6 MM MISC 1 Device by Does not apply route 2 (two) times daily. 08/29/18   Charlynne Cousins, MD  metFORMIN (GLUCOPHAGE) 500 MG tablet Take 1 tablet (500 mg total) by mouth 2 (two) times daily with a meal. 08/29/18 11/02/28  Charlynne Cousins, MD  methocarbamol (ROBAXIN) 500 MG tablet Take 1 tablet (500 mg total) by mouth 2 (two) times daily. 07/28/20   Tedd Sias, PA  oxyCODONE-acetaminophen (PERCOCET) 10-325 MG tablet Take 1 tablet by mouth 4 (four) times daily.    [provider]  SYMBICORT 160-4.5 MCG/ACT inhaler Inhale 2 puffs into the lungs daily as needed (shortness of breath).  08/05/18   [provider]  traZODone (DESYREL) 50 MG tablet Take 50 mg by mouth at bedtime as needed for sleep.  08/21/18   [provider]   Vitamin D, Ergocalciferol, (DRISDOL) 1.25 MG (50000 UNIT) CAPS capsule Take 50,000 Units by mouth every Monday. 10/07/19   [provider]    Allergies    Patient has no known allergies.  Review of Systems   Review of Systems  All other systems reviewed and are negative.  Physical Exam Updated Vital Signs BP (!) 102/55   Pulse 61   Temp 98.4 F (36.9 C) (Oral)   Resp 18   SpO2 97%  Physical Exam Vitals and nursing note reviewed.  Constitutional:      General: He is not in acute distress.    Appearance: He is well-developed.  HENT:     Head: Normocephalic and atraumatic.     Right Ear: External ear normal.     Left Ear: External ear normal.  Eyes:     General: No scleral icterus.       Right eye: No discharge.        Left eye: No discharge.     Conjunctiva/sclera: Conjunctivae normal.  Neck:     Trachea: No tracheal deviation.  Cardiovascular:     Rate and Rhythm: Normal rate and regular rhythm.  Pulmonary:     Effort: Pulmonary effort is normal. No respiratory distress.     Breath sounds: Normal breath sounds. No stridor. No wheezing or rales.  Abdominal:     General: Bowel sounds are normal. There is no distension.     Palpations: Abdomen is soft.     Tenderness: There is no abdominal tenderness. There is no guarding or rebound.  Musculoskeletal:        General: Tenderness present. No swelling or deformity.     Cervical back: Neck supple.     Comments: Healed surgical noted along the left hip, no induration, no mass no erythema of the hip, mild tenderness to palpation  Skin:    General: Skin is warm and dry.     Findings: No rash.  Neurological:     General: No focal deficit present.     Mental Status: He is alert.     Cranial Nerves: No cranial nerve deficit (no facial droop, extraocular movements intact, no slurred speech).     Sensory: No sensory deficit.     Motor: No abnormal muscle tone or seizure activity.     Coordination: Coordination  normal.     Comments: Distal neurovascular intact, no cyanosis  Psychiatric:        Mood and Affect: Mood normal.    ED Results / Procedures / Treatments   Labs (all labs ordered are listed, but only abnormal results are displayed) Labs Reviewed  URINALYSIS, ROUTINE W REFLEX MICROSCOPIC - Abnormal; Notable for the following components:      Result Value   Hgb urine dipstick MODERATE (*)    All other components within normal limits  URINALYSIS, MICROSCOPIC (REFLEX)    EKG None  Radiology DG Hip Unilat W or Wo Pelvis 2-3 Views Left  Result Date: 03/20/2021 CLINICAL DATA:  Left hip pain for a month. Left hip surgery in 2001 and 2019. EXAM: DG HIP (WITH OR WITHOUT PELVIS) 2-3V LEFT COMPARISON:  None. FINDINGS: Pedicle rods and screws are seen in the lower lumbar spine. The patient is status post left hip replacement. The acetabular component is in good position. No dislocation. No evidence of loosening or failure. No fracture. No other acute abnormalities. IMPRESSION: No cause for the patient's pain identified. Left hip replacement as above. Electronically Signed   By: Dorise Bullion III M.D.   On: 03/20/2021 09:52    Procedures Procedures   Medications Ordered in ED Medications - No data to display  ED Course  I have reviewed the triage vital signs and the nursing notes.  Pertinent labs & imaging results that were available during my care of the patient were reviewed by me and considered in my medical decision making (see chart for details).    MDM Rules/Calculators/A&P  Patient presented to the ED for evaluation of left hip pain.  Symptoms ongoing for months.  Patient has had prior hip surgery.  No signs of infection.  Suspect symptoms could be radicular in nature.  I have reviewed prior records and the patient has seen orthopedics.  He has been told that symptoms could be related to spinal stenosis.  No signs of any acute emergency condition at this  time.  Recommend he follow-up with his orthopedic doctor for further evaluation.  He mention some urinary symptoms but no signs of infection or findings to suggest kidney stone.  Patient appears stable for discharge Final Clinical Impression(s) / ED Diagnoses Final diagnoses:  Hip pain    Rx / DC Orders ED Discharge Orders          Ordered    naproxen (NAPROSYN) 375 MG tablet  2 times daily        03/20/21 1032             Dorie Rank, MD 03/20/21 1035

## 2021-04-10 ENCOUNTER — Encounter (HOSPITAL_COMMUNITY): Payer: Self-pay | Admitting: Emergency Medicine

## 2021-04-10 ENCOUNTER — Emergency Department (HOSPITAL_COMMUNITY)
Admission: EM | Admit: 2021-04-10 | Discharge: 2021-04-10 | Disposition: A | Discharge status: Home / Self Care | Payer: Medicare Other | Attending: Emergency Medicine | Admitting: Emergency Medicine

## 2021-04-10 ENCOUNTER — Emergency Department (HOSPITAL_COMMUNITY): Payer: Medicare Other

## 2021-04-10 DIAGNOSIS — Z96651 Presence of right artificial knee joint: Secondary | ICD-10-CM | POA: Diagnosis not present

## 2021-04-10 DIAGNOSIS — I10 Essential (primary) hypertension: Secondary | ICD-10-CM | POA: Insufficient documentation

## 2021-04-10 DIAGNOSIS — Z96642 Presence of left artificial hip joint: Secondary | ICD-10-CM | POA: Diagnosis not present

## 2021-04-10 DIAGNOSIS — F17210 Nicotine dependence, cigarettes, uncomplicated: Secondary | ICD-10-CM | POA: Diagnosis not present

## 2021-04-10 DIAGNOSIS — M25552 Pain in left hip: Secondary | ICD-10-CM | POA: Diagnosis not present

## 2021-04-10 DIAGNOSIS — Z794 Long term (current) use of insulin: Secondary | ICD-10-CM | POA: Insufficient documentation

## 2021-04-10 DIAGNOSIS — Z79899 Other long term (current) drug therapy: Secondary | ICD-10-CM | POA: Diagnosis not present

## 2021-04-10 DIAGNOSIS — M545 Low back pain, unspecified: Secondary | ICD-10-CM | POA: Insufficient documentation

## 2021-04-10 DIAGNOSIS — Z7984 Long term (current) use of oral hypoglycemic drugs: Secondary | ICD-10-CM | POA: Insufficient documentation

## 2021-04-10 DIAGNOSIS — E119 Type 2 diabetes mellitus without complications: Secondary | ICD-10-CM | POA: Insufficient documentation

## 2021-04-10 MED ORDER — LIDOCAINE 5 % EX PTCH
1.0000 | MEDICATED_PATCH | CUTANEOUS | Status: DC
  Administered 2021-04-10: 1 via TRANSDERMAL
  Filled 2021-04-10: qty 1

## 2021-04-10 MED ORDER — METHYLPREDNISOLONE 4 MG PO TBPK: ORAL_TABLET | ORAL | 0 refills | Status: DC

## 2021-04-10 MED ORDER — LIDOCAINE 5 % EX PTCH: 1.0000 | MEDICATED_PATCH | CUTANEOUS | 0 refills | Status: DC

## 2021-04-10 NOTE — ED Triage Notes (Signed)
Patient here from home reporting left hip pain x1-2 months. Reports that "every since they removed the device for my pain I've been like this". Denies injury. Reports left hip has been replaced twice.

## 2021-04-10 NOTE — ED Provider Notes (Signed)
Emergency Medicine Provider Triage Evaluation Note  Peter Manning , a 65 y.o. male  was evaluated in triage.  Pt complains of left hip pain.  Reports he has been having this pain over the last 1 to 2 months.  Pain has been worse over the last 2 to 3 weeks.  Reports pain is worse with ambulation.  Pain described as a "stinging."  No recent falls or injuries.  Patient states that he goes to the pain clinic and his prescribed pain medication helps with his pain.  Review of Systems  Positive: Left hip pain Negative: Numbness, weakness  Physical Exam  BP (!) 171/83 (BP Location: Left Arm)   Pulse 82   Temp 98.9 F (37.2 C) (Oral)   Resp 20   Ht 5\' 8"  (1.727 m)   Wt 110.2 kg   SpO2 91%   BMI 36.95 kg/m  Gen:   Awake, no distress   Resp:  Normal effort  MSK:   Moves extremities without difficulty  Other:  +2 left DP pulse.  Motor and sensation intact distally from left hip.  Medical Decision Making  Medically screening exam initiated at 6:46 AM.  Appropriate orders placed.  Ademola Vert was informed that the remainder of the evaluation will be completed by another provider, this initial triage assessment does not replace that evaluation, and the importance of remaining in the ED until their evaluation is complete.  X-ray imaging ordered.   Loni Beckwith, PA-C 04/10/21 3159    Veryl Speak, MD 04/13/21 [phone]

## 2021-04-10 NOTE — ED Provider Notes (Signed)
[facility] DEPT Provider Note   CSN: [id number] Arrival date & time: 04/10/21  0550     History Chief Complaint  Patient presents with   Hip Pain    Peter Manning is a 65 y.o. male.  HPI  65 year old male with past medical history of HTN, HLD, chronic lower back/hip pain presents the emergency department with left-sided hip pain.  Patient has previously had a spinal stimulator secondary to this pain with diagnosis of spinal stenosis.  He states that they took the spinal stimulator out a couple years ago and since then he has been having worsening lower back/left hip pain.  He has had left hip replaced multiple times.  No recent injury to the area.  He describes radicular-like pain shooting down the left leg to the knee.  Denies any numbness/weakness in the lower extremity.  No color changes.  No fever, abdominal or genitourinary symptoms.  Past Medical History:  Diagnosis Date   Bronchitis    Depression    Hepatitis 1976   pt. doesn't remember if A, B, C?   History of kidney stones    Hyperlipidemia    Hypertension    OSA (obstructive sleep apnea) 12/09/2011   PSG 01/03/12>>AHI 45.7, SpO2 low 73%, PLMI 0.  CPAP 15 cm H2O>>AHI 0, +R.    Pneumonia 2015   Renal disorder    had kidney stone   Sleep apnea    no CPAP ordered per patient/over 3 years    Patient Active Problem List   Diagnosis Date Noted   Hyperosmolar hyperglycemic coma due to diabetes mellitus without ketoacidosis ([place]) 08/26/2018   AKI (acute kidney injury) ([place]) 08/26/2018   Hyperkalemia 08/26/2018   Spinal stenosis of cervical region 04/20/2018   Foraminal stenosis of cervical region 04/20/2018   Chronic left shoulder pain 01/08/2018   Lumbar foraminal stenosis 01/08/2018   S/P lumbar fusion 05/03/2017   Impingement syndrome of left shoulder 05/03/2017   S/P total knee arthroplasty, right 12/28/2016   Right knee DJD 10/10/2016   S/P revision of total hip 11/18/2015   OSA  (obstructive sleep apnea) 12/09/2011   Chronic bronchitis ([place]) 12/09/2011   Tobacco abuse 12/09/2011    Past Surgical History:  Procedure Laterality Date   BACK SURGERY  2001   put in screws per pt.   COLONOSCOPY     CYSTOSCOPY WITH RETROGRADE PYELOGRAM, URETEROSCOPY AND STENT PLACEMENT Left 07/06/2017   Procedure: CYSTOSCOPY WITH LEFT RETROGRADE PYELOGRAM, URETEROSCOPY WITH HOLMIUM LASER BASKET EXTRACTION AND STENT PLACEMENT;  Surgeon: [name], MD;  Location: [facility];  Service: Urology;  Laterality: Left;   JOINT REPLACEMENT     REVISION TOTAL HIP ARTHROPLASTY Left 11/2015   TOTAL HIP ARTHROPLASTY     left hip   TOTAL HIP REVISION Left 11/18/2015   Procedure: LEFT TOTAL HIP REVISION;  Surgeon: Marybelle Killings, MD;  Location: [facility];  Service: Orthopedics;  Laterality: Left;   TOTAL KNEE ARTHROPLASTY Right 10/10/2016   Procedure: TOTAL KNEE ARTHROPLASTY;  Surgeon: Marybelle Killings, MD;  Location: [facility];  Service: Orthopedics;  Laterality: Right;       Family History  Problem Relation Age of Onset   Heart attack Father    Heart disease Father    Cancer Mother    Diabetes Sister    Diabetes Brother    Colon cancer Neg Hx    Esophageal cancer Neg Hx    Rectal cancer Neg Hx    Stomach cancer Neg  Hx     Social History   Tobacco Use   Smoking status: Some Days    Packs/day: 0.50    Years: 46.00    Pack years: 23.00    Types: Cigarettes   Smokeless tobacco: Never   Tobacco comments:    states on and off smoker  Vaping Use   Vaping Use: Never used  Substance Use Topics   Alcohol use: No   Drug use: No    Home Medications Prior to Admission medications   Medication Sig Start Date End Date Taking? Authorizing Provider  lidocaine (LIDODERM) 5 % Place 1 patch onto the skin daily. Remove & Discard patch within 12 hours or as directed by MD 04/10/21  Yes Vear Staton, Alvin Critchley, DO  methylPREDNISolone (MEDROL DOSEPAK) 4 MG TBPK tablet Take as directed 04/10/21  Yes  Pearlene Teat M, DO  albuterol (PROVENTIL HFA;VENTOLIN HFA) 108 (90 Base) MCG/ACT inhaler Inhale 2 puffs into the lungs every 4 (four) hours as needed for wheezing or shortness of breath. 07/19/17   Lacretia Leigh, MD  amLODipine (NORVASC) 10 MG tablet Take 10 mg by mouth daily.  08/24/16   [provider]  atorvastatin (LIPITOR) 40 MG tablet Take 40 mg by mouth at bedtime. 10/10/19   [provider]  blood glucose meter kit and supplies KIT Dispense based on patient and insurance preference. Use up to four times daily as directed. (FOR ICD-9 250.00, 250.01). 08/29/18   Charlynne Cousins, MD  diazepam (VALIUM) 5 MG tablet Take 0.5 tablets (2.5 mg total) by mouth every 8 (eight) hours as needed for anxiety (spasms). 07/26/20   Mesner, Corene Cornea, MD  diclofenac Sodium (VOLTAREN) 1 % GEL Apply 2 g topically 4 (four) times daily. 07/28/20   Tedd Sias, PA  gabapentin (NEURONTIN) 800 MG tablet Take 800 mg by mouth 3 (three) times daily. 10/11/19   [provider]  hydrOXYzine (ATARAX/VISTARIL) 50 MG tablet Take 50 mg by mouth every 8 (eight) hours as needed for anxiety.  07/10/18   [provider]  insulin aspart (NOVOLOG PENFILL) cartridge Inject 3 Units into the skin 3 (three) times daily with meals. 08/29/18   Charlynne Cousins, MD  Insulin Detemir (LEVEMIR) 100 UNIT/ML Pen Inject 30 Units into the skin daily. 08/29/18   Charlynne Cousins, MD  Insulin Pen Needle 31G X 6 MM MISC 1 Device by Does not apply route 2 (two) times daily. 08/29/18   Charlynne Cousins, MD  metFORMIN (GLUCOPHAGE) 500 MG tablet Take 1 tablet (500 mg total) by mouth 2 (two) times daily with a meal. 08/29/18 11/02/28  Charlynne Cousins, MD  methocarbamol (ROBAXIN) 500 MG tablet Take 1 tablet (500 mg total) by mouth 2 (two) times daily. 07/28/20   Tedd Sias, PA  naproxen (NAPROSYN) 375 MG tablet Take 1 tablet (375 mg total) by mouth 2 (two) times daily. 03/20/21   Dorie Rank, MD   oxyCODONE-acetaminophen (PERCOCET) 10-325 MG tablet Take 1 tablet by mouth 4 (four) times daily.    [provider]  SYMBICORT 160-4.5 MCG/ACT inhaler Inhale 2 puffs into the lungs daily as needed (shortness of breath).  08/05/18   [provider]  traZODone (DESYREL) 50 MG tablet Take 50 mg by mouth at bedtime as needed for sleep.  08/21/18   [provider]  Vitamin D, Ergocalciferol, (DRISDOL) 1.25 MG (50000 UNIT) CAPS capsule Take 50,000 Units by mouth every Monday. 10/07/19   [provider]    Allergies  Patient has no known allergies.  Review of Systems   Review of Systems  Constitutional:  Negative for chills and fever.  HENT:  Negative for congestion.   Eyes:  Negative for visual disturbance.  Respiratory:  Negative for shortness of breath.   Cardiovascular:  Negative for chest pain.  Gastrointestinal:  Negative for abdominal distention, abdominal pain, diarrhea, nausea and vomiting.  Genitourinary:  Negative for difficulty urinating, dysuria, flank pain, frequency and hematuria.  Musculoskeletal:  Positive for back pain.       +left hip pain  Skin:  Negative for rash.  Neurological:  Negative for headaches.   Physical Exam Updated Vital Signs BP 99/74   Pulse 73   Temp 98.9 F (37.2 C) (Oral)   Resp 16   Ht _0  (1.727 m)   Wt 110.2 kg   SpO2 98%   BMI 36.95 kg/m   Physical Exam Vitals and nursing note reviewed.  Constitutional:      Appearance: Normal appearance.  HENT:     Head: Normocephalic.     Mouth/Throat:     Mouth: Mucous membranes are moist.  Cardiovascular:     Rate and Rhythm: Normal rate.  Pulmonary:     Effort: Pulmonary effort is normal. No respiratory distress.  Abdominal:     Palpations: Abdomen is soft.     Tenderness: There is no abdominal tenderness.  Musculoskeletal:     Comments: Reproducible tenderness to palpation of the left lateral lower lumbar musculature going into the left buttocks and left  hip, left lower extremity is otherwise neurovascularly intact  Skin:    General: Skin is warm.  Neurological:     Mental Status: He is alert and oriented to person, place, and time. Mental status is at baseline.  Psychiatric:        Mood and Affect: Mood normal.    ED Results / Procedures / Treatments   Labs (all labs ordered are listed, but only abnormal results are displayed) Labs Reviewed - No data to display  EKG None  Radiology DG Hip Unilat W or Wo Pelvis 2-3 Views Left  Result Date: 04/10/2021 CLINICAL DATA:  64 year old male with history of left-sided hip pain. EXAM: DG HIP (WITH OR WITHOUT PELVIS) 2-3V LEFT COMPARISON:  03/20/2021. FINDINGS: Status post left hip arthroplasty. The femoral and acetabular components of the prosthesis appear well seated without definite periprosthetic fracture or other acute abnormality. The prosthetic femoral head is located within the prosthetic acetabulum. Bony pelvic ring appears intact. There is joint space narrowing, subchondral sclerosis, subchondral cyst formation and osteophyte formation in the right hip joint, compatible with moderate to severe osteoarthritis. Spinal fixation hardware is again noted. IMPRESSION: 1. No acute radiographic abnormality of the bony pelvis or the left hip. 2. Stable appearance of left hip arthroplasty. 3. Moderate to severe right hip joint osteoarthritis. Electronically Signed   By: Vinnie Langton M.D.   On: 04/10/2021 07:29    Procedures Procedures   Medications Ordered in ED Medications  lidocaine (LIDODERM) 5 % 1 patch (1 patch Transdermal Patch Applied 04/10/21 0848)    ED Course  I have reviewed the triage vital signs and the nursing notes.  Pertinent labs & imaging results that were available during my care of the patient were reviewed by me and considered in my medical decision making (see chart for details).    MDM Rules/Calculators/A&P  65 year old male presents the  emergency department with chronic ongoing left lower back/hip pain.  This is atraumatic, most likely secondary to spinal stenosis of which she has required a spinal stimulator before.  He is neurovascularly intact, ambulatory.  Vitals are stable, he has no other concerning symptoms in regards to abdomen/genitourinary.  Patient has reproducible tenderness to palpation along the left lumbar musculature into the left buttocks.  Improved with Lidoderm patch.  Instructed him to follow-up with his orthopedic/spine doctor and we will send a prescription for Lidoderm patches.  Patient at this time appears safe and stable for discharge and will be treated as an outpatient.  Discharge plan and strict return to ED precautions discussed, patient verbalizes understanding and agreement.  Final Clinical Impression(s) / ED Diagnoses Final diagnoses:  Left hip pain    Rx / DC Orders ED Discharge Orders          Ordered    lidocaine (LIDODERM) 5 %  Every 24 hours        04/10/21 0851    methylPREDNISolone (MEDROL DOSEPAK) 4 MG TBPK tablet        04/10/21 0851             Narciso Stoutenburg, Alvin Critchley, DO 04/10/21 2902

## 2021-04-10 NOTE — Discharge Instructions (Addendum)
You have been seen and discharged from the emergency department.  Your x-ray imaging was normal. Use pain patches and take steroids as directed. Follow-up with your orthopedic/spine primary provider for reevaluation and further care. Take home medications as prescribed. If you have any worsening symptoms or further concerns for your health please return to an emergency department for further evaluation.

## 2021-04-23 ENCOUNTER — Other Ambulatory Visit: Payer: Self-pay | Admitting: Registered Nurse

## 2021-04-23 DIAGNOSIS — R809 Proteinuria, unspecified: Secondary | ICD-10-CM

## 2021-05-14 ENCOUNTER — Other Ambulatory Visit: Payer: Medicare Other

## 2021-06-11 ENCOUNTER — Ambulatory Visit: Payer: Medicare Other | Admitting: Orthopaedic Surgery

## 2021-06-16 ENCOUNTER — Ambulatory Visit: Payer: Medicare Other | Admitting: Orthopaedic Surgery

## 2021-11-30 ENCOUNTER — Ambulatory Visit: Payer: Medicare Other | Admitting: Orthopaedic Surgery

## 2022-01-06 ENCOUNTER — Other Ambulatory Visit: Payer: Self-pay

## 2022-01-06 ENCOUNTER — Encounter (HOSPITAL_COMMUNITY): Payer: Self-pay | Admitting: Emergency Medicine

## 2022-01-06 ENCOUNTER — Emergency Department (HOSPITAL_COMMUNITY)
Admission: EM | Admit: 2022-01-06 | Discharge: 2022-01-07 | Disposition: A | Discharge status: Home / Self Care | Payer: Medicare Other | Attending: Emergency Medicine | Admitting: Emergency Medicine

## 2022-01-06 DIAGNOSIS — I10 Essential (primary) hypertension: Secondary | ICD-10-CM | POA: Insufficient documentation

## 2022-01-06 DIAGNOSIS — Z79899 Other long term (current) drug therapy: Secondary | ICD-10-CM | POA: Insufficient documentation

## 2022-01-06 DIAGNOSIS — E119 Type 2 diabetes mellitus without complications: Secondary | ICD-10-CM | POA: Insufficient documentation

## 2022-01-06 DIAGNOSIS — S90862A Insect bite (nonvenomous), left foot, initial encounter: Secondary | ICD-10-CM | POA: Insufficient documentation

## 2022-01-06 DIAGNOSIS — W57XXXA Bitten or stung by nonvenomous insect and other nonvenomous arthropods, initial encounter: Secondary | ICD-10-CM | POA: Diagnosis not present

## 2022-01-06 DIAGNOSIS — Z7984 Long term (current) use of oral hypoglycemic drugs: Secondary | ICD-10-CM | POA: Insufficient documentation

## 2022-01-06 DIAGNOSIS — Z794 Long term (current) use of insulin: Secondary | ICD-10-CM | POA: Diagnosis not present

## 2022-01-06 NOTE — ED Provider Triage Note (Signed)
Emergency Medicine Provider Triage Evaluation Note  Keimon Basaldua , a 66 y.o. male  was evaluated in triage.  Pt complains of insect bite to left foot onset yesterday.  Notes that he saw spot on the foot which he failed.  Has been trying alcohol swabs at home for his symptoms.  Has associated swelling and redness to the foot.  Denies fever at home.  Review of Systems  Positive: As per HPI Negative:   Physical Exam  BP (!) 145/80   Pulse 77   Temp 98.3 F (36.8 C) (Oral)   Resp 16   SpO2 96%  Gen:   Awake, no distress   Resp:  Normal effort  MSK:   Moves extremities without difficulty  Other:  Erythema noted to dorsal aspect of left foot.  No punctate area noted.  Mild increased warmth noted to the area.  Pedal pulses intact.  Flexion extension of ankle intact.  Medical Decision Making  Medically screening exam initiated at 11:34 PM.  Appropriate orders placed.  Calton Harshfield was informed that the remainder of the evaluation will be completed by another provider, this initial triage assessment does not replace that evaluation, and the importance of remaining in the ED until their evaluation is complete.  Work-up initiated   Kimarion Chery A, PA-C 01/06/22 2341

## 2022-01-06 NOTE — ED Triage Notes (Signed)
Pt reported to ED with c/o possible spider bite to left foot yesterday. Pt states he saw spider on foot and killed. Left foot is has moderate to significant swelling.

## 2022-01-07 ENCOUNTER — Encounter (HOSPITAL_COMMUNITY): Payer: Self-pay | Admitting: Student

## 2022-01-07 DIAGNOSIS — S90862A Insect bite (nonvenomous), left foot, initial encounter: Secondary | ICD-10-CM | POA: Diagnosis not present

## 2022-01-07 LAB — CBC WITH DIFFERENTIAL/PLATELET
Abs Immature Granulocytes: 0.04 10*3/uL (ref 0.00–0.07)
Basophils Absolute: 0.1 10*3/uL (ref 0.0–0.1)
Basophils Relative: 1 %
Eosinophils Absolute: 0.2 10*3/uL (ref 0.0–0.5)
Eosinophils Relative: 2 %
HCT: 42.8 % (ref 39.0–52.0)
Hemoglobin: 14.5 g/dL (ref 13.0–17.0)
Immature Granulocytes: 0 %
Lymphocytes Relative: 23 %
Lymphs Abs: 2.1 10*3/uL (ref 0.7–4.0)
MCH: 32.9 pg (ref 26.0–34.0)
MCHC: 33.9 g/dL (ref 30.0–36.0)
MCV: 97.1 fL (ref 80.0–100.0)
Monocytes Absolute: 0.6 10*3/uL (ref 0.1–1.0)
Monocytes Relative: 6 %
Neutro Abs: 6.1 10*3/uL (ref 1.7–7.7)
Neutrophils Relative %: 68 %
Platelets: 253 10*3/uL (ref 150–400)
RBC: 4.41 MIL/uL (ref 4.22–5.81)
RDW: 12.3 % (ref 11.5–15.5)
WBC: 9.1 10*3/uL (ref 4.0–10.5)
nRBC: 0 % (ref 0.0–0.2)

## 2022-01-07 LAB — BASIC METABOLIC PANEL
Anion gap: 11 (ref 5–15)
BUN: 14 mg/dL (ref 8–23)
CO2: 24 mmol/L (ref 22–32)
Calcium: 10.2 mg/dL (ref 8.9–10.3)
Chloride: 105 mmol/L (ref 98–111)
Creatinine, Ser: 1.1 mg/dL (ref 0.61–1.24)
GFR, Estimated: >60 mL/min (ref >60)
Glucose, Bld: 117 mg/dL — ABNORMAL HIGH (ref 70–99)
Potassium: 3.1 mmol/L — ABNORMAL LOW (ref 3.5–5.1)
Sodium: 140 mmol/L (ref 135–145)

## 2022-01-07 MED ORDER — NAPROXEN 250 MG PO TABS
375.0000 mg | ORAL_TABLET | Freq: Once | ORAL | Status: AC
  Administered 2022-01-07: 375 mg via ORAL
  Filled 2022-01-07: qty 2

## 2022-01-07 MED ORDER — DOXYCYCLINE HYCLATE 100 MG PO CAPS: 100.0000 mg | ORAL_CAPSULE | Freq: Two times a day (BID) | ORAL | 0 refills | Status: DC

## 2022-01-07 MED ORDER — POTASSIUM CHLORIDE CRYS ER 20 MEQ PO TBCR
40.0000 meq | EXTENDED_RELEASE_TABLET | Freq: Once | ORAL | Status: AC
  Administered 2022-01-07: 40 meq via ORAL
  Filled 2022-01-07: qty 2

## 2022-01-07 MED ORDER — DICLOFENAC SODIUM 1 % EX GEL: 4.0000 g | Freq: Four times a day (QID) | CUTANEOUS | 0 refills | Status: DC

## 2022-01-07 MED ORDER — DOXYCYCLINE HYCLATE 100 MG PO TABS
100.0000 mg | ORAL_TABLET | Freq: Once | ORAL | Status: AC
  Administered 2022-01-07: 100 mg via ORAL
  Filled 2022-01-07: qty 1

## 2022-01-07 MED ORDER — POTASSIUM CHLORIDE CRYS ER 20 MEQ PO TBCR: 20.0000 meq | EXTENDED_RELEASE_TABLET | Freq: Every day | ORAL | 0 refills | Status: DC

## 2022-01-07 NOTE — ED Provider Notes (Signed)
[facility] EMERGENCY DEPARTMENT Provider Note   CSN: [id number] Arrival date & time: 01/06/22  2309     History  Chief Complaint  Patient presents with   Insect Bite    Peter Manning is a 66 y.o. male with a history of hypertension, hyperlipidemia, sleep apnea, tobacco use, and diabetes who presents to the ED with concern for insect bite to his left foot that occurred around 8PM prior to arrival. Patient reports he felt a sting sensation to his foot with subsequent onset of pain, swelling, & redness. Worse with weight bearing. No alleviating factors. Thinks there was a spider around but did not look like a concerning spider, states does not think it was a brown recluse or black widow. Denies fever, chills, vomiting, or abdominal pain. Confident that this was not a snake.   HPI     Home Medications Prior to Admission medications   Medication Sig Start Date End Date Taking? Authorizing Provider  albuterol (PROVENTIL HFA;VENTOLIN HFA) 108 (90 Base) MCG/ACT inhaler Inhale 2 puffs into the lungs every 4 (four) hours as needed for wheezing or shortness of breath. 07/19/17   Lacretia Leigh, MD  amLODipine (NORVASC) 10 MG tablet Take 10 mg by mouth daily.  08/24/16   [provider]  atorvastatin (LIPITOR) 40 MG tablet Take 40 mg by mouth at bedtime. 10/10/19   [provider]  blood glucose meter kit and supplies KIT Dispense based on patient and insurance preference. Use up to four times daily as directed. (FOR ICD-9 250.00, 250.01). 08/29/18   Charlynne Cousins, MD  diazepam (VALIUM) 5 MG tablet Take 0.5 tablets (2.5 mg total) by mouth every 8 (eight) hours as needed for anxiety (spasms). 07/26/20   Mesner, Corene Cornea, MD  diclofenac Sodium (VOLTAREN) 1 % GEL Apply 2 g topically 4 (four) times daily. 07/28/20   Tedd Sias, PA  gabapentin (NEURONTIN) 800 MG tablet Take 800 mg by mouth 3 (three) times daily. 10/11/19   [provider]  hydrOXYzine  (ATARAX/VISTARIL) 50 MG tablet Take 50 mg by mouth every 8 (eight) hours as needed for anxiety.  07/10/18   [provider]  insulin aspart (NOVOLOG PENFILL) cartridge Inject 3 Units into the skin 3 (three) times daily with meals. 08/29/18   Charlynne Cousins, MD  Insulin Detemir (LEVEMIR) 100 UNIT/ML Pen Inject 30 Units into the skin daily. 08/29/18   Charlynne Cousins, MD  Insulin Pen Needle 31G X 6 MM MISC 1 Device by Does not apply route 2 (two) times daily. 08/29/18   Charlynne Cousins, MD  lidocaine (LIDODERM) 5 % Place 1 patch onto the skin daily. Remove & Discard patch within 12 hours or as directed by MD 04/10/21   Horton, Alvin Critchley, DO  metFORMIN (GLUCOPHAGE) 500 MG tablet Take 1 tablet (500 mg total) by mouth 2 (two) times daily with a meal. 08/29/18 11/02/28  Charlynne Cousins, MD  methocarbamol (ROBAXIN) 500 MG tablet Take 1 tablet (500 mg total) by mouth 2 (two) times daily. 07/28/20   Tedd Sias, PA  methylPREDNISolone (MEDROL DOSEPAK) 4 MG TBPK tablet Take as directed 04/10/21   Horton, Kristie M, DO  naproxen (NAPROSYN) 375 MG tablet Take 1 tablet (375 mg total) by mouth 2 (two) times daily. 03/20/21   Dorie Rank, MD  oxyCODONE-acetaminophen (PERCOCET) 10-325 MG tablet Take 1 tablet by mouth 4 (four) times daily.    [provider]  SYMBICORT 160-4.5 MCG/ACT inhaler Inhale 2 puffs  into the lungs daily as needed (shortness of breath).  08/05/18   [provider]  traZODone (DESYREL) 50 MG tablet Take 50 mg by mouth at bedtime as needed for sleep.  08/21/18   [provider]  Vitamin D, Ergocalciferol, (DRISDOL) 1.25 MG (50000 UNIT) CAPS capsule Take 50,000 Units by mouth every Monday. 10/07/19   [provider]      Allergies    Patient has no known allergies.    Review of Systems   Review of Systems  Constitutional:  Negative for chills and fever.  Respiratory:  Negative for shortness of breath.   Cardiovascular:  Negative for chest  pain.  Gastrointestinal:  Negative for abdominal pain, nausea and vomiting.  Musculoskeletal:  Positive for joint swelling.  Skin:  Positive for color change.  Neurological:  Negative for weakness and numbness.  All other systems reviewed and are negative.   Physical Exam Updated Vital Signs BP 113/66 (BP Location: Right Arm)   Pulse (!) 57   Temp 97.9 F (36.6 C) (Oral)   Resp 16   Ht 5' 8" (1.727 m)   Wt 111.6 kg   SpO2 98%   BMI 37.40 kg/m  Physical Exam Vitals and nursing note reviewed.  Constitutional:      General: He is not in acute distress.    Appearance: He is well-developed. He is not ill-appearing or toxic-appearing.  HENT:     Head: Normocephalic and atraumatic.  Eyes:     General:        Right eye: No discharge.        Left eye: No discharge.     Conjunctiva/sclera: Conjunctivae normal.  Cardiovascular:     Rate and Rhythm: Normal rate and regular rhythm.     Pulses:          Dorsalis pedis pulses are 2+ on the right side and 2+ on the left side.       Posterior tibial pulses are 2+ on the right side and 2+ on the left side.  Pulmonary:     Effort: Pulmonary effort is normal. No respiratory distress.     Breath sounds: Normal breath sounds. No wheezing or rales.  Abdominal:     General: There is no distension.     Palpations: Abdomen is soft.     Tenderness: There is no abdominal tenderness.  Musculoskeletal:     Cervical back: Neck supple.     Comments: Lower extremities: patient has erythema, swelling, and tenderness to palpation over the dorsal aspect of the left foot, most prominent in the forefoot- pictured below. Mild induration @ the forefoot. No palpable fluctuance. Patient has intact AROM to bilateral ankles and all digits. Otherwise nontender. No calf tenderness.   Skin:    General: Skin is warm and dry.     Capillary Refill: Capillary refill takes less than 2 seconds.  Neurological:     Mental Status: He is alert.     Comments: Alert. Clear  speech. Sensation grossly intact to bilateral lower extremities. 5/5 strength with plantar/dorsiflexion bilaterally.   Psychiatric:        Mood and Affect: Mood normal.        Behavior: Behavior normal.      ED Results / Procedures / Treatments   Labs (all labs ordered are listed, but only abnormal results are displayed) Labs Reviewed  BASIC METABOLIC PANEL - Abnormal; Notable for the following components:      Result Value   Potassium 3.1 (*)  Glucose, Bld 117 (*)    All other components within normal limits  CBC WITH DIFFERENTIAL/PLATELET    EKG None  Radiology No results found.  Procedures Procedures    Medications Ordered in ED Medications - No data to display  ED Course/ Medical Decision Making/ A&P                           Medical Decision Making  Patient presents to the ED with complaints of left foot pain/swelling/redness after insect bite, this involves an extensive number of treatment options, and is a complaint that carries with it a high risk of complications and morbidity. Nontoxic, vitals w/ elevated BP initially, improved.   Additional history obtained:  Chart/nursing notes reviewed.   Lab Tests:  I viewed & interpreted labs including:  CBC: unremarkable.  BMP: mild hypokalemia - oral replacement.   ED Course:  No direct trauma- doubt fx/dislocation.  No visible FB/tick or deep wounds.  No fluctuance bedside US without findings of abscess Patient denies snake bite or visible concerning spider.  Suspect large localized reaction vs. Cellulitic process.   Will cover w/ abx- doxycycline, diclofenac gel for pain/swelling, PCP follow up.   I discussed results, treatment plan, need for follow-up, and return precautions with the patient. Provided opportunity for questions, patient confirmed understanding and is in agreement with plan.   This is a shared visit with supervising physician Dr. Wyvonnia Dusky who has independently evaluated patient and is in  agreement with care    Portions of this note were generated with Dragon dictation software. Dictation errors may occur despite best attempts at proofreading.   Final Clinical Impression(s) / ED Diagnoses Final diagnoses:  Insect bite of left foot, initial encounter    Rx / DC Orders ED Discharge Orders          Ordered    doxycycline (VIBRAMYCIN) 100 MG capsule  2 times daily        01/07/22 0720    diclofenac Sodium (VOLTAREN) 1 % GEL  4 times daily        01/07/22 0720    potassium chloride SA (KLOR-CON M) 20 MEQ tablet  Daily        01/07/22 0720              Amaryllis Dyke, PA-C 01/07/22 0805    Ezequiel Essex, MD 01/07/22 1552

## 2022-01-07 NOTE — ED Notes (Signed)
Provider at bedside

## 2022-01-07 NOTE — Discharge Instructions (Addendum)
You were seen in the emergency department after a suspected insect bite. Your labs showed your potassium was mildly low- please take the supplement prescribed for 3 days, include potassium rich foods in your diet and have this rechecked by primary care in 1-2 weeks.   We are sending you home with diclofenac gel to apply every 6 hours as needed for pain/swelling as well as doxycycline- an antibiotic- to cover for infection.   We have prescribed you new medication(s) today. Discuss the medications prescribed today with your pharmacist as they can have adverse effects and interactions with your other medicines including over the counter and prescribed medications. Seek medical evaluation if you start to experience new or abnormal symptoms after taking one of these medicines, seek care immediately if you start to experience difficulty breathing, feeling of your throat closing, facial swelling, or rash as these could be indications of a more serious allergic reaction  Please keep the foot elevated when possible.   Please follow up with primary care for a recheck of the area within 3 days. Return to the ER for new or worsening symptoms including but not limited to new or worsening pain, extending redness, fever, vomiting, trouble breathing, chest/abdominal pain, or any other concerns.

## 2022-01-07 NOTE — ED Notes (Signed)
Per pt yesterday there was a spider on his shoulder wiped it away and it landed on his lt foot and believes that it bit him on the top of the foot. Lt foot is swelling, red, and painful, pulse is present

## 2022-01-07 NOTE — ED Notes (Signed)
Patient verbalizes understanding of discharge instructions. Opportunity for questioning and answers were provided. Arm band removed by staff, patient discharged from ED. 

## 2022-03-16 ENCOUNTER — Encounter (HOSPITAL_COMMUNITY): Payer: Self-pay | Admitting: Pharmacy Technician

## 2022-03-16 ENCOUNTER — Emergency Department (HOSPITAL_COMMUNITY)
Admission: EM | Admit: 2022-03-16 | Discharge: 2022-03-16 | Disposition: A | Discharge status: Home / Self Care | Payer: Medicare Other | Attending: Emergency Medicine | Admitting: Emergency Medicine

## 2022-03-16 ENCOUNTER — Emergency Department (HOSPITAL_COMMUNITY): Payer: Medicare Other

## 2022-03-16 ENCOUNTER — Other Ambulatory Visit: Payer: Self-pay

## 2022-03-16 DIAGNOSIS — R319 Hematuria, unspecified: Secondary | ICD-10-CM | POA: Diagnosis not present

## 2022-03-16 DIAGNOSIS — Z79899 Other long term (current) drug therapy: Secondary | ICD-10-CM | POA: Insufficient documentation

## 2022-03-16 DIAGNOSIS — I10 Essential (primary) hypertension: Secondary | ICD-10-CM | POA: Diagnosis not present

## 2022-03-16 DIAGNOSIS — Z794 Long term (current) use of insulin: Secondary | ICD-10-CM | POA: Diagnosis not present

## 2022-03-16 DIAGNOSIS — K4090 Unilateral inguinal hernia, without obstruction or gangrene, not specified as recurrent: Secondary | ICD-10-CM | POA: Insufficient documentation

## 2022-03-16 DIAGNOSIS — R109 Unspecified abdominal pain: Secondary | ICD-10-CM | POA: Insufficient documentation

## 2022-03-16 DIAGNOSIS — Z7984 Long term (current) use of oral hypoglycemic drugs: Secondary | ICD-10-CM | POA: Insufficient documentation

## 2022-03-16 DIAGNOSIS — E119 Type 2 diabetes mellitus without complications: Secondary | ICD-10-CM | POA: Diagnosis not present

## 2022-03-16 DIAGNOSIS — M545 Low back pain, unspecified: Secondary | ICD-10-CM | POA: Diagnosis present

## 2022-03-16 LAB — URINALYSIS, ROUTINE W REFLEX MICROSCOPIC
Bacteria, UA: NONE SEEN
Bilirubin Urine: NEGATIVE
Glucose, UA: NEGATIVE mg/dL
Ketones, ur: NEGATIVE mg/dL
Leukocytes,Ua: NEGATIVE
Nitrite: NEGATIVE
Protein, ur: 100 mg/dL — AB
Specific Gravity, Urine: 1.021 (ref 1.005–1.030)
pH: 5 (ref 5.0–8.0)

## 2022-03-16 MED ORDER — HYDROCODONE-ACETAMINOPHEN 5-325 MG PO TABS: 1.0000 | ORAL_TABLET | ORAL | 0 refills | Status: AC | PRN

## 2022-03-16 MED ORDER — HYDROCODONE-ACETAMINOPHEN 5-325 MG PO TABS: 1.0000 | ORAL_TABLET | ORAL | 0 refills | Status: DC | PRN

## 2022-03-16 MED ORDER — DICLOFENAC SODIUM 75 MG PO TBEC: 75.0000 mg | DELAYED_RELEASE_TABLET | Freq: Two times a day (BID) | ORAL | 0 refills | Status: DC

## 2022-03-16 NOTE — ED Triage Notes (Signed)
Pt here with R sided back pain onset 2 days ago. Denies urinary symptoms.

## 2022-03-16 NOTE — ED Provider Notes (Signed)
[facility] DEPT Provider Note   CSN: [id number] Arrival date & time: 03/16/22  7116     History  Chief Complaint  Patient presents with   Back Pain    Peter Manning is a 66 y.o. male.  Peter Manning is a 66 year old male with past medical history significant for chronic recurrent muscle aches and neck pains, type II diabetes mellitus, and hypertension who presents to the ED with complaints of right lower back pain that onset two days ago. Patient reports the pain onset spontaneously; no known trauma, injury or strenuous activity. Since that time, his pain has been constant and he describes it as a sharp pain that begins at his lower right and occasionally radiates into his right hip and if he twists his back, the pain will move down to his legs. Patient reports that he has taken Ibuprofen and other OTC pain medications without relief. Patient denies any fevers, chills, diaphoresis, chest pain, shortness of breath, abdominal pain, nausea, vomiting, diarrhea, bowel incontinence, urinary incontinence, dysuria, hematuria, urinary decrease, numbness/tingling, or weakness to the extremities.   The history is provided by the patient. A language interpreter was used.  Back Pain Location:  Lumbar spine Quality:  Aching Pain severity:  Moderate Onset quality:  Gradual Progression:  Worsening Chronicity:  New Relieved by:  Nothing Worsened by:  Nothing Ineffective treatments:  None tried Associated symptoms: no abdominal pain, no fever, no leg pain and no numbness        Home Medications Prior to Admission medications   Medication Sig Start Date End Date Taking? Authorizing Provider  albuterol (PROVENTIL HFA;VENTOLIN HFA) 108 (90 Base) MCG/ACT inhaler Inhale 2 puffs into the lungs every 4 (four) hours as needed for wheezing or shortness of breath. 07/19/17   Lacretia Leigh, MD  amLODipine (NORVASC) 10 MG tablet Take 10 mg by mouth daily.  08/24/16   [provider]  atorvastatin (LIPITOR) 40 MG tablet Take 40 mg by mouth at bedtime. 10/10/19   [provider]  blood glucose meter kit and supplies KIT Dispense based on patient and insurance preference. Use up to four times daily as directed. (FOR ICD-9 250.00, 250.01). 08/29/18   Charlynne Cousins, MD  diazepam (VALIUM) 5 MG tablet Take 0.5 tablets (2.5 mg total) by mouth every 8 (eight) hours as needed for anxiety (spasms). 07/26/20   Mesner, Corene Cornea, MD  diclofenac (VOLTAREN) 75 MG EC tablet Take 1 tablet (75 mg total) by mouth 2 (two) times daily. 03/16/22   Fransico Meadow, PA-C  diclofenac Sodium (VOLTAREN) 1 % GEL Apply 4 g topically 4 (four) times daily. 01/07/22   Petrucelli, Samantha R, PA-C  doxycycline (VIBRAMYCIN) 100 MG capsule Take 1 capsule (100 mg total) by mouth 2 (two) times daily. 01/07/22   Petrucelli, Samantha R, PA-C  gabapentin (NEURONTIN) 800 MG tablet Take 800 mg by mouth 3 (three) times daily. 10/11/19   [provider]  HYDROcodone-acetaminophen (NORCO/VICODIN) 5-325 MG tablet Take 1 tablet by mouth every 4 (four) hours as needed for moderate pain. 03/16/22 03/16/23  Fransico Meadow, PA-C  hydrOXYzine (ATARAX/VISTARIL) 50 MG tablet Take 50 mg by mouth every 8 (eight) hours as needed for anxiety.  07/10/18   [provider]  insulin aspart (NOVOLOG PENFILL) cartridge Inject 3 Units into the skin 3 (three) times daily with meals. 08/29/18   Charlynne Cousins, MD  Insulin Detemir (LEVEMIR) 100 UNIT/ML Pen Inject 30 Units into the skin daily. 08/29/18  Charlynne Cousins, MD  Insulin Pen Needle 31G X 6 MM MISC 1 Device by Does not apply route 2 (two) times daily. 08/29/18   Charlynne Cousins, MD  lidocaine (LIDODERM) 5 % Place 1 patch onto the skin daily. Remove & Discard patch within 12 hours or as directed by MD 04/10/21   Horton, Alvin Critchley, DO  metFORMIN (GLUCOPHAGE) 500 MG tablet Take 1 tablet (500 mg total) by mouth 2 (two) times daily with a meal.  08/29/18 11/02/28  Charlynne Cousins, MD  methocarbamol (ROBAXIN) 500 MG tablet Take 1 tablet (500 mg total) by mouth 2 (two) times daily. 07/28/20   Tedd Sias, PA  methylPREDNISolone (MEDROL DOSEPAK) 4 MG TBPK tablet Take as directed 04/10/21   Horton, Kristie M, DO  naproxen (NAPROSYN) 375 MG tablet Take 1 tablet (375 mg total) by mouth 2 (two) times daily. 03/20/21   Dorie Rank, MD  oxyCODONE-acetaminophen (PERCOCET) 10-325 MG tablet Take 1 tablet by mouth 4 (four) times daily.    [provider]  potassium chloride SA (KLOR-CON M) 20 MEQ tablet Take 1 tablet (20 mEq total) by mouth daily. 01/07/22   Petrucelli, Samantha R, PA-C  SYMBICORT 160-4.5 MCG/ACT inhaler Inhale 2 puffs into the lungs daily as needed (shortness of breath).  08/05/18   [provider]  traZODone (DESYREL) 50 MG tablet Take 50 mg by mouth at bedtime as needed for sleep.  08/21/18   [provider]  Vitamin D, Ergocalciferol, (DRISDOL) 1.25 MG (50000 UNIT) CAPS capsule Take 50,000 Units by mouth every Monday. 10/07/19   [provider]      Allergies    Patient has no known allergies.    Review of Systems   Review of Systems  Constitutional:  Negative for fever.  Gastrointestinal:  Negative for abdominal pain.  Musculoskeletal:  Positive for back pain.  Neurological:  Negative for numbness.  All other systems reviewed and are negative.   Physical Exam Updated Vital Signs BP 138/74 (BP Location: Right Arm)   Pulse 88   Temp 98.5 F (36.9 C) (Oral)   Resp 18   SpO2 96%  Physical Exam Vitals and nursing note reviewed.  Constitutional:      General: He is not in acute distress.    Appearance: He is well-developed.  HENT:     Head: Normocephalic and atraumatic.  Eyes:     Conjunctiva/sclera: Conjunctivae normal.  Cardiovascular:     Rate and Rhythm: Normal rate and regular rhythm.     Heart sounds: No murmur heard. Pulmonary:     Effort: Pulmonary effort is normal. No  respiratory distress.     Breath sounds: Normal breath sounds.  Abdominal:     Palpations: Abdomen is soft.     Tenderness: There is no abdominal tenderness.     Hernia: A hernia is present.  Musculoskeletal:        General: No swelling.     Cervical back: Neck supple.     Comments: Tender right low back.  Pain with movement   Skin:    General: Skin is warm and dry.     Capillary Refill: Capillary refill takes less than 2 seconds.  Neurological:     Mental Status: He is alert.  Psychiatric:        Mood and Affect: Mood normal.     ED Results / Procedures / Treatments   Labs (all labs ordered are listed, but only abnormal results are displayed) Labs Reviewed  URINALYSIS, ROUTINE W REFLEX MICROSCOPIC - Abnormal; Notable for the following components:      Result Value   Hgb urine dipstick MODERATE (*)    Protein, ur 100 (*)    All other components within normal limits    EKG None  Radiology CT Renal Stone Study  Result Date: 03/16/2022 CLINICAL DATA:  Flank pain, kidney stone suspected EXAM: CT ABDOMEN AND PELVIS WITHOUT CONTRAST TECHNIQUE: Multidetector CT imaging of the abdomen and pelvis was performed following the standard protocol without IV contrast. RADIATION DOSE REDUCTION: This exam was performed according to the departmental dose-optimization program which includes automated exposure control, adjustment of the mA and/or kV according to patient size and/or use of iterative reconstruction technique. COMPARISON:  2019 FINDINGS: Lower chest: No acute abnormality. Hepatobiliary: Hepatic steatosis. Gallbladder is unremarkable. No biliary dilatation. Pancreas: Unremarkable. No pancreatic ductal dilatation or surrounding inflammatory changes. Spleen: Normal in size without focal abnormality. Adrenals/Urinary Tract: Adrenals are unremarkable. 8 mm nonobstructing stone at the lower pole of the left kidney. No hydronephrosis. There is a new 8 mm stone or phlebolith at the left aspect  of the pelvis possibly within the dependent bladder. There is no dilatation of the left ureter although the left ureterovesical junction is obscured by streak artifact. Partially distended bladder is otherwise unremarkable. Stomach/Bowel: Stomach is within normal limits. Bowel is normal in caliber. Normal appendix. Vascular/Lymphatic: Mild atherosclerosis.  No enlarged nodes. Reproductive: Prostate obscured by artifact. Other: No free fluid.  Fat containing umbilical hernia. Musculoskeletal: Postoperative changes of the lumbar spine with instrumentation at L4-S1. Multilevel degenerative changes of the included spine. Left total hip arthroplasty with associated streak artifact. Left psoas atrophy. IMPRESSION: 8 mm stone or phlebolith at the left aspect of the pelvis could potentially be within the dependent bladder. There is no dilatation of the left ureter. 8 mm nonobstructing stone of the left kidney. Hepatic steatosis. Electronically Signed   By: Macy Mis M.D.   On: 03/16/2022 10:22    Procedures Procedures    Medications Ordered in ED Medications - No data to display  ED Course/ Medical Decision Making/ A&P                           Medical Decision Making Pt complains of right flank pain   Amount and/or Complexity of Data Reviewed Labs: ordered. Decision-making details documented in ED Course.    Details: Ua shows blood  Radiology: ordered and independent interpretation performed. Decision-making details documented in ED Course.    Details: Ct renal  40mm stone left kidney, fat containing herni's   Risk Prescription drug management. Risk Details: Pt advised to follow up with urology for hematuria.  Pt given referral to surgery for hemia repair.             Final Clinical Impression(s) / ED Diagnoses Final diagnoses:  Acute right flank pain  Hematuria, unspecified type  Inguinal hernia without obstruction or gangrene, recurrence not specified, unspecified laterality     Rx / DC Orders ED Discharge Orders          Ordered    HYDROcodone-acetaminophen (NORCO/VICODIN) 5-325 MG tablet  Every 4 hours PRN,   Status:  Discontinued        03/16/22 1116    diclofenac (VOLTAREN) 75 MG EC tablet  2 times daily,   Status:  Discontinued        03/16/22 1116    diclofenac (VOLTAREN) 75 MG EC tablet  2 times daily        03/16/22 1118    HYDROcodone-acetaminophen (NORCO/VICODIN) 5-325 MG tablet  Every 4 hours PRN        03/16/22 1118              Fransico Meadow, [place] 03/16/22 1437    Carmin Muskrat, MD 03/16/22 1606

## 2022-03-16 NOTE — Discharge Instructions (Addendum)
Return if any problems.

## 2022-03-17 ENCOUNTER — Other Ambulatory Visit: Payer: Self-pay | Admitting: Orthopaedic Surgery

## 2022-03-25 ENCOUNTER — Ambulatory Visit: Payer: Medicare Other | Admitting: Surgery

## 2022-07-21 ENCOUNTER — Other Ambulatory Visit: Payer: Self-pay

## 2022-07-21 ENCOUNTER — Emergency Department (HOSPITAL_COMMUNITY)
Admission: EM | Admit: 2022-07-21 | Discharge: 2022-07-22 | Disposition: A | Discharge status: Home / Self Care | Payer: 59 | Attending: Emergency Medicine | Admitting: Emergency Medicine

## 2022-07-21 ENCOUNTER — Encounter (HOSPITAL_COMMUNITY): Payer: Self-pay

## 2022-07-21 ENCOUNTER — Emergency Department (HOSPITAL_COMMUNITY): Payer: 59

## 2022-07-21 DIAGNOSIS — Z794 Long term (current) use of insulin: Secondary | ICD-10-CM | POA: Diagnosis not present

## 2022-07-21 DIAGNOSIS — N200 Calculus of kidney: Secondary | ICD-10-CM

## 2022-07-21 DIAGNOSIS — R109 Unspecified abdominal pain: Secondary | ICD-10-CM | POA: Diagnosis present

## 2022-07-21 LAB — URINALYSIS, ROUTINE W REFLEX MICROSCOPIC
Bacteria, UA: NONE SEEN
Bilirubin Urine: NEGATIVE
Glucose, UA: NEGATIVE mg/dL
Ketones, ur: NEGATIVE mg/dL
Leukocytes,Ua: NEGATIVE
Nitrite: NEGATIVE
Protein, ur: 100 mg/dL — AB
Specific Gravity, Urine: 1.015 (ref 1.005–1.030)
pH: 5 (ref 5.0–8.0)

## 2022-07-21 LAB — CBC WITH DIFFERENTIAL/PLATELET
Abs Immature Granulocytes: 0.02 10*3/uL (ref 0.00–0.07)
Basophils Absolute: 0.1 10*3/uL (ref 0.0–0.1)
Basophils Relative: 1 %
Eosinophils Absolute: 0.2 10*3/uL (ref 0.0–0.5)
Eosinophils Relative: 2 %
HCT: 42 % (ref 39.0–52.0)
Hemoglobin: 14.1 g/dL (ref 13.0–17.0)
Immature Granulocytes: 0 %
Lymphocytes Relative: 23 %
Lymphs Abs: 1.6 10*3/uL (ref 0.7–4.0)
MCH: 32.5 pg (ref 26.0–34.0)
MCHC: 33.6 g/dL (ref 30.0–36.0)
MCV: 96.8 fL (ref 80.0–100.0)
Monocytes Absolute: 0.4 10*3/uL (ref 0.1–1.0)
Monocytes Relative: 6 %
Neutro Abs: 4.6 10*3/uL (ref 1.7–7.7)
Neutrophils Relative %: 68 %
Platelets: 212 10*3/uL (ref 150–400)
RBC: 4.34 MIL/uL (ref 4.22–5.81)
RDW: 12.7 % (ref 11.5–15.5)
WBC: 6.9 10*3/uL (ref 4.0–10.5)
nRBC: 0 % (ref 0.0–0.2)

## 2022-07-21 LAB — TROPONIN I (HIGH SENSITIVITY): Troponin I (High Sensitivity): 6 ng/L (ref <18)

## 2022-07-21 LAB — COMPREHENSIVE METABOLIC PANEL
ALT: 20 U/L (ref 0–44)
AST: 24 U/L (ref 15–41)
Albumin: 4.2 g/dL (ref 3.5–5.0)
Alkaline Phosphatase: 104 U/L (ref 38–126)
Anion gap: 9 (ref 5–15)
BUN: 13 mg/dL (ref 8–23)
CO2: 26 mmol/L (ref 22–32)
Calcium: 9.8 mg/dL (ref 8.9–10.3)
Chloride: 100 mmol/L (ref 98–111)
Creatinine, Ser: 1.1 mg/dL (ref 0.61–1.24)
GFR, Estimated: >60 mL/min (ref >60)
Glucose, Bld: 140 mg/dL — ABNORMAL HIGH (ref 70–99)
Potassium: 3.3 mmol/L — ABNORMAL LOW (ref 3.5–5.1)
Sodium: 135 mmol/L (ref 135–145)
Total Bilirubin: 0.7 mg/dL (ref 0.3–1.2)
Total Protein: 8 g/dL (ref 6.5–8.1)

## 2022-07-21 LAB — LIPASE, BLOOD: Lipase: 29 U/L (ref 11–51)

## 2022-07-21 LAB — I-STAT CREATININE, ED: Creatinine, Ser: 1 mg/dL (ref 0.61–1.24)

## 2022-07-21 MED ORDER — IOHEXOL 300 MG/ML  SOLN
100.0000 mL | Freq: Once | INTRAMUSCULAR | Status: AC | PRN
  Administered 2022-07-21: 100 mL via INTRAVENOUS

## 2022-07-21 NOTE — ED Triage Notes (Signed)
Vomiting started at 1130am today. After pt developed right sided CP. Pt states CP comes and goes.   Pt states he had his "pain stimulator removed in 2017 or 2018 and my side is still hurting."

## 2022-07-21 NOTE — ED Provider Notes (Signed)
[facility] DEPT Provider Note   CSN: [id number] Arrival date & time: 07/21/22  1932     History  Chief Complaint  Patient presents with   Chest Pain   Emesis   Nausea    Peter Manning is a 67 y.o. male.  Presents to the emergency department for evaluation of nausea, vomiting, left flank pain, chest pain.  Patient reports that he became ill earlier today, first felt nausea and then started vomiting.  No associated diarrhea.  He has been having intermittent pain in the left flank but reports that he has chronic back problems.  He is not experiencing any persistent chest pain but occasionally has some pain in the center of his chest over the course of the day.       Home Medications Prior to Admission medications   Medication Sig Start Date End Date Taking? Authorizing Provider  ondansetron (ZOFRAN-ODT) 4 MG disintegrating tablet '4mg'$  ODT q4 hours prn nausea/vomit 07/22/22  Yes Laurian Edrington, Gwenyth Allegra, MD  albuterol (PROVENTIL HFA;VENTOLIN HFA) 108 (90 Base) MCG/ACT inhaler Inhale 2 puffs into the lungs every 4 (four) hours as needed for wheezing or shortness of breath. 07/19/17   Lacretia Leigh, MD  amLODipine (NORVASC) 10 MG tablet Take 10 mg by mouth daily.  08/24/16   [provider]  atorvastatin (LIPITOR) 40 MG tablet Take 40 mg by mouth at bedtime. 10/10/19   [provider]  blood glucose meter kit and supplies KIT Dispense based on patient and insurance preference. Use up to four times daily as directed. (FOR ICD-9 250.00, 250.01). 08/29/18   Charlynne Cousins, MD  diazepam (VALIUM) 5 MG tablet Take 0.5 tablets (2.5 mg total) by mouth every 8 (eight) hours as needed for anxiety (spasms). 07/26/20   Mesner, Corene Cornea, MD  diclofenac (VOLTAREN) 75 MG EC tablet Take 1 tablet (75 mg total) by mouth 2 (two) times daily. 03/16/22   Fransico Meadow, PA-C  diclofenac Sodium (VOLTAREN) 1 % GEL Apply 4 g topically 4 (four) times daily. 01/07/22    Petrucelli, Samantha R, PA-C  doxycycline (VIBRAMYCIN) 100 MG capsule Take 1 capsule (100 mg total) by mouth 2 (two) times daily. 01/07/22   Petrucelli, Samantha R, PA-C  gabapentin (NEURONTIN) 800 MG tablet Take 800 mg by mouth 3 (three) times daily. 10/11/19   [provider]  HYDROcodone-acetaminophen (NORCO/VICODIN) 5-325 MG tablet Take 1 tablet by mouth every 4 (four) hours as needed for moderate pain. 03/16/22 03/16/23  Fransico Meadow, PA-C  hydrOXYzine (ATARAX/VISTARIL) 50 MG tablet Take 50 mg by mouth every 8 (eight) hours as needed for anxiety.  07/10/18   [provider]  insulin aspart (NOVOLOG PENFILL) cartridge Inject 3 Units into the skin 3 (three) times daily with meals. 08/29/18   Charlynne Cousins, MD  Insulin Detemir (LEVEMIR) 100 UNIT/ML Pen Inject 30 Units into the skin daily. 08/29/18   Charlynne Cousins, MD  Insulin Pen Needle 31G X 6 MM MISC 1 Device by Does not apply route 2 (two) times daily. 08/29/18   Charlynne Cousins, MD  lidocaine (LIDODERM) 5 % Place 1 patch onto the skin daily. Remove & Discard patch within 12 hours or as directed by MD 04/10/21   Horton, Alvin Critchley, DO  metFORMIN (GLUCOPHAGE) 500 MG tablet Take 1 tablet (500 mg total) by mouth 2 (two) times daily with a meal. 08/29/18 11/02/28  Charlynne Cousins, MD  methocarbamol (ROBAXIN) 500 MG tablet Take 1 tablet (500 mg  total) by mouth 2 (two) times daily. 07/28/20   Tedd Sias, PA  methylPREDNISolone (MEDROL DOSEPAK) 4 MG TBPK tablet Take as directed 04/10/21   Horton, Kristie M, DO  naproxen (NAPROSYN) 375 MG tablet Take 1 tablet (375 mg total) by mouth 2 (two) times daily. 03/20/21   Dorie Rank, MD  oxyCODONE-acetaminophen (PERCOCET) 10-325 MG tablet Take 1 tablet by mouth 4 (four) times daily.    [provider]  potassium chloride SA (KLOR-CON M) 20 MEQ tablet Take 1 tablet (20 mEq total) by mouth daily. 01/07/22   Petrucelli, Samantha R, PA-C  SYMBICORT 160-4.5 MCG/ACT inhaler  Inhale 2 puffs into the lungs daily as needed (shortness of breath).  08/05/18   [provider]  traZODone (DESYREL) 50 MG tablet Take 50 mg by mouth at bedtime as needed for sleep.  08/21/18   [provider]  Vitamin D, Ergocalciferol, (DRISDOL) 1.25 MG (50000 UNIT) CAPS capsule Take 50,000 Units by mouth every Monday. 10/07/19   [provider]      Allergies    Patient has no known allergies.    Review of Systems   Review of Systems  Physical Exam Updated Vital Signs BP (!) 138/90   Pulse (!) 57   Temp 97.6 F (36.4 C) (Oral)   Resp 11   Ht 5' 8.5" (1.74 m)   Wt 113.9 kg   SpO2 95%   BMI 37.61 kg/m  Physical Exam Vitals and nursing note reviewed.  Constitutional:      General: He is not in acute distress.    Appearance: He is well-developed.  HENT:     Head: Normocephalic and atraumatic.     Mouth/Throat:     Mouth: Mucous membranes are moist.  Eyes:     General: Vision grossly intact. Gaze aligned appropriately.     Extraocular Movements: Extraocular movements intact.     Conjunctiva/sclera: Conjunctivae normal.  Cardiovascular:     Rate and Rhythm: Normal rate and regular rhythm.     Pulses: Normal pulses.     Heart sounds: Normal heart sounds, S1 normal and S2 normal. No murmur heard.    No friction rub. No gallop.  Pulmonary:     Effort: Pulmonary effort is normal. No respiratory distress.     Breath sounds: Normal breath sounds.  Abdominal:     Palpations: Abdomen is soft.     Tenderness: There is no abdominal tenderness. There is no guarding or rebound.     Hernia: No hernia is present.  Musculoskeletal:        General: No swelling.     Cervical back: Full passive range of motion without pain, normal range of motion and neck supple. No pain with movement, spinous process tenderness or muscular tenderness. Normal range of motion.     Right lower leg: No edema.     Left lower leg: No edema.  Skin:    General: Skin is warm and dry.      Capillary Refill: Capillary refill takes less than 2 seconds.     Findings: No ecchymosis, erythema, lesion or wound.  Neurological:     Mental Status: He is alert and oriented to person, place, and time.     GCS: GCS eye subscore is 4. GCS verbal subscore is 5. GCS motor subscore is 6.     Cranial Nerves: Cranial nerves 2-12 are intact.     Sensory: Sensation is intact.     Motor: Motor function is intact. No  weakness or abnormal muscle tone.     Coordination: Coordination is intact.  Psychiatric:        Mood and Affect: Mood normal.        Speech: Speech normal.        Behavior: Behavior normal.     ED Results / Procedures / Treatments   Labs (all labs ordered are listed, but only abnormal results are displayed) Labs Reviewed  COMPREHENSIVE METABOLIC PANEL - Abnormal; Notable for the following components:      Result Value   Potassium 3.3 (*)    Glucose, Bld 140 (*)    All other components within normal limits  URINALYSIS, ROUTINE W REFLEX MICROSCOPIC - Abnormal; Notable for the following components:   Hgb urine dipstick SMALL (*)    Protein, ur 100 (*)    All other components within normal limits  CBC WITH DIFFERENTIAL/PLATELET  LIPASE, BLOOD  I-STAT CREATININE, ED  TROPONIN I (HIGH SENSITIVITY)  TROPONIN I (HIGH SENSITIVITY)    EKG EKG Interpretation  Date/Time:  Thursday July 21 2022 23:58:51 EST Ventricular Rate:  56 PR Interval:  162 QRS Duration: 98 QT Interval:  431 QTC Calculation: 416 R Axis:   45 Text Interpretation: Sinus rhythm Normal ECG Confirmed by Orpah Greek [id number] on 07/22/2022 12:00:47 AM  Radiology CT ABDOMEN PELVIS W CONTRAST  Result Date: 07/21/2022 CLINICAL DATA:  Bowel obstruction suspected. Right-sided back pain for 2 days. EXAM: CT ABDOMEN AND PELVIS WITH CONTRAST TECHNIQUE: Multidetector CT imaging of the abdomen and pelvis was performed using the standard protocol following bolus administration of intravenous  contrast. RADIATION DOSE REDUCTION: This exam was performed according to the departmental dose-optimization program which includes automated exposure control, adjustment of the mA and/or kV according to patient size and/or use of iterative reconstruction technique. CONTRAST:  173m OMNIPAQUE IOHEXOL 300 MG/ML  SOLN COMPARISON:  03/16/2022 FINDINGS: Lower chest: Atelectasis in the lung bases. Hepatobiliary: No focal liver abnormality is seen. No gallstones, gallbladder wall thickening, or biliary dilatation. Pancreas: Unremarkable. No pancreatic ductal dilatation or surrounding inflammatory changes. Spleen: Normal in size without focal abnormality. Adrenals/Urinary Tract: No adrenal gland nodules. Bilateral renal cysts, largest measuring 6 cm diameter. No change since prior study. No imaging follow-up is indicated. Stone in the lower pole left kidney measuring 8 mm maximal diameter. The stone appears to be larger than on the prior study. Nephrograms are symmetrical. No hydronephrosis or hydroureter. Bladder is decompressed but again demonstrated, there is a 1.1 cm diameter bladder stone in the dependent portion of the bladder. Stomach/Bowel: Stomach, small bowel, and colon are not abnormally distended. No wall thickening or inflammatory changes. Scattered colonic diverticula without evidence of acute diverticulitis. Appendix is normal. Vascular/Lymphatic: Normal caliber abdominal aorta with scattered calcification. No retroperitoneal lymphadenopathy. Soft tissue nodules are demonstrated in the left upper quadrant which could represent lymph nodes or small splenules. No change since prior study. Reproductive: Prostate gland is enlarged. Other: No free air or free fluid in the abdomen. Small periumbilical hernia containing fat. Musculoskeletal: Calcification in the iliacus muscle anterior to the left hip is likely dystrophic or possibly postoperative. A left hip arthroplasty is present. Postoperative changes with  posterior fixation of the lower lumbar spine and lower lumbar laminectomies. Degenerative changes in the spine. IMPRESSION: 1. 8 mm nonobstructing stone in the lower pole left kidney, larger than on previous study. 2. 1.1 cm bladder stone is unchanged in size. 3. Aortic atherosclerosis. 4. Small periumbilical hernia containing fat. 5. No evidence of bowel  obstruction or inflammation. Electronically Signed   By: Lucienne Capers M.D.   On: 07/21/2022 21:33    Procedures Procedures    Medications Ordered in ED Medications  iohexol (OMNIPAQUE) 300 MG/ML solution 100 mL (100 mLs Intravenous Contrast Given 07/21/22 2119)    ED Course/ Medical Decision Making/ A&P                             Medical Decision Making Risk Prescription drug management.   Patient presents to the emergency department with nausea and vomiting.  He does not endorse fever, diarrhea.  He has had some slight cramping of the abdomen and some pain radiating up into the chest.  No continuous chest pain, no shortness of breath.  Patient reports that he has a history of an umbilical hernia.  Examination reveals small umbilical hernia that is soft.  Patient with normal bowel sounds.  No focal tenderness, no signs of peritonitis.  Lab work reassuring.  Patient underwent CT scan of abdomen and pelvis which does not show any acute abnormality.  Of interest, however, patient does have an 8 mm nonobstructing stone in the left lower pole of his kidney.  He does endorse intermittent cramping pain in the left flank and this may be intermittently causing some partial obstruction that is causing pain.  Will refer to urology.  He does not require any intervention currently.  Patient not experiencing any continuous chest pain or shortness of breath.  Cardiac evaluation was performed because of the intermittent episodes of vague discomfort in the chest.  His cardiac evaluation has been negative.  This is felt to be unlikely to be cardiac in  etiology, will be appropriate for outpatient follow-up with PCP.        Final Clinical Impression(s) / ED Diagnoses Final diagnoses:  Renal stone    Rx / DC Orders ED Discharge Orders          Ordered    ondansetron (ZOFRAN-ODT) 4 MG disintegrating tablet        07/22/22 0217              Orpah Greek, MD 07/22/22 [phone]

## 2022-07-21 NOTE — ED Provider Triage Note (Signed)
Emergency Medicine Provider Triage Evaluation Note  Peter Manning , a 67 y.o. male  was evaluated in triage.  Pt complains of complaining of abdominal pain and vomiting, distention and chest pain that started today.  He states he has a hernia that seems to be "pushing out" he states the chest pain started before he started having vomiting.  Review of Systems  Positive: Pain, Vomiting, abdominal pain Negative: Of breath  Physical Exam  BP (!) 144/78 (BP Location: Left Arm)   Pulse 61   Temp 97.9 F (36.6 C) (Oral)   Resp 19   Ht 5' 8.5" (1.74 m)   Wt 113.9 kg   SpO2 95%   BMI 37.61 kg/m  Gen:   Awake, no distress, uncomfortable appearing appearing sinus rhythm on cardiac monitor Resp:  Normal effort  MSK:   Moves extremities without difficulty  Other:  Is moderately distended with mild diffuse tenderness, hernia to the i umbilicus, I was able to partially reduce it improving patient's pain  Medical Decision Making  Medically screening exam initiated at 10:37 PM.  Appropriate orders placed.  Peter Manning was informed that the remainder of the evaluation will be completed by another provider, this initial triage assessment does not replace that evaluation, and the importance of remaining in the ED until their evaluation is complete.  CT ordered due to pain and distention with only partially reducible hernia   Gwenevere Abbot, PA-C 07/21/22 2239

## 2022-07-22 DIAGNOSIS — N200 Calculus of kidney: Secondary | ICD-10-CM | POA: Diagnosis not present

## 2022-07-22 LAB — TROPONIN I (HIGH SENSITIVITY): Troponin I (High Sensitivity): 7 ng/L (ref <18)

## 2022-07-22 MED ORDER — ONDANSETRON 4 MG PO TBDP: ORAL_TABLET | ORAL | 0 refills | Status: DC

## 2022-07-29 ENCOUNTER — Other Ambulatory Visit: Payer: Self-pay

## 2022-07-29 ENCOUNTER — Emergency Department (HOSPITAL_COMMUNITY)
Admission: EM | Admit: 2022-07-29 | Discharge: 2022-07-29 | Disposition: A | Discharge status: Home / Self Care | Payer: 59 | Attending: Emergency Medicine | Admitting: Emergency Medicine

## 2022-07-29 ENCOUNTER — Emergency Department (HOSPITAL_COMMUNITY): Payer: 59

## 2022-07-29 DIAGNOSIS — M79641 Pain in right hand: Secondary | ICD-10-CM | POA: Insufficient documentation

## 2022-07-29 DIAGNOSIS — Z79899 Other long term (current) drug therapy: Secondary | ICD-10-CM | POA: Insufficient documentation

## 2022-07-29 DIAGNOSIS — I10 Essential (primary) hypertension: Secondary | ICD-10-CM | POA: Diagnosis not present

## 2022-07-29 LAB — BASIC METABOLIC PANEL
Anion gap: 10 (ref 5–15)
BUN: 16 mg/dL (ref 8–23)
CO2: 25 mmol/L (ref 22–32)
Calcium: 9.9 mg/dL (ref 8.9–10.3)
Chloride: 100 mmol/L (ref 98–111)
Creatinine, Ser: 1.07 mg/dL (ref 0.61–1.24)
GFR, Estimated: >60 mL/min (ref >60)
Glucose, Bld: 156 mg/dL — ABNORMAL HIGH (ref 70–99)
Potassium: 3.6 mmol/L (ref 3.5–5.1)
Sodium: 135 mmol/L (ref 135–145)

## 2022-07-29 LAB — CBC WITH DIFFERENTIAL/PLATELET
Abs Immature Granulocytes: 0.03 10*3/uL (ref 0.00–0.07)
Basophils Absolute: 0.1 10*3/uL (ref 0.0–0.1)
Basophils Relative: 1 %
Eosinophils Absolute: 0.2 10*3/uL (ref 0.0–0.5)
Eosinophils Relative: 2 %
HCT: 44.7 % (ref 39.0–52.0)
Hemoglobin: 14.7 g/dL (ref 13.0–17.0)
Immature Granulocytes: 0 %
Lymphocytes Relative: 20 %
Lymphs Abs: 1.7 10*3/uL (ref 0.7–4.0)
MCH: 31.7 pg (ref 26.0–34.0)
MCHC: 32.9 g/dL (ref 30.0–36.0)
MCV: 96.3 fL (ref 80.0–100.0)
Monocytes Absolute: 0.5 10*3/uL (ref 0.1–1.0)
Monocytes Relative: 6 %
Neutro Abs: 6.1 10*3/uL (ref 1.7–7.7)
Neutrophils Relative %: 71 %
Platelets: 239 10*3/uL (ref 150–400)
RBC: 4.64 MIL/uL (ref 4.22–5.81)
RDW: 12.6 % (ref 11.5–15.5)
WBC: 8.6 10*3/uL (ref 4.0–10.5)
nRBC: 0 % (ref 0.0–0.2)

## 2022-07-29 MED ORDER — HYDROCODONE-ACETAMINOPHEN 5-325 MG PO TABS
1.0000 | ORAL_TABLET | Freq: Once | ORAL | Status: AC
  Administered 2022-07-29: 1 via ORAL
  Filled 2022-07-29: qty 1

## 2022-07-29 NOTE — ED Triage Notes (Signed)
Patient reports right hand pain that has gotten worse since last night, is warm, swollen and tender to touch. Denies injury, falls, trauma or cuts. Reports hx of HTN, DM and HLD. No hx of gout.

## 2022-07-29 NOTE — ED Provider Triage Note (Signed)
Emergency Medicine Provider Triage Evaluation Note  Peter Manning , a 67 y.o. male  was evaluated in triage.  Pt complains of right hand pain that has gotten worse since last night.  She states it is warm, swollen and tender to the touch.  Denies injury, falls, trauma, cuts, fever.  States the swelling has been worsening since yesterday.  He has not taken any medications at home for symptoms.  No hx of gout.   Review of Systems  Positive: As above Negative: As above  Physical Exam  BP 139/80   Pulse 80   Temp 98 F (36.7 C)   Resp 15   SpO2 98%  Gen:   Awake, no distress   Resp:  Normal effort  MSK:   Moves extremities without difficulty  Other:  Right hand swollen on the dorsal aspect into the thumb and index finger; sensation is intact; mild erythema and warmth   Medical Decision Making  Medically screening exam initiated at 4:15 PM.  Appropriate orders placed.  Peter Manning was informed that the remainder of the evaluation will be completed by another provider, this initial triage assessment does not replace that evaluation, and the importance of remaining in the ED until their evaluation is complete.     Theressa Stamps R, [place] 07/29/22 1620

## 2022-07-29 NOTE — Discharge Instructions (Signed)
Please return to the ED with any new symptoms such as fevers, inability to flex or extend fingers, redness to your hand Please follow-up with hand doctor.  Please call Monday and make an appointment to be seen. Please read attached guide concerning hand pain Please take ibuprofen/Tylenol for pain.  Please follow directions on bottle.
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
## 2022-10-18 ENCOUNTER — Ambulatory Visit: Payer: Self-pay | Admitting: Surgery

## 2022-10-18 NOTE — H&P (Signed)
Subjective    Chief Complaint: Umbilical Hernia       History of Present Illness: Peter Manning is a 67 y.o. male who is seen today as an office consultation at the request of Dr. Allena Katz for evaluation of Umbilical Hernia .   This is a 67 year old male with chronic back pain status post lumbar fusion and diabetes type 2 who presents with a 2-year history of a slowly enlarging umbilical hernia.  The hernia occasionally is uncomfortable.  He has not tried to reduce it.  He denies any difficulty with bowel movements.  No imaging.  He presents now to discuss repair.   Review of Systems: A complete review of systems was obtained from the patient.  I have reviewed this information and discussed as appropriate with the patient.  See HPI as well for other ROS.   Review of Systems  Constitutional: Negative.   HENT: Negative.    Eyes: Negative.   Respiratory: Negative.    Cardiovascular: Negative.   Gastrointestinal:  Positive for abdominal pain.  Genitourinary: Negative.   Musculoskeletal: Negative.   Skin: Negative.   Neurological: Negative.   Endo/Heme/Allergies: Negative.   Psychiatric/Behavioral: Negative.          Medical History: Past Medical History      Past Medical History:  Diagnosis Date   Arthritis     Diabetes mellitus without complication (CMS/HHS-HCC)     Hyperlipidemia     Hypertension     Sleep apnea          Problem List     Patient Active Problem List  Diagnosis   AKI (acute kidney injury) (CMS-HCC)   Chronic bronchitis (CMS/HHS-HCC)   Hyperosmolar hyperglycemic coma due to diabetes mellitus without ketoacidosis (CMS/HHS-HCC)   Lumbar foraminal stenosis   Right knee DJD   S/P lumbar fusion        Past Surgical History       Past Surgical History:  Procedure Laterality Date   back surgery       JOINT REPLACEMENT Left      hip   2017 and 2020   knee surgery Right      2022        Allergies  No Known Allergies     Medications Ordered  Prior to Encounter        Current Outpatient Medications on File Prior to Visit  Medication Sig Dispense Refill   atorvastatin (LIPITOR) 40 MG tablet         gabapentin (NEURONTIN) 800 MG tablet Take 800 mg by mouth 3 (three) times daily       metFORMIN (GLUCOPHAGE) 500 MG tablet Take by mouth       oxyCODONE-acetaminophen (PERCOCET) 10-325 mg tablet TAKE 1 TABLET BY MOUTH 6 TIMES A DAY AS NEEDED FOR PAIN       amLODIPine (NORVASC) 10 MG tablet Take 10 mg by mouth once daily        No current facility-administered medications on file prior to visit.        Family History       Family History  Problem Relation Age of Onset   Coronary Artery Disease (Blocked arteries around heart) Father     Diabetes Sister     Breast cancer Sister          Tobacco Use History  Social History        Tobacco Use  Smoking Status Every Day   Current packs/day: 0.50   Types: Cigarettes  Smokeless Tobacco Not on file        Social History  Social History         Socioeconomic History   Marital status: Single  Tobacco Use   Smoking status: Every Day      Current packs/day: 0.50      Types: Cigarettes  Vaping Use   Vaping status: Never Used  Substance and Sexual Activity   Alcohol use: Never   Drug use: Never    Social Determinants of Health      Received from [facility], [facility]    Social Network        Objective:         Vitals:    10/18/22 1328  BP: 122/82  Pulse: 71  Temp: 36.8 C (98.3 F)  SpO2: 99%  Weight: (!) 109.6 kg (241 lb 9.6 oz)  Height: 172.7 cm ( )  PainSc: 0-No pain    Body mass index is 36.74 kg/m.   Physical Exam    Constitutional:  WDWN in NAD, conversant, no obvious deformities; lying in bed comfortably Eyes:  Pupils equal, round; sclera anicteric; moist conjunctiva; no lid lag HENT:  Oral mucosa moist; good dentition  Neck:  No masses palpated, trachea midline; no thyromegaly Lungs:  CTA bilaterally; normal respiratory  effort CV:  Regular rate and rhythm; no murmurs; extremities well-perfused with no edema Abd:  +bowel sounds, soft, non-tender, no palpable organomegaly; protruding umbilical hernia measuring 3 cm in diameter.  The fascial defect is estimated at 2 cm.  The hernia is partially reducible. Musc: Normal gait; no apparent clubbing or cyanosis in extremities Lymphatic:  No palpable cervical or axillary lymphadenopathy Skin:  Warm, dry; no sign of jaundice Psychiatric - alert and oriented x 4; calm mood and affect       Assessment and Plan:  Diagnoses and all orders for this visit:   Umbilical hernia without obstruction or gangrene       Recommend umbilical hernia repair with mesh.The surgical procedure has been discussed with the patient.  Potential risks, benefits, alternative treatments, and expected outcomes have been explained.  All of the patient's questions at this time have been answered.  The likelihood of reaching the patient's treatment goal is good.  The patient understand the proposed surgical procedure and wishes to proceed.    Wilmon Arms. Corliss Skains, MD, [facility]  General Surgery   10/18/2022 1:40 PM

## 2022-11-03 ENCOUNTER — Other Ambulatory Visit: Payer: Self-pay | Admitting: Surgery

## 2022-12-27 ENCOUNTER — Other Ambulatory Visit (INDEPENDENT_AMBULATORY_CARE_PROVIDER_SITE_OTHER): Payer: 59

## 2022-12-27 ENCOUNTER — Ambulatory Visit (INDEPENDENT_AMBULATORY_CARE_PROVIDER_SITE_OTHER): Payer: 59 | Admitting: Orthopaedic Surgery

## 2022-12-27 ENCOUNTER — Encounter: Payer: Self-pay | Admitting: Orthopaedic Surgery

## 2022-12-27 VITALS — BP 110/74 | HR 87 | Ht 68.5 in | Wt 235.0 lb

## 2022-12-27 DIAGNOSIS — M25551 Pain in right hip: Secondary | ICD-10-CM | POA: Diagnosis not present

## 2022-12-27 DIAGNOSIS — G8929 Other chronic pain: Secondary | ICD-10-CM

## 2022-12-27 DIAGNOSIS — M545 Low back pain, unspecified: Secondary | ICD-10-CM

## 2022-12-27 NOTE — Progress Notes (Signed)
Office Visit Note   Patient: Peter Manning           Date of Birth: [birth year]           MRN: [id number] Visit Date: 12/27/2022              Requested by: No referring provider defined for this encounter. PCP: Pcp, No   Assessment & Plan: Visit Diagnoses:  1. Chronic right-sided low back pain, unspecified whether sciatica present   2. Pain in right hip     Plan: Patient's right hip OA has not progressed and currently does not have any pain with internal rotation.  Lumbar fusion looks solid.  Recheck 6 months.  Follow-Up Instructions: Return in about 6 months (around 06/28/2023), or x xxxx.   Orders:  Orders Placed This Encounter  Procedures   XR Lumbar Spine 2-3 Views   XR HIP UNILAT W OR W/O PELVIS 2-3 VIEWS RIGHT   No orders of the defined types were placed in this encounter.     Procedures: No procedures performed   Clinical Data: No additional findings.   Subjective: Chief Complaint  Patient presents with   Right Leg - Pain    HPI patient returns he is still in pain management on Percocet 10/325 and is having some pain around his right hip radiates towards his knee is worse if he is on it a lot.  Previous left total of arthroplasty years ago after 10+ years he had an eccentric poly wear and had a revision with new poly placed still doing well on current x-rays.  Previous lumbar fusion L4-S1 solid fusion.  He did not have significant changes at L3-4 on previous imaging studies many years ago.  Right total knee arthroplasty still doing well.  Recent surgery for umbilical hernia.  Review of Systems updated unchanged   Objective: Vital Signs: BP 110/74   Pulse 87   Ht 5' 8.5" (1.74 m)   Wt 235 lb (106.6 kg)   BMI 35.21 kg/m   Physical Exam Constitutional:      Appearance: He is well-developed.  HENT:     Head: Normocephalic and atraumatic.     Right Ear: External ear normal.     Left Ear: External ear normal.  Eyes:     Pupils: Pupils are equal,  round, and reactive to light.  Neck:     Thyroid: No thyromegaly.     Trachea: No tracheal deviation.  Cardiovascular:     Rate and Rhythm: Normal rate.  Pulmonary:     Effort: Pulmonary effort is normal.     Breath sounds: No wheezing.  Abdominal:     General: Bowel sounds are normal.     Palpations: Abdomen is soft.  Musculoskeletal:     Cervical back: Neck supple.  Skin:    General: Skin is warm and dry.     Capillary Refill: Capillary refill takes less than 2 seconds.  Neurological:     Mental Status: He is alert and oriented to person, place, and time.  Psychiatric:        Behavior: Behavior normal.        Thought Content: Thought content normal.        Judgment: Judgment normal.     Ortho Exam no pain with internal/external rotation right hip.  Negative straight leg raising 90 degrees.  Well-healed lumbar incision.  Left hip range of motion is normal knees show full extension good quad strength no knee effusion good collateral balance.  Specialty Comments:  No specialty comments available.  Imaging: XR HIP UNILAT W OR W/O PELVIS 2-3 VIEWS RIGHT  Result Date: 12/27/2022 Send AP pelvis frog-leg right hip obtained and reviewed.  This shows slight joint space narrowing unchanged from 2022 images.  Satisfactory left total hip arthroplasty noted without eccentric wear. Impression: Mild right hip OA, satisfactory left total of arthroplasty.  XR Lumbar Spine 2-3 Views  Result Date: 12/27/2022 2 view AP lateral lumbar x-rays obtained and reviewed.  Two-level lumbar fusion L4-S1 with interbody consolidation solid fusion with hardware.  No adjacent level narrowing noted. Impression: Satisfactory L4-S1 fusion.  Comparison to 2022 images shows no adjacent level progression.    PMFS History: Patient Active Problem List   Diagnosis Date Noted   Hyperosmolar hyperglycemic coma due to diabetes mellitus without ketoacidosis (HCC) 08/26/2018   AKI (acute kidney injury) (HCC)  08/26/2018   Hyperkalemia 08/26/2018   Spinal stenosis of cervical region 04/20/2018   Foraminal stenosis of cervical region 04/20/2018   Chronic left shoulder pain 01/08/2018   Lumbar foraminal stenosis 01/08/2018   S/P lumbar fusion 05/03/2017   Impingement syndrome of left shoulder 05/03/2017   S/P total knee arthroplasty, right 12/28/2016   Right knee DJD 10/10/2016   S/P revision of total hip 11/18/2015   OSA (obstructive sleep apnea) 12/09/2011   Chronic bronchitis (HCC) 12/09/2011   Tobacco abuse 12/09/2011   Past Medical History:  Diagnosis Date   Bronchitis    Depression    Hepatitis 1976   pt. doesn't remember if A, B, C?   History of kidney stones    Hyperlipidemia    Hypertension    OSA (obstructive sleep apnea) 12/09/2011   PSG 01/03/12>>AHI 45.7, SpO2 low 73%, PLMI 0.  CPAP 15 cm H2O>>AHI 0, +R.    Pneumonia 2015   Renal disorder    had kidney stone   Sleep apnea    no CPAP ordered per patient/over 3 years    Family History  Problem Relation Age of Onset   Heart attack Father    Heart disease Father    Cancer Mother    Diabetes Sister    Diabetes Brother    Colon cancer Neg Hx    Esophageal cancer Neg Hx    Rectal cancer Neg Hx    Stomach cancer Neg Hx     Past Surgical History:  Procedure Laterality Date   BACK SURGERY  2001   put in screws per pt.   COLONOSCOPY     CYSTOSCOPY WITH RETROGRADE PYELOGRAM, URETEROSCOPY AND STENT PLACEMENT Left 07/06/2017   Procedure: CYSTOSCOPY WITH LEFT RETROGRADE PYELOGRAM, URETEROSCOPY WITH HOLMIUM LASER BASKET EXTRACTION AND STENT PLACEMENT;  Surgeon: Bjorn Pippin, MD;  Location: [facility];  Service: Urology;  Laterality: Left;   JOINT REPLACEMENT     REVISION TOTAL HIP ARTHROPLASTY Left 11/2015   TOTAL HIP ARTHROPLASTY     left hip   TOTAL HIP REVISION Left 11/18/2015   Procedure: LEFT TOTAL HIP REVISION;  Surgeon: Eldred Manges, MD;  Location: [facility];  Service: Orthopedics;  Laterality: Left;   TOTAL  KNEE ARTHROPLASTY Right 10/10/2016   Procedure: TOTAL KNEE ARTHROPLASTY;  Surgeon: Eldred Manges, MD;  Location: [facility] OR;  Service: Orthopedics;  Laterality: Right;   Social History   Occupational History   Not on file  Tobacco Use   Smoking status: Some Days    Packs/day: 0.50    Years: 46.00    Additional pack years: 0.00  Total pack years: 23.00    Types: Cigarettes   Smokeless tobacco: Never   Tobacco comments:    states on and off smoker  Vaping Use   Vaping Use: Never used  Substance and Sexual Activity   Alcohol use: No   Drug use: No   Sexual activity: Not on file

## 2023-06-30 ENCOUNTER — Ambulatory Visit: Payer: 59 | Admitting: Orthopaedic Surgery

## 2023-08-09 ENCOUNTER — Encounter (HOSPITAL_COMMUNITY): Payer: Self-pay | Admitting: Emergency Medicine

## 2023-08-09 ENCOUNTER — Emergency Department (HOSPITAL_COMMUNITY): Payer: 59

## 2023-08-09 ENCOUNTER — Other Ambulatory Visit: Payer: Self-pay

## 2023-08-09 ENCOUNTER — Inpatient Hospital Stay (HOSPITAL_COMMUNITY)
Admission: EM | Admit: 2023-08-09 | Discharge: 2023-08-14 | DRG: 638 | Disposition: A | Discharge status: Home / Self Care | Payer: 59 | Attending: Obstetrics and Gynecology | Admitting: Obstetrics and Gynecology

## 2023-08-09 DIAGNOSIS — T383X6A Underdosing of insulin and oral hypoglycemic [antidiabetic] drugs, initial encounter: Secondary | ICD-10-CM | POA: Diagnosis present

## 2023-08-09 DIAGNOSIS — Z6835 Body mass index (BMI) 35.0-35.9, adult: Secondary | ICD-10-CM

## 2023-08-09 DIAGNOSIS — Z96642 Presence of left artificial hip joint: Secondary | ICD-10-CM | POA: Diagnosis present

## 2023-08-09 DIAGNOSIS — G4733 Obstructive sleep apnea (adult) (pediatric): Secondary | ICD-10-CM | POA: Diagnosis present

## 2023-08-09 DIAGNOSIS — Z9682 Presence of neurostimulator: Secondary | ICD-10-CM

## 2023-08-09 DIAGNOSIS — Z79899 Other long term (current) drug therapy: Secondary | ICD-10-CM

## 2023-08-09 DIAGNOSIS — Z8249 Family history of ischemic heart disease and other diseases of the circulatory system: Secondary | ICD-10-CM

## 2023-08-09 DIAGNOSIS — Z1152 Encounter for screening for COVID-19: Secondary | ICD-10-CM

## 2023-08-09 DIAGNOSIS — Z794 Long term (current) use of insulin: Secondary | ICD-10-CM

## 2023-08-09 DIAGNOSIS — N179 Acute kidney failure, unspecified: Secondary | ICD-10-CM | POA: Diagnosis present

## 2023-08-09 DIAGNOSIS — Z7984 Long term (current) use of oral hypoglycemic drugs: Secondary | ICD-10-CM

## 2023-08-09 DIAGNOSIS — F32A Depression, unspecified: Secondary | ICD-10-CM | POA: Diagnosis present

## 2023-08-09 DIAGNOSIS — N400 Enlarged prostate without lower urinary tract symptoms: Secondary | ICD-10-CM | POA: Diagnosis present

## 2023-08-09 DIAGNOSIS — E785 Hyperlipidemia, unspecified: Secondary | ICD-10-CM | POA: Diagnosis present

## 2023-08-09 DIAGNOSIS — G894 Chronic pain syndrome: Secondary | ICD-10-CM | POA: Diagnosis present

## 2023-08-09 DIAGNOSIS — N485 Ulcer of penis: Secondary | ICD-10-CM | POA: Diagnosis present

## 2023-08-09 DIAGNOSIS — E1100 Type 2 diabetes mellitus with hyperosmolarity without nonketotic hyperglycemic-hyperosmolar coma (NKHHC): Secondary | ICD-10-CM | POA: Diagnosis not present

## 2023-08-09 DIAGNOSIS — E669 Obesity, unspecified: Secondary | ICD-10-CM | POA: Insufficient documentation

## 2023-08-09 DIAGNOSIS — J449 Chronic obstructive pulmonary disease, unspecified: Secondary | ICD-10-CM | POA: Diagnosis present

## 2023-08-09 DIAGNOSIS — F419 Anxiety disorder, unspecified: Secondary | ICD-10-CM | POA: Diagnosis present

## 2023-08-09 DIAGNOSIS — Z7951 Long term (current) use of inhaled steroids: Secondary | ICD-10-CM

## 2023-08-09 DIAGNOSIS — Z833 Family history of diabetes mellitus: Secondary | ICD-10-CM

## 2023-08-09 DIAGNOSIS — B3742 Candidal balanitis: Secondary | ICD-10-CM | POA: Diagnosis present

## 2023-08-09 DIAGNOSIS — Z91148 Patient's other noncompliance with medication regimen for other reason: Secondary | ICD-10-CM

## 2023-08-09 DIAGNOSIS — E876 Hypokalemia: Secondary | ICD-10-CM | POA: Diagnosis present

## 2023-08-09 DIAGNOSIS — B370 Candidal stomatitis: Secondary | ICD-10-CM | POA: Diagnosis present

## 2023-08-09 DIAGNOSIS — I10 Essential (primary) hypertension: Secondary | ICD-10-CM | POA: Diagnosis present

## 2023-08-09 DIAGNOSIS — Z96651 Presence of right artificial knee joint: Secondary | ICD-10-CM | POA: Diagnosis present

## 2023-08-09 DIAGNOSIS — F17210 Nicotine dependence, cigarettes, uncomplicated: Secondary | ICD-10-CM | POA: Diagnosis present

## 2023-08-09 LAB — URINALYSIS, ROUTINE W REFLEX MICROSCOPIC
Bacteria, UA: NONE SEEN
Bilirubin Urine: NEGATIVE
Glucose, UA: >=500 mg/dL — AB
Ketones, ur: NEGATIVE mg/dL
Leukocytes,Ua: NEGATIVE
Nitrite: NEGATIVE
Protein, ur: NEGATIVE mg/dL
Specific Gravity, Urine: 1.026 (ref 1.005–1.030)
pH: 5 (ref 5.0–8.0)

## 2023-08-09 LAB — CBC WITH DIFFERENTIAL/PLATELET
Abs Immature Granulocytes: 0.06 10*3/uL (ref 0.00–0.07)
Basophils Absolute: 0.1 10*3/uL (ref 0.0–0.1)
Basophils Relative: 1 %
Eosinophils Absolute: 0.1 10*3/uL (ref 0.0–0.5)
Eosinophils Relative: 1 %
HCT: 50.2 % (ref 39.0–52.0)
Hemoglobin: 17 g/dL (ref 13.0–17.0)
Immature Granulocytes: 1 %
Lymphocytes Relative: 16 %
Lymphs Abs: 1.7 10*3/uL (ref 0.7–4.0)
MCH: 32.2 pg (ref 26.0–34.0)
MCHC: 33.9 g/dL (ref 30.0–36.0)
MCV: 95.1 fL (ref 80.0–100.0)
Monocytes Absolute: 0.5 10*3/uL (ref 0.1–1.0)
Monocytes Relative: 5 %
Neutro Abs: 8 10*3/uL — ABNORMAL HIGH (ref 1.7–7.7)
Neutrophils Relative %: 76 %
Platelets: 228 10*3/uL (ref 150–400)
RBC: 5.28 MIL/uL (ref 4.22–5.81)
RDW: 11.9 % (ref 11.5–15.5)
WBC: 10.5 10*3/uL (ref 4.0–10.5)
nRBC: 0 % (ref 0.0–0.2)

## 2023-08-09 LAB — CBG MONITORING, ED
Glucose-Capillary: >600 mg/dL (ref 70–99)
Glucose-Capillary: >600 mg/dL (ref 70–99)

## 2023-08-09 NOTE — ED Triage Notes (Signed)
 Pt reports urinary frequency and has noticed a "sore" on his penis.  Pt also reports chronic dry mouth and cough.  He reports he has given up smoking.  Cough is productive and sometimes blood tinged. No fever, chills, shob.

## 2023-08-09 NOTE — ED Provider Triage Note (Signed)
 Emergency Medicine Provider Triage Evaluation Note  Peter Manning , a 68 y.o. male  was evaluated in triage.  Pt complains of multiple complaints including persistent cough x 1 month, increased urinary frequency, penile ulcer and mouth complaint x 3 days.  Patient is sexually active denies any penile discharge.  Patient is diabetic on insulin  however ran out of his insulin  a month ago.  Review of Systems  Positive:  Negative:   Physical Exam  BP (!) 143/72 (BP Location: Left Arm)   Pulse 96   Temp 97.6 F (36.4 C) (Oral)   Resp 20   Ht 5' 8 (1.727 m)   Wt 106.6 kg   SpO2 96%   BMI 35.73 kg/m  Gen:   Awake, no distress   Resp:  Normal effort, lung sounds are coarse, right upper Rales cleared following coughing MSK:   Moves extremities without difficulty  Other:  Tongue is covered in copious exudate.Multiple small shallow penile ulcerations, tender to palpation  Medical Decision Making  Medically screening exam initiated at 8:50 PM.  Appropriate orders placed.  Peter Manning was informed that the remainder of the evaluation will be completed by another provider, this initial triage assessment does not replace that evaluation, and the importance of remaining in the ED until their evaluation is complete.     Donnajean Lynwood DEL, PA-C 08/09/23 2053

## 2023-08-10 ENCOUNTER — Other Ambulatory Visit: Payer: Self-pay

## 2023-08-10 DIAGNOSIS — I10 Essential (primary) hypertension: Secondary | ICD-10-CM | POA: Diagnosis present

## 2023-08-10 DIAGNOSIS — J441 Chronic obstructive pulmonary disease with (acute) exacerbation: Secondary | ICD-10-CM

## 2023-08-10 DIAGNOSIS — Z96651 Presence of right artificial knee joint: Secondary | ICD-10-CM | POA: Diagnosis present

## 2023-08-10 DIAGNOSIS — F419 Anxiety disorder, unspecified: Secondary | ICD-10-CM | POA: Diagnosis present

## 2023-08-10 DIAGNOSIS — Z9682 Presence of neurostimulator: Secondary | ICD-10-CM | POA: Diagnosis not present

## 2023-08-10 DIAGNOSIS — B3742 Candidal balanitis: Secondary | ICD-10-CM | POA: Diagnosis present

## 2023-08-10 DIAGNOSIS — E876 Hypokalemia: Secondary | ICD-10-CM | POA: Diagnosis present

## 2023-08-10 DIAGNOSIS — B370 Candidal stomatitis: Secondary | ICD-10-CM | POA: Diagnosis present

## 2023-08-10 DIAGNOSIS — Z8249 Family history of ischemic heart disease and other diseases of the circulatory system: Secondary | ICD-10-CM | POA: Diagnosis not present

## 2023-08-10 DIAGNOSIS — Z7951 Long term (current) use of inhaled steroids: Secondary | ICD-10-CM | POA: Diagnosis not present

## 2023-08-10 DIAGNOSIS — J449 Chronic obstructive pulmonary disease, unspecified: Secondary | ICD-10-CM | POA: Diagnosis present

## 2023-08-10 DIAGNOSIS — Z794 Long term (current) use of insulin: Secondary | ICD-10-CM | POA: Diagnosis not present

## 2023-08-10 DIAGNOSIS — N485 Ulcer of penis: Secondary | ICD-10-CM | POA: Diagnosis present

## 2023-08-10 DIAGNOSIS — Z7984 Long term (current) use of oral hypoglycemic drugs: Secondary | ICD-10-CM | POA: Diagnosis not present

## 2023-08-10 DIAGNOSIS — N179 Acute kidney failure, unspecified: Secondary | ICD-10-CM | POA: Diagnosis present

## 2023-08-10 DIAGNOSIS — E785 Hyperlipidemia, unspecified: Secondary | ICD-10-CM | POA: Diagnosis present

## 2023-08-10 DIAGNOSIS — G894 Chronic pain syndrome: Secondary | ICD-10-CM | POA: Diagnosis present

## 2023-08-10 DIAGNOSIS — F17210 Nicotine dependence, cigarettes, uncomplicated: Secondary | ICD-10-CM | POA: Diagnosis present

## 2023-08-10 DIAGNOSIS — E7849 Other hyperlipidemia: Secondary | ICD-10-CM | POA: Diagnosis not present

## 2023-08-10 DIAGNOSIS — Z720 Tobacco use: Secondary | ICD-10-CM

## 2023-08-10 DIAGNOSIS — Z1152 Encounter for screening for COVID-19: Secondary | ICD-10-CM | POA: Diagnosis not present

## 2023-08-10 DIAGNOSIS — N400 Enlarged prostate without lower urinary tract symptoms: Secondary | ICD-10-CM | POA: Diagnosis present

## 2023-08-10 DIAGNOSIS — F32A Depression, unspecified: Secondary | ICD-10-CM | POA: Diagnosis present

## 2023-08-10 DIAGNOSIS — G4733 Obstructive sleep apnea (adult) (pediatric): Secondary | ICD-10-CM | POA: Diagnosis present

## 2023-08-10 DIAGNOSIS — E669 Obesity, unspecified: Secondary | ICD-10-CM | POA: Diagnosis present

## 2023-08-10 DIAGNOSIS — E1100 Type 2 diabetes mellitus with hyperosmolarity without nonketotic hyperglycemic-hyperosmolar coma (NKHHC): Secondary | ICD-10-CM | POA: Diagnosis present

## 2023-08-10 DIAGNOSIS — Z79899 Other long term (current) drug therapy: Secondary | ICD-10-CM | POA: Diagnosis not present

## 2023-08-10 LAB — GLUCOSE, CAPILLARY
Glucose-Capillary: 403 mg/dL — ABNORMAL HIGH (ref 70–99)
Glucose-Capillary: 505 mg/dL (ref 70–99)
Glucose-Capillary: 334 mg/dL — ABNORMAL HIGH (ref 70–99)
Glucose-Capillary: 274 mg/dL — ABNORMAL HIGH (ref 70–99)
Glucose-Capillary: 227 mg/dL — ABNORMAL HIGH (ref 70–99)
Glucose-Capillary: 249 mg/dL — ABNORMAL HIGH (ref 70–99)
Glucose-Capillary: 222 mg/dL — ABNORMAL HIGH (ref 70–99)
Glucose-Capillary: 447 mg/dL — ABNORMAL HIGH (ref 70–99)
Glucose-Capillary: 292 mg/dL — ABNORMAL HIGH (ref 70–99)
Glucose-Capillary: 259 mg/dL — ABNORMAL HIGH (ref 70–99)
Glucose-Capillary: 232 mg/dL — ABNORMAL HIGH (ref 70–99)
Glucose-Capillary: 286 mg/dL — ABNORMAL HIGH (ref 70–99)
Glucose-Capillary: 329 mg/dL — ABNORMAL HIGH (ref 70–99)
Glucose-Capillary: 397 mg/dL — ABNORMAL HIGH (ref 70–99)
Glucose-Capillary: 393 mg/dL — ABNORMAL HIGH (ref 70–99)
Glucose-Capillary: 306 mg/dL — ABNORMAL HIGH (ref 70–99)

## 2023-08-10 LAB — BASIC METABOLIC PANEL
Anion gap: 13 (ref 5–15)
Anion gap: 9 (ref 5–15)
BUN: 25 mg/dL — ABNORMAL HIGH (ref 8–23)
BUN: 17 mg/dL (ref 8–23)
CO2: 21 mmol/L — ABNORMAL LOW (ref 22–32)
CO2: 26 mmol/L (ref 22–32)
Calcium: 9.9 mg/dL (ref 8.9–10.3)
Calcium: 10.2 mg/dL (ref 8.9–10.3)
Chloride: 86 mmol/L — ABNORMAL LOW (ref 98–111)
Chloride: 100 mmol/L (ref 98–111)
Creatinine, Ser: 1.48 mg/dL — ABNORMAL HIGH (ref 0.61–1.24)
Creatinine, Ser: 0.98 mg/dL (ref 0.61–1.24)
GFR, Estimated: 52 mL/min — ABNORMAL LOW (ref >60)
GFR, Estimated: >60 mL/min (ref >60)
Glucose, Bld: 909 mg/dL (ref 70–99)
Glucose, Bld: 223 mg/dL — ABNORMAL HIGH (ref 70–99)
Potassium: 4.7 mmol/L (ref 3.5–5.1)
Potassium: 3.4 mmol/L — ABNORMAL LOW (ref 3.5–5.1)
Sodium: 120 mmol/L — ABNORMAL LOW (ref 135–145)
Sodium: 135 mmol/L (ref 135–145)

## 2023-08-10 LAB — CBG MONITORING, ED
Glucose-Capillary: >600 mg/dL (ref 70–99)
Glucose-Capillary: >600 mg/dL (ref 70–99)

## 2023-08-10 LAB — RESP PANEL BY RT-PCR (RSV, FLU A&B, COVID)  RVPGX2
Influenza A by PCR: NEGATIVE
Influenza B by PCR: NEGATIVE
Resp Syncytial Virus by PCR: NEGATIVE
SARS Coronavirus 2 by RT PCR: NEGATIVE

## 2023-08-10 LAB — HEMOGLOBIN A1C
Hgb A1c MFr Bld: 10.8 % — ABNORMAL HIGH (ref 4.8–5.6)
Mean Plasma Glucose: 263.26 mg/dL

## 2023-08-10 LAB — GC/CHLAMYDIA PROBE AMP (~~LOC~~) NOT AT ARMC
Chlamydia: NEGATIVE
Comment: NEGATIVE
Comment: NORMAL
Neisseria Gonorrhea: NEGATIVE

## 2023-08-10 LAB — MRSA NEXT GEN BY PCR, NASAL: MRSA by PCR Next Gen: NOT DETECTED

## 2023-08-10 MED ORDER — INSULIN REGULAR(HUMAN) IN NACL 100-0.9 UT/100ML-% IV SOLN
INTRAVENOUS | Status: DC
  Administered 2023-08-10: 16.2 [IU]/h via INTRAVENOUS
  Administered 2023-08-10: 4.6 [IU]/h via INTRAVENOUS
  Filled 2023-08-10 (×2): qty 100

## 2023-08-10 MED ORDER — DIAZEPAM 5 MG PO TABS: 2.5000 mg | ORAL_TABLET | Freq: Three times a day (TID) | ORAL | Status: DC | PRN

## 2023-08-10 MED ORDER — ONDANSETRON HCL 4 MG/2ML IJ SOLN: 4.0000 mg | Freq: Four times a day (QID) | INTRAMUSCULAR | Status: DC | PRN

## 2023-08-10 MED ORDER — HEPARIN SODIUM (PORCINE) 5000 UNIT/ML IJ SOLN
5000.0000 [IU] | Freq: Three times a day (TID) | INTRAMUSCULAR | Status: DC
  Administered 2023-08-10 – 2023-08-14 (×11): 5000 [IU] via SUBCUTANEOUS
  Filled 2023-08-10 (×11): qty 1

## 2023-08-10 MED ORDER — ENSURE ENLIVE PO LIQD: 237.0000 mL | Freq: Two times a day (BID) | ORAL | Status: DC

## 2023-08-10 MED ORDER — FINASTERIDE 5 MG PO TABS
5.0000 mg | ORAL_TABLET | Freq: Every day | ORAL | Status: DC
  Administered 2023-08-10 – 2023-08-14 (×5): 5 mg via ORAL
  Filled 2023-08-10 (×5): qty 1

## 2023-08-10 MED ORDER — FLUTICASONE FUROATE-VILANTEROL 200-25 MCG/ACT IN AEPB
1.0000 | INHALATION_SPRAY | Freq: Every day | RESPIRATORY_TRACT | Status: DC
  Administered 2023-08-10 – 2023-08-14 (×5): 1 via RESPIRATORY_TRACT
  Filled 2023-08-10: qty 28

## 2023-08-10 MED ORDER — INSULIN GLARGINE-YFGN 100 UNIT/ML ~~LOC~~ SOLN
30.0000 [IU] | Freq: Every day | SUBCUTANEOUS | Status: DC
  Filled 2023-08-10: qty 0.3

## 2023-08-10 MED ORDER — SENNOSIDES-DOCUSATE SODIUM 8.6-50 MG PO TABS: 1.0000 | ORAL_TABLET | Freq: Every evening | ORAL | Status: DC | PRN

## 2023-08-10 MED ORDER — INSULIN ASPART 100 UNIT/ML IJ SOLN
0.0000 [IU] | Freq: Every day | INTRAMUSCULAR | Status: DC
  Administered 2023-08-10: 4 [IU] via SUBCUTANEOUS
  Administered 2023-08-11 – 2023-08-13 (×2): 5 [IU] via SUBCUTANEOUS

## 2023-08-10 MED ORDER — TIZANIDINE HCL 2 MG PO TABS: 4.0000 mg | ORAL_TABLET | Freq: Three times a day (TID) | ORAL | Status: DC | PRN

## 2023-08-10 MED ORDER — ROSUVASTATIN CALCIUM 10 MG PO TABS
10.0000 mg | ORAL_TABLET | Freq: Every day | ORAL | Status: DC
  Administered 2023-08-10 – 2023-08-14 (×5): 10 mg via ORAL
  Filled 2023-08-10 (×5): qty 1

## 2023-08-10 MED ORDER — LACTATED RINGERS IV SOLN: INTRAVENOUS | Status: AC

## 2023-08-10 MED ORDER — LACTATED RINGERS IV BOLUS
2000.0000 mL | Freq: Once | INTRAVENOUS | Status: AC
  Administered 2023-08-10: 2000 mL via INTRAVENOUS

## 2023-08-10 MED ORDER — ONDANSETRON HCL 4 MG PO TABS: 4.0000 mg | ORAL_TABLET | Freq: Four times a day (QID) | ORAL | Status: DC | PRN

## 2023-08-10 MED ORDER — CHLORHEXIDINE GLUCONATE CLOTH 2 % EX PADS
6.0000 | MEDICATED_PAD | Freq: Every day | CUTANEOUS | Status: DC
  Administered 2023-08-10 – 2023-08-12 (×3): 6 via TOPICAL

## 2023-08-10 MED ORDER — POTASSIUM CHLORIDE CRYS ER 20 MEQ PO TBCR
40.0000 meq | EXTENDED_RELEASE_TABLET | Freq: Once | ORAL | Status: AC
  Administered 2023-08-10: 40 meq via ORAL
  Filled 2023-08-10: qty 2

## 2023-08-10 MED ORDER — DEXTROSE 50 % IV SOLN: 0.0000 mL | INTRAVENOUS | Status: DC | PRN

## 2023-08-10 MED ORDER — INSULIN ASPART 100 UNIT/ML IJ SOLN
0.0000 [IU] | Freq: Three times a day (TID) | INTRAMUSCULAR | Status: DC
  Administered 2023-08-10: 9 [IU] via SUBCUTANEOUS
  Administered 2023-08-10: 3 [IU] via SUBCUTANEOUS
  Administered 2023-08-11: 5 [IU] via SUBCUTANEOUS
  Administered 2023-08-11: 9 [IU] via SUBCUTANEOUS
  Administered 2023-08-11: 7 [IU] via SUBCUTANEOUS
  Administered 2023-08-12: 9 [IU] via SUBCUTANEOUS
  Administered 2023-08-12 (×2): 5 [IU] via SUBCUTANEOUS
  Administered 2023-08-13: 3 [IU] via SUBCUTANEOUS
  Administered 2023-08-13: 5 [IU] via SUBCUTANEOUS
  Administered 2023-08-14: 2 [IU] via SUBCUTANEOUS

## 2023-08-10 MED ORDER — HYDROXYZINE HCL 25 MG PO TABS: 50.0000 mg | ORAL_TABLET | Freq: Three times a day (TID) | ORAL | Status: DC | PRN

## 2023-08-10 MED ORDER — ATORVASTATIN CALCIUM 40 MG PO TABS: 40.0000 mg | ORAL_TABLET | Freq: Every day | ORAL | Status: DC

## 2023-08-10 MED ORDER — ALBUTEROL SULFATE (2.5 MG/3ML) 0.083% IN NEBU: 2.5000 mg | INHALATION_SOLUTION | RESPIRATORY_TRACT | Status: DC | PRN

## 2023-08-10 MED ORDER — FLUCONAZOLE 100 MG PO TABS
100.0000 mg | ORAL_TABLET | Freq: Every day | ORAL | Status: AC
  Administered 2023-08-11 – 2023-08-13 (×3): 100 mg via ORAL
  Filled 2023-08-10 (×3): qty 1

## 2023-08-10 MED ORDER — OXYCODONE HCL 5 MG PO TABS
15.0000 mg | ORAL_TABLET | Freq: Four times a day (QID) | ORAL | Status: DC | PRN
  Administered 2023-08-12: 15 mg via ORAL
  Filled 2023-08-10: qty 3

## 2023-08-10 MED ORDER — GABAPENTIN 400 MG PO CAPS
800.0000 mg | ORAL_CAPSULE | Freq: Three times a day (TID) | ORAL | Status: DC
  Administered 2023-08-10 – 2023-08-14 (×13): 800 mg via ORAL
  Filled 2023-08-10 (×13): qty 2

## 2023-08-10 MED ORDER — METOPROLOL TARTRATE 5 MG/5ML IV SOLN: 5.0000 mg | INTRAVENOUS | Status: DC | PRN

## 2023-08-10 MED ORDER — METOPROLOL TARTRATE 5 MG/5ML IV SOLN: 10.0000 mg | INTRAVENOUS | Status: DC | PRN

## 2023-08-10 MED ORDER — GUAIFENESIN 100 MG/5ML PO LIQD: 5.0000 mL | ORAL | Status: DC | PRN

## 2023-08-10 MED ORDER — NYSTATIN 100000 UNIT/GM EX POWD
Freq: Once | CUTANEOUS | Status: AC
  Filled 2023-08-10: qty 15

## 2023-08-10 MED ORDER — IPRATROPIUM-ALBUTEROL 0.5-2.5 (3) MG/3ML IN SOLN: 3.0000 mL | RESPIRATORY_TRACT | Status: DC | PRN

## 2023-08-10 MED ORDER — NYSTATIN 100000 UNIT/ML MT SUSP
5.0000 mL | Freq: Four times a day (QID) | OROMUCOSAL | Status: DC
  Administered 2023-08-10 – 2023-08-14 (×16): 500000 [IU] via ORAL
  Filled 2023-08-10 (×16): qty 5

## 2023-08-10 MED ORDER — HYDRALAZINE HCL 20 MG/ML IJ SOLN: 10.0000 mg | INTRAMUSCULAR | Status: DC | PRN

## 2023-08-10 MED ORDER — AMLODIPINE BESYLATE 10 MG PO TABS
10.0000 mg | ORAL_TABLET | Freq: Every day | ORAL | Status: DC
  Administered 2023-08-10 – 2023-08-14 (×5): 10 mg via ORAL
  Filled 2023-08-10 (×2): qty 2
  Filled 2023-08-10: qty 1
  Filled 2023-08-10 (×2): qty 2

## 2023-08-10 MED ORDER — INSULIN GLARGINE-YFGN 100 UNIT/ML ~~LOC~~ SOLN
30.0000 [IU] | Freq: Every day | SUBCUTANEOUS | Status: DC
  Administered 2023-08-10 – 2023-08-11 (×2): 30 [IU] via SUBCUTANEOUS
  Filled 2023-08-10 (×2): qty 0.3

## 2023-08-10 MED ORDER — DEXTROSE IN LACTATED RINGERS 5 % IV SOLN: INTRAVENOUS | Status: AC

## 2023-08-10 MED ORDER — ACETAMINOPHEN 325 MG PO TABS: 650.0000 mg | ORAL_TABLET | Freq: Four times a day (QID) | ORAL | Status: DC | PRN

## 2023-08-10 MED ORDER — TRAZODONE HCL 50 MG PO TABS: 50.0000 mg | ORAL_TABLET | Freq: Every evening | ORAL | Status: DC | PRN

## 2023-08-10 MED ORDER — NICOTINE 7 MG/24HR TD PT24
7.0000 mg | MEDICATED_PATCH | Freq: Every day | TRANSDERMAL | Status: DC
  Administered 2023-08-10 – 2023-08-14 (×5): 7 mg via TRANSDERMAL
  Filled 2023-08-10 (×5): qty 1

## 2023-08-10 MED ORDER — ACETAMINOPHEN 650 MG RE SUPP: 650.0000 mg | Freq: Four times a day (QID) | RECTAL | Status: DC | PRN

## 2023-08-10 MED ORDER — FLUCONAZOLE 150 MG PO TABS
150.0000 mg | ORAL_TABLET | Freq: Once | ORAL | Status: AC
  Administered 2023-08-10: 150 mg via ORAL
  Filled 2023-08-10: qty 1

## 2023-08-10 NOTE — Progress Notes (Signed)
   08/10/23 2228  BiPAP/CPAP/SIPAP  $ Non-Invasive Home Ventilator  Initial  $ Face Mask Large  Yes  BiPAP/CPAP/SIPAP Pt Type Adult  BiPAP/CPAP/SIPAP DREAMSTATIOND  Mask Type Full face mask (pt breathes with mouth open)  Dentures removed? No  Mask Size Large  Respiratory Rate 15 breaths/min  FiO2 (%) 21 %  Patient Home Equipment No  Auto Titrate Yes (automode, min5cm, max20cm h2o)  CPAP/SIPAP surface wiped down Yes  BiPAP/CPAP /SiPAP Vitals  Pulse Rate 68  Resp 15  Bilateral Breath Sounds Diminished;Clear

## 2023-08-10 NOTE — ED Notes (Signed)
 Will draw repeat BMP.

## 2023-08-10 NOTE — Progress Notes (Signed)
 PROGRESS NOTE    Peter Manning  FMW:[id number] DOB: 1955-12-17 DOA: 08/09/2023 PCP: Leron Millman, NP    Brief Narrative:  68 year old with history of DM2, HTN, HLD, COPD, tobacco use, anxiety/depression, chronic pain ran out of insulin  about a week ago admitted for hyperglycemia.  No evidence of acidosis at this time.   Assessment & Plan:  Principal Problem:   Hyperosmolar hyperglycemic state (HHS) (HCC)   Hyperosmolar hyperglycemic state Insulin -dependent diabetes mellitus type 2 - No evidence of acidosis.  Blood sugars are better, will start him on home Lantus  30 units along with sliding scale.  Slowly wean off insulin  drip 1 C10.8  Uncontrolled hypertension -Norvasc .  IV as needed.  Oral thrush and Candida under skin - Daily Diflucan  started.  Will complete total 7-day course  COPD Tobacco use -Bronchodilators.  Chronic pain - Has a pain stimulator in place  OSA -Bedtime CPAP  BPH -Proscar   HLD -Lipitor  Anxiety/depression -Supportive care.  Hypokalemia - As needed repletion   DVT prophylaxis: heparin  injection 5,000 Units Start: 08/10/23 0600    Code Status: Full Code Family Communication:   Status is: Inpatient Remains inpatient appropriate because: Seen at bedside, no other complaints.  Okay to transfer him to MedSurg once off insulin  drip for    Subjective: Being okay no other complaints at this time.  Tells me he ran out of his medications and he did not get it refilled by his PCP   Examination:  General exam: Appears calm and comfortable  Respiratory system: Clear to auscultation. Respiratory effort normal. Cardiovascular system: S1 & S2 heard, RRR. No JVD, murmurs, rubs, gallops or clicks. No pedal edema. Gastrointestinal system: Abdomen is nondistended, soft and nontender. No organomegaly or masses felt. Normal bowel sounds heard. Central nervous system: Alert and oriented. No focal neurological deficits. Extremities: Symmetric 5 x 5  power. Skin: No rashes, lesions or ulcers Psychiatry: Judgement and insight appear normal. Mood & affect appropriate.                Diet Orders (From admission, onward)     Start     Ordered   08/10/23 0209  Diet heart healthy/carb modified Room service appropriate? Yes; Fluid consistency: Thin  Diet effective now       Question Answer Comment  Diet-HS Snack? Nothing   Room service appropriate? Yes   Fluid consistency: Thin      08/10/23 0210            Objective: Vitals:   08/10/23 0828 08/10/23 0900 08/10/23 1000 08/10/23 1100  BP:  (!) 145/128 (!) 114/47   Pulse:  73 69 69  Resp:  15 15 16   Temp: 98.2 F (36.8 C)     TempSrc: Oral     SpO2:  95% (!) 89% 94%  Weight:      Height:        Intake/Output Summary (Last 24 hours) at 08/10/2023 1211 Last data filed at 08/10/2023 1205 Gross per 24 hour  Intake 3716.79 ml  Output 1075 ml  Net 2641.79 ml   Filed Weights   08/09/23 1952 08/10/23 0350  Weight: 106.6 kg 106 kg    Scheduled Meds:  amLODipine   10 mg Oral Daily   atorvastatin   40 mg Oral QHS   Chlorhexidine  Gluconate Cloth  6 each Topical Daily   feeding supplement  237 mL Oral BID BM   finasteride   5 mg Oral Daily   [START ON 08/11/2023] fluconazole   100 mg Oral Daily  fluticasone  furoate-vilanterol  1 puff Inhalation Daily   gabapentin   800 mg Oral TID   heparin   5,000 Units Subcutaneous Q8H   insulin  aspart  0-5 Units Subcutaneous QHS   insulin  aspart  0-9 Units Subcutaneous TID WC   insulin  glargine-yfgn  30 Units Subcutaneous QHS   nicotine   7 mg Transdermal Daily   nystatin   5 mL Oral QID   potassium chloride   40 mEq Oral Once   rosuvastatin   10 mg Oral Daily   Continuous Infusions:  dextrose  5% lactated ringers  125 mL/hr at 08/10/23 1205   insulin  5 Units/hr (08/10/23 1205)   lactated ringers  Stopped (08/10/23 0701)    Nutritional status     Body mass index is 35.53 kg/m.  Data Reviewed:   CBC: Recent Labs  Lab  08/09/23 2142  WBC 10.5  NEUTROABS 8.0*  HGB 17.0  HCT 50.2  MCV 95.1  PLT 228   Basic Metabolic Panel: Recent Labs  Lab 08/10/23 0104 08/10/23 0841  NA 120* 135  K 4.7 3.4*  CL 86* 100  CO2 21* 26  GLUCOSE 909* 223*  BUN 25* 17  CREATININE 1.48* 0.98  CALCIUM  9.9 10.2   GFR: Estimated Creatinine Clearance: 86.3 mL/min (by C-G formula based on SCr of 0.98 mg/dL). Liver Function Tests: No results for input(s): AST, ALT, ALKPHOS, BILITOT, PROT, ALBUMIN in the last 168 hours. No results for input(s): LIPASE, AMYLASE in the last 168 hours. No results for input(s): AMMONIA in the last 168 hours. Coagulation Profile: No results for input(s): INR, PROTIME in the last 168 hours. Cardiac Enzymes: No results for input(s): CKTOTAL, CKMB, CKMBINDEX, TROPONINI in the last 168 hours. BNP (last 3 results) No results for input(s): PROBNP in the last 8760 hours. HbA1C: Recent Labs    08/10/23 0841  HGBA1C 10.8*   CBG: Recent Labs  Lab 08/10/23 0754 08/10/23 0857 08/10/23 0951 08/10/23 1034 08/10/23 1142  GLUCAP 249* 222* 447* 292* 259*   Lipid Profile: No results for input(s): CHOL, HDL, LDLCALC, TRIG, CHOLHDL, LDLDIRECT in the last 72 hours. Thyroid Function Tests: No results for input(s): TSH, T4TOTAL, FREET4, T3FREE, THYROIDAB in the last 72 hours. Anemia Panel: No results for input(s): VITAMINB12, FOLATE, FERRITIN, TIBC, IRON, RETICCTPCT in the last 72 hours. Sepsis Labs: No results for input(s): PROCALCITON, LATICACIDVEN in the last 168 hours.  Recent Results (from the past 240 hours)  Resp panel by RT-PCR (RSV, Flu A&B, Covid) Anterior Nasal Swab     Status: None   Collection Time: 08/10/23  1:13 AM   Specimen: Anterior Nasal Swab  Result Value Ref Range Status   SARS Coronavirus 2 by RT PCR NEGATIVE NEGATIVE Final    Comment: (NOTE) SARS-CoV-2 target nucleic acids are NOT DETECTED.  The  SARS-CoV-2 RNA is generally detectable in upper respiratory specimens during the acute phase of infection. The lowest concentration of SARS-CoV-2 viral copies this assay can detect is 138 copies/mL. A negative result does not preclude SARS-Cov-2 infection and should not be used as the sole basis for treatment or other patient management decisions. A negative result may occur with  improper specimen collection/handling, submission of specimen other than nasopharyngeal swab, presence of viral mutation(s) within the areas targeted by this assay, and inadequate number of viral copies(<138 copies/mL). A negative result must be combined with clinical observations, patient history, and epidemiological information. The expected result is Negative.  Fact Sheet for Patients:  bloggercourse.com  Fact Sheet for Healthcare Providers:  seriousbroker.it  This test is  no t yet approved or cleared by the [place]  FDA and  has been authorized for detection and/or diagnosis of SARS-CoV-2 by FDA under an Emergency Use Authorization (EUA). This EUA will remain  in effect (meaning this test can be used) for the duration of the COVID-19 declaration under Section 564(b)(1) of the Act, 21 U.S.C.section 360bbb-3(b)(1), unless the authorization is terminated  or revoked sooner.       Influenza A by PCR NEGATIVE NEGATIVE Final   Influenza B by PCR NEGATIVE NEGATIVE Final    Comment: (NOTE) The Xpert Xpress SARS-CoV-2/FLU/RSV plus assay is intended as an aid in the diagnosis of influenza from Nasopharyngeal swab specimens and should not be used as a sole basis for treatment. Nasal washings and aspirates are unacceptable for Xpert Xpress SARS-CoV-2/FLU/RSV testing.  Fact Sheet for Patients: bloggercourse.com  Fact Sheet for Healthcare Providers: seriousbroker.it  This test is not yet approved or  cleared by the [place]  FDA and has been authorized for detection and/or diagnosis of SARS-CoV-2 by FDA under an Emergency Use Authorization (EUA). This EUA will remain in effect (meaning this test can be used) for the duration of the COVID-19 declaration under Section 564(b)(1) of the Act, 21 U.S.C. section 360bbb-3(b)(1), unless the authorization is terminated or revoked.     Resp Syncytial Virus by PCR NEGATIVE NEGATIVE Final    Comment: (NOTE) Fact Sheet for Patients: bloggercourse.com  Fact Sheet for Healthcare Providers: seriousbroker.it  This test is not yet approved or cleared by the [place]  FDA and has been authorized for detection and/or diagnosis of SARS-CoV-2 by FDA under an Emergency Use Authorization (EUA). This EUA will remain in effect (meaning this test can be used) for the duration of the COVID-19 declaration under Section 564(b)(1) of the Act, 21 U.S.C. section 360bbb-3(b)(1), unless the authorization is terminated or revoked.  Performed at [facility], [address], [place], [place] [address]   MRSA Next Gen by PCR, Nasal     Status: None   Collection Time: 08/10/23  8:00 AM   Specimen: Nasal Mucosa; Nasal Swab  Result Value Ref Range Status   MRSA by PCR Next Gen NOT DETECTED NOT DETECTED Final    Comment: (NOTE) The GeneXpert MRSA Assay (FDA approved for NASAL specimens only), is one component of a comprehensive MRSA colonization surveillance program. It is not intended to diagnose MRSA infection nor to guide or monitor treatment for MRSA infections. Test performance is not FDA approved in patients less than 34 years old. Performed at [facility], [address], [place], [place] [address]          Radiology Studies: DG Chest 2 View Result Date: 08/09/2023 CLINICAL DATA:  Cough EXAM: CHEST - 2 VIEW COMPARISON:  Chest x-ray 07/26/2020 FINDINGS:  The heart size and mediastinal contours are within normal limits. Both lungs are clear. The visualized skeletal structures are unremarkable. IMPRESSION: No active cardiopulmonary disease. Electronically Signed   By: Greig Pique M.D.   On: 08/09/2023 20:30           LOS: 0 days   Time spent= 35 mins    Burgess JAYSON Dare, MD [facility]  If 7PM-7AM, please contact night-coverage  08/10/2023, 12:11 PM

## 2023-08-10 NOTE — Inpatient Diabetes Management (Signed)
 Inpatient Diabetes Program Recommendations  AACE/ADA: New Consensus Statement on Inpatient Glycemic Control (2015)  Target Ranges:  Prepandial:   less than 140 mg/dL      Peak postprandial:   less than 180 mg/dL (1-2 hours)      Critically ill patients:  140 - 180 mg/dL   Lab Results  Component Value Date   GLUCAP 259 (H) 08/10/2023   HGBA1C 10.8 (H) 08/10/2023    Review of Glycemic Control  Diabetes history: DM2 Outpatient Diabetes medications: Lantus  30 at bedtime, metformin  500 mg BID, states he is not taking Novolog  Current orders for Inpatient glycemic control: Lantus  30 units at bedtime, Novolog  0-9 TID with meals and 0-5 HS  HgbA1C - 10.8%  Inpatient Diabetes Program Recommendations:   Recs for home:  Lantus  30 units at bedtime  Novolog   4 units TID with meals   Metformin  - consider increasing to 1000 mg BID  Spoke with pt at bedside regarding his diabetes control and HgbA1C of10.8%. Pt states he ran out of Lantus  and hasn't taken for at least a week. Has not been monitoring blood sugars, drinking sweet beverages and eating what he wants. Discussed importance of checking CBGs and maintaining good CBG control to prevent long-term and short-term complications. Advised to monitor at least 2-3x/day and take logbook to PCP for review. Explained how hyperglycemia leads to damage within blood vessels which lead to the common complications seen with uncontrolled diabetes. Stressed to the patient the importance of improving glycemic control to prevent further complications from uncontrolled diabetes. Discussed impact of nutrition, exercise, stress, sickness, and medications on diabetes control.  Discussed carbohydrates, adding high fiber foods and portion control.  Pt appreciative of visit. States he knows he needs to get back on track with taking care of himself. Patient verbalized understanding of information discussed and reports no further questions at this time related to  diabetes.  Thank you. Shona Brandy, RD, LDN, CDCES Inpatient Diabetes Coordinator [phone]

## 2023-08-10 NOTE — TOC Initial Note (Signed)
 Transition of Care [facility]) - Initial/Assessment Note    Patient Details  Name: Peter Manning MRN: [id number] Date of Birth: 1955/12/22  Transition of Care [facility] CM/SW Contact:    Bascom Service, RN Phone Number: 08/10/2023, 3:00 PM  Clinical Narrative: From [facility] own transport home.Monitor for d/c needs.                  Expected Discharge Plan: Home/Self Care Barriers to Discharge: Continued Medical Work up   Patient Goals and CMS Choice Patient states their goals for this hospitalization and ongoing recovery are:: [facility] [employer].gov Compare Post Acute Care list provided to:: Patient Represenative (must comment) Choice offered to / list presented to : Patient [place] ownership interest in [facility].provided to:: Patient    Expected Discharge Plan and Services     Post Acute Care Choice: Resumption of Svcs/PTA Provider Living arrangements for the past 2 months: Independent Living Facility                                      Prior Living Arrangements/Services Living arrangements for the past 2 months: Independent Living Facility Lives with:: Self   Do you feel safe going back to the place where you live?: Yes               Activities of Daily Living   ADL Screening (condition at time of admission) Independently performs ADLs?: Yes (appropriate for developmental age) Is the patient deaf or have difficulty hearing?: No Does the patient have difficulty seeing, even when wearing glasses/contacts?: No Does the patient have difficulty concentrating, remembering, or making decisions?: Yes  Permission Sought/Granted Permission sought to share information with : Case Manager Permission granted to share information with : Yes, Verbal Permission Granted  Share Information with NAME: Case Manager           Emotional Assessment              Admission diagnosis:  Hyperosmolar hyperglycemic  state (HHS) (HCC) [E11.00] Patient Active Problem List   Diagnosis Date Noted   Hyperosmolar hyperglycemic state (HHS) (HCC) 08/10/2023   Hyperosmolar hyperglycemic coma due to diabetes mellitus without ketoacidosis (HCC) 08/26/2018   AKI (acute kidney injury) (HCC) 08/26/2018   Hyperkalemia 08/26/2018   Spinal stenosis of cervical region 04/20/2018   Foraminal stenosis of cervical region 04/20/2018   Chronic left shoulder pain 01/08/2018   Lumbar foraminal stenosis 01/08/2018   S/P lumbar fusion 05/03/2017   Impingement syndrome of left shoulder 05/03/2017   S/P total knee arthroplasty, right 12/28/2016   Right knee DJD 10/10/2016   S/P revision of total hip 11/18/2015   OSA (obstructive sleep apnea) 12/09/2011   Chronic bronchitis (HCC) 12/09/2011   Tobacco abuse 12/09/2011   PCP:  Leron Millman, NP Pharmacy:   [facility] [phone] - [place], [place] - [address] AT [address]NER OF [facility] [address] [place] [address] Phone: [phone] Fax: [phone]     Social Drivers of Health (SDOH) Social History: SDOH Screenings   Food Insecurity: No Food Insecurity (08/10/2023)  Housing: Low Risk  (08/10/2023)  Transportation Needs: No Transportation Needs (08/10/2023)  Utilities: Not At Risk (08/10/2023)  Social Connections: Unknown (11/15/2021)   [facility]  Tobacco Use: High Risk (08/09/2023)   SDOH Interventions:     Readmission Risk Interventions     No data  to display

## 2023-08-10 NOTE — Hospital Course (Addendum)
 Brief Narrative:  68 year old with history of DM2, HTN, HLD, COPD, tobacco use, anxiety/depression, chronic pain ran out of insulin  about a week ago admitted for hyperglycemia.  No evidence of acidosis at this time.   Assessment & Plan:  Principal Problem:   Hyperosmolar hyperglycemic state (HHS) (HCC)   Hyperosmolar hyperglycemic state Insulin -dependent diabetes mellitus type 2 - No evidence of acidosis.  Blood sugars are better, will start him on home Lantus  30 units along with sliding scale.  Slowly wean off insulin  drip 1 C10.8  Uncontrolled hypertension -Norvasc .  IV as needed.  Oral thrush and Candida under skin - Daily Diflucan  started.  Will complete total 7-day course  COPD Tobacco use -Bronchodilators.  Chronic pain - Has a pain stimulator in place  OSA -Bedtime CPAP  BPH -Proscar   HLD -Lipitor  Anxiety/depression -Supportive care.  Hypokalemia - As needed repletion   DVT prophylaxis: heparin  injection 5,000 Units Start: 08/10/23 0600    Code Status: Full Code Family Communication:   Status is: Inpatient Remains inpatient appropriate because: Seen at bedside, no other complaints.  Okay to transfer him to MedSurg once off insulin  drip for    Subjective: Being okay no other complaints at this time.  Tells me he ran out of his medications and he did not get it refilled by his PCP   Examination:  General exam: Appears calm and comfortable  Respiratory system: Clear to auscultation. Respiratory effort normal. Cardiovascular system: S1 & S2 heard, RRR. No JVD, murmurs, rubs, gallops or clicks. No pedal edema. Gastrointestinal system: Abdomen is nondistended, soft and nontender. No organomegaly or masses felt. Normal bowel sounds heard. Central nervous system: Alert and oriented. No focal neurological deficits. Extremities: Symmetric 5 x 5 power. Skin: No rashes, lesions or ulcers Psychiatry: Judgement and insight appear normal. Mood & affect  appropriate.

## 2023-08-10 NOTE — H&P (Addendum)
 PCP:   Leron Millman, NP   Chief Complaint:  Out of insulin   HPI: This is a 68 year old male with past medical history of T2DM, HLD, HTN, OSA, COPD, ongoing tobacco use, anxiety and depression, chronic pain syndrome.  He ran out of his insulin  approximately a week ago.  Today he started feeling weak.  He did not take his previously blood sugars.  No history of himself to the ER.  Patient is a scant historian.  Per EDP patient came in with complaints about his penis and a rash on his tongue and penis.  In the ER patient's glucose of 909, CO2 21, sodium 120, creatinine 1.48.  Anion gap 13.  Respiratory panel negative.  Patient diagnosed with HHS.  Insulin  drip ordered.  Admission requested  Review of Systems:  Per HPI  Past Medical History: Past Medical History:  Diagnosis Date   Bronchitis    Depression    Hepatitis 1976   pt. doesn't remember if A, B, C?   History of kidney stones    Hyperlipidemia    Hypertension    OSA (obstructive sleep apnea) 12/09/2011   PSG 01/03/12>>AHI 45.7, SpO2 low 73%, PLMI 0.  CPAP 15 cm H2O>>AHI 0, +R.    Pneumonia 2015   Renal disorder    had kidney stone   Sleep apnea    no CPAP ordered per patient/over 3 years   Past Surgical History:  Procedure Laterality Date   BACK SURGERY  2001   put in screws per pt.   COLONOSCOPY     CYSTOSCOPY WITH RETROGRADE PYELOGRAM, URETEROSCOPY AND STENT PLACEMENT Left 07/06/2017   Procedure: CYSTOSCOPY WITH LEFT RETROGRADE PYELOGRAM, URETEROSCOPY WITH HOLMIUM LASER BASKET EXTRACTION AND STENT PLACEMENT;  Surgeon: Watt Rush, MD;  Location: [facility];  Service: Urology;  Laterality: Left;   JOINT REPLACEMENT     REVISION TOTAL HIP ARTHROPLASTY Left 11/2015   TOTAL HIP ARTHROPLASTY     left hip   TOTAL HIP REVISION Left 11/18/2015   Procedure: LEFT TOTAL HIP REVISION;  Surgeon: Oneil JAYSON Herald, MD;  Location: [facility] OR;  Service: Orthopedics;  Laterality: Left;   TOTAL KNEE ARTHROPLASTY Right 10/10/2016    Procedure: TOTAL KNEE ARTHROPLASTY;  Surgeon: Oneil JAYSON Herald, MD;  Location: [facility] OR;  Service: Orthopedics;  Laterality: Right;    Medications: Prior to Admission medications   Medication Sig Start Date End Date Taking? Authorizing Provider  etodolac (LODINE XL) 400 MG 24 hr tablet Take 400 mg by mouth daily. 06/07/23  Yes [provider]  finasteride  (PROSCAR ) 5 MG tablet Take 5 mg by mouth daily. 06/05/23  Yes [provider]  oxyCODONE  (ROXICODONE ) 15 MG immediate release tablet Take 15 mg by mouth 4 (four) times daily as needed. 08/01/23  Yes [provider]  rosuvastatin  (CRESTOR ) 10 MG tablet Take 10 mg by mouth daily. 06/10/23  Yes [provider]  albuterol  (PROVENTIL  HFA;VENTOLIN  HFA) 108 (90 Base) MCG/ACT inhaler Inhale 2 puffs into the lungs every 4 (four) hours as needed for wheezing or shortness of breath. 07/19/17   Dasie Faden, MD  amLODipine  (NORVASC ) 10 MG tablet Take 10 mg by mouth daily.  08/24/16   [provider]  atorvastatin  (LIPITOR) 40 MG tablet Take 40 mg by mouth at bedtime. 10/10/19   [provider]  blood glucose meter kit and supplies KIT Dispense based on patient and insurance preference. Use up to four times daily as directed. (FOR ICD-9 250.00, 250.01). 08/29/18  Odell Celinda Balo, MD  diazepam  (VALIUM ) 5 MG tablet Take 0.5 tablets (2.5 mg total) by mouth every 8 (eight) hours as needed for anxiety (spasms). 07/26/20   Mesner, Selinda, MD  diclofenac  (VOLTAREN ) 75 MG EC tablet Take 1 tablet (75 mg total) by mouth 2 (two) times daily. 03/16/22   Sofia, Leslie K, PA-C  diclofenac  Sodium (VOLTAREN ) 1 % GEL Apply 4 g topically 4 (four) times daily. 01/07/22   Petrucelli, Samantha R, PA-C  doxycycline  (VIBRAMYCIN ) 100 MG capsule Take 1 capsule (100 mg total) by mouth 2 (two) times daily. 01/07/22   Petrucelli, Samantha R, PA-C  gabapentin  (NEURONTIN ) 800 MG tablet Take 800 mg by mouth 3 (three) times daily. 10/11/19   [provider]  hydrOXYzine  (ATARAX /VISTARIL ) 50 MG tablet Take 50 mg by mouth every 8 (eight) hours as needed for anxiety.  07/10/18   [provider]  insulin  aspart (NOVOLOG  PENFILL) cartridge Inject 3 Units into the skin 3 (three) times daily with meals. 08/29/18   Odell Celinda Balo, MD  Insulin  Detemir (LEVEMIR ) 100 UNIT/ML Pen Inject 30 Units into the skin daily. 08/29/18   Odell Celinda Balo, MD  Insulin  Pen Needle 31G X 6 MM MISC 1 Device by Does not apply route 2 (two) times daily. 08/29/18   Odell Celinda Balo, MD  lidocaine  (LIDODERM ) 5 % Place 1 patch onto the skin daily. Remove & Discard patch within 12 hours or as directed by MD 04/10/21   Horton, Roxie HERO, DO  metFORMIN  (GLUCOPHAGE ) 500 MG tablet Take 1 tablet (500 mg total) by mouth 2 (two) times daily with a meal. 08/29/18 11/02/28  Odell Celinda Balo, MD  methocarbamol  (ROBAXIN ) 500 MG tablet Take 1 tablet (500 mg total) by mouth 2 (two) times daily. 07/28/20   Neldon Hamp RAMAN, PA  methylPREDNISolone  (MEDROL  DOSEPAK) 4 MG TBPK tablet Take as directed 04/10/21   Horton, Kristie M, DO  naproxen  (NAPROSYN ) 375 MG tablet Take 1 tablet (375 mg total) by mouth 2 (two) times daily. 03/20/21   Randol Simmonds, MD  ondansetron  (ZOFRAN -ODT) 4 MG disintegrating tablet 4mg  ODT q4 hours prn nausea/vomit 07/22/22   Pollina, Lonni PARAS, MD  oxyCODONE -acetaminophen  (PERCOCET) 10-325 MG tablet Take 1 tablet by mouth 4 (four) times daily.    [provider]  potassium chloride  SA (KLOR-CON  M) 20 MEQ tablet Take 1 tablet (20 mEq total) by mouth daily. 01/07/22   Petrucelli, Samantha R, PA-C  SYMBICORT 160-4.5 MCG/ACT inhaler Inhale 2 puffs into the lungs daily as needed (shortness of breath).  08/05/18   [provider]  traZODone  (DESYREL ) 50 MG tablet Take 50 mg by mouth at bedtime as needed for sleep.  08/21/18   [provider]  Vitamin D, Ergocalciferol, (DRISDOL) 1.25 MG (50000 UNIT) CAPS capsule Take 50,000 Units by  mouth every Monday. 10/07/19   [provider]    Allergies:  No Known Allergies  Social History:  reports that he has been smoking cigarettes. He has a 23 pack-year smoking history. He has never used smokeless tobacco. He reports that he does not drink alcohol and does not use drugs.  Family History: Family History  Problem Relation Age of Onset   Heart attack Father    Heart disease Father    Cancer Mother    Diabetes Sister    Diabetes Brother    Colon cancer Neg Hx    Esophageal cancer Neg Hx    Rectal cancer Neg Hx    Stomach cancer  Neg Hx     Physical Exam: Vitals:   08/09/23 1951 08/09/23 1952 08/09/23 2308  BP: (!) 143/72  (!) 172/71  Pulse: 96  80  Resp: 20  18  Temp: 97.6 F (36.4 C)  (!) 97.5 F (36.4 C)  TempSrc: Oral  Oral  SpO2: 96%  96%  Weight:  106.6 kg   Height:  5' 8 (1.727 m)     General:  A&Ox3, obese male, no acute distress Eyes: Pink conjunctiva, no scleral icterus ENT: Moist oral mucosa, neck supple, no thyromegaly Lungs: CTA B/L, no wheeze, no crackles, no use of accessory muscles Cardiovascular: RRR, no regurgitation, no gallops, no murmurs. N Abdomen: soft, positive BS, NTND not an acute abdomen GU: not examined Neuro: CN II - XII grossly intact, sensation intact Musculoskeletal: strength 5/5 all extremities, no  edema Skin: no rash, no subcutaneous crepitation, no decubitus Psych: appropriate patient  Labs on Admission:  Recent Labs    08/10/23 0104  NA 120*  K 4.7  CL 86*  CO2 21*  GLUCOSE 909*  BUN 25*  CREATININE 1.48*  CALCIUM  9.9    Recent Labs    08/09/23 2142  WBC 10.5  NEUTROABS 8.0*  HGB 17.0  HCT 50.2  MCV 95.1  PLT 228    Micro Results: Recent Results (from the past 240 hours)  Resp panel by RT-PCR (RSV, Flu A&B, Covid) Anterior Nasal Swab     Status: None   Collection Time: 08/10/23  1:13 AM   Specimen: Anterior Nasal Swab  Result Value Ref Range Status   SARS Coronavirus 2 by RT PCR  NEGATIVE NEGATIVE Final    Comment: (NOTE) SARS-CoV-2 target nucleic acids are NOT DETECTED.  The SARS-CoV-2 RNA is generally detectable in upper respiratory specimens during the acute phase of infection. The lowest concentration of SARS-CoV-2 viral copies this assay can detect is 138 copies/mL. A negative result does not preclude SARS-Cov-2 infection and should not be used as the sole basis for treatment or other patient management decisions. A negative result may occur with  improper specimen collection/handling, submission of specimen other than nasopharyngeal swab, presence of viral mutation(s) within the areas targeted by this assay, and inadequate number of viral copies(<138 copies/mL). A negative result must be combined with clinical observations, patient history, and epidemiological information. The expected result is Negative.  Fact Sheet for Patients:  bloggercourse.com  Fact Sheet for Healthcare Providers:  seriousbroker.it  This test is no t yet approved or cleared by the [place]  FDA and  has been authorized for detection and/or diagnosis of SARS-CoV-2 by FDA under an Emergency Use Authorization (EUA). This EUA will remain  in effect (meaning this test can be used) for the duration of the COVID-19 declaration under Section 564(b)(1) of the Act, 21 U.S.C.section 360bbb-3(b)(1), unless the authorization is terminated  or revoked sooner.       Influenza A by PCR NEGATIVE NEGATIVE Final   Influenza B by PCR NEGATIVE NEGATIVE Final    Comment: (NOTE) The Xpert Xpress SARS-CoV-2/FLU/RSV plus assay is intended as an aid in the diagnosis of influenza from Nasopharyngeal swab specimens and should not be used as a sole basis for treatment. Nasal washings and aspirates are unacceptable for Xpert Xpress SARS-CoV-2/FLU/RSV testing.  Fact Sheet for Patients: bloggercourse.com  Fact Sheet for  Healthcare Providers: seriousbroker.it  This test is not yet approved or cleared by the [place]  FDA and has been authorized for detection and/or diagnosis of SARS-CoV-2 by FDA under  an Emergency Use Authorization (EUA). This EUA will remain in effect (meaning this test can be used) for the duration of the COVID-19 declaration under Section 564(b)(1) of the Act, 21 U.S.C. section 360bbb-3(b)(1), unless the authorization is terminated or revoked.     Resp Syncytial Virus by PCR NEGATIVE NEGATIVE Final    Comment: (NOTE) Fact Sheet for Patients: bloggercourse.com  Fact Sheet for Healthcare Providers: seriousbroker.it  This test is not yet approved or cleared by the [place]  FDA and has been authorized for detection and/or diagnosis of SARS-CoV-2 by FDA under an Emergency Use Authorization (EUA). This EUA will remain in effect (meaning this test can be used) for the duration of the COVID-19 declaration under Section 564(b)(1) of the Act, 21 U.S.C. section 360bbb-3(b)(1), unless the authorization is terminated or revoked.  Performed at [facility], [address] [place], [place] [address]      Radiological Exams on Admission: DG Chest 2 View Result Date: 08/09/2023 CLINICAL DATA:  Cough EXAM: CHEST - 2 VIEW COMPARISON:  Chest x-ray 07/26/2020 FINDINGS: The heart size and mediastinal contours are within normal limits. Both lungs are clear. The visualized skeletal structures are unremarkable. IMPRESSION: No active cardiopulmonary disease. Electronically Signed   By: Greig Pique M.D.   On: 08/09/2023 20:30    Assessment/Plan Present on Admission:  Hyperosmolar hyperglycemic state (HHS) (HCC) -Endo tool for HHS -Insulin  drip, aggressive IV fluid hydration -Corrected sodium 136.  AKI d/t polyuria.  Aggressive IV fluids.  Follow-up BMP ordered   HTN, uncontrolled -Norvasc   resumed, first dose now -As needed Lopressor  ordered   Thrush //  yeast under foreskin -Nystatin  swish and spit initiated in the ER, continued -P.o. prednisone  started in ER, continued   COPD //  Tobacco use -Symbicort reordered -Nicotine  patch ordered -As needed nebulizers ordered   Chronic pain syndrome -Patient has a pain stimulator in place -Robaxin , Percocet resumed   OSA -Does not use CPAP   BPH -Proscar  resumed   HLD -Atorvastatin , Crestor  resumed   Anxiety and depression -Trazodone  resumed  Merleen Picazo 08/10/2023, 2:10 AM

## 2023-08-10 NOTE — ED Notes (Signed)
 ED TO INPATIENT HANDOFF REPORT  Name/Age/Gender Peter Manning 68 y.o. male  Code Status    Code Status Orders  (From admission, onward)           Start     Ordered   08/10/23 0209  Full code  Continuous       Question:  By:  Answer:  Consent: discussion documented in EHR   08/10/23 0210           Code Status History     Date Active Date Inactive Code Status Order ID Comments User Context   08/26/2018 2325 08/29/2018 2203 Full Code [id number]  Peter Evalene RAMAN, MD ED   10/10/2016 1821 10/12/2016 1638 Full Code [id number]  Peter Manning Inpatient   10/24/2011 2120 10/25/2011 1641 Full Code [id number]  Peter Senior, MD ED       Home/SNF/Other Home  Chief Complaint Hyperosmolar hyperglycemic state (HHS) (HCC) [E11.00]  Level of Care/Admitting Diagnosis ED Disposition     ED Disposition  Admit   Condition  --   Comment  Hospital Area: [facility] [[id number]]  Level of Care: Stepdown [14]  Admit to SDU based on following criteria: Other see comments  Comments: endocrine instability  May admit patient to [facility] or [facility] if equivalent level of care is available:: Yes  Covid Evaluation: Confirmed COVID Negative  Diagnosis: Hyperosmolar hyperglycemic state (HHS) [facility]) [[id number]]  Admitting Physician: LAVEDA ROOSEVELT [[id number]]  Attending Physician: LAVEDA ROOSEVELT [[id number]]  Certification:: I certify this patient will need inpatient services for at least 2 midnights  Expected Medical Readiness: 08/12/2023          Medical History Past Medical History:  Diagnosis Date   Bronchitis    Depression    Hepatitis 1976   pt. doesn't remember if A, B, C?   History of kidney stones    Hyperlipidemia    Hypertension    OSA (obstructive sleep apnea) 12/09/2011   PSG 01/03/12>>AHI 45.7, SpO2 low 73%, PLMI 0.  CPAP 15 cm H2O>>AHI 0, +R.    Pneumonia 2015   Renal disorder    had kidney stone   Sleep apnea    no CPAP ordered per patient/over 3  years    Allergies No Known Allergies  IV Location/Drains/Wounds Patient Lines/Drains/Airways Status     Active Line/Drains/Airways     Name Placement date Placement time Site Days   Peripheral IV 08/10/23 20 G 1 Anterior;Distal;Right Forearm 08/10/23  0101  Forearm  less than 1   Peripheral IV 08/10/23 20 G 1 Anterior;Right Forearm 08/10/23  0212  Forearm  less than 1   Ureteral Drain/Stent Left ureter 6 Fr. 07/06/17  0945  Left ureter  2226            Labs/Imaging Results for orders placed or performed during the hospital encounter of 08/09/23 (from the past 48 hours)  Urinalysis, Routine w reflex microscopic -Urine, Clean Catch     Status: Abnormal   Collection Time: 08/09/23  8:10 PM  Result Value Ref Range   Color, Urine STRAW (A) YELLOW   APPearance CLEAR CLEAR   Specific Gravity, Urine 1.026 1.005 - 1.030   pH 5.0 5.0 - 8.0   Glucose, UA >=500 (A) NEGATIVE mg/dL   Hgb urine dipstick SMALL (A) NEGATIVE   Bilirubin Urine NEGATIVE NEGATIVE   Ketones, ur NEGATIVE NEGATIVE mg/dL   Protein, ur NEGATIVE NEGATIVE mg/dL   Nitrite NEGATIVE NEGATIVE   Leukocytes,Ua NEGATIVE NEGATIVE  RBC / HPF 0-5 0 - 5 RBC/hpf   WBC, UA 0-5 0 - 5 WBC/hpf   Bacteria, UA NONE SEEN NONE SEEN   Squamous Epithelial / HPF 0-5 0 - 5 /HPF   Mucus PRESENT     Comment: Performed at [facility], [address] [place], [place] [address]  POC CBG, ED     Status: Abnormal   Collection Time: 08/09/23  9:25 PM  Result Value Ref Range   Glucose-Capillary >600 (HH) 70 - 99 mg/dL    Comment: Glucose reference range applies only to samples taken after fasting for at least 8 hours.   Comment 1 Notify RN   CBC with Differential     Status: Abnormal   Collection Time: 08/09/23  9:42 PM  Result Value Ref Range   WBC 10.5 4.0 - 10.5 K/uL   RBC 5.28 4.22 - 5.81 MIL/uL   Hemoglobin 17.0 13.0 - 17.0 g/dL   HCT 49.7 60.9 - 47.9 %   MCV 95.1 80.0 - 100.0 fL   MCH 32.2 26.0 - 34.0 pg    MCHC 33.9 30.0 - 36.0 g/dL   RDW 88.0 88.4 - 84.4 %   Platelets 228 150 - 400 K/uL   nRBC 0.0 0.0 - 0.2 %   Neutrophils Relative % 76 %   Neutro Abs 8.0 (H) 1.7 - 7.7 K/uL   Lymphocytes Relative 16 %   Lymphs Abs 1.7 0.7 - 4.0 K/uL   Monocytes Relative 5 %   Monocytes Absolute 0.5 0.1 - 1.0 K/uL   Eosinophils Relative 1 %   Eosinophils Absolute 0.1 0.0 - 0.5 K/uL   Basophils Relative 1 %   Basophils Absolute 0.1 0.0 - 0.1 K/uL   Immature Granulocytes 1 %   Abs Immature Granulocytes 0.06 0.00 - 0.07 K/uL    Comment: Performed at [facility], [address] [place], [place] [address]  CBG monitoring, ED     Status: Abnormal   Collection Time: 08/09/23 11:29 PM  Result Value Ref Range   Glucose-Capillary >600 (HH) 70 - 99 mg/dL    Comment: Glucose reference range applies only to samples taken after fasting for at least 8 hours.  Basic metabolic panel     Status: Abnormal   Collection Time: 08/10/23  1:04 AM  Result Value Ref Range   Sodium 120 (L) 135 - 145 mmol/L   Potassium 4.7 3.5 - 5.1 mmol/L    Comment: HEMOLYSIS AT THIS LEVEL MAY AFFECT RESULT   Chloride 86 (L) 98 - 111 mmol/L   CO2 21 (L) 22 - 32 mmol/L   Glucose, Bld 909 (HH) 70 - 99 mg/dL    Comment: CRITICAL RESULT CALLED TO, READ BACK BY AND VERIFIED WITH Armanii Pressnell, D. RN AT 0131 ON 2.6.25. FA Glucose reference range applies only to samples taken after fasting for at least 8 hours.    BUN 25 (H) 8 - 23 mg/dL   Creatinine, Ser 8.51 (H) 0.61 - 1.24 mg/dL   Calcium  9.9 8.9 - 10.3 mg/dL   GFR, Estimated 52 (L) >60 mL/min    Comment: (NOTE) Calculated using the CKD-EPI Creatinine Equation (2021)    Anion gap 13 5 - 15    Comment: Performed at [facility], [address], [place], [place] [address]  Resp panel by RT-PCR (RSV, Flu A&B, Covid) Anterior Nasal Swab     Status: None   Collection Time: 08/10/23  1:13 AM   Specimen: Anterior  Nasal Swab  Result Value Ref Range   SARS  Coronavirus 2 by RT PCR NEGATIVE NEGATIVE    Comment: (NOTE) SARS-CoV-2 target nucleic acids are NOT DETECTED.  The SARS-CoV-2 RNA is generally detectable in upper respiratory specimens during the acute phase of infection. The lowest concentration of SARS-CoV-2 viral copies this assay can detect is 138 copies/mL. A negative result does not preclude SARS-Cov-2 infection and should not be used as the sole basis for treatment or other patient management decisions. A negative result may occur with  improper specimen collection/handling, submission of specimen other than nasopharyngeal swab, presence of viral mutation(s) within the areas targeted by this assay, and inadequate number of viral copies(<138 copies/mL). A negative result must be combined with clinical observations, patient history, and epidemiological information. The expected result is Negative.  Fact Sheet for Patients:  bloggercourse.com  Fact Sheet for Healthcare Providers:  seriousbroker.it  This test is no t yet approved or cleared by the [place]  FDA and  has been authorized for detection and/or diagnosis of SARS-CoV-2 by FDA under an Emergency Use Authorization (EUA). This EUA will remain  in effect (meaning this test can be used) for the duration of the COVID-19 declaration under Section 564(b)(1) of the Act, 21 U.S.C.section 360bbb-3(b)(1), unless the authorization is terminated  or revoked sooner.       Influenza A by PCR NEGATIVE NEGATIVE   Influenza B by PCR NEGATIVE NEGATIVE    Comment: (NOTE) The Xpert Xpress SARS-CoV-2/FLU/RSV plus assay is intended as an aid in the diagnosis of influenza from Nasopharyngeal swab specimens and should not be used as a sole basis for treatment. Nasal washings and aspirates are unacceptable for Xpert Xpress SARS-CoV-2/FLU/RSV testing.  Fact Sheet for Patients: bloggercourse.com  Fact Sheet  for Healthcare Providers: seriousbroker.it  This test is not yet approved or cleared by the [place]  FDA and has been authorized for detection and/or diagnosis of SARS-CoV-2 by FDA under an Emergency Use Authorization (EUA). This EUA will remain in effect (meaning this test can be used) for the duration of the COVID-19 declaration under Section 564(b)(1) of the Act, 21 U.S.C. section 360bbb-3(b)(1), unless the authorization is terminated or revoked.     Resp Syncytial Virus by PCR NEGATIVE NEGATIVE    Comment: (NOTE) Fact Sheet for Patients: bloggercourse.com  Fact Sheet for Healthcare Providers: seriousbroker.it  This test is not yet approved or cleared by the [place]  FDA and has been authorized for detection and/or diagnosis of SARS-CoV-2 by FDA under an Emergency Use Authorization (EUA). This EUA will remain in effect (meaning this test can be used) for the duration of the COVID-19 declaration under Section 564(b)(1) of the Act, 21 U.S.C. section 360bbb-3(b)(1), unless the authorization is terminated or revoked.  Performed at [facility], [address] [place], [place] [address]   CBG monitoring, ED     Status: Abnormal   Collection Time: 08/10/23  2:08 AM  Result Value Ref Range   Glucose-Capillary >600 (HH) 70 - 99 mg/dL    Comment: Glucose reference range applies only to samples taken after fasting for at least 8 hours.   DG Chest 2 View Result Date: 08/09/2023 CLINICAL DATA:  Cough EXAM: CHEST - 2 VIEW COMPARISON:  Chest x-ray 07/26/2020 FINDINGS: The heart size and mediastinal contours are within normal limits. Both lungs are clear. The visualized skeletal structures are unremarkable. IMPRESSION: No active cardiopulmonary disease. Electronically Signed   By: Greig Pique M.D.   On: 08/09/2023 20:30  Pending Labs Unresulted Labs (From admission, onward)      Start     Ordered   08/10/23 0500  Basic metabolic panel  Tomorrow morning,   R        08/10/23 0210   08/10/23 0500  CBC with Differential/Platelet  Tomorrow morning,   R        08/10/23 0210            Vitals/Pain Today's Vitals   08/09/23 1952 08/09/23 2001 08/09/23 2308 08/10/23 0222  BP:   (!) 172/71   Pulse:   80   Resp:   18   Temp:   (!) 97.5 F (36.4 C) 97.9 F (36.6 C)  TempSrc:   Oral Oral  SpO2:   96%   Weight: 235 lb (106.6 kg)     Height: 5' 8 (1.727 m)     PainSc:  0-No pain      Isolation Precautions No active isolations  Medications Medications  nystatin  (MYCOSTATIN ) 100000 UNIT/ML suspension 500,000 Units (has no administration in time range)  insulin  regular, human (MYXREDLIN ) 100 units/ 100 mL infusion (16.2 Units/hr Intravenous New Bag/Given 08/10/23 0203)  lactated ringers  infusion ( Intravenous New Bag/Given 08/10/23 0130)  dextrose  5 % in lactated ringers  infusion (0 mLs Intravenous Hold 08/10/23 0144)  dextrose  50 % solution 0-50 mL (has no administration in time range)  heparin  injection 5,000 Units (has no administration in time range)  acetaminophen  (TYLENOL ) tablet 650 mg (has no administration in time range)    Or  acetaminophen  (TYLENOL ) suppository 650 mg (has no administration in time range)  senna-docusate (Senokot-S) tablet 1 tablet (has no administration in time range)  ondansetron  (ZOFRAN ) tablet 4 mg (has no administration in time range)    Or  ondansetron  (ZOFRAN ) injection 4 mg (has no administration in time range)  lactated ringers  bolus 2,000 mL (2,000 mLs Intravenous New Bag/Given 08/10/23 0101)  nystatin  (MYCOSTATIN /NYSTOP ) topical powder ( Topical Given 08/10/23 0216)  fluconazole  (DIFLUCAN ) tablet 150 mg (150 mg Oral Given 08/10/23 0056)    Mobility walks

## 2023-08-10 NOTE — ED Provider Notes (Signed)
 [place] EMERGENCY DEPARTMENT AT [facility] Provider Note   CSN: [id number] Arrival date & time: 08/09/23  1939     History  Chief Complaint  Patient presents with   Penis Problem   Cough    Peter Manning is a 68 y.o. male.  68 yo M with a chief complaints of polydipsia polyuria and a rash to his penis as well as to his tongue.  This been going on for about a week or so.  Also has been coughing and having subjective fevers.  Denies shortness of breath.  Denies nausea or vomiting.   Cough      Home Medications Prior to Admission medications   Medication Sig Start Date End Date Taking? Authorizing Provider  etodolac (LODINE XL) 400 MG 24 hr tablet Take 400 mg by mouth daily. 06/07/23  Yes [provider]  finasteride  (PROSCAR ) 5 MG tablet Take 5 mg by mouth daily. 06/05/23  Yes [provider]  oxyCODONE  (ROXICODONE ) 15 MG immediate release tablet Take 15 mg by mouth 4 (four) times daily as needed. 08/01/23  Yes [provider]  rosuvastatin  (CRESTOR ) 10 MG tablet Take 10 mg by mouth daily. 06/10/23  Yes [provider]  albuterol  (PROVENTIL  HFA;VENTOLIN  HFA) 108 (90 Base) MCG/ACT inhaler Inhale 2 puffs into the lungs every 4 (four) hours as needed for wheezing or shortness of breath. 07/19/17   Dasie Faden, MD  amLODipine  (NORVASC ) 10 MG tablet Take 10 mg by mouth daily.  08/24/16   [provider]  atorvastatin  (LIPITOR) 40 MG tablet Take 40 mg by mouth at bedtime. 10/10/19   [provider]  blood glucose meter kit and supplies KIT Dispense based on patient and insurance preference. Use up to four times daily as directed. (FOR ICD-9 250.00, 250.01). 08/29/18   Odell Celinda Balo, MD  diazepam  (VALIUM ) 5 MG tablet Take 0.5 tablets (2.5 mg total) by mouth every 8 (eight) hours as needed for anxiety (spasms). 07/26/20   Mesner, Selinda, MD  diclofenac  (VOLTAREN ) 75 MG EC tablet Take 1 tablet (75 mg total) by mouth 2 (two)  times daily. 03/16/22   Sofia, Leslie K, PA-C  diclofenac  Sodium (VOLTAREN ) 1 % GEL Apply 4 g topically 4 (four) times daily. 01/07/22   Petrucelli, Samantha R, PA-C  doxycycline  (VIBRAMYCIN ) 100 MG capsule Take 1 capsule (100 mg total) by mouth 2 (two) times daily. 01/07/22   Petrucelli, Samantha R, PA-C  gabapentin  (NEURONTIN ) 800 MG tablet Take 800 mg by mouth 3 (three) times daily. 10/11/19   [provider]  hydrOXYzine  (ATARAX /VISTARIL ) 50 MG tablet Take 50 mg by mouth every 8 (eight) hours as needed for anxiety.  07/10/18   [provider]  insulin  aspart (NOVOLOG  PENFILL) cartridge Inject 3 Units into the skin 3 (three) times daily with meals. 08/29/18   Odell Celinda Balo, MD  Insulin  Detemir (LEVEMIR ) 100 UNIT/ML Pen Inject 30 Units into the skin daily. 08/29/18   Odell Celinda Balo, MD  Insulin  Pen Needle 31G X 6 MM MISC 1 Device by Does not apply route 2 (two) times daily. 08/29/18   Odell Celinda Balo, MD  lidocaine  (LIDODERM ) 5 % Place 1 patch onto the skin daily. Remove & Discard patch within 12 hours or as directed by MD 04/10/21   Horton, Roxie HERO, DO  metFORMIN  (GLUCOPHAGE ) 500 MG tablet Take 1 tablet (500 mg total) by mouth 2 (two) times daily with a meal. 08/29/18 11/02/28  Odell Celinda Balo, MD  methocarbamol  (  ROBAXIN ) 500 MG tablet Take 1 tablet (500 mg total) by mouth 2 (two) times daily. 07/28/20   Neldon Hamp RAMAN, PA  methylPREDNISolone  (MEDROL  DOSEPAK) 4 MG TBPK tablet Take as directed 04/10/21   Horton, Kristie M, DO  naproxen  (NAPROSYN ) 375 MG tablet Take 1 tablet (375 mg total) by mouth 2 (two) times daily. 03/20/21   Randol Simmonds, MD  ondansetron  (ZOFRAN -ODT) 4 MG disintegrating tablet 4mg  ODT q4 hours prn nausea/vomit 07/22/22   Pollina, Lonni PARAS, MD  oxyCODONE -acetaminophen  (PERCOCET) 10-325 MG tablet Take 1 tablet by mouth 4 (four) times daily.    [provider]  potassium chloride  SA (KLOR-CON  M) 20 MEQ tablet Take 1 tablet (20 mEq total) by  mouth daily. 01/07/22   Petrucelli, Samantha R, PA-C  SYMBICORT 160-4.5 MCG/ACT inhaler Inhale 2 puffs into the lungs daily as needed (shortness of breath).  08/05/18   [provider]  traZODone  (DESYREL ) 50 MG tablet Take 50 mg by mouth at bedtime as needed for sleep.  08/21/18   [provider]  Vitamin D, Ergocalciferol, (DRISDOL) 1.25 MG (50000 UNIT) CAPS capsule Take 50,000 Units by mouth every Monday. 10/07/19   [provider]      Allergies    Patient has no known allergies.    Review of Systems   Review of Systems  Respiratory:  Positive for cough.     Physical Exam Updated Vital Signs BP (!) 172/71 (BP Location: Left Arm)   Pulse 80   Temp 97.9 F (36.6 C) (Oral)   Resp 18   Ht 5' 8 (1.727 m)   Wt 106.6 kg   SpO2 96%   BMI 35.73 kg/m  Physical Exam Vitals and nursing note reviewed.  Constitutional:      Appearance: He is well-developed.  HENT:     Head: Normocephalic and atraumatic.     Mouth/Throat:     Comments: Discoloration of the tongue Eyes:     Pupils: Pupils are equal, round, and reactive to light.  Neck:     Vascular: No JVD.  Cardiovascular:     Rate and Rhythm: Normal rate and regular rhythm.     Heart sounds: No murmur heard.    No friction rub. No gallop.  Pulmonary:     Effort: No respiratory distress.     Breath sounds: No wheezing.  Abdominal:     General: There is no distension.     Tenderness: There is no abdominal tenderness. There is no guarding or rebound.  Genitourinary:    Comments: Patient has what appears to be yeast about the foreskin to the penis.  He does have a small break in the skin along the dorsal aspect just proximal to the head. Musculoskeletal:        General: Normal range of motion.     Cervical back: Normal range of motion and neck supple.  Skin:    Coloration: Skin is not pale.     Findings: No rash.  Neurological:     Mental Status: He is alert and oriented to person, place, and time.   Psychiatric:        Behavior: Behavior normal.     ED Results / Procedures / Treatments   Labs (all labs ordered are listed, but only abnormal results are displayed) Labs Reviewed  URINALYSIS, ROUTINE W REFLEX MICROSCOPIC - Abnormal; Notable for the following components:      Result Value   Color, Urine STRAW (*)    Glucose, UA >=500 (*)  Hgb urine dipstick SMALL (*)    All other components within normal limits  CBC WITH DIFFERENTIAL/PLATELET - Abnormal; Notable for the following components:   Neutro Abs 8.0 (*)    All other components within normal limits  BASIC METABOLIC PANEL - Abnormal; Notable for the following components:   Sodium 120 (*)    Chloride 86 (*)    CO2 21 (*)    Glucose, Bld 909 (*)    BUN 25 (*)    Creatinine, Ser 1.48 (*)    GFR, Estimated 52 (*)    All other components within normal limits  CBG MONITORING, ED - Abnormal; Notable for the following components:   Glucose-Capillary >600 (*)    All other components within normal limits  CBG MONITORING, ED - Abnormal; Notable for the following components:   Glucose-Capillary >600 (*)    All other components within normal limits  CBG MONITORING, ED - Abnormal; Notable for the following components:   Glucose-Capillary >600 (*)    All other components within normal limits  RESP PANEL BY RT-PCR (RSV, FLU A&B, COVID)  [id number]  BASIC METABOLIC PANEL  CBC WITH DIFFERENTIAL/PLATELET  GC/CHLAMYDIA PROBE AMP ([place]) NOT AT [facility]    EKG None  Radiology DG Chest 2 View Result Date: 08/09/2023 CLINICAL DATA:  Cough EXAM: CHEST - 2 VIEW COMPARISON:  Chest x-ray 07/26/2020 FINDINGS: The heart size and mediastinal contours are within normal limits. Both lungs are clear. The visualized skeletal structures are unremarkable. IMPRESSION: No active cardiopulmonary disease. Electronically Signed   By: Greig Pique M.D.   On: 08/09/2023 20:30    Procedures .Critical Care  Performed by: Emil Share, DO Authorized  by: Emil Share, DO   Critical care provider statement:    Critical care time (minutes):  35   Critical care time was exclusive of:  Separately billable procedures and treating other patients   Critical care was time spent personally by me on the following activities:  Development of treatment plan with patient or surrogate, discussions with consultants, evaluation of patient's response to treatment, examination of patient, ordering and review of laboratory studies, ordering and review of radiographic studies, ordering and performing treatments and interventions, pulse oximetry, re-evaluation of patient's condition and review of old charts   Care discussed with: admitting provider       Medications Ordered in ED Medications  nystatin  (MYCOSTATIN ) 100000 UNIT/ML suspension 500,000 Units (has no administration in time range)  insulin  regular, human (MYXREDLIN ) 100 units/ 100 mL infusion (16.2 Units/hr Intravenous New Bag/Given 08/10/23 0203)  lactated ringers  infusion ( Intravenous New Bag/Given 08/10/23 0130)  dextrose  5 % in lactated ringers  infusion (0 mLs Intravenous Hold 08/10/23 0144)  dextrose  50 % solution 0-50 mL (has no administration in time range)  heparin  injection 5,000 Units (has no administration in time range)  acetaminophen  (TYLENOL ) tablet 650 mg (has no administration in time range)    Or  acetaminophen  (TYLENOL ) suppository 650 mg (has no administration in time range)  senna-docusate (Senokot-S) tablet 1 tablet (has no administration in time range)  ondansetron  (ZOFRAN ) tablet 4 mg (has no administration in time range)    Or  ondansetron  (ZOFRAN ) injection 4 mg (has no administration in time range)  lactated ringers  bolus 2,000 mL (2,000 mLs Intravenous New Bag/Given 08/10/23 0101)  nystatin  (MYCOSTATIN /NYSTOP ) topical powder ( Topical Given 08/10/23 0216)  fluconazole  (DIFLUCAN ) tablet 150 mg (150 mg Oral Given 08/10/23 0056)    ED Course/ Medical Decision Making/ A&P  Medical Decision Making Amount and/or Complexity of Data Reviewed Labs: ordered. Radiology: ordered.  Risk Prescription drug management. Decision regarding hospitalization.   69 yo M with a chief complaints of polyuria polydipsia rash to the penis and tongue and cough and fever.  Patient hyperglycemic here.  Has not been checking his blood sugars at home.  Rash consistent with fungal etiology.  Awaiting for BMP.  Will give fluids.  Patient wants blood sugars 909.  Pseudohyponatremia.  AKI.  Will discuss with medicine.  The patients results and plan were reviewed and discussed.   Any x-rays performed were independently reviewed by myself.   Differential diagnosis were considered with the presenting HPI.  Medications  nystatin  (MYCOSTATIN ) 100000 UNIT/ML suspension 500,000 Units (has no administration in time range)  insulin  regular, human (MYXREDLIN ) 100 units/ 100 mL infusion (16.2 Units/hr Intravenous New Bag/Given 08/10/23 0203)  lactated ringers  infusion ( Intravenous New Bag/Given 08/10/23 0130)  dextrose  5 % in lactated ringers  infusion (0 mLs Intravenous Hold 08/10/23 0144)  dextrose  50 % solution 0-50 mL (has no administration in time range)  heparin  injection 5,000 Units (has no administration in time range)  acetaminophen  (TYLENOL ) tablet 650 mg (has no administration in time range)    Or  acetaminophen  (TYLENOL ) suppository 650 mg (has no administration in time range)  senna-docusate (Senokot-S) tablet 1 tablet (has no administration in time range)  ondansetron  (ZOFRAN ) tablet 4 mg (has no administration in time range)    Or  ondansetron  (ZOFRAN ) injection 4 mg (has no administration in time range)  lactated ringers  bolus 2,000 mL (2,000 mLs Intravenous New Bag/Given 08/10/23 0101)  nystatin  (MYCOSTATIN /NYSTOP ) topical powder ( Topical Given 08/10/23 0216)  fluconazole  (DIFLUCAN ) tablet 150 mg (150 mg Oral Given 08/10/23 0056)    Vitals:   08/09/23 1951  08/09/23 1952 08/09/23 2308 08/10/23 0222  BP: (!) 143/72  (!) 172/71   Pulse: 96  80   Resp: 20  18   Temp: 97.6 F (36.4 C)  (!) 97.5 F (36.4 C) 97.9 F (36.6 C)  TempSrc: Oral  Oral Oral  SpO2: 96%  96%   Weight:  106.6 kg    Height:  5' 8 (1.727 m)      Final diagnoses:  Hyperosmolar hyperglycemic state (HHS) (HCC)    Admission/ observation were discussed with the admitting physician, patient and/or family and they are comfortable with the plan.          Final Clinical Impression(s) / ED Diagnoses Final diagnoses:  Hyperosmolar hyperglycemic state (HHS) [facility])    Rx / DC Orders ED Discharge Orders     None         Emil Share, DO 08/10/23 0227

## 2023-08-10 NOTE — ED Notes (Addendum)
 Nurse spoke to [facility] regarding downtine Endotool sheet. Nurse got 16.2units for initial dose (600-60=540x0.03=16.2units). Peter Manning stated he is not familiar with the sheet but if pt BG is significantly 16units may be appropriate. Nurse initiated 1st dose. 2nd check is due at 3am.

## 2023-08-11 DIAGNOSIS — E1100 Type 2 diabetes mellitus with hyperosmolarity without nonketotic hyperglycemic-hyperosmolar coma (NKHHC): Secondary | ICD-10-CM | POA: Diagnosis not present

## 2023-08-11 LAB — CBC
HCT: 44.1 % (ref 39.0–52.0)
Hemoglobin: 14.5 g/dL (ref 13.0–17.0)
MCH: 31.3 pg (ref 26.0–34.0)
MCHC: 32.9 g/dL (ref 30.0–36.0)
MCV: 95.2 fL (ref 80.0–100.0)
Platelets: 183 10*3/uL (ref 150–400)
RBC: 4.63 MIL/uL (ref 4.22–5.81)
RDW: 11.8 % (ref 11.5–15.5)
WBC: 7.2 10*3/uL (ref 4.0–10.5)
nRBC: 0 % (ref 0.0–0.2)

## 2023-08-11 LAB — BASIC METABOLIC PANEL
Anion gap: 10 (ref 5–15)
BUN: 16 mg/dL (ref 8–23)
CO2: 25 mmol/L (ref 22–32)
Calcium: 9.8 mg/dL (ref 8.9–10.3)
Chloride: 101 mmol/L (ref 98–111)
Creatinine, Ser: 0.97 mg/dL (ref 0.61–1.24)
GFR, Estimated: >60 mL/min (ref >60)
Glucose, Bld: 262 mg/dL — ABNORMAL HIGH (ref 70–99)
Potassium: 3.5 mmol/L (ref 3.5–5.1)
Sodium: 136 mmol/L (ref 135–145)

## 2023-08-11 LAB — GLUCOSE, CAPILLARY
Glucose-Capillary: 306 mg/dL — ABNORMAL HIGH (ref 70–99)
Glucose-Capillary: 299 mg/dL — ABNORMAL HIGH (ref 70–99)
Glucose-Capillary: 378 mg/dL — ABNORMAL HIGH (ref 70–99)
Glucose-Capillary: 390 mg/dL — ABNORMAL HIGH (ref 70–99)

## 2023-08-11 LAB — MAGNESIUM: Magnesium: 1.8 mg/dL (ref 1.7–2.4)

## 2023-08-11 LAB — PHOSPHORUS: Phosphorus: 2.8 mg/dL (ref 2.5–4.6)

## 2023-08-11 MED ORDER — INSULIN GLARGINE-YFGN 100 UNIT/ML ~~LOC~~ SOLN
5.0000 [IU] | Freq: Once | SUBCUTANEOUS | Status: AC
  Administered 2023-08-11: 5 [IU] via SUBCUTANEOUS
  Filled 2023-08-11: qty 0.05

## 2023-08-11 MED ORDER — INSULIN GLARGINE-YFGN 100 UNIT/ML ~~LOC~~ SOLN
35.0000 [IU] | Freq: Every day | SUBCUTANEOUS | Status: DC
  Filled 2023-08-11: qty 0.35

## 2023-08-11 NOTE — Plan of Care (Signed)

## 2023-08-11 NOTE — Progress Notes (Signed)
 PROGRESS NOTE    Peter Manning  FMW:[id number] DOB: 10/03/1955 DOA: 08/09/2023 PCP: Leron Millman, NP    Brief Narrative:  68 year old with history of DM2, HTN, HLD, COPD, tobacco use, anxiety/depression, chronic pain ran out of insulin  about a week ago admitted for hyperglycemia.  No evidence of acidosis at this time.     Assessment & Plan:  Principal Problem:   Hyperosmolar hyperglycemic state (HHS) (HCC)   Hyperosmolar hyperglycemic state Insulin -dependent diabetes mellitus type 2 - No evidence of acidosis.   -Transitioned off insulin  drip yesterday onto his home Lantus  of 30 units.   -Also on sliding scale.   -CBGs are better however still in the 300s.  Will increase Lantus  today to 35 units.     Uncontrolled hypertension - Outpatient issue -Blood pressures have been lower here after resumption of his home Norvasc .  IV as needed.   Oral thrush and Candida under skin - Daily Diflucan  started.  Will complete total 7-day course   COPD Tobacco use -Bronchodilators.   Chronic pain - Has a pain stimulator in place   OSA -Bedtime CPAP   BPH -Proscar    HLD -Lipitor   Anxiety/depression -Supportive care.   Hypokalemia - As needed repletion     DVT prophylaxis: heparin  injection 5,000 Units Start: 08/10/23 0600    Code Status: Full Code Family Communication:   Status is: Inpatient Remains inpatient appropriate because: Seen at bedside, no other complaints.  Okay to transfer him to MedSurg.  Likely able to DC home tomorrow 2/8 if CBGs continue to drop   Subjective: Being okay no other complaints at this time.  Tells me he ran out of his medications and he did not get it refilled by his PCP     Examination:   General exam: Appears calm and comfortable  Respiratory system: Clear to auscultation. Respiratory effort normal. Cardiovascular system: S1 & S2 heard, RRR. No JVD, murmurs, rubs, gallops or clicks. No pedal edema. Gastrointestinal system: Abdomen is  nondistended, soft and nontender. No organomegaly or masses felt. Normal bowel sounds heard. Central nervous system: Alert and oriented. No focal neurological deficits. Extremities: Symmetric 5 x 5 power. Skin: No rashes, lesions or ulcers Psychiatry: Judgement and insight appear normal. Mood & affect appropriate.        Diet Orders (From admission, onward)     Start     Ordered   08/10/23 0209  Diet heart healthy/carb modified Room service appropriate? Yes; Fluid consistency: Thin  Diet effective now       Question Answer Comment  Diet-HS Snack? Nothing   Room service appropriate? Yes   Fluid consistency: Thin      08/10/23 0210            Objective: Vitals:   08/11/23 0300 08/11/23 0400 08/11/23 0500 08/11/23 0800  BP: 112/73 119/71 115/70   Pulse: 70 68 72   Resp: 14 16 15    Temp:    98.4 F (36.9 C)  TempSrc:    Oral  SpO2: (!) 87%  (!) 85%   Weight:      Height:        Intake/Output Summary (Last 24 hours) at 08/11/2023 1425 Last data filed at 08/11/2023 0900 Gross per 24 hour  Intake 681.74 ml  Output 1525 ml  Net -843.26 ml   Filed Weights   08/09/23 1952 08/10/23 0350  Weight: 106.6 kg 106 kg    Scheduled Meds:  amLODipine   10 mg Oral Daily   Chlorhexidine  Gluconate  Cloth  6 each Topical Daily   feeding supplement  237 mL Oral BID BM   finasteride   5 mg Oral Daily   fluconazole   100 mg Oral Daily   fluticasone  furoate-vilanterol  1 puff Inhalation Daily   gabapentin   800 mg Oral TID   heparin   5,000 Units Subcutaneous Q8H   insulin  aspart  0-5 Units Subcutaneous QHS   insulin  aspart  0-9 Units Subcutaneous TID WC   insulin  glargine-yfgn  30 Units Subcutaneous Daily   nicotine   7 mg Transdermal Daily   nystatin   5 mL Oral QID   rosuvastatin   10 mg Oral Daily   Continuous Infusions:  insulin  Stopped (08/10/23 1532)    Nutritional status     Body mass index is 35.53 kg/m.  Data Reviewed:   CBC: Recent Labs  Lab 08/09/23 2142  08/11/23 0309  WBC 10.5 7.2  NEUTROABS 8.0*  --   HGB 17.0 14.5  HCT 50.2 44.1  MCV 95.1 95.2  PLT 228 183   Basic Metabolic Panel: Recent Labs  Lab 08/10/23 0104 08/10/23 0841 08/11/23 0309  NA 120* 135 136  K 4.7 3.4* 3.5  CL 86* 100 101  CO2 21* 26 25  GLUCOSE 909* 223* 262*  BUN 25* 17 16  CREATININE 1.48* 0.98 0.97  CALCIUM  9.9 10.2 9.8  MG  --   --  1.8  PHOS  --   --  2.8   GFR: Estimated Creatinine Clearance: 87.2 mL/min (by C-G formula based on SCr of 0.97 mg/dL). Liver Function Tests: No results for input(s): AST, ALT, ALKPHOS, BILITOT, PROT, ALBUMIN in the last 168 hours. No results for input(s): LIPASE, AMYLASE in the last 168 hours. No results for input(s): AMMONIA in the last 168 hours. Coagulation Profile: No results for input(s): INR, PROTIME in the last 168 hours. Cardiac Enzymes: No results for input(s): CKTOTAL, CKMB, CKMBINDEX, TROPONINI in the last 168 hours. BNP (last 3 results) No results for input(s): PROBNP in the last 8760 hours. HbA1C: Recent Labs    08/10/23 0841  HGBA1C 10.8*   CBG: Recent Labs  Lab 08/10/23 1535 08/10/23 1707 08/10/23 2125 08/11/23 0751 08/11/23 1156  GLUCAP 397* 393* 306* 306* 299*   Lipid Profile: No results for input(s): CHOL, HDL, LDLCALC, TRIG, CHOLHDL, LDLDIRECT in the last 72 hours. Thyroid Function Tests: No results for input(s): TSH, T4TOTAL, FREET4, T3FREE, THYROIDAB in the last 72 hours. Anemia Panel: No results for input(s): VITAMINB12, FOLATE, FERRITIN, TIBC, IRON, RETICCTPCT in the last 72 hours. Sepsis Labs: No results for input(s): PROCALCITON, LATICACIDVEN in the last 168 hours.  Recent Results (from the past 240 hours)  Resp panel by RT-PCR (RSV, Flu A&B, Covid) Anterior Nasal Swab     Status: None   Collection Time: 08/10/23  1:13 AM   Specimen: Anterior Nasal Swab  Result Value Ref Range Status   SARS Coronavirus 2  by RT PCR NEGATIVE NEGATIVE Final    Comment: (NOTE) SARS-CoV-2 target nucleic acids are NOT DETECTED.  The SARS-CoV-2 RNA is generally detectable in upper respiratory specimens during the acute phase of infection. The lowest concentration of SARS-CoV-2 viral copies this assay can detect is 138 copies/mL. A negative result does not preclude SARS-Cov-2 infection and should not be used as the sole basis for treatment or other patient management decisions. A negative result may occur with  improper specimen collection/handling, submission of specimen other than nasopharyngeal swab, presence of viral mutation(s) within the areas targeted by this assay, and  inadequate number of viral copies(<138 copies/mL). A negative result must be combined with clinical observations, patient history, and epidemiological information. The expected result is Negative.  Fact Sheet for Patients:  bloggercourse.com  Fact Sheet for Healthcare Providers:  seriousbroker.it  This test is no t yet approved or cleared by the [place]  FDA and  has been authorized for detection and/or diagnosis of SARS-CoV-2 by FDA under an Emergency Use Authorization (EUA). This EUA will remain  in effect (meaning this test can be used) for the duration of the COVID-19 declaration under Section 564(b)(1) of the Act, 21 U.S.C.section 360bbb-3(b)(1), unless the authorization is terminated  or revoked sooner.       Influenza A by PCR NEGATIVE NEGATIVE Final   Influenza B by PCR NEGATIVE NEGATIVE Final    Comment: (NOTE) The Xpert Xpress SARS-CoV-2/FLU/RSV plus assay is intended as an aid in the diagnosis of influenza from Nasopharyngeal swab specimens and should not be used as a sole basis for treatment. Nasal washings and aspirates are unacceptable for Xpert Xpress SARS-CoV-2/FLU/RSV testing.  Fact Sheet for Patients: bloggercourse.com  Fact  Sheet for Healthcare Providers: seriousbroker.it  This test is not yet approved or cleared by the [place]  FDA and has been authorized for detection and/or diagnosis of SARS-CoV-2 by FDA under an Emergency Use Authorization (EUA). This EUA will remain in effect (meaning this test can be used) for the duration of the COVID-19 declaration under Section 564(b)(1) of the Act, 21 U.S.C. section 360bbb-3(b)(1), unless the authorization is terminated or revoked.     Resp Syncytial Virus by PCR NEGATIVE NEGATIVE Final    Comment: (NOTE) Fact Sheet for Patients: bloggercourse.com  Fact Sheet for Healthcare Providers: seriousbroker.it  This test is not yet approved or cleared by the [place]  FDA and has been authorized for detection and/or diagnosis of SARS-CoV-2 by FDA under an Emergency Use Authorization (EUA). This EUA will remain in effect (meaning this test can be used) for the duration of the COVID-19 declaration under Section 564(b)(1) of the Act, 21 U.S.C. section 360bbb-3(b)(1), unless the authorization is terminated or revoked.  Performed at [facility], [address], [place], [place] [address]   MRSA Next Gen by PCR, Nasal     Status: None   Collection Time: 08/10/23  8:00 AM   Specimen: Nasal Mucosa; Nasal Swab  Result Value Ref Range Status   MRSA by PCR Next Gen NOT DETECTED NOT DETECTED Final    Comment: (NOTE) The GeneXpert MRSA Assay (FDA approved for NASAL specimens only), is one component of a comprehensive MRSA colonization surveillance program. It is not intended to diagnose MRSA infection nor to guide or monitor treatment for MRSA infections. Test performance is not FDA approved in patients less than 25 years old. Performed at [facility], [address] [place], [place] [address]          Radiology Studies: DG Chest 2  View Result Date: 08/09/2023 CLINICAL DATA:  Cough EXAM: CHEST - 2 VIEW COMPARISON:  Chest x-ray 07/26/2020 FINDINGS: The heart size and mediastinal contours are within normal limits. Both lungs are clear. The visualized skeletal structures are unremarkable. IMPRESSION: No active cardiopulmonary disease. Electronically Signed   By: Greig Pique M.D.   On: 08/09/2023 20:30           LOS: 1 day   Time spent= 35 mins    Reyes VEAR Gaw, MD [facility]  If 7PM-7AM, please contact night-coverage  08/11/2023, 2:25 PM

## 2023-08-11 NOTE — Progress Notes (Signed)
   08/11/23 2240  BiPAP/CPAP/SIPAP  BiPAP/CPAP/SIPAP Pt Type Adult  BiPAP/CPAP/SIPAP DREAMSTATIOND  Mask Type Full face mask  Mask Size Large  Respiratory Rate 18 breaths/min  FiO2 (%) 21 %  Patient Home Equipment No  Auto Titrate Yes (auto 20/5)  CPAP/SIPAP surface wiped down Yes  BiPAP/CPAP /SiPAP Vitals  Pulse Rate 74  Resp 18  SpO2 94 %  Bilateral Breath Sounds Diminished  MEWS Score/Color  MEWS Score 0  MEWS Score Color Landy

## 2023-08-12 DIAGNOSIS — E1100 Type 2 diabetes mellitus with hyperosmolarity without nonketotic hyperglycemic-hyperosmolar coma (NKHHC): Secondary | ICD-10-CM | POA: Diagnosis not present

## 2023-08-12 LAB — GLUCOSE, CAPILLARY
Glucose-Capillary: 294 mg/dL — ABNORMAL HIGH (ref 70–99)
Glucose-Capillary: 299 mg/dL — ABNORMAL HIGH (ref 70–99)
Glucose-Capillary: 383 mg/dL — ABNORMAL HIGH (ref 70–99)
Glucose-Capillary: 418 mg/dL — ABNORMAL HIGH (ref 70–99)

## 2023-08-12 LAB — BASIC METABOLIC PANEL
Anion gap: 9 (ref 5–15)
BUN: 16 mg/dL (ref 8–23)
CO2: 25 mmol/L (ref 22–32)
Calcium: 9.7 mg/dL (ref 8.9–10.3)
Chloride: 99 mmol/L (ref 98–111)
Creatinine, Ser: 0.99 mg/dL (ref 0.61–1.24)
GFR, Estimated: >60 mL/min (ref >60)
Glucose, Bld: 316 mg/dL — ABNORMAL HIGH (ref 70–99)
Potassium: 3.6 mmol/L (ref 3.5–5.1)
Sodium: 133 mmol/L — ABNORMAL LOW (ref 135–145)

## 2023-08-12 MED ORDER — INSULIN GLARGINE-YFGN 100 UNIT/ML ~~LOC~~ SOLN
40.0000 [IU] | Freq: Every day | SUBCUTANEOUS | Status: DC
  Administered 2023-08-12: 40 [IU] via SUBCUTANEOUS
  Filled 2023-08-12 (×2): qty 0.4

## 2023-08-12 MED ORDER — INSULIN ASPART 100 UNIT/ML IJ SOLN
6.0000 [IU] | Freq: Once | INTRAMUSCULAR | Status: AC
  Administered 2023-08-12: 6 [IU] via SUBCUTANEOUS

## 2023-08-12 NOTE — Progress Notes (Signed)
 PROGRESS NOTE    Peter Manning  FMW:[id number] DOB: Aug 15, 1955 DOA: 08/09/2023 PCP: Leron Millman, NP    Brief Narrative:  68 year old with history of DM2, HTN, HLD, COPD, tobacco use, anxiety/depression, chronic pain ran out of insulin  about a week ago admitted for hyperglycemia.  No evidence of acidosis at this time.  Started on insulin  drip, transitioned off and now on increased long-acting insulin  from home doses.      Assessment & Plan:  Principal Problem:   Hyperosmolar hyperglycemic state (HHS) (HCC)   Hyperosmolar hyperglycemic state Insulin -dependent diabetes mellitus type 2 - No evidence of acidosis.   -Transitioned off insulin  drip 2/6 onto his home Lantus  of 30 units.  However does continue to have increasing long-acting needs and is now on 40 units. -Also on sliding scale.    Uncontrolled hypertension - Outpatient issue -Blood pressures have been lower here after resumption of his home Norvasc .  IV as needed.   Oral thrush and Candida under skin - Daily Diflucan  started.  S/p 3 doses.  Will stop at this point as generally it's a 1 time dose and repeated if necessary - presume this will continue to improve as we get his blood sugars under better control.   COPD Tobacco use -Bronchodilators.   Chronic pain - Has a pain stimulator in place   OSA -Bedtime CPAP   BPH -Proscar    HLD -Lipitor   Anxiety/depression -Supportive care.   Hypokalemia - As needed repletion     DVT prophylaxis: heparin  injection 5,000 Units Start: 08/10/23 0600    Code Status: Full Code Family Communication:   Status is: Inpatient Remains inpatient appropriate because: Seen at bedside, no other complaints.  Okay to transfer him to MedSurg.  Hopeful for DC home soon once we get his blood sugars under better control.   Subjective: Doing well no other complaints at this time.  Tells me he ran out of his medications and he did not get it refilled by his PCP.  Eating and drinking  well.  No complaints.     Examination:   General exam: Appears calm and comfortable  Respiratory system: Clear to auscultation. Respiratory effort normal. Cardiovascular system: S1 & S2 heard, RRR. No JVD, murmurs, rubs, gallops or clicks. No pedal edema. Gastrointestinal system: Abdomen is nondistended, soft and nontender. No organomegaly or masses felt. Normal bowel sounds heard. Central nervous system: Alert and oriented. No focal neurological deficits. Extremities: Symmetric 5 x 5 power. Skin: No rashes, lesions or ulcers Psychiatry: Judgement and insight appear normal. Mood & affect appropriate.        Diet Orders (From admission, onward)     Start     Ordered   08/10/23 0209  Diet heart healthy/carb modified Room service appropriate? Yes; Fluid consistency: Thin  Diet effective now       Question Answer Comment  Diet-HS Snack? Nothing   Room service appropriate? Yes   Fluid consistency: Thin      08/10/23 0210            Objective: Vitals:   08/12/23 1130 08/12/23 1200 08/12/23 1300 08/12/23 1332  BP:  132/73 (!) 141/76 118/75  Pulse:  68 77 79  Resp:  17 19   Temp: (!) 97.4 F (36.3 C)   98.1 F (36.7 C)  TempSrc: Axillary   Oral  SpO2:  (!) 89% 95% 91%  Weight:      Height:        Intake/Output Summary (Last 24 hours) at  08/12/2023 1548 Last data filed at 08/12/2023 1200 Gross per 24 hour  Intake --  Output 3225 ml  Net -3225 ml   Filed Weights   08/09/23 1952 08/10/23 0350  Weight: 106.6 kg 106 kg    Scheduled Meds:  amLODipine   10 mg Oral Daily   Chlorhexidine  Gluconate Cloth  6 each Topical Daily   feeding supplement  237 mL Oral BID BM   finasteride   5 mg Oral Daily   fluconazole   100 mg Oral Daily   fluticasone  furoate-vilanterol  1 puff Inhalation Daily   gabapentin   800 mg Oral TID   heparin   5,000 Units Subcutaneous Q8H   insulin  aspart  0-5 Units Subcutaneous QHS   insulin  aspart  0-9 Units Subcutaneous TID WC   insulin   glargine-yfgn  40 Units Subcutaneous Daily   nicotine   7 mg Transdermal Daily   nystatin   5 mL Oral QID   rosuvastatin   10 mg Oral Daily   Continuous Infusions:  insulin  Stopped (08/10/23 1532)    Nutritional status     Body mass index is 35.53 kg/m.  Data Reviewed:   CBC: Recent Labs  Lab 08/09/23 2142 08/11/23 0309  WBC 10.5 7.2  NEUTROABS 8.0*  --   HGB 17.0 14.5  HCT 50.2 44.1  MCV 95.1 95.2  PLT 228 183   Basic Metabolic Panel: Recent Labs  Lab 08/10/23 0104 08/10/23 0841 08/11/23 0309 08/12/23 0300  NA 120* 135 136 133*  K 4.7 3.4* 3.5 3.6  CL 86* 100 101 99  CO2 21* 26 25 25   GLUCOSE 909* 223* 262* 316*  BUN 25* 17 16 16   CREATININE 1.48* 0.98 0.97 0.99  CALCIUM  9.9 10.2 9.8 9.7  MG  --   --  1.8  --   PHOS  --   --  2.8  --    GFR: Estimated Creatinine Clearance: 85.4 mL/min (by C-G formula based on SCr of 0.99 mg/dL). Liver Function Tests: No results for input(s): AST, ALT, ALKPHOS, BILITOT, PROT, ALBUMIN in the last 168 hours. No results for input(s): LIPASE, AMYLASE in the last 168 hours. No results for input(s): AMMONIA in the last 168 hours. Coagulation Profile: No results for input(s): INR, PROTIME in the last 168 hours. Cardiac Enzymes: No results for input(s): CKTOTAL, CKMB, CKMBINDEX, TROPONINI in the last 168 hours. BNP (last 3 results) No results for input(s): PROBNP in the last 8760 hours. HbA1C: Recent Labs    08/10/23 0841  HGBA1C 10.8*   CBG: Recent Labs  Lab 08/11/23 1156 08/11/23 1602 08/11/23 2055 08/12/23 0811 08/12/23 1128  GLUCAP 299* 378* 390* 294* 299*   Lipid Profile: No results for input(s): CHOL, HDL, LDLCALC, TRIG, CHOLHDL, LDLDIRECT in the last 72 hours. Thyroid Function Tests: No results for input(s): TSH, T4TOTAL, FREET4, T3FREE, THYROIDAB in the last 72 hours. Anemia Panel: No results for input(s): VITAMINB12, FOLATE, FERRITIN, TIBC,  IRON, RETICCTPCT in the last 72 hours. Sepsis Labs: No results for input(s): PROCALCITON, LATICACIDVEN in the last 168 hours.  Recent Results (from the past 240 hours)  Resp panel by RT-PCR (RSV, Flu A&B, Covid) Anterior Nasal Swab     Status: None   Collection Time: 08/10/23  1:13 AM   Specimen: Anterior Nasal Swab  Result Value Ref [place] Status   SARS Coronavirus 2 by RT PCR NEGATIVE NEGATIVE Final    Comment: (NOTE) SARS-CoV-2 target nucleic acids are NOT DETECTED.  The SARS-CoV-2 RNA is generally detectable in upper respiratory specimens during the  acute phase of infection. The lowest concentration of SARS-CoV-2 viral copies this assay can detect is 138 copies/mL. A negative result does not preclude SARS-Cov-2 infection and should not be used as the sole basis for treatment or other patient management decisions. A negative result may occur with  improper specimen collection/handling, submission of specimen other than nasopharyngeal swab, presence of viral mutation(s) within the areas targeted by this assay, and inadequate number of viral copies(<138 copies/mL). A negative result must be combined with clinical observations, patient history, and epidemiological information. The expected result is Negative.  Fact Sheet for Patients:  bloggercourse.com  Fact Sheet for Healthcare Providers:  seriousbroker.it  This test is no t yet approved or cleared by the [place]  FDA and  has been authorized for detection and/or diagnosis of SARS-CoV-2 by FDA under an Emergency Use Authorization (EUA). This EUA will remain  in effect (meaning this test can be used) for the duration of the COVID-19 declaration under Section 564(b)(1) of the Act, 21 U.S.C.section 360bbb-3(b)(1), unless the authorization is terminated  or revoked sooner.       Influenza A by PCR NEGATIVE NEGATIVE Final   Influenza B by PCR NEGATIVE NEGATIVE Final     Comment: (NOTE) The Xpert Xpress SARS-CoV-2/FLU/RSV plus assay is intended as an aid in the diagnosis of influenza from Nasopharyngeal swab specimens and should not be used as a sole basis for treatment. Nasal washings and aspirates are unacceptable for Xpert Xpress SARS-CoV-2/FLU/RSV testing.  Fact Sheet for Patients: bloggercourse.com  Fact Sheet for Healthcare Providers: seriousbroker.it  This test is not yet approved or cleared by the [place]  FDA and has been authorized for detection and/or diagnosis of SARS-CoV-2 by FDA under an Emergency Use Authorization (EUA). This EUA will remain in effect (meaning this test can be used) for the duration of the COVID-19 declaration under Section 564(b)(1) of the Act, 21 U.S.C. section 360bbb-3(b)(1), unless the authorization is terminated or revoked.     Resp Syncytial Virus by PCR NEGATIVE NEGATIVE Final    Comment: (NOTE) Fact Sheet for Patients: bloggercourse.com  Fact Sheet for Healthcare Providers: seriousbroker.it  This test is not yet approved or cleared by the [place]  FDA and has been authorized for detection and/or diagnosis of SARS-CoV-2 by FDA under an Emergency Use Authorization (EUA). This EUA will remain in effect (meaning this test can be used) for the duration of the COVID-19 declaration under Section 564(b)(1) of the Act, 21 U.S.C. section 360bbb-3(b)(1), unless the authorization is terminated or revoked.  Performed at [facility], [address], [place], [place] [address]   MRSA Next Gen by PCR, Nasal     Status: None   Collection Time: 08/10/23  8:00 AM   Specimen: Nasal Mucosa; Nasal Swab  Result Value Ref Range Status   MRSA by PCR Next Gen NOT DETECTED NOT DETECTED Final    Comment: (NOTE) The GeneXpert MRSA Assay (FDA approved for NASAL specimens only), is one component  of a comprehensive MRSA colonization surveillance program. It is not intended to diagnose MRSA infection nor to guide or monitor treatment for MRSA infections. Test performance is not FDA approved in patients less than 44 years old. Performed at [facility], [address], [place], [place] [address]          Radiology Studies: No results found.     LOS: 2 days   Time spent= 35 mins  Reyes VEAR Gaw, MD [facility]  If 7PM-7AM, please contact  night-coverage  08/12/2023, 3:48 PM

## 2023-08-12 NOTE — Progress Notes (Signed)
 Received patient from the ICU. Aox4. Denies pain. Denies needs at this time. Patient orient to unit. Safety precautions maintained.   08/12/23 1332  Vitals  Temp 98.1 F (36.7 C)  Temp Source Oral  BP 118/75  MAP (mmHg) 88  BP Location Left Arm  BP Method Automatic  Patient Position (if appropriate) Sitting  Pulse Rate 79  Pulse Rate Source Monitor  Level of Consciousness  Level of Consciousness Alert  MEWS COLOR  MEWS Score Color Green  Oxygen Therapy  SpO2 91 %  O2 Device Room Air  Pain Assessment  Pain Scale 0-10  Pain Score 0  MEWS Score  MEWS Temp 0  MEWS Systolic 0  MEWS Pulse 0  MEWS RR 0  MEWS LOC 0  MEWS Score 0

## 2023-08-12 NOTE — Plan of Care (Signed)

## 2023-08-13 DIAGNOSIS — E1100 Type 2 diabetes mellitus with hyperosmolarity without nonketotic hyperglycemic-hyperosmolar coma (NKHHC): Secondary | ICD-10-CM | POA: Diagnosis not present

## 2023-08-13 LAB — BASIC METABOLIC PANEL
Anion gap: 10 (ref 5–15)
BUN: 16 mg/dL (ref 8–23)
CO2: 26 mmol/L (ref 22–32)
Calcium: 10 mg/dL (ref 8.9–10.3)
Chloride: 100 mmol/L (ref 98–111)
Creatinine, Ser: 1.1 mg/dL (ref 0.61–1.24)
GFR, Estimated: >60 mL/min (ref >60)
Glucose, Bld: 214 mg/dL — ABNORMAL HIGH (ref 70–99)
Potassium: 3.7 mmol/L (ref 3.5–5.1)
Sodium: 136 mmol/L (ref 135–145)

## 2023-08-13 LAB — GLUCOSE, CAPILLARY
Glucose-Capillary: 213 mg/dL — ABNORMAL HIGH (ref 70–99)
Glucose-Capillary: 295 mg/dL — ABNORMAL HIGH (ref 70–99)
Glucose-Capillary: 466 mg/dL — ABNORMAL HIGH (ref 70–99)

## 2023-08-13 MED ORDER — INSULIN ASPART 100 UNIT/ML IJ SOLN
6.0000 [IU] | Freq: Once | INTRAMUSCULAR | Status: AC
  Administered 2023-08-13: 6 [IU] via SUBCUTANEOUS

## 2023-08-13 MED ORDER — INSULIN GLARGINE-YFGN 100 UNIT/ML ~~LOC~~ SOLN
45.0000 [IU] | Freq: Every day | SUBCUTANEOUS | Status: DC
  Filled 2023-08-13: qty 0.45

## 2023-08-13 MED ORDER — INSULIN GLARGINE-YFGN 100 UNIT/ML ~~LOC~~ SOLN
40.0000 [IU] | Freq: Every day | SUBCUTANEOUS | Status: DC
  Administered 2023-08-13 – 2023-08-14 (×2): 40 [IU] via SUBCUTANEOUS
  Filled 2023-08-13 (×2): qty 0.4

## 2023-08-13 MED ORDER — INSULIN GLARGINE-YFGN 100 UNIT/ML ~~LOC~~ SOLN: 40.0000 [IU] | Freq: Every day | SUBCUTANEOUS | Status: DC

## 2023-08-13 NOTE — Plan of Care (Signed)

## 2023-08-13 NOTE — Progress Notes (Signed)
   08/13/23 0104  BiPAP/CPAP/SIPAP  $ Non-Invasive Home Ventilator  Initial  BiPAP/CPAP/SIPAP Pt Type Adult  BiPAP/CPAP/SIPAP Resmed  Mask Type Full face mask  Mask Size Large  Respiratory Rate 16 breaths/min  FiO2 (%) 21 %  Patient Home Equipment No  Auto Titrate Yes (8-20cmH20)

## 2023-08-13 NOTE — Progress Notes (Signed)
 PROGRESS NOTE    Peter Manning  FMW:[id number] DOB: 1956-02-22 DOA: 08/09/2023 PCP: Leron Millman, NP    Brief Narrative:  68 year old with history of DM2, HTN, HLD, COPD, tobacco use, anxiety/depression, chronic pain ran out of insulin  about a week ago admitted for hyperglycemia.  No evidence of acidosis at this time.  Started on insulin  drip, transitioned off and now on increased long-acting insulin  from home doses.      Assessment & Plan:  Principal Problem:   Hyperosmolar hyperglycemic state (HHS) (HCC)   Hyperosmolar hyperglycemic state Insulin -dependent diabetes mellitus type 2 -Reports that he became shaky and sweaty this morning when his blood sugars were measured around the 200s.  Felt better after eating breakfast. -Hospitalization and HHS triggered by lack of home insulin  as he ran out of his medications roughly a week or so before admission.  On home Levemir  of 30 units nightly.  As well as metformin . -A1C here 10.8, meaning avg CBG over past 3 months is 250s.   -Transitioned off insulin  drip 2/6 onto his home Lantus  of 30 units.  However does continue to have increasing long-acting needs and is now on 40 units as of 2/8. -For past 2 days he has required a little over 60 units of insulin  a day.  Two thirds of this this would be 44 units so increasing his long-acting insulin  to 40 units. -For some reason he did not get his 40 units of insulin  until Millie afternoon this afternoon.  His blood sugars have obviously therefore been elevated again. -Also on sliding scale.    Uncontrolled hypertension - Outpatient issue -Blood pressures have been lower here after resumption of his home Norvasc .  IV as needed.   Oral thrush and Candida under skin - Daily Diflucan  started.  S/p 3 doses.  Will stop at this point as generally it's a 1 time dose and repeated if necessary - presume this will continue to improve as we get his blood sugars under better control.   COPD Tobacco  use -Bronchodilators.   Chronic pain - Has a pain stimulator in place   OSA -Bedtime CPAP   BPH -Proscar    HLD -Lipitor   Anxiety/depression -Supportive care.   Hypokalemia - As needed repletion     DVT prophylaxis: heparin  injection 5,000 Units Start: 08/10/23 0600    Code Status: Full Code Family Communication: Wife on phone today while I visited with patient Status is: Inpatient Remains inpatient appropriate because: Seen at bedside, no other complaints.  Okay to transfer him to MedSurg.  Hopeful for DC home soon once we get his blood sugars under better control.   Subjective: Doing well no other complaints at this time.  Tells me he ran out of his medications and he did not get it refilled by his PCP.  Eating and drinking well.  No complaints.  Did have some shakiness and sweating when his blood sugars hit 200 this morning.  Sounds like this is relatively low for him.     Examination:   General exam: Appears calm and comfortable  Respiratory system: Clear to auscultation. Respiratory effort normal. Cardiovascular system: S1 & S2 heard, RRR. No JVD, murmurs, rubs, gallops or clicks. No pedal edema. Gastrointestinal system: Abdomen is nondistended, soft and nontender.  Central nervous system: Alert and oriented. No focal neurological deficits. Extremities: Symmetric 5 x 5 power. Skin: No rashes, lesions or ulcers Psychiatry: Judgement and insight appear normal. Mood & affect appropriate.    Diet Orders (From admission, onward)  Start     Ordered   08/10/23 0209  Diet heart healthy/carb modified Room service appropriate? Yes; Fluid consistency: Thin  Diet effective now       Question Answer Comment  Diet-HS Snack? Nothing   Room service appropriate? Yes   Fluid consistency: Thin      08/10/23 0210            Objective: Vitals:   08/12/23 1300 08/12/23 1332 08/12/23 2017 08/13/23 0503  BP: (!) 141/76 118/75 (!) 144/94 (!) 144/91  Pulse: 77 79 83 73   Resp: 19  15 15   Temp:  98.1 F (36.7 C) 98.1 F (36.7 C) 98 F (36.7 C)  TempSrc:  Oral    SpO2: 95% 91% 94% 96%  Weight:      Height:        Intake/Output Summary (Last 24 hours) at 08/13/2023 0816 Last data filed at 08/12/2023 1200 Gross per 24 hour  Intake --  Output 1225 ml  Net -1225 ml   Filed Weights   08/09/23 1952 08/10/23 0350  Weight: 106.6 kg 106 kg    Scheduled Meds:  amLODipine   10 mg Oral Daily   Chlorhexidine  Gluconate Cloth  6 each Topical Daily   feeding supplement  237 mL Oral BID BM   finasteride   5 mg Oral Daily   fluconazole   100 mg Oral Daily   fluticasone  furoate-vilanterol  1 puff Inhalation Daily   gabapentin   800 mg Oral TID   heparin   5,000 Units Subcutaneous Q8H   insulin  aspart  0-5 Units Subcutaneous QHS   insulin  aspart  0-9 Units Subcutaneous TID WC   insulin  glargine-yfgn  40 Units Subcutaneous Daily   nicotine   7 mg Transdermal Daily   nystatin   5 mL Oral QID   rosuvastatin   10 mg Oral Daily   Continuous Infusions:  insulin  Stopped (08/10/23 1532)    Nutritional status     Body mass index is 35.53 kg/m.  Data Reviewed:   CBC: Recent Labs  Lab 08/09/23 2142 08/11/23 0309  WBC 10.5 7.2  NEUTROABS 8.0*  --   HGB 17.0 14.5  HCT 50.2 44.1  MCV 95.1 95.2  PLT 228 183   Basic Metabolic Panel: Recent Labs  Lab 08/10/23 0104 08/10/23 0841 08/11/23 0309 08/12/23 0300 08/13/23 0457  NA 120* 135 136 133* 136  K 4.7 3.4* 3.5 3.6 3.7  CL 86* 100 101 99 100  CO2 21* 26 25 25 26   GLUCOSE 909* 223* 262* 316* 214*  BUN 25* 17 16 16 16   CREATININE 1.48* 0.98 0.97 0.99 1.10  CALCIUM  9.9 10.2 9.8 9.7 10.0  MG  --   --  1.8  --   --   PHOS  --   --  2.8  --   --    GFR: Estimated Creatinine Clearance: 76.9 mL/min (by C-G formula based on SCr of 1.1 mg/dL). Liver Function Tests: No results for input(s): AST, ALT, ALKPHOS, BILITOT, PROT, ALBUMIN in the last 168 hours. No results for input(s): LIPASE,  AMYLASE in the last 168 hours. No results for input(s): AMMONIA in the last 168 hours. Coagulation Profile: No results for input(s): INR, PROTIME in the last 168 hours. Cardiac Enzymes: No results for input(s): CKTOTAL, CKMB, CKMBINDEX, TROPONINI in the last 168 hours. BNP (last 3 results) No results for input(s): PROBNP in the last 8760 hours. HbA1C: Recent Labs    08/10/23 0841  HGBA1C 10.8*   CBG: Recent Labs  Lab  08/12/23 0811 08/12/23 1128 08/12/23 1631 08/12/23 2129 08/13/23 0736  GLUCAP 294* 299* 383* 418* 213*   Lipid Profile: No results for input(s): CHOL, HDL, LDLCALC, TRIG, CHOLHDL, LDLDIRECT in the last 72 hours. Thyroid Function Tests: No results for input(s): TSH, T4TOTAL, FREET4, T3FREE, THYROIDAB in the last 72 hours. Anemia Panel: No results for input(s): VITAMINB12, FOLATE, FERRITIN, TIBC, IRON, RETICCTPCT in the last 72 hours. Sepsis Labs: No results for input(s): PROCALCITON, LATICACIDVEN in the last 168 hours.  Recent Results (from the past 240 hours)  Resp panel by RT-PCR (RSV, Flu A&B, Covid) Anterior Nasal Swab     Status: None   Collection Time: 08/10/23  1:13 AM   Specimen: Anterior Nasal Swab  Result Value Ref Range Status   SARS Coronavirus 2 by RT PCR NEGATIVE NEGATIVE Final    Comment: (NOTE) SARS-CoV-2 target nucleic acids are NOT DETECTED.  The SARS-CoV-2 RNA is generally detectable in upper respiratory specimens during the acute phase of infection. The lowest concentration of SARS-CoV-2 viral copies this assay can detect is 138 copies/mL. A negative result does not preclude SARS-Cov-2 infection and should not be used as the sole basis for treatment or other patient management decisions. A negative result may occur with  improper specimen collection/handling, submission of specimen other than nasopharyngeal swab, presence of viral mutation(s) within the areas targeted by this  assay, and inadequate number of viral copies(<138 copies/mL). A negative result must be combined with clinical observations, patient history, and epidemiological information. The expected result is Negative.  Fact Sheet for Patients:  bloggercourse.com  Fact Sheet for Healthcare Providers:  seriousbroker.it  This test is no t yet approved or cleared by the [place]  FDA and  has been authorized for detection and/or diagnosis of SARS-CoV-2 by FDA under an Emergency Use Authorization (EUA). This EUA will remain  in effect (meaning this test can be used) for the duration of the COVID-19 declaration under Section 564(b)(1) of the Act, 21 U.S.C.section 360bbb-3(b)(1), unless the authorization is terminated  or revoked sooner.       Influenza A by PCR NEGATIVE NEGATIVE Final   Influenza B by PCR NEGATIVE NEGATIVE Final    Comment: (NOTE) The Xpert Xpress SARS-CoV-2/FLU/RSV plus assay is intended as an aid in the diagnosis of influenza from Nasopharyngeal swab specimens and should not be used as a sole basis for treatment. Nasal washings and aspirates are unacceptable for Xpert Xpress SARS-CoV-2/FLU/RSV testing.  Fact Sheet for Patients: bloggercourse.com  Fact Sheet for Healthcare Providers: seriousbroker.it  This test is not yet approved or cleared by the [place]  FDA and has been authorized for detection and/or diagnosis of SARS-CoV-2 by FDA under an Emergency Use Authorization (EUA). This EUA will remain in effect (meaning this test can be used) for the duration of the COVID-19 declaration under Section 564(b)(1) of the Act, 21 U.S.C. section 360bbb-3(b)(1), unless the authorization is terminated or revoked.     Resp Syncytial Virus by PCR NEGATIVE NEGATIVE Final    Comment: (NOTE) Fact Sheet for Patients: bloggercourse.com  Fact Sheet for  Healthcare Providers: seriousbroker.it  This test is not yet approved or cleared by the [place]  FDA and has been authorized for detection and/or diagnosis of SARS-CoV-2 by FDA under an Emergency Use Authorization (EUA). This EUA will remain in effect (meaning this test can be used) for the duration of the COVID-19 declaration under Section 564(b)(1) of the Act, 21 U.S.C. section 360bbb-3(b)(1), unless the authorization is terminated or revoked.  Performed at  [facility], [address] [place], [place] [address]   MRSA Next Gen by PCR, Nasal     Status: None   Collection Time: 08/10/23  8:00 AM   Specimen: Nasal Mucosa; Nasal Swab  Result Value Ref Range Status   MRSA by PCR Next Gen NOT DETECTED NOT DETECTED Final    Comment: (NOTE) The GeneXpert MRSA Assay (FDA approved for NASAL specimens only), is one component of a comprehensive MRSA colonization surveillance program. It is not intended to diagnose MRSA infection nor to guide or monitor treatment for MRSA infections. Test performance is not FDA approved in patients less than 20 years old. Performed at [facility], [address], [place], [place] [address]          Radiology Studies: No results found.     LOS: 3 days   Time spent= 35 mins  Reyes VEAR Gaw, MD [facility]  If 7PM-7AM, please contact night-coverage  08/13/2023, 8:16 AM

## 2023-08-14 DIAGNOSIS — E669 Obesity, unspecified: Secondary | ICD-10-CM | POA: Insufficient documentation

## 2023-08-14 DIAGNOSIS — E1100 Type 2 diabetes mellitus with hyperosmolarity without nonketotic hyperglycemic-hyperosmolar coma (NKHHC): Secondary | ICD-10-CM | POA: Diagnosis not present

## 2023-08-14 LAB — BASIC METABOLIC PANEL
Anion gap: 8 (ref 5–15)
BUN: 21 mg/dL (ref 8–23)
CO2: 24 mmol/L (ref 22–32)
Calcium: 9.8 mg/dL (ref 8.9–10.3)
Chloride: 102 mmol/L (ref 98–111)
Creatinine, Ser: 1 mg/dL (ref 0.61–1.24)
GFR, Estimated: >60 mL/min (ref >60)
Glucose, Bld: 271 mg/dL — ABNORMAL HIGH (ref 70–99)
Potassium: 3.8 mmol/L (ref 3.5–5.1)
Sodium: 134 mmol/L — ABNORMAL LOW (ref 135–145)

## 2023-08-14 LAB — GLUCOSE, CAPILLARY
Glucose-Capillary: 307 mg/dL — ABNORMAL HIGH (ref 70–99)
Glucose-Capillary: 277 mg/dL — ABNORMAL HIGH (ref 70–99)
Glucose-Capillary: 412 mg/dL — ABNORMAL HIGH (ref 70–99)
Glucose-Capillary: 441 mg/dL — ABNORMAL HIGH (ref 70–99)
Glucose-Capillary: 182 mg/dL — ABNORMAL HIGH (ref 70–99)

## 2023-08-14 MED ORDER — INSULIN ASPART 100 UNIT/ML IJ SOLN
2.0000 [IU] | Freq: Once | INTRAMUSCULAR | Status: AC
  Administered 2023-08-14: 2 [IU] via SUBCUTANEOUS

## 2023-08-14 MED ORDER — PEN NEEDLES 31G X 6 MM MISC: 1.0000 | Freq: Every evening | 1 refills | Status: AC

## 2023-08-14 MED ORDER — INSULIN GLARGINE 100 UNIT/ML SOLOSTAR PEN: 40.0000 [IU] | PEN_INJECTOR | Freq: Every day | SUBCUTANEOUS | 1 refills | Status: AC

## 2023-08-14 NOTE — Discharge Instructions (Signed)
 Check your fasting sugar every morning. Increase your evening insulin  (glargine) by one unit every evening until your fasting sugars are consistently below 160.

## 2023-08-14 NOTE — Discharge Summary (Signed)
 Peter Manning ZOX:[id number] DOB: 07/16/1955 DOA: 08/09/2023  PCP: Earlis Glimpse, NP  Admit date: 08/09/2023 Discharge date: 08/14/2023  Time spent: 35 minutes  Recommendations for Outpatient Follow-up:  Pcp f/u, continue insulin  titration     Discharge Diagnoses:  Principal Problem:   Hyperosmolar hyperglycemic state (HHS) (HCC) Active Problems:   OSA (obstructive sleep apnea)   Obesity (BMI 30-39.9)   Discharge Condition: stable  Diet recommendation: carb modified  Filed Weights   08/09/23 1952 08/10/23 0350  Weight: 106.6 kg 106 kg    History of present illness:  From admission h and p This is a 68 year old male with past medical history of T2DM, HLD, HTN, OSA, COPD, ongoing tobacco use, anxiety and depression, chronic pain syndrome.  He ran out of his insulin  approximately a week ago.  Today he started feeling weak.  He did not take his previously blood sugars.  No history of himself to the ER.  Patient is a scant historian.  Per EDP patient came in with complaints about his penis and a rash on his tongue and penis.   Hospital Course:  Patient presented with hyperglycemia in the setting of running out of insulin  a week prior. No DKA or. Here he was started on insulin  drip, transitioned to basal bolus. Patient on levemir  at home, will discharge on glargine as levemir  will soon be discontinued. Discharged with instructions for ongoing titration of glargine, and advising close PCP f/u. Patient was also treated for candida balanitis with oral fluconazole . Other chronic medical conditions stable.  Procedures: none   Consultations: none  Discharge Exam: Vitals:   08/14/23 0907 08/14/23 0908  BP:    Pulse:    Resp:    Temp:    SpO2: 99% 99%    General: NAD Cardiovascular: RRR Respiratory: CTAB  Discharge Instructions   Discharge Instructions     Diet Carb Modified   Complete by: As directed    Increase activity slowly   Complete by: As directed        Allergies as of 08/14/2023   No Known Allergies      Medication List     STOP taking these medications    insulin  detemir 100 UNIT/ML FlexPen Commonly known as: LEVEMIR        TAKE these medications    albuterol  108 (90 Base) MCG/ACT inhaler Commonly known as: VENTOLIN  HFA Inhale 2 puffs into the lungs every 4 (four) hours as needed for wheezing or shortness of breath.   amLODipine  10 MG tablet Commonly known as: NORVASC  Take 10 mg by mouth daily.   diazepam  5 MG tablet Commonly known as: Valium  Take 0.5 tablets (2.5 mg total) by mouth every 8 (eight) hours as needed for anxiety (spasms).   etodolac 400 MG 24 hr tablet Commonly known as: LODINE XL Take 400 mg by mouth daily.   finasteride  5 MG tablet Commonly known as: PROSCAR  Take 5 mg by mouth daily.   gabapentin  800 MG tablet Commonly known as: NEURONTIN  Take 800 mg by mouth daily.   hydrOXYzine  50 MG tablet Commonly known as: ATARAX  Take 50 mg by mouth every 8 (eight) hours as needed for anxiety.   insulin  aspart cartridge Commonly known as: NovoLOG  PenFill Inject 3 Units into the skin 3 (three) times daily with meals.   insulin  glargine 100 UNIT/ML Solostar Pen Commonly known as: LANTUS  Inject 40 Units into the skin at bedtime.   metFORMIN  500 MG tablet Commonly known as: Glucophage  Take 1 tablet (500 mg total) by  mouth 2 (two) times daily with a meal. What changed: when to take this   MULTIVITAMIN PO Take 1 tablet by mouth daily.   ondansetron  4 MG disintegrating tablet Commonly known as: ZOFRAN -ODT 4mg  ODT q4 hours prn nausea/vomit   oxyCODONE  15 MG immediate release tablet Commonly known as: ROXICODONE  Take 15 mg by mouth daily as needed for pain.   Pen Needles 31G X 6 MM Misc 1 each by Does not apply route at bedtime.   rosuvastatin  10 MG tablet Commonly known as: CRESTOR  Take 10 mg by mouth daily.   Symbicort 160-4.5 MCG/ACT inhaler Generic drug: budesonide-formoterol  Inhale 2  puffs into the lungs daily as needed (shortness of breath).   tiZANidine  4 MG tablet Commonly known as: ZANAFLEX  Take 4 mg by mouth 3 (three) times daily as needed for muscle spasms.   traZODone  50 MG tablet Commonly known as: DESYREL  Take 50 mg by mouth at bedtime as needed for sleep.   varenicline 1 MG tablet Commonly known as: CHANTIX Take 1 mg by mouth 2 (two) times daily.       No Known Allergies  Follow-up Information     Earlis Glimpse, NP Follow up.   Specialty: Nurse Practitioner Why: call to schedule an appointment within 1-2 weeks Contact information: [address] [place] [address] [phone]                  The results of significant diagnostics from this hospitalization (including imaging, microbiology, ancillary and laboratory) are listed below for reference.    Significant Diagnostic Studies: DG Chest 2 View Result Date: 08/09/2023 CLINICAL DATA:  Cough EXAM: CHEST - 2 VIEW COMPARISON:  Chest x-ray 07/26/2020 FINDINGS: The heart size and mediastinal contours are within normal limits. Both lungs are clear. The visualized skeletal structures are unremarkable. IMPRESSION: No active cardiopulmonary disease. Electronically Signed   By: Tyron Gallon M.D.   On: 08/09/2023 20:30    Microbiology: Recent Results (from the past 240 hours)  Resp panel by RT-PCR (RSV, Flu A&B, Covid) Anterior Nasal Swab     Status: None   Collection Time: 08/10/23  1:13 AM   Specimen: Anterior Nasal Swab  Result Value Ref Range Status   SARS Coronavirus 2 by RT PCR NEGATIVE NEGATIVE Final    Comment: (NOTE) SARS-CoV-2 target nucleic acids are NOT DETECTED.  The SARS-CoV-2 RNA is generally detectable in upper respiratory specimens during the acute phase of infection. The lowest concentration of SARS-CoV-2 viral copies this assay can detect is 138 copies/mL. A negative result does not preclude SARS-Cov-2 infection and should not be used as the sole basis for  treatment or other patient management decisions. A negative result may occur with  improper specimen collection/handling, submission of specimen other than nasopharyngeal swab, presence of viral mutation(s) within the areas targeted by this assay, and inadequate number of viral copies(<138 copies/mL). A negative result must be combined with clinical observations, patient history, and epidemiological information. The expected result is Negative.  Fact Sheet for Patients:  BloggerCourse.com  Fact Sheet for Healthcare Providers:  SeriousBroker.it  This test is no t yet approved or cleared by the [place]  FDA and  has been authorized for detection and/or diagnosis of SARS-CoV-2 by FDA under an Emergency Use Authorization (EUA). This EUA will remain  in effect (meaning this test can be used) for the duration of the COVID-19 declaration under Section 564(b)(1) of the Act, 21 U.S.C.section 360bbb-3(b)(1), unless the authorization is terminated  or revoked sooner.  Influenza A by PCR NEGATIVE NEGATIVE Final   Influenza B by PCR NEGATIVE NEGATIVE Final    Comment: (NOTE) The Xpert Xpress SARS-CoV-2/FLU/RSV plus assay is intended as an aid in the diagnosis of influenza from Nasopharyngeal swab specimens and should not be used as a sole basis for treatment. Nasal washings and aspirates are unacceptable for Xpert Xpress SARS-CoV-2/FLU/RSV testing.  Fact Sheet for Patients: BloggerCourse.com  Fact Sheet for Healthcare Providers: SeriousBroker.it  This test is not yet approved or cleared by the [place]  FDA and has been authorized for detection and/or diagnosis of SARS-CoV-2 by FDA under an Emergency Use Authorization (EUA). This EUA will remain in effect (meaning this test can be used) for the duration of the COVID-19 declaration under Section 564(b)(1) of the Act, 21  U.S.C. section 360bbb-3(b)(1), unless the authorization is terminated or revoked.     Resp Syncytial Virus by PCR NEGATIVE NEGATIVE Final    Comment: (NOTE) Fact Sheet for Patients: BloggerCourse.com  Fact Sheet for Healthcare Providers: SeriousBroker.it  This test is not yet approved or cleared by the [place]  FDA and has been authorized for detection and/or diagnosis of SARS-CoV-2 by FDA under an Emergency Use Authorization (EUA). This EUA will remain in effect (meaning this test can be used) for the duration of the COVID-19 declaration under Section 564(b)(1) of the Act, 21 U.S.C. section 360bbb-3(b)(1), unless the authorization is terminated or revoked.  Performed at [facility], [address], [place], [place] [address]   MRSA Next Gen by PCR, Nasal     Status: None   Collection Time: 08/10/23  8:00 AM   Specimen: Nasal Mucosa; Nasal Swab  Result Value Ref Range Status   MRSA by PCR Next Gen NOT DETECTED NOT DETECTED Final    Comment: (NOTE) The GeneXpert MRSA Assay (FDA approved for NASAL specimens only), is one component of a comprehensive MRSA colonization surveillance program. It is not intended to diagnose MRSA infection nor to guide or monitor treatment for MRSA infections. Test performance is not FDA approved in patients less than 56 years old. Performed at [facility], [address], [place], [place] [address]      Labs: Basic Metabolic Panel: Recent Labs  Lab 08/10/23 0841 08/11/23 0309 08/12/23 0300 08/13/23 0457 08/14/23 0419  NA 135 136 133* 136 134*  K 3.4* 3.5 3.6 3.7 3.8  CL 100 101 99 100 102  CO2 26 25 25 26 24   GLUCOSE 223* 262* 316* 214* 271*  BUN 17 16 16 16 21   CREATININE 0.98 0.97 0.99 1.10 1.00  CALCIUM  10.2 9.8 9.7 10.0 9.8  MG  --  1.8  --   --   --   PHOS  --  2.8  --   --   --    Liver Function Tests: No results for input(s):  "AST", "ALT", "ALKPHOS", "BILITOT", "PROT", "ALBUMIN" in the last 168 hours. No results for input(s): "LIPASE", "AMYLASE" in the last 168 hours. No results for input(s): "AMMONIA" in the last 168 hours. CBC: Recent Labs  Lab 08/09/23 2142 08/11/23 0309  WBC 10.5 7.2  NEUTROABS 8.0*  --   HGB 17.0 14.5  HCT 50.2 44.1  MCV 95.1 95.2  PLT 228 183   Cardiac Enzymes: No results for input(s): "CKTOTAL", "CKMB", "CKMBINDEX", "TROPONINI" in the last 168 hours. BNP: BNP (last 3 results) No results for input(s): "BNP" in the last 8760 hours.  ProBNP (last 3 results) No results for input(s): "PROBNP" in  the last 8760 hours.  CBG: Recent Labs  Lab 08/13/23 1611 08/13/23 1959 08/13/23 2324 08/14/23 0247 08/14/23 0801  GLUCAP 466* 441* 412* 307* 182*       Signed:  Raymonde Calico MD.  [facility] 08/14/2023, 9:53 AM

## 2023-08-14 NOTE — Progress Notes (Signed)
 Blood glucose 441. Patient is asymptomatic of findings. Sharion Davidson, NP notified.  Novolog  6 units ordered and administered.  Will reassess at midnight.

## 2023-08-14 NOTE — Plan of Care (Signed)

## 2023-08-14 NOTE — Progress Notes (Signed)
   08/14/23 0200  BiPAP/CPAP/SIPAP  $ Non-Invasive Home Ventilator  Subsequent  BiPAP/CPAP/SIPAP Pt Type Adult  BiPAP/CPAP/SIPAP Resmed  Mask Type Full face mask  Mask Size Large  FiO2 (%) 21 %  Patient Home Equipment No  Auto Titrate Yes (5-20 cmH2O)

## 2023-08-14 NOTE — Progress Notes (Signed)
 Repeat BG 307. Patient continues to be asymptomatic of findings.  Sharion Davidson, NP notified.  Novolog  2 units ordered and administered.

## 2023-08-14 NOTE — Progress Notes (Signed)
 Patient discharged via wheelchair on RA and VSS. Patient had all belongings with him which included clothes, shoes, and a cell phone. Patient was in no distress when discharged.

## 2023-08-14 NOTE — Progress Notes (Signed)
 Repeat BG 412. Patient remains asymptomatic of findings. Bearl Limes, NP notified.  Advised to repeat BG @ 0300

## 2023-09-21 ENCOUNTER — Ambulatory Visit: Admitting: Podiatry

## 2023-10-04 ENCOUNTER — Encounter: Payer: Self-pay | Admitting: Internal Medicine

## 2023-10-05 ENCOUNTER — Ambulatory Visit (INDEPENDENT_AMBULATORY_CARE_PROVIDER_SITE_OTHER): Admitting: Podiatry

## 2023-10-05 DIAGNOSIS — Z91199 Patient's noncompliance with other medical treatment and regimen due to unspecified reason: Secondary | ICD-10-CM

## 2023-10-07 NOTE — Progress Notes (Signed)
 Patient has canceled or been a no show for two consecutive appointments

## 2023-12-19 ENCOUNTER — Other Ambulatory Visit: Payer: Self-pay

## 2023-12-19 ENCOUNTER — Emergency Department (HOSPITAL_COMMUNITY)

## 2023-12-19 ENCOUNTER — Inpatient Hospital Stay (HOSPITAL_COMMUNITY)
Admission: EM | Admit: 2023-12-19 | Discharge: 2023-12-21 | DRG: 639 | Discharge status: Against Medical Advice | Attending: Internal Medicine | Admitting: Internal Medicine

## 2023-12-19 ENCOUNTER — Encounter (HOSPITAL_COMMUNITY): Payer: Self-pay

## 2023-12-19 DIAGNOSIS — Z7951 Long term (current) use of inhaled steroids: Secondary | ICD-10-CM

## 2023-12-19 DIAGNOSIS — Z833 Family history of diabetes mellitus: Secondary | ICD-10-CM

## 2023-12-19 DIAGNOSIS — Z8249 Family history of ischemic heart disease and other diseases of the circulatory system: Secondary | ICD-10-CM

## 2023-12-19 DIAGNOSIS — F17210 Nicotine dependence, cigarettes, uncomplicated: Secondary | ICD-10-CM | POA: Diagnosis present

## 2023-12-19 DIAGNOSIS — B379 Candidiasis, unspecified: Secondary | ICD-10-CM | POA: Diagnosis present

## 2023-12-19 DIAGNOSIS — E66811 Obesity, class 1: Secondary | ICD-10-CM | POA: Diagnosis present

## 2023-12-19 DIAGNOSIS — N481 Balanitis: Secondary | ICD-10-CM | POA: Diagnosis present

## 2023-12-19 DIAGNOSIS — Z96642 Presence of left artificial hip joint: Secondary | ICD-10-CM | POA: Diagnosis present

## 2023-12-19 DIAGNOSIS — Z794 Long term (current) use of insulin: Secondary | ICD-10-CM

## 2023-12-19 DIAGNOSIS — E1100 Type 2 diabetes mellitus with hyperosmolarity without nonketotic hyperglycemic-hyperosmolar coma (NKHHC): Principal | ICD-10-CM | POA: Diagnosis present

## 2023-12-19 DIAGNOSIS — Z6833 Body mass index (BMI) 33.0-33.9, adult: Secondary | ICD-10-CM

## 2023-12-19 DIAGNOSIS — Z8701 Personal history of pneumonia (recurrent): Secondary | ICD-10-CM

## 2023-12-19 DIAGNOSIS — F419 Anxiety disorder, unspecified: Secondary | ICD-10-CM | POA: Diagnosis present

## 2023-12-19 DIAGNOSIS — R739 Hyperglycemia, unspecified: Principal | ICD-10-CM | POA: Diagnosis present

## 2023-12-19 DIAGNOSIS — Z87442 Personal history of urinary calculi: Secondary | ICD-10-CM

## 2023-12-19 DIAGNOSIS — Z96651 Presence of right artificial knee joint: Secondary | ICD-10-CM | POA: Diagnosis present

## 2023-12-19 DIAGNOSIS — I10 Essential (primary) hypertension: Secondary | ICD-10-CM | POA: Diagnosis present

## 2023-12-19 DIAGNOSIS — Z91148 Patient's other noncompliance with medication regimen for other reason: Secondary | ICD-10-CM

## 2023-12-19 DIAGNOSIS — E785 Hyperlipidemia, unspecified: Secondary | ICD-10-CM | POA: Diagnosis present

## 2023-12-19 DIAGNOSIS — Z7984 Long term (current) use of oral hypoglycemic drugs: Secondary | ICD-10-CM

## 2023-12-19 DIAGNOSIS — E1140 Type 2 diabetes mellitus with diabetic neuropathy, unspecified: Secondary | ICD-10-CM | POA: Diagnosis present

## 2023-12-19 DIAGNOSIS — Z5329 Procedure and treatment not carried out because of patient's decision for other reasons: Secondary | ICD-10-CM | POA: Diagnosis present

## 2023-12-19 LAB — CBC WITH DIFFERENTIAL/PLATELET
Abs Immature Granulocytes: 0.03 10*3/uL (ref 0.00–0.07)
Basophils Absolute: 0.1 10*3/uL (ref 0.0–0.1)
Basophils Relative: 1 %
Eosinophils Absolute: 0.2 10*3/uL (ref 0.0–0.5)
Eosinophils Relative: 2 %
HCT: 47.8 % (ref 39.0–52.0)
Hemoglobin: 16.2 g/dL (ref 13.0–17.0)
Immature Granulocytes: 0 %
Lymphocytes Relative: 16 %
Lymphs Abs: 1.5 10*3/uL (ref 0.7–4.0)
MCH: 32.3 pg (ref 26.0–34.0)
MCHC: 33.9 g/dL (ref 30.0–36.0)
MCV: 95.2 fL (ref 80.0–100.0)
Monocytes Absolute: 0.5 10*3/uL (ref 0.1–1.0)
Monocytes Relative: 5 %
Neutro Abs: 7 10*3/uL (ref 1.7–7.7)
Neutrophils Relative %: 76 %
Platelets: 211 10*3/uL (ref 150–400)
RBC: 5.02 MIL/uL (ref 4.22–5.81)
RDW: 11.6 % (ref 11.5–15.5)
WBC: 9.2 10*3/uL (ref 4.0–10.5)
nRBC: 0 % (ref 0.0–0.2)

## 2023-12-19 LAB — URINALYSIS, ROUTINE W REFLEX MICROSCOPIC
Bacteria, UA: NONE SEEN
Bilirubin Urine: NEGATIVE
Glucose, UA: >=500 mg/dL — AB
Ketones, ur: 5 mg/dL — AB
Leukocytes,Ua: NEGATIVE
Nitrite: NEGATIVE
Protein, ur: NEGATIVE mg/dL
Specific Gravity, Urine: 1.024 (ref 1.005–1.030)
pH: 5 (ref 5.0–8.0)

## 2023-12-19 LAB — BASIC METABOLIC PANEL WITH GFR
Anion gap: 14 (ref 5–15)
BUN: 33 mg/dL — ABNORMAL HIGH (ref 8–23)
CO2: 22 mmol/L (ref 22–32)
Calcium: 10.8 mg/dL — ABNORMAL HIGH (ref 8.9–10.3)
Chloride: 98 mmol/L (ref 98–111)
Creatinine, Ser: 1.39 mg/dL — ABNORMAL HIGH (ref 0.61–1.24)
GFR, Estimated: 56 mL/min — ABNORMAL LOW (ref >60)
Glucose, Bld: 619 mg/dL (ref 70–99)
Potassium: 5.6 mmol/L — ABNORMAL HIGH (ref 3.5–5.1)
Sodium: 134 mmol/L — ABNORMAL LOW (ref 135–145)

## 2023-12-19 LAB — BLOOD GAS, VENOUS
Acid-Base Excess: 0.3 mmol/L (ref 0.0–2.0)
Bicarbonate: 25.4 mmol/L (ref 20.0–28.0)
O2 Saturation: 91.4 %
Patient temperature: 37
pCO2, Ven: 42 mmHg — ABNORMAL LOW (ref 44–60)
pH, Ven: 7.39 (ref 7.25–7.43)
pO2, Ven: 60 mmHg — ABNORMAL HIGH (ref 32–45)

## 2023-12-19 LAB — BETA-HYDROXYBUTYRIC ACID: Beta-Hydroxybutyric Acid: 1.37 mmol/L — ABNORMAL HIGH (ref 0.05–0.27)

## 2023-12-19 LAB — MAGNESIUM: Magnesium: 2.5 mg/dL — ABNORMAL HIGH (ref 1.7–2.4)

## 2023-12-19 LAB — CBG MONITORING, ED: Glucose-Capillary: >600 mg/dL (ref 70–99)

## 2023-12-19 MED ORDER — INSULIN ASPART 100 UNIT/ML IJ SOLN
10.0000 [IU] | Freq: Once | INTRAMUSCULAR | Status: AC
  Administered 2023-12-20: 10 [IU] via SUBCUTANEOUS
  Filled 2023-12-19: qty 0.1

## 2023-12-19 MED ORDER — LACTATED RINGERS IV BOLUS
2000.0000 mL | Freq: Once | INTRAVENOUS | Status: AC
Start: 2023-12-19 — End: 2023-12-20
  Administered 2023-12-19: 2000 mL via INTRAVENOUS

## 2023-12-19 MED ORDER — FLUCONAZOLE 150 MG PO TABS
150.0000 mg | ORAL_TABLET | Freq: Once | ORAL | Status: AC
  Administered 2023-12-20: 150 mg via ORAL
  Filled 2023-12-19: qty 1

## 2023-12-19 NOTE — ED Provider Notes (Signed)
 [place] EMERGENCY DEPARTMENT AT [facility] Provider Note   CSN: [id number] Arrival date & time: 12/19/23  2126     Patient presents with: Hyperglycemia   Peter Manning is a 68 y.o. male.    Hyperglycemia Associated symptoms: dizziness and polyuria   Patient presents for hyperglycemia.  Medical history includes OSA, depression, HTN, HLD, DM.  He was admitted in February for [facility]. He states that his sugars have been elevated over the past few days but were worsened today.  He has been having dizziness, polyuria and dry mouth.  He denies any other recent symptoms.  His last dose of insulin  was at 5:30 PM.  Due to his persistently elevated blood sugars, he called EMS.  EMS noted CBG 582.  They did give 500 cc IVF prior to arrival.     Prior to Admission medications   Medication Sig Start Date End Date Taking? Authorizing Provider  albuterol  (PROVENTIL  HFA;VENTOLIN  HFA) 108 (90 Base) MCG/ACT inhaler Inhale 2 puffs into the lungs every 4 (four) hours as needed for wheezing or shortness of breath. 07/19/17   Lind Repine, MD  amLODipine  (NORVASC ) 10 MG tablet Take 10 mg by mouth daily.  08/24/16   [provider]  diazepam  (VALIUM ) 5 MG tablet Take 0.5 tablets (2.5 mg total) by mouth every 8 (eight) hours as needed for anxiety (spasms). 07/26/20   Mesner, Jason, MD  etodolac (LODINE XL) 400 MG 24 hr tablet Take 400 mg by mouth daily. 06/07/23   [provider]  finasteride  (PROSCAR ) 5 MG tablet Take 5 mg by mouth daily. Patient not taking: Reported on 08/10/2023 06/05/23   [provider]  gabapentin  (NEURONTIN ) 800 MG tablet Take 800 mg by mouth daily. 10/11/19   [provider]  hydrOXYzine  (ATARAX /VISTARIL ) 50 MG tablet Take 50 mg by mouth every 8 (eight) hours as needed for anxiety.  07/10/18   [provider]  insulin  aspart (NOVOLOG  PENFILL) cartridge Inject 3 Units into the skin 3 (three) times daily with meals. Patient not taking:  Reported on 08/10/2023 08/29/18   Macdonald Savoy, MD  insulin  glargine (LANTUS ) 100 UNIT/ML Solostar Pen Inject 40 Units into the skin at bedtime. 08/14/23   Wouk, Haynes Lips, MD  Insulin  Pen Needle (PEN NEEDLES) 31G X 6 MM MISC 1 each by Does not apply route at bedtime. 08/14/23   Wouk, Haynes Lips, MD  metFORMIN  (GLUCOPHAGE ) 500 MG tablet Take 1 tablet (500 mg total) by mouth 2 (two) times daily with a meal. Patient taking differently: Take 500 mg by mouth daily. 08/29/18 11/02/28  Macdonald Savoy, MD  Multiple Vitamin (MULTIVITAMIN PO) Take 1 tablet by mouth daily.    [provider]  ondansetron  (ZOFRAN -ODT) 4 MG disintegrating tablet 4mg  ODT q4 hours prn nausea/vomit 07/22/22   Pollina, Marine Sia, MD  oxyCODONE  (ROXICODONE ) 15 MG immediate release tablet Take 15 mg by mouth daily as needed for pain. 08/01/23   [provider]  rosuvastatin  (CRESTOR ) 10 MG tablet Take 10 mg by mouth daily. 06/10/23   [provider]  SYMBICORT 160-4.5 MCG/ACT inhaler Inhale 2 puffs into the lungs daily as needed (shortness of breath).  08/05/18   [provider]  tiZANidine  (ZANAFLEX ) 4 MG tablet Take 4 mg by mouth 3 (three) times daily as needed for muscle spasms. 07/31/23   [provider]  traZODone  (DESYREL ) 50 MG tablet Take 50 mg by mouth at bedtime as needed for sleep.  08/21/18   [provider]  varenicline (CHANTIX) 1 MG tablet Take 1 mg by mouth 2 (two) times daily. Patient not taking: Reported on 08/10/2023 06/30/23   [provider]    Allergies: Patient has no known allergies.    Review of Systems  Endocrine: Positive for polyuria.  Neurological:  Positive for dizziness.  All other systems reviewed and are negative.   Updated Vital Signs BP 133/85   Pulse 88   Temp 99.1 F (37.3 C) (Oral)   Resp (!) 22   Ht 5' 8 (1.727 m)   Wt 99.8 kg   SpO2 93%   BMI 33.45 kg/m   Physical Exam Vitals and nursing note reviewed.   Constitutional:      General: He is not in acute distress.    Appearance: Normal appearance. He is well-developed. He is not ill-appearing, toxic-appearing or diaphoretic.  HENT:     Head: Normocephalic and atraumatic.     Right Ear: External ear normal.     Left Ear: External ear normal.     Nose: Nose normal.     Mouth/Throat:     Mouth: Mucous membranes are dry.   Eyes:     Extraocular Movements: Extraocular movements intact.     Conjunctiva/sclera: Conjunctivae normal.    Cardiovascular:     Rate and Rhythm: Normal rate and regular rhythm.     Heart sounds: No murmur heard. Pulmonary:     Effort: Pulmonary effort is normal. No respiratory distress.     Breath sounds: Normal breath sounds. No wheezing or rales.  Abdominal:     General: There is no distension.     Palpations: Abdomen is soft.     Tenderness: There is no abdominal tenderness.   Musculoskeletal:        General: No swelling. Normal range of motion.     Cervical back: Normal range of motion and neck supple.   Skin:    General: Skin is warm and dry.     Coloration: Skin is not jaundiced or pale.   Neurological:     General: No focal deficit present.     Mental Status: He is alert and oriented to person, place, and time.   Psychiatric:        Mood and Affect: Mood normal.        Behavior: Behavior normal.     (all labs ordered are listed, but only abnormal results are displayed) Labs Reviewed  URINALYSIS, ROUTINE W REFLEX MICROSCOPIC - Abnormal; Notable for the following components:      Result Value   Glucose, UA >=500 (*)    Hgb urine dipstick MODERATE (*)    Ketones, ur 5 (*)    All other components within normal limits  BLOOD GAS, VENOUS - Abnormal; Notable for the following components:   pCO2, Ven 42 (*)    pO2, Ven 60 (*)    All other components within normal limits  CBG MONITORING, ED - Abnormal; Notable for the following components:   Glucose-Capillary >600 (*)    All other components  within normal limits  CBC WITH DIFFERENTIAL/PLATELET  BASIC METABOLIC PANEL WITH GFR  BASIC METABOLIC PANEL WITH GFR  BASIC METABOLIC PANEL WITH GFR  BASIC METABOLIC PANEL WITH GFR  OSMOLALITY  BETA-HYDROXYBUTYRIC ACID  BETA-HYDROXYBUTYRIC ACID  MAGNESIUM    EKG: None  Radiology: No results found.   Procedures   Medications Ordered in the ED  fluconazole  (DIFLUCAN ) tablet 150 mg (has no administration in time range)  lactated  ringers  bolus 2,000 mL (2,000 mLs Intravenous New Bag/Given 12/19/23 2246)                                    Medical Decision Making Amount and/or Complexity of Data Reviewed Labs: ordered.   This patient presents to the ED for concern of hyperglycemia, this involves an extensive number of treatment options, and is a complaint that carries with it a high risk of complications and morbidity.  The differential diagnosis includes DKA, HHS, dehydration, other metabolic derangements   Co morbidities / Chronic conditions that complicate the patient evaluation  OSA, depression, HTN, HLD, DM   Additional history obtained:  Additional history obtained from EMR External records from outside source obtained and reviewed including N/A   Lab Tests:  I Ordered, and personally interpreted labs.  The pertinent results include: Normal hemoglobin, no leukocytosis, hyperglycemia on fingerstick CBG.  Budding yeast and glucosuria present on urinalysis.  Remaining lab work pending at time of signout.   Imaging Studies ordered:  I ordered imaging studies including chest x-ray, CT head I independently visualized and interpreted imaging which showed (pending at time of signout) I agree with the radiologist interpretation   Cardiac Monitoring: / EKG:  The patient was maintained on a cardiac monitor.  I personally viewed and interpreted the cardiac monitored which showed an underlying rhythm of: Sinus rhythm   Problem List / ED Course / Critical  interventions / Medication management  Patient presenting for hyperglycemia.  He was admitted for HHS earlier this year.  On arrival, CBG is greater than 600.  On exam, patient appears somnolent and has mildly slurred speech, which he attributes to his dry mouth.  He has no other focal deficits.  He has had recent dizziness and polyuria.  2 L of IV fluid were ordered.  Workup was initiated.  Lab and imaging studies pending at time of signout.  Care of patient was signed out to oncoming ED provider. I ordered medication including IV fluids for hydration Reevaluation of the patient after these medicines showed that the patient improved I have reviewed the patients home medicines and have made adjustments as needed   Social Determinants of Health:  Lives independently     Final diagnoses:  Hyperglycemia    ED Discharge Orders     None          Iva Mariner, MD 12/19/23 2326

## 2023-12-19 NOTE — ED Triage Notes (Signed)
 Pt arrived via [facility] for hyperglycemia from home. Cbg was 582. C/o dizziness,  500 cc. of NaCl BP 134/82; HR 90 SR; O2 96% RA; RR 20.

## 2023-12-20 DIAGNOSIS — F419 Anxiety disorder, unspecified: Secondary | ICD-10-CM | POA: Diagnosis present

## 2023-12-20 DIAGNOSIS — Z7951 Long term (current) use of inhaled steroids: Secondary | ICD-10-CM | POA: Diagnosis not present

## 2023-12-20 DIAGNOSIS — Z7984 Long term (current) use of oral hypoglycemic drugs: Secondary | ICD-10-CM | POA: Diagnosis not present

## 2023-12-20 DIAGNOSIS — E1100 Type 2 diabetes mellitus with hyperosmolarity without nonketotic hyperglycemic-hyperosmolar coma (NKHHC): Secondary | ICD-10-CM | POA: Diagnosis present

## 2023-12-20 DIAGNOSIS — N481 Balanitis: Secondary | ICD-10-CM | POA: Diagnosis present

## 2023-12-20 DIAGNOSIS — Z794 Long term (current) use of insulin: Secondary | ICD-10-CM | POA: Diagnosis not present

## 2023-12-20 DIAGNOSIS — E785 Hyperlipidemia, unspecified: Secondary | ICD-10-CM | POA: Diagnosis present

## 2023-12-20 DIAGNOSIS — R739 Hyperglycemia, unspecified: Secondary | ICD-10-CM | POA: Diagnosis present

## 2023-12-20 DIAGNOSIS — E66811 Obesity, class 1: Secondary | ICD-10-CM | POA: Diagnosis present

## 2023-12-20 DIAGNOSIS — Z833 Family history of diabetes mellitus: Secondary | ICD-10-CM | POA: Diagnosis not present

## 2023-12-20 DIAGNOSIS — Z8249 Family history of ischemic heart disease and other diseases of the circulatory system: Secondary | ICD-10-CM | POA: Diagnosis not present

## 2023-12-20 DIAGNOSIS — F17210 Nicotine dependence, cigarettes, uncomplicated: Secondary | ICD-10-CM | POA: Diagnosis present

## 2023-12-20 DIAGNOSIS — Z87442 Personal history of urinary calculi: Secondary | ICD-10-CM | POA: Diagnosis not present

## 2023-12-20 DIAGNOSIS — E1140 Type 2 diabetes mellitus with diabetic neuropathy, unspecified: Secondary | ICD-10-CM | POA: Diagnosis present

## 2023-12-20 DIAGNOSIS — Z8701 Personal history of pneumonia (recurrent): Secondary | ICD-10-CM | POA: Diagnosis not present

## 2023-12-20 DIAGNOSIS — Z6833 Body mass index (BMI) 33.0-33.9, adult: Secondary | ICD-10-CM | POA: Diagnosis not present

## 2023-12-20 DIAGNOSIS — Z91148 Patient's other noncompliance with medication regimen for other reason: Secondary | ICD-10-CM | POA: Diagnosis not present

## 2023-12-20 DIAGNOSIS — Z5329 Procedure and treatment not carried out because of patient's decision for other reasons: Secondary | ICD-10-CM | POA: Diagnosis present

## 2023-12-20 DIAGNOSIS — Z96642 Presence of left artificial hip joint: Secondary | ICD-10-CM | POA: Diagnosis present

## 2023-12-20 DIAGNOSIS — I10 Essential (primary) hypertension: Secondary | ICD-10-CM | POA: Diagnosis present

## 2023-12-20 DIAGNOSIS — Z96651 Presence of right artificial knee joint: Secondary | ICD-10-CM | POA: Diagnosis present

## 2023-12-20 DIAGNOSIS — B379 Candidiasis, unspecified: Secondary | ICD-10-CM | POA: Diagnosis present

## 2023-12-20 LAB — BASIC METABOLIC PANEL WITH GFR
Anion gap: 11 (ref 5–15)
Anion gap: 8 (ref 5–15)
Anion gap: 10 (ref 5–15)
Anion gap: 8 (ref 5–15)
BUN: 29 mg/dL — ABNORMAL HIGH (ref 8–23)
BUN: 27 mg/dL — ABNORMAL HIGH (ref 8–23)
BUN: 25 mg/dL — ABNORMAL HIGH (ref 8–23)
BUN: 22 mg/dL (ref 8–23)
CO2: 23 mmol/L (ref 22–32)
CO2: 25 mmol/L (ref 22–32)
CO2: 25 mmol/L (ref 22–32)
CO2: 24 mmol/L (ref 22–32)
Calcium: 10.7 mg/dL — ABNORMAL HIGH (ref 8.9–10.3)
Calcium: 10.4 mg/dL — ABNORMAL HIGH (ref 8.9–10.3)
Calcium: 10.4 mg/dL — ABNORMAL HIGH (ref 8.9–10.3)
Calcium: 10.2 mg/dL (ref 8.9–10.3)
Chloride: 99 mmol/L (ref 98–111)
Chloride: 102 mmol/L (ref 98–111)
Chloride: 102 mmol/L (ref 98–111)
Chloride: 103 mmol/L (ref 98–111)
Creatinine, Ser: 1.26 mg/dL — ABNORMAL HIGH (ref 0.61–1.24)
Creatinine, Ser: 1.12 mg/dL (ref 0.61–1.24)
Creatinine, Ser: 1.02 mg/dL (ref 0.61–1.24)
Creatinine, Ser: 1.15 mg/dL (ref 0.61–1.24)
GFR, Estimated: >60 mL/min (ref >60)
GFR, Estimated: >60 mL/min (ref >60)
GFR, Estimated: >60 mL/min (ref >60)
GFR, Estimated: >60 mL/min (ref >60)
Glucose, Bld: 407 mg/dL — ABNORMAL HIGH (ref 70–99)
Glucose, Bld: 325 mg/dL — ABNORMAL HIGH (ref 70–99)
Glucose, Bld: 215 mg/dL — ABNORMAL HIGH (ref 70–99)
Glucose, Bld: 285 mg/dL — ABNORMAL HIGH (ref 70–99)
Potassium: 4.5 mmol/L (ref 3.5–5.1)
Potassium: 4 mmol/L (ref 3.5–5.1)
Potassium: 3.8 mmol/L (ref 3.5–5.1)
Potassium: 4.3 mmol/L (ref 3.5–5.1)
Sodium: 133 mmol/L — ABNORMAL LOW (ref 135–145)
Sodium: 135 mmol/L (ref 135–145)
Sodium: 137 mmol/L (ref 135–145)
Sodium: 135 mmol/L (ref 135–145)

## 2023-12-20 LAB — CBC
HCT: 46.4 % (ref 39.0–52.0)
Hemoglobin: 15.6 g/dL (ref 13.0–17.0)
MCH: 32.4 pg (ref 26.0–34.0)
MCHC: 33.6 g/dL (ref 30.0–36.0)
MCV: 96.5 fL (ref 80.0–100.0)
Platelets: 201 10*3/uL (ref 150–400)
RBC: 4.81 MIL/uL (ref 4.22–5.81)
RDW: 11.7 % (ref 11.5–15.5)
WBC: 9.2 10*3/uL (ref 4.0–10.5)
nRBC: 0 % (ref 0.0–0.2)

## 2023-12-20 LAB — CBG MONITORING, ED
Glucose-Capillary: 342 mg/dL — ABNORMAL HIGH (ref 70–99)
Glucose-Capillary: 268 mg/dL — ABNORMAL HIGH (ref 70–99)
Glucose-Capillary: 245 mg/dL — ABNORMAL HIGH (ref 70–99)

## 2023-12-20 LAB — BLOOD GAS, VENOUS
Acid-Base Excess: 1.9 mmol/L (ref 0.0–2.0)
Bicarbonate: 26.6 mmol/L (ref 20.0–28.0)
O2 Saturation: 92.8 %
Patient temperature: 37
pCO2, Ven: 41 mmHg — ABNORMAL LOW (ref 44–60)
pH, Ven: 7.42 (ref 7.25–7.43)
pO2, Ven: 62 mmHg — ABNORMAL HIGH (ref 32–45)

## 2023-12-20 LAB — GLUCOSE, CAPILLARY
Glucose-Capillary: 232 mg/dL — ABNORMAL HIGH (ref 70–99)
Glucose-Capillary: 307 mg/dL — ABNORMAL HIGH (ref 70–99)
Glucose-Capillary: 330 mg/dL — ABNORMAL HIGH (ref 70–99)

## 2023-12-20 LAB — BETA-HYDROXYBUTYRIC ACID
Beta-Hydroxybutyric Acid: 0.98 mmol/L — ABNORMAL HIGH (ref 0.05–0.27)
Beta-Hydroxybutyric Acid: 0.48 mmol/L — ABNORMAL HIGH (ref 0.05–0.27)
Beta-Hydroxybutyric Acid: 0.17 mmol/L (ref 0.05–0.27)

## 2023-12-20 LAB — OSMOLALITY: Osmolality: 334 mosm/kg (ref 275–295)

## 2023-12-20 MED ORDER — GABAPENTIN 400 MG PO CAPS
800.0000 mg | ORAL_CAPSULE | Freq: Every day | ORAL | Status: DC
  Administered 2023-12-20 – 2023-12-21 (×2): 800 mg via ORAL
  Filled 2023-12-20 (×2): qty 2

## 2023-12-20 MED ORDER — CEFTRIAXONE SODIUM 1 G IJ SOLR
500.0000 mg | Freq: Once | INTRAMUSCULAR | Status: AC
  Administered 2023-12-20: 500 mg via INTRAMUSCULAR
  Filled 2023-12-20: qty 10

## 2023-12-20 MED ORDER — ENOXAPARIN SODIUM 40 MG/0.4ML IJ SOSY
40.0000 mg | PREFILLED_SYRINGE | INTRAMUSCULAR | Status: DC
  Administered 2023-12-20 – 2023-12-21 (×2): 40 mg via SUBCUTANEOUS
  Filled 2023-12-20 (×2): qty 0.4

## 2023-12-20 MED ORDER — ACETAMINOPHEN 650 MG RE SUPP: 650.0000 mg | Freq: Four times a day (QID) | RECTAL | Status: DC | PRN

## 2023-12-20 MED ORDER — ENOXAPARIN SODIUM 40 MG/0.4ML IJ SOSY: 40.0000 mg | PREFILLED_SYRINGE | INTRAMUSCULAR | Status: DC

## 2023-12-20 MED ORDER — INSULIN ASPART 100 UNIT/ML IJ SOLN
0.0000 [IU] | Freq: Three times a day (TID) | INTRAMUSCULAR | Status: DC
  Administered 2023-12-20: 11 [IU] via SUBCUTANEOUS
  Administered 2023-12-20: 5 [IU] via SUBCUTANEOUS
  Administered 2023-12-21 (×2): 11 [IU] via SUBCUTANEOUS
  Filled 2023-12-20: qty 0.15

## 2023-12-20 MED ORDER — AMOXICILLIN-POT CLAVULANATE 875-125 MG PO TABS: 1.0000 | ORAL_TABLET | Freq: Two times a day (BID) | ORAL | Status: DC

## 2023-12-20 MED ORDER — CLOTRIMAZOLE 1 % EX CREA
TOPICAL_CREAM | Freq: Two times a day (BID) | CUTANEOUS | Status: DC
  Filled 2023-12-20: qty 15

## 2023-12-20 MED ORDER — ROSUVASTATIN CALCIUM 10 MG PO TABS
10.0000 mg | ORAL_TABLET | Freq: Every day | ORAL | Status: DC
  Administered 2023-12-21: 10 mg via ORAL
  Filled 2023-12-20: qty 1

## 2023-12-20 MED ORDER — INSULIN ASPART 100 UNIT/ML IJ SOLN
0.0000 [IU] | INTRAMUSCULAR | Status: DC
  Administered 2023-12-20: 12 [IU] via SUBCUTANEOUS
  Administered 2023-12-20: 16 [IU] via SUBCUTANEOUS
  Filled 2023-12-20: qty 0.24

## 2023-12-20 MED ORDER — TRAZODONE HCL 50 MG PO TABS: 50.0000 mg | ORAL_TABLET | Freq: Every evening | ORAL | Status: DC | PRN

## 2023-12-20 MED ORDER — SODIUM CHLORIDE 0.9 % IV SOLN: INTRAVENOUS | Status: AC

## 2023-12-20 MED ORDER — ALBUTEROL SULFATE (2.5 MG/3ML) 0.083% IN NEBU: 2.5000 mg | INHALATION_SOLUTION | RESPIRATORY_TRACT | Status: DC | PRN

## 2023-12-20 MED ORDER — DIAZEPAM 5 MG PO TABS: 2.5000 mg | ORAL_TABLET | Freq: Three times a day (TID) | ORAL | Status: DC | PRN

## 2023-12-20 MED ORDER — ONDANSETRON HCL 4 MG PO TABS: 4.0000 mg | ORAL_TABLET | Freq: Four times a day (QID) | ORAL | Status: DC | PRN

## 2023-12-20 MED ORDER — FLUCONAZOLE 150 MG PO TABS
150.0000 mg | ORAL_TABLET | Freq: Once | ORAL | Status: AC
  Administered 2023-12-20: 150 mg via ORAL
  Filled 2023-12-20: qty 1

## 2023-12-20 MED ORDER — INSULIN GLARGINE-YFGN 100 UNIT/ML ~~LOC~~ SOLN
35.0000 [IU] | Freq: Every day | SUBCUTANEOUS | Status: DC
  Administered 2023-12-20 – 2023-12-21 (×2): 35 [IU] via SUBCUTANEOUS
  Filled 2023-12-20 (×2): qty 0.35

## 2023-12-20 MED ORDER — INSULIN ASPART 100 UNIT/ML IJ SOLN
0.0000 [IU] | Freq: Every day | INTRAMUSCULAR | Status: DC
  Administered 2023-12-20: 4 [IU] via SUBCUTANEOUS
  Filled 2023-12-20: qty 0.05

## 2023-12-20 MED ORDER — ONDANSETRON HCL 4 MG/2ML IJ SOLN: 4.0000 mg | Freq: Four times a day (QID) | INTRAMUSCULAR | Status: DC | PRN

## 2023-12-20 MED ORDER — STERILE WATER FOR INJECTION IJ SOLN
INTRAMUSCULAR | Status: AC
  Administered 2023-12-20: 10 mL
  Filled 2023-12-20: qty 10

## 2023-12-20 MED ORDER — ACETAMINOPHEN 325 MG PO TABS: 650.0000 mg | ORAL_TABLET | Freq: Four times a day (QID) | ORAL | Status: DC | PRN

## 2023-12-20 NOTE — ED Provider Notes (Signed)
 Care assumed from Dr. Kermit Ped.  Patient here with hyperglycemia.  He is a poor historian.  States he has been taking his insulin .  Found to have HHS but no DKA.  No acidosis or anion gap.  He told the nurse he has a rash on his penis for the past 3 days and difficulty making it to the bathroom with urinary frequency and urgency but no dysuria.  Appears to have Candida infection and possible early balanitis.  Will treat with antibiotics and antifungals.  Repeat labs are sent.  CT head is stable without acute findings.  Repeat labs show no anion gap.  Blood sugar improved to 400.  Will continue IV hydration.  Patient remains somewhat encephalopathic but CT head is negative for acute finding. Concern for HHS.  Continue IV fluids and insulin .  Observation admission discussed with Dr. Jeannene Milling.    Earma Gloss, MD 12/20/23 [phone]

## 2023-12-20 NOTE — H&P (Signed)
 History and Physical  Peter Manning HYQ:[id number] DOB: April 02, 1956 DOA: 12/19/2023  PCP: Earlis Glimpse, NP   Chief Complaint: Dizziness, polyuria  HPI: Peter Manning is a 68 y.o. male with medical history significant for insulin -dependent type 2 diabetes, medication noncompliance, OSA, hypertension, hyperlipidemia being admitted to the hospital with HHS.  Patient states for the last several days he has been having worsening dizziness, polyuria, and dry mouth.  Initially to the ER provider denied any other symptoms, but later admitted to nursing staff that he has a rash on the head of his penis.  He called EMS, was noted to have CBG 582, they gave him some fluid and he was evaluated in the ER.  Here, his blood sugar is improving after receiving fluids and subcutaneous insulin .  Review of Systems: Please see HPI for pertinent positives and negatives. A complete 10 system review of systems are otherwise negative.  Past Medical History:  Diagnosis Date   Bronchitis    Depression    Hepatitis 1976   pt. doesn't remember if A, B, C?   History of kidney stones    Hyperlipidemia    Hypertension    OSA (obstructive sleep apnea) 12/09/2011   PSG 01/03/12>>AHI 45.7, SpO2 low 73%, PLMI 0.  CPAP 15 cm H2O>>AHI 0, +R.    Pneumonia 2015   Renal disorder    had kidney stone   Sleep apnea    no CPAP ordered per patient/over 3 years   Past Surgical History:  Procedure Laterality Date   BACK SURGERY  2001   put in screws per pt.   COLONOSCOPY     CYSTOSCOPY WITH RETROGRADE PYELOGRAM, URETEROSCOPY AND STENT PLACEMENT Left 07/06/2017   Procedure: CYSTOSCOPY WITH LEFT RETROGRADE PYELOGRAM, URETEROSCOPY WITH HOLMIUM LASER BASKET EXTRACTION AND STENT PLACEMENT;  Surgeon: Homero Luster, MD;  Location: [facility];  Service: Urology;  Laterality: Left;   JOINT REPLACEMENT     REVISION TOTAL HIP ARTHROPLASTY Left 11/2015   TOTAL HIP ARTHROPLASTY     left hip   TOTAL HIP REVISION Left 11/18/2015    Procedure: LEFT TOTAL HIP REVISION;  Surgeon: Adah Acron, MD;  Location: [facility] OR;  Service: Orthopedics;  Laterality: Left;   TOTAL KNEE ARTHROPLASTY Right 10/10/2016   Procedure: TOTAL KNEE ARTHROPLASTY;  Surgeon: Adah Acron, MD;  Location: [facility] OR;  Service: Orthopedics;  Laterality: Right;   Social History:  reports that he has been smoking cigarettes. He has a 23 pack-year smoking history. He has never used smokeless tobacco. He reports that he does not drink alcohol and does not use drugs.  No Known Allergies  Family History  Problem Relation Age of Onset   Heart attack Father    Heart disease Father    Cancer Mother    Diabetes Sister    Diabetes Brother    Colon cancer Neg Hx    Esophageal cancer Neg Hx    Rectal cancer Neg Hx    Stomach cancer Neg Hx      Prior to Admission medications   Medication Sig Start Date End Date Taking? Authorizing Provider  metFORMIN  (GLUCOPHAGE ) 500 MG tablet Take 1 tablet (500 mg total) by mouth 2 (two) times daily with a meal. Patient taking differently: Take 1,000 mg by mouth daily. 08/29/18 11/02/28 Yes Macdonald Savoy, MD  oxyCODONE  (ROXICODONE ) 15 MG immediate release tablet Take 15 mg by mouth daily as needed for pain. 08/01/23  Yes [provider]  albuterol  (PROVENTIL  HFA;VENTOLIN  HFA) 108 (  90 Base) MCG/ACT inhaler Inhale 2 puffs into the lungs every 4 (four) hours as needed for wheezing or shortness of breath. 07/19/17   Lind Repine, MD  amLODipine  (NORVASC ) 10 MG tablet Take 10 mg by mouth daily.  08/24/16   [provider]  diazepam  (VALIUM ) 5 MG tablet Take 0.5 tablets (2.5 mg total) by mouth every 8 (eight) hours as needed for anxiety (spasms). 07/26/20   Mesner, Jason, MD  etodolac (LODINE XL) 400 MG 24 hr tablet Take 400 mg by mouth daily. 06/07/23   [provider]  finasteride  (PROSCAR ) 5 MG tablet Take 5 mg by mouth daily. Patient not taking: Reported on 08/10/2023 06/05/23   [provider]   gabapentin  (NEURONTIN ) 800 MG tablet Take 800 mg by mouth daily. 10/11/19   [provider]  hydrOXYzine  (ATARAX /VISTARIL ) 50 MG tablet Take 50 mg by mouth every 8 (eight) hours as needed for anxiety.  07/10/18   [provider]  insulin  aspart (NOVOLOG  PENFILL) cartridge Inject 3 Units into the skin 3 (three) times daily with meals. Patient not taking: Reported on 08/10/2023 08/29/18   Macdonald Savoy, MD  insulin  glargine (LANTUS ) 100 UNIT/ML Solostar Pen Inject 40 Units into the skin at bedtime. 08/14/23   Wouk, Haynes Lips, MD  Insulin  Pen Needle (PEN NEEDLES) 31G X 6 MM MISC 1 each by Does not apply route at bedtime. 08/14/23   Wouk, Haynes Lips, MD  Multiple Vitamin (MULTIVITAMIN PO) Take 1 tablet by mouth daily.    [provider]  ondansetron  (ZOFRAN -ODT) 4 MG disintegrating tablet 4mg  ODT q4 hours prn nausea/vomit 07/22/22   Pollina, Marine Sia, MD  rosuvastatin  (CRESTOR ) 10 MG tablet Take 10 mg by mouth daily. 06/10/23   [provider]  SYMBICORT 160-4.5 MCG/ACT inhaler Inhale 2 puffs into the lungs daily as needed (shortness of breath).  08/05/18   [provider]  tiZANidine  (ZANAFLEX ) 4 MG tablet Take 4 mg by mouth 3 (three) times daily as needed for muscle spasms. 07/31/23   [provider]  traZODone  (DESYREL ) 50 MG tablet Take 50 mg by mouth at bedtime as needed for sleep.  08/21/18   [provider]  varenicline (CHANTIX) 1 MG tablet Take 1 mg by mouth 2 (two) times daily. Patient not taking: Reported on 08/10/2023 06/30/23   [provider]    Physical Exam: BP 117/81   Pulse 74   Temp 98.7 F (37.1 C) (Oral)   Resp 18   Ht 5' 8 (1.727 m)   Wt 99.8 kg   SpO2 95%   BMI 33.45 kg/m  General:  Alert, oriented, calm, in no acute distress, staying awake, pleasant and cooperative Cardiovascular: RRR, no murmurs or rubs, no peripheral edema  Respiratory: clear to auscultation bilaterally, no wheezes, no  crackles  Abdomen: soft, nontender, nondistended, normal bowel tones heard  Skin: dry, no rashes  GU: Uncircumcised penis without developmental abnormality.  There is crusting on the head of the penis, no erythema, drainage.  No surrounding skin erythema or other evidence of cellulitis. Musculoskeletal: no joint effusions, normal range of motion  Psychiatric: appropriate affect, normal speech  Neurologic: extraocular muscles intact, clear speech, moving all extremities with intact sensorium         Labs on Admission:  Basic Metabolic Panel: Recent Labs  Lab 12/19/23 2158 12/20/23 0225 12/20/23 0515  NA 134* 133* 135  K 5.6* 4.5 4.0  CL 98 99 102  CO2 22 23 25   GLUCOSE  619* 407* 325*  BUN 33* 29* 27*  CREATININE 1.39* 1.26* 1.12  CALCIUM  10.8* 10.7* 10.4*  MG 2.5*  --   --    Liver Function Tests: No results for input(s): AST, ALT, ALKPHOS, BILITOT, PROT, ALBUMIN in the last 168 hours. No results for input(s): LIPASE, AMYLASE in the last 168 hours. No results for input(s): AMMONIA in the last 168 hours. CBC: Recent Labs  Lab 12/19/23 2158 12/20/23 0515  WBC 9.2 9.2  NEUTROABS 7.0  --   HGB 16.2 15.6  HCT 47.8 46.4  MCV 95.2 96.5  PLT 211 201   Cardiac Enzymes: No results for input(s): CKTOTAL, CKMB, CKMBINDEX, TROPONINI in the last 168 hours. BNP (last 3 results) No results for input(s): BNP in the last 8760 hours.  ProBNP (last 3 results) No results for input(s): PROBNP in the last 8760 hours.  CBG: Recent Labs  Lab 12/19/23 2150 12/20/23 0502  GLUCAP >600* 342*    Radiological Exams on Admission: CT Head Wo Contrast Result Date: 12/20/2023 CLINICAL DATA:  Mental status change, unknown cause EXAM: CT HEAD WITHOUT CONTRAST TECHNIQUE: Contiguous axial images were obtained from the base of the skull through the vertex without intravenous contrast. RADIATION DOSE REDUCTION: This exam was performed according to the departmental  dose-optimization program which includes automated exposure control, adjustment of the mA and/or kV according to patient size and/or use of iterative reconstruction technique. COMPARISON:  CT head 07/28/2020. FINDINGS: Brain: No evidence of acute infarction, hemorrhage, hydrocephalus, extra-axial collection or mass lesion/mass effect. Vascular: No hyperdense vessel. Skull: No acute fracture. Sinuses/Orbits: Clear sinuses.  No acute orbital findings. Other: No mastoid effusions. IMPRESSION: No evidence of acute intracranial abnormality. Electronically Signed   By: Stevenson Elbe M.D.   On: 12/20/2023 00:08   DG Chest Portable 1 View Result Date: 12/19/2023 EXAM: 1 VIEW XRAY OF THE CHEST 12/19/2023 11:34:00 PM COMPARISON: 08/09/2023 CLINICAL HISTORY: Generalized weakness, chest pain and shortness of breath that started today. FINDINGS: LUNGS AND PLEURA: No focal pulmonary opacity. No pulmonary edema. No pleural effusion. No pneumothorax. HEART AND MEDIASTINUM: No acute abnormality of the cardiac and mediastinal silhouettes. BONES AND SOFT TISSUES: No acute osseous abnormality. IMPRESSION: 1. No acute process. Electronically signed by: Zadie Herter MD 12/19/2023 11:42 PM EDT RP Workstation: [id number]   Assessment/Plan Peter Manning is a 68 y.o. male with medical history significant for insulin -dependent type 2 diabetes, medication noncompliance, OSA, hypertension, hyperlipidemia being admitted to the hospital with HHS  HHS-hyperglycemia with elevated beta hydroxybutyrate, no anion gap or evidence of acidosis.  Possibly due to balanitis, or potentially medication noncompliance. -Inpatient admission -Clear liquid diet -IV fluids -Continue basal bolus insulin  dosing -Continue to monitor BMP and beta hydroxybutyric acid  Balanitis-I suspect this is fungal, will start antifungal treatment, no clear indication for antibacterial. -Clotrimazole cream twice daily -Patient advised to maintain good  hygiene  Hypertension-likely continue amlodipine  in 24 hours if blood pressure elevated  Hyperlipidemia-Crestor   Neuropathy-gabapentin   Anxiety-Valium  as needed  DVT prophylaxis: Lovenox     Code Status: Full Code  Consults called: None  Admission status: The appropriate patient status for this patient is INPATIENT. Inpatient status is judged to be reasonable and necessary in order to provide the required intensity of service to ensure the patient's safety. The patient's presenting symptoms, physical exam findings, and initial radiographic and laboratory data in the context of their chronic comorbidities is felt to place them at high risk for further clinical deterioration. Furthermore, it is not anticipated that  the patient will be medically stable for discharge from the hospital within 2 midnights of admission.    I certify that at the point of admission it is my clinical judgment that the patient will require inpatient hospital care spanning beyond 2 midnights from the point of admission due to high intensity of service, high risk for further deterioration and high frequency of surveillance required  Time spent: 49 minutes  Jaiona Simien Rickey Charm MD [facility] Pager [phone]  If 7PM-7AM, please contact night-coverage [email or web address] Password [facility]  12/20/2023, 7:31 AM

## 2023-12-20 NOTE — ED Notes (Signed)
 Ginger ale provided.

## 2023-12-20 NOTE — Inpatient Diabetes Management (Signed)
 Inpatient Diabetes Program Recommendations  AACE/ADA: New Consensus Statement on Inpatient Glycemic Control (2015)  Target Ranges:  Prepandial:   less than 140 mg/dL      Peak postprandial:   less than 180 mg/dL (1-2 hours)      Critically ill patients:  140 - 180 mg/dL   Lab Results  Component Value Date   GLUCAP 232 (H) 12/20/2023   HGBA1C 10.8 (H) 08/10/2023    Review of Glycemic Control  Diabetes history: DM2 Outpatient Diabetes medications: Semglee  35 daily, Novolog  0-15 TID with meals and 0-5 HS Current orders for Inpatient glycemic control: Lantus  40-45 units at bedtime, metformin  1000 mg BID  Inpatient Diabetes Program Recommendations:    Need updated HgbA1C. Last one 10.8% on 08/10/2023.  Consider adding Novolog  6 units TID with meals if eating > 50%  Long discussion with pt regarding his diabetes control. States he's eating large quantities of food, drinks soda and juice, and knows he needs to improve. Discussed importance of checking CBGs and maintaining good CBG control to prevent long-term and short-term complications. Advised to monitor at least 2-3x/day and take logbook to PCP for review. Explained how hyperglycemia leads to damage within blood vessels which lead to the common complications seen with uncontrolled diabetes. Stressed to the patient the importance of improving glycemic control to prevent further complications from uncontrolled diabetes. Discussed impact of nutrition, exercise, stress, sickness, and medications on diabetes control.  Discussed carbohydrates, adding high fiber foods and portion control. Patient verbalized understanding of information discussed and reports no further questions at this time related to diabetes.  Continue to follow.  Thank you. Joni Net, RD, LDN, CDCES Inpatient Diabetes Coordinator [phone]

## 2023-12-21 LAB — CBC
HCT: 45.1 % (ref 39.0–52.0)
Hemoglobin: 15.2 g/dL (ref 13.0–17.0)
MCH: 33.1 pg (ref 26.0–34.0)
MCHC: 33.7 g/dL (ref 30.0–36.0)
MCV: 98.3 fL (ref 80.0–100.0)
Platelets: 183 10*3/uL (ref 150–400)
RBC: 4.59 MIL/uL (ref 4.22–5.81)
RDW: 11.8 % (ref 11.5–15.5)
WBC: 7 10*3/uL (ref 4.0–10.5)
nRBC: 0 % (ref 0.0–0.2)

## 2023-12-21 LAB — BASIC METABOLIC PANEL WITH GFR
Anion gap: 10 (ref 5–15)
BUN: 21 mg/dL (ref 8–23)
CO2: 22 mmol/L (ref 22–32)
Calcium: 9.8 mg/dL (ref 8.9–10.3)
Chloride: 99 mmol/L (ref 98–111)
Creatinine, Ser: 1.26 mg/dL — ABNORMAL HIGH (ref 0.61–1.24)
GFR, Estimated: >60 mL/min (ref >60)
Glucose, Bld: 379 mg/dL — ABNORMAL HIGH (ref 70–99)
Potassium: 4.6 mmol/L (ref 3.5–5.1)
Sodium: 131 mmol/L — ABNORMAL LOW (ref 135–145)

## 2023-12-21 LAB — GLUCOSE, CAPILLARY
Glucose-Capillary: 306 mg/dL — ABNORMAL HIGH (ref 70–99)
Glucose-Capillary: 340 mg/dL — ABNORMAL HIGH (ref 70–99)
Glucose-Capillary: 349 mg/dL — ABNORMAL HIGH (ref 70–99)

## 2023-12-21 MED ORDER — LIVING WELL WITH DIABETES BOOK
Freq: Once | Status: AC
  Filled 2023-12-21: qty 1

## 2023-12-21 NOTE — Plan of Care (Signed)
 Pt advised not to leave, risks of hyperglycemia explained. Pt verbalized understanding and signed AMA paper. IV dc'd. Pt left by ambulation.

## 2023-12-21 NOTE — Discharge Summary (Signed)
 Physician Discharge Summary   Patient: Peter Manning MRN: [id number] DOB: 1956/04/17  Admit date:     12/19/2023  Discharge date: 12/21/23  Discharge Physician: Vernona Goods Anesa Fronek   PCP: Earlis Glimpse, NP    Discharge Diagnoses: Principal Problem:   Hyperglycemia  Resolved Problems:   * No resolved hospital problems. *  Hospital Course: Peter Manning is a 68 y.o. male with medical history significant for insulin -dependent type 2 diabetes, medication noncompliance, OSA, hypertension, hyperlipidemia being admitted to the hospital with HHS.  Patient states for the last several days he has been having worsening dizziness, polyuria, and dry mouth.  Initially to the ER provider denied any other symptoms, but later admitted to nursing staff that he has a rash on the head of his penis.  He called EMS, was noted to have CBG 582, he was started on IV fluids and subcutaneous insulin . Patient left  AMA prior to complete management.  See details of hospitalization below  HHS-hyperglycemia with elevated beta hydroxybutyrate, no anion gap or evidence of acidosis.   Likely from  medication noncompliance. -Blood glucose improving check hemoglobin A1c -Inpatient admission - Diabetic diet -IV fluids, monitor blood sugars per protocol -Continue basal bolus insulin  dosing -Continue to monitor BMP and beta hydroxybutyric acid   Pseudohyponatremia-in the setting of hyperglycemia. Monitor sodium as blood sugar improves Follow BMP   Balanitis-I suspect this is fungal, will start antifungal treatment, no clear indication for antibacterial. -Clotrimazole cream twice daily -Patient advised to maintain good hygiene   Hypertension-likely continue amlodipine     Hyperlipidemia-Crestor    Neuropathy-gabapentin    Anxiety-Valium  as needed   Obesity-Class 1 Lifestyle and dietary modifications.   Encephalopathy-Ruled out.          DISCHARGE MEDICATION: Allergies as of 12/21/2023   No Known  Allergies      Medication List     TAKE these medications    albuterol  108 (90 Base) MCG/ACT inhaler Commonly known as: VENTOLIN  HFA Inhale 2 puffs into the lungs every 4 (four) hours as needed for wheezing or shortness of breath.   amLODipine  10 MG tablet Commonly known as: NORVASC  Take 10 mg by mouth daily.   diazepam  5 MG tablet Commonly known as: Valium  Take 0.5 tablets (2.5 mg total) by mouth every 8 (eight) hours as needed for anxiety (spasms).   etodolac 400 MG 24 hr tablet Commonly known as: LODINE XL Take 400 mg by mouth daily.   finasteride  5 MG tablet Commonly known as: PROSCAR  Take 5 mg by mouth daily.   gabapentin  800 MG tablet Commonly known as: NEURONTIN  Take 800 mg by mouth 2 (two) times daily as needed (nerve pain).   hydrOXYzine  50 MG tablet Commonly known as: ATARAX  Take 50 mg by mouth every 8 (eight) hours as needed for anxiety.   insulin  glargine 100 UNIT/ML Solostar Pen Commonly known as: LANTUS  Inject 40 Units into the skin at bedtime. What changed:  how much to take when to take this reasons to take this   insulin  glargine-yfgn 100 UNIT/ML Pen Commonly known as: SEMGLEE  SMARTSIG:40 Unit(s) SUB-Q Every Night   metFORMIN  1000 MG tablet Commonly known as: GLUCOPHAGE  Take 1,000 mg by mouth 2 (two) times daily with a meal.   oxyCODONE  15 MG immediate release tablet Commonly known as: ROXICODONE  Take 15 mg by mouth in the morning and at bedtime.   Pen Needles 31G X 6 MM Misc 1 each by Does not apply route at bedtime.   Symbicort 160-4.5 MCG/ACT inhaler Generic drug:  budesonide-formoterol  Inhale 2 puffs into the lungs 2 (two) times daily as needed (shortness of breath).   tiZANidine  4 MG tablet Commonly known as: ZANAFLEX  Take 4 mg by mouth 3 (three) times daily as needed for muscle spasms.   Vitamin D (Ergocalciferol) 1.25 MG (50000 UNIT) Caps capsule Commonly known as: DRISDOL Take 50,000 Units by mouth once a week.         Discharge Exam: Filed Weights   12/19/23 2149  Weight: 99.8 kg  General:  Alert, oriented, calm, in no acute distress, staying awake, pleasant and cooperative Cardiovascular: RRR, no murmurs or rubs, no peripheral edema  Respiratory: clear to auscultation bilaterally, no wheezes, no crackles  Abdomen: soft, nontender, nondistended, normal bowel tones heard  Skin: dry, no rashes  GU: Uncircumcised penis without developmental abnormality.  There is crusting on the head of the penis, no erythema, drainage.  No surrounding skin erythema or other evidence of cellulitis. Musculoskeletal: no joint effusions, normal range of motion  Psychiatric: appropriate affect, normal speech  Neurologic: extraocular muscles intact, clear speech, moving all extremities with intact sensorium   Condition at discharge: good  The results of significant diagnostics from this hospitalization (including imaging, microbiology, ancillary and laboratory) are listed below for reference.   Imaging Studies: CT Head Wo Contrast Result Date: 12/20/2023 CLINICAL DATA:  Mental status change, unknown cause EXAM: CT HEAD WITHOUT CONTRAST TECHNIQUE: Contiguous axial images were obtained from the base of the skull through the vertex without intravenous contrast. RADIATION DOSE REDUCTION: This exam was performed according to the departmental dose-optimization program which includes automated exposure control, adjustment of the mA and/or kV according to patient size and/or use of iterative reconstruction technique. COMPARISON:  CT head 07/28/2020. FINDINGS: Brain: No evidence of acute infarction, hemorrhage, hydrocephalus, extra-axial collection or mass lesion/mass effect. Vascular: No hyperdense vessel. Skull: No acute fracture. Sinuses/Orbits: Clear sinuses.  No acute orbital findings. Other: No mastoid effusions. IMPRESSION: No evidence of acute intracranial abnormality. Electronically Signed   By: Stevenson Elbe M.D.   On:  12/20/2023 00:08   DG Chest Portable 1 View Result Date: 12/19/2023 EXAM: 1 VIEW XRAY OF THE CHEST 12/19/2023 11:34:00 PM COMPARISON: 08/09/2023 CLINICAL HISTORY: Generalized weakness, chest pain and shortness of breath that started today. FINDINGS: LUNGS AND PLEURA: No focal pulmonary opacity. No pulmonary edema. No pleural effusion. No pneumothorax. HEART AND MEDIASTINUM: No acute abnormality of the cardiac and mediastinal silhouettes. BONES AND SOFT TISSUES: No acute osseous abnormality. IMPRESSION: 1. No acute process. Electronically signed by: Zadie Herter MD 12/19/2023 11:42 PM EDT RP Workstation: [id number]    Microbiology: Results for orders placed or performed during the hospital encounter of 08/09/23  Resp panel by RT-PCR (RSV, Flu A&B, Covid) Anterior Nasal Swab     Status: None   Collection Time: 08/10/23  1:13 AM   Specimen: Anterior Nasal Swab  Result Value Ref Range Status   SARS Coronavirus 2 by RT PCR NEGATIVE NEGATIVE Final    Comment: (NOTE) SARS-CoV-2 target nucleic acids are NOT DETECTED.  The SARS-CoV-2 RNA is generally detectable in upper respiratory specimens during the acute phase of infection. The lowest concentration of SARS-CoV-2 viral copies this assay can detect is 138 copies/mL. A negative result does not preclude SARS-Cov-2 infection and should not be used as the sole basis for treatment or other patient management decisions. A negative result may occur with  improper specimen collection/handling, submission of specimen other than nasopharyngeal swab, presence of viral mutation(s) within the areas targeted by this  assay, and inadequate number of viral copies(<138 copies/mL). A negative result must be combined with clinical observations, patient history, and epidemiological information. The expected result is Negative.  Fact Sheet for Patients:  BloggerCourse.com  Fact Sheet for Healthcare Providers:   SeriousBroker.it  This test is no t yet approved or cleared by the [place]  FDA and  has been authorized for detection and/or diagnosis of SARS-CoV-2 by FDA under an Emergency Use Authorization (EUA). This EUA will remain  in effect (meaning this test can be used) for the duration of the COVID-19 declaration under Section 564(b)(1) of the Act, 21 U.S.C.section 360bbb-3(b)(1), unless the authorization is terminated  or revoked sooner.       Influenza A by PCR NEGATIVE NEGATIVE Final   Influenza B by PCR NEGATIVE NEGATIVE Final    Comment: (NOTE) The Xpert Xpress SARS-CoV-2/FLU/RSV plus assay is intended as an aid in the diagnosis of influenza from Nasopharyngeal swab specimens and should not be used as a sole basis for treatment. Nasal washings and aspirates are unacceptable for Xpert Xpress SARS-CoV-2/FLU/RSV testing.  Fact Sheet for Patients: BloggerCourse.com  Fact Sheet for Healthcare Providers: SeriousBroker.it  This test is not yet approved or cleared by the [place]  FDA and has been authorized for detection and/or diagnosis of SARS-CoV-2 by FDA under an Emergency Use Authorization (EUA). This EUA will remain in effect (meaning this test can be used) for the duration of the COVID-19 declaration under Section 564(b)(1) of the Act, 21 U.S.C. section 360bbb-3(b)(1), unless the authorization is terminated or revoked.     Resp Syncytial Virus by PCR NEGATIVE NEGATIVE Final    Comment: (NOTE) Fact Sheet for Patients: BloggerCourse.com  Fact Sheet for Healthcare Providers: SeriousBroker.it  This test is not yet approved or cleared by the [place]  FDA and has been authorized for detection and/or diagnosis of SARS-CoV-2 by FDA under an Emergency Use Authorization (EUA). This EUA will remain in effect (meaning this test can be used) for  the duration of the COVID-19 declaration under Section 564(b)(1) of the Act, 21 U.S.C. section 360bbb-3(b)(1), unless the authorization is terminated or revoked.  Performed at [facility], [address] [place], [place] [address]   MRSA Next Gen by PCR, Nasal     Status: None   Collection Time: 08/10/23  8:00 AM   Specimen: Nasal Mucosa; Nasal Swab  Result Value Ref Range Status   MRSA by PCR Next Gen NOT DETECTED NOT DETECTED Final    Comment: (NOTE) The GeneXpert MRSA Assay (FDA approved for NASAL specimens only), is one component of a comprehensive MRSA colonization surveillance program. It is not intended to diagnose MRSA infection nor to guide or monitor treatment for MRSA infections. Test performance is not FDA approved in patients less than 61 years old. Performed at [facility], [address] [place], [place] [address]     Labs: CBC: Recent Labs  Lab 12/19/23 2158 12/20/23 0515 12/21/23 0405  WBC 9.2 9.2 7.0  NEUTROABS 7.0  --   --   HGB 16.2 15.6 15.2  HCT 47.8 46.4 45.1  MCV 95.2 96.5 98.3  PLT 211 201 183   Basic Metabolic Panel: Recent Labs  Lab 12/19/23 2158 12/20/23 0225 12/20/23 0515 12/20/23 0924 12/20/23 1410 12/21/23 0405  NA 134* 133* 135 137 135 131*  K 5.6* 4.5 4.0 3.8 4.3 4.6  CL 98 99 102 102 103 99  CO2 22 23 25 25 24 22   GLUCOSE 619* 407* 325*  215* 285* 379*  BUN 33* 29* 27* 25* 22 21  CREATININE 1.39* 1.26* 1.12 1.02 1.15 1.26*  CALCIUM  10.8* 10.7* 10.4* 10.4* 10.2 9.8  MG 2.5*  --   --   --   --   --    Liver Function Tests: No results for input(s): AST, ALT, ALKPHOS, BILITOT, PROT, ALBUMIN in the last 168 hours. CBG: Recent Labs  Lab 12/20/23 1717 12/20/23 2051 12/21/23 0733 12/21/23 1129 12/21/23 1244  GLUCAP 307* 330* 306* 340* 349*    Discharge time spent: greater than 30 minutes.  Signed: Volney Grumbles, MD [facility] 12/21/2023

## 2023-12-21 NOTE — Progress Notes (Signed)
   12/21/23 0022  BiPAP/CPAP/SIPAP  $ Non-Invasive Home Ventilator  Initial  BiPAP/CPAP/SIPAP Pt Type Adult  BiPAP/CPAP/SIPAP Resmed  Mask Type Nasal mask  Dentures removed? Not applicable  Mask Size Small  Respiratory Rate 20 breaths/min  FiO2 (%) 21 %  Patient Home Mask No  Patient Home Tubing No  Auto Titrate Yes  Minimum cmH2O 5 cmH2O  Maximum cmH2O 20 cmH2O  CPAP/SIPAP surface wiped down Yes  Device Plugged into RED Power Outlet Yes  BiPAP/CPAP /SiPAP Vitals  Resp (!) 21  MEWS Score/Color  MEWS Score 1  MEWS Score Color Peter Manning

## 2023-12-21 NOTE — Progress Notes (Addendum)
  Progress Note   Patient: Peter Manning ZOX:[id number] DOB: 02/24/1956 DOA: 12/19/2023     1 DOS: the patient was seen and examined on 12/21/2023   Brief hospital course: Donold Marotto is a 68 y.o. male with medical history significant for insulin -dependent type 2 diabetes, medication noncompliance, OSA, hypertension, hyperlipidemia being admitted to the hospital with HHS.  Patient states for the last several days he has been having worsening dizziness, polyuria, and dry mouth.  Initially to the ER provider denied any other symptoms, but later admitted to nursing staff that he has a rash on the head of his penis.  He called EMS, was noted to have CBG 582, he was started on IV fluids and subcutaneous insulin .     Assessment and Plan: Darshan Solanki is a 68 y.o. male with medical history significant for insulin -dependent type 2 diabetes, medication noncompliance, OSA, hypertension, hyperlipidemia being admitted to the hospital with HHS   HHS-hyperglycemia with elevated beta hydroxybutyrate, no anion gap or evidence of acidosis.   Likely from  medication noncompliance. -Blood glucose improving check hemoglobin A1c -Inpatient admission - Diabetic diet -IV fluids, monitor blood sugars per protocol -Continue basal bolus insulin  dosing -Continue to monitor BMP and beta hydroxybutyric acid  Pseudohyponatremia-in the setting of hyperglycemia. Monitor sodium as blood sugar improves Follow BMP   Balanitis-I suspect this is fungal, will start antifungal treatment, no clear indication for antibacterial. -Clotrimazole cream twice daily -Patient advised to maintain good hygiene   Hypertension-likely continue amlodipine    Hyperlipidemia-Crestor    Neuropathy-gabapentin    Anxiety-Valium  as needed  Obesity-Class 1 Lifestyle and dietary modifications.  Encephalopathy-Ruled out.   Subjective:  Seen at bedside, he has no complaints, BG are still elevated to 300s. He is wanting to go home  regardless.  Physical Exam: Vitals:   12/20/23 2051 12/21/23 0022 12/21/23 0023 12/21/23 0356  BP: 102/64  101/65 119/67  Pulse: 73  73 72  Resp:  (!) 21    Temp: 98.5 F (36.9 C)  98 F (36.7 C) 98.2 F (36.8 C)  TempSrc: Oral  Oral Oral  SpO2: 98%  96% 95%  Weight:      Height:       General:  Alert, oriented, calm, in no acute distress, staying awake, pleasant and cooperative Cardiovascular: RRR, no murmurs or rubs, no peripheral edema  Respiratory: clear to auscultation bilaterally, no wheezes, no crackles  Abdomen: soft, nontender, nondistended, normal bowel tones heard  Skin: dry, no rashes  GU: Uncircumcised penis without developmental abnormality.  There is crusting on the head of the penis, no erythema, drainage.  No surrounding skin erythema or other evidence of cellulitis. Musculoskeletal: no joint effusions, normal range of motion  Psychiatric: appropriate affect, normal speech  Neurologic: extraocular muscles intact, clear speech, moving all extremities with intact sensorium Data Reviewed:  There are no new results to review at this time.   Time spent: 35 minutes  Author: Volney Grumbles, MD 12/21/2023 9:50 AM  For on call review [email or web address].

## 2023-12-21 NOTE — Progress Notes (Signed)
   12/21/23 1028  TOC Brief Assessment  Insurance and Status Reviewed  Patient has primary care physician Yes  Home environment has been reviewed Resides alone in an apartment  Prior level of function: Independent with ADLs at baseline  Prior/Current Home Services No current home services  Social Drivers of Health Review SDOH reviewed no interventions necessary  Readmission risk has been reviewed Yes  Transition of care needs no transition of care needs at this time

## 2024-02-05 ENCOUNTER — Encounter: Payer: Self-pay | Admitting: Plastic Surgery

## 2024-02-05 ENCOUNTER — Ambulatory Visit (INDEPENDENT_AMBULATORY_CARE_PROVIDER_SITE_OTHER): Admitting: Plastic Surgery

## 2024-02-05 VITALS — BP 111/72 | HR 75 | Ht 68.5 in | Wt 225.6 lb

## 2024-02-05 DIAGNOSIS — L989 Disorder of the skin and subcutaneous tissue, unspecified: Secondary | ICD-10-CM

## 2024-02-05 NOTE — Progress Notes (Signed)
 Referring Provider Leron Millman, NP [address] [place],  [place] [address]   CC:  Chief Complaint  Patient presents with   Consult      Peter Manning is an 68 y.o. male.  HPI: Mr. Peter Manning is a 68 year old male who presents today with complaints of a multiyear history of a skin lesion on the lateral portion of his right eye.  He states that this has been a long number but he has noted that it has been growing recently.  He would like to have it excised.  He denies any significant medical problems other than diabetes.  He is insulin -dependent.  He denies any shortness of breath or chest pain with normal daily activities.  He is not on any blood thinners.  No Known Allergies  Outpatient Encounter Medications as of 02/05/2024  Medication Sig Note   albuterol  (PROVENTIL  HFA;VENTOLIN  HFA) 108 (90 Base) MCG/ACT inhaler Inhale 2 puffs into the lungs every 4 (four) hours as needed for wheezing or shortness of breath.    amLODipine  (NORVASC ) 10 MG tablet Take 10 mg by mouth daily.     diazepam  (VALIUM ) 5 MG tablet Take 0.5 tablets (2.5 mg total) by mouth every 8 (eight) hours as needed for anxiety (spasms). 12/20/2023: Pt is adamant he has this medication at home and has been taking it PRN.    etodolac (LODINE XL) 400 MG 24 hr tablet Take 400 mg by mouth daily.    finasteride  (PROSCAR ) 5 MG tablet Take 5 mg by mouth daily.    gabapentin  (NEURONTIN ) 800 MG tablet Take 800 mg by mouth 2 (two) times daily as needed (nerve pain).    hydrOXYzine  (ATARAX /VISTARIL ) 50 MG tablet Take 50 mg by mouth every 8 (eight) hours as needed for anxiety.     insulin  glargine (LANTUS ) 100 UNIT/ML Solostar Pen Inject 40 Units into the skin at bedtime. (Patient taking differently: Inject 45 Units into the skin daily as needed (high blood sugar).) 12/20/2023: Pt is adamant he is using this medication PRN.    insulin  glargine-yfgn (SEMGLEE ) 100 UNIT/ML Pen SMARTSIG:40 Unit(s) SUB-Q Every Night    Insulin  Pen Needle (PEN  NEEDLES) 31G X 6 MM MISC 1 each by Does not apply route at bedtime.    metFORMIN  (GLUCOPHAGE ) 1000 MG tablet Take 1,000 mg by mouth 2 (two) times daily with a meal.    oxyCODONE  (ROXICODONE ) 15 MG immediate release tablet Take 15 mg by mouth in the morning and at bedtime.    SYMBICORT 160-4.5 MCG/ACT inhaler Inhale 2 puffs into the lungs 2 (two) times daily as needed (shortness of breath).    tiZANidine  (ZANAFLEX ) 4 MG tablet Take 4 mg by mouth 3 (three) times daily as needed for muscle spasms.    Vitamin D, Ergocalciferol, (DRISDOL) 1.25 MG (50000 UNIT) CAPS capsule Take 50,000 Units by mouth once a week. 12/20/2023: Has not taken in 2 weeks due to running out. Has not picked the refill up.    No facility-administered encounter medications on file as of 02/05/2024.     Past Medical History:  Diagnosis Date   Bronchitis    Depression    Hepatitis 1976   pt. doesn't remember if A, B, C?   History of kidney stones    Hyperlipidemia    Hypertension    OSA (obstructive sleep apnea) 12/09/2011   PSG 01/03/12>>AHI 45.7, SpO2 low 73%, PLMI 0.  CPAP 15 cm H2O>>AHI 0, +R.    Pneumonia 2015   Renal disorder  had kidney stone   Sleep apnea    no CPAP ordered per patient/over 3 years    Past Surgical History:  Procedure Laterality Date   BACK SURGERY  2001   put in screws per pt.   COLONOSCOPY     CYSTOSCOPY WITH RETROGRADE PYELOGRAM, URETEROSCOPY AND STENT PLACEMENT Left 07/06/2017   Procedure: CYSTOSCOPY WITH LEFT RETROGRADE PYELOGRAM, URETEROSCOPY WITH HOLMIUM LASER BASKET EXTRACTION AND STENT PLACEMENT;  Surgeon: Watt Rush, MD;  Location: [facility];  Service: Urology;  Laterality: Left;   JOINT REPLACEMENT     REVISION TOTAL HIP ARTHROPLASTY Left 11/2015   TOTAL HIP ARTHROPLASTY     left hip   TOTAL HIP REVISION Left 11/18/2015   Procedure: LEFT TOTAL HIP REVISION;  Surgeon: Oneil JAYSON Herald, MD;  Location: [facility] OR;  Service: Orthopedics;  Laterality: Left;   TOTAL KNEE  ARTHROPLASTY Right 10/10/2016   Procedure: TOTAL KNEE ARTHROPLASTY;  Surgeon: Oneil JAYSON Herald, MD;  Location: [facility] OR;  Service: Orthopedics;  Laterality: Right;    Family History  Problem Relation Age of Onset   Heart attack Father    Heart disease Father    Cancer Mother    Diabetes Sister    Diabetes Brother    Colon cancer Neg Hx    Esophageal cancer Neg Hx    Rectal cancer Neg Hx    Stomach cancer Neg Hx     Social History   Social History Narrative   Not on file     Review of Systems General: Denies fevers, chills, weight loss CV: Denies chest pain, shortness of breath, palpitations Skin: Skin lesion at the lateral portion of the right eye  Physical Exam    02/05/2024    8:02 AM 12/21/2023   12:48 PM 12/21/2023    3:56 AM  Vitals with BMI  Height 5' 8.5    Weight 225 lbs 10 oz    BMI 33.8    Systolic 111 129 880  Diastolic 72 83 67  Pulse 75 76 72    General:  No acute distress,  Alert and oriented, Non-Toxic, Normal speech and affect Skin: Patient has a lesion approximately 5 mm in size at the lateral canthus of the right eye.  The lesion is mobile pedunculated but does not appear to be attached to the lower eyelid.   Assessment/Plan Skin lesion: 5 mm benign-appearing skin lesion.  Due to the fact that it is on the lower lid I have asked him that we do this in the operating room in case there is a need for reconstruction lower lid.  He is in agreement.  All questions were answered.  He understands that there is the possibility of needing to do additional procedures based on pathology.  Photographs were obtained today with his consent.  Will submit him for excision of the skin lesion at his request.  [name] 02/05/2024, 8:19 AM

## 2024-02-22 ENCOUNTER — Encounter: Payer: Self-pay | Admitting: Surgical

## 2024-02-22 ENCOUNTER — Encounter: Admitting: Surgical

## 2024-02-22 ENCOUNTER — Ambulatory Visit: Admitting: Surgical

## 2024-02-22 VITALS — BP 94/65 | HR 68 | Ht 68.5 in | Wt 218.4 lb

## 2024-02-22 DIAGNOSIS — L989 Disorder of the skin and subcutaneous tissue, unspecified: Secondary | ICD-10-CM

## 2024-02-22 NOTE — Progress Notes (Addendum)
 Patient ID: Peter Manning, male    DOB: [birth year], 68 y.o.   MRN: [id number]  Chief Complaint  Patient presents with   Pre-op Exam      ICD-10-CM   1. Skin lesion  L98.9        History of Present Illness: Peter Manning is a 68 y.o.  male  with a history of right eye lesion.  He presents for preoperative evaluation for upcoming procedure, excision of right thigh skin lesion, scheduled for 03/15/2024 with Dr. Waddell.  The patient has not had problems with anesthesia. No history of DVT/PE.  No family history of DVT/PE.  No family or personal history of bleeding or clotting disorders.  Patient is not currently taking any blood thinners.  No history of CVA/MI.   Patient specifically denies any cardiac or pulmonary symptoms, does not have any cardiac or pulmonary disease.  He does have a history of OSA, reports no longer having OSA.  He does have a history of chronic bronchitis, he reports he has decreased his smoking and this has helped.  He does have a history of hepatitis, per EMR review not sure if it is A B or C.  He does have a history of type 2 diabetes mellitus with most recent A1c 6 months ago 10.8.  Patient is an insulin -dependent diabetic as well as on metformin .  Patient was hospitalized in June for hyperglycemia, prior to this he was hospitalized in February for hyperglycemia.  He has not had orthopedic surgery as well as a hernia repair and reports he tolerated this well without any complications.  Summary of Previous Visit: 68 year old male who presents today with complaints of a multiyear history of a skin lesion on the lateral portion of his right eye. He states that this has been a long number but he has noted that it has been growing recently. He would like to have it excised. He denies any significant medical problems other than diabetes. He is insulin -dependent. He denies any shortness of breath or chest pain with normal daily activities. He is not on any blood  thinners. PCP clearance not requested at consult per EMR review.  Past medical history: Chronic bronchitis, hyperlipidemia, hypertension, sleep apnea, type 2 diabetes mellitus with A1c greater than 10.  Past Medical History: Allergies: No Known Allergies  Current Medications:  Current Outpatient Medications:    Accu-Chek Softclix Lancets lancets, 3 (three) times daily., Disp: , Rfl:    albuterol  (PROVENTIL  HFA;VENTOLIN  HFA) 108 (90 Base) MCG/ACT inhaler, Inhale 2 puffs into the lungs every 4 (four) hours as needed for wheezing or shortness of breath., Disp: 1 Inhaler, Rfl: 0   amLODipine  (NORVASC ) 10 MG tablet, Take 10 mg by mouth daily. , Disp: , Rfl:    Blood Glucose Monitoring Suppl (ACCU-CHEK GUIDE ME) w/Device KIT, 3 (three) times daily., Disp: , Rfl:    diazepam  (VALIUM ) 5 MG tablet, Take 0.5 tablets (2.5 mg total) by mouth every 8 (eight) hours as needed for anxiety (spasms)., Disp: 10 tablet, Rfl: 0   etodolac (LODINE XL) 400 MG 24 hr tablet, Take 400 mg by mouth daily., Disp: , Rfl:    finasteride  (PROSCAR ) 5 MG tablet, Take 5 mg by mouth daily., Disp: , Rfl:    gabapentin  (NEURONTIN ) 800 MG tablet, Take 800 mg by mouth 2 (two) times daily as needed (nerve pain)., Disp: , Rfl:    hydrOXYzine  (ATARAX /VISTARIL ) 50 MG tablet, Take 50 mg by mouth every 8 (eight) hours as needed  for anxiety. , Disp: , Rfl:    insulin  glargine (LANTUS ) 100 UNIT/ML Solostar Pen, Inject 40 Units into the skin at bedtime. (Patient taking differently: Inject 45 Units into the skin daily as needed (high blood sugar).), Disp: 15 mL, Rfl: 1   insulin  glargine-yfgn (SEMGLEE ) 100 UNIT/ML Pen, SMARTSIG:40 Unit(s) SUB-Q Every Night, Disp: , Rfl:    Insulin  Pen Needle (PEN NEEDLES) 31G X 6 MM MISC, 1 each by Does not apply route at bedtime., Disp: 90 each, Rfl: 1   metFORMIN  (GLUCOPHAGE ) 1000 MG tablet, Take 1,000 mg by mouth 2 (two) times daily with a meal., Disp: , Rfl:    oxyCODONE  (ROXICODONE ) 15 MG immediate  release tablet, Take 15 mg by mouth in the morning and at bedtime., Disp: , Rfl:    SYMBICORT 160-4.5 MCG/ACT inhaler, Inhale 2 puffs into the lungs 2 (two) times daily as needed (shortness of breath)., Disp: , Rfl:    tiZANidine  (ZANAFLEX ) 4 MG tablet, Take 4 mg by mouth 3 (three) times daily as needed for muscle spasms., Disp: , Rfl:    Vitamin D, Ergocalciferol, (DRISDOL) 1.25 MG (50000 UNIT) CAPS capsule, Take 50,000 Units by mouth once a week., Disp: , Rfl:   Past Medical Problems: Past Medical History:  Diagnosis Date   Bronchitis    Depression    Hepatitis 1976   pt. doesn't remember if A, B, C?   History of kidney stones    Hyperlipidemia    Hypertension    OSA (obstructive sleep apnea) 12/09/2011   PSG 01/03/12>>AHI 45.7, SpO2 low 73%, PLMI 0.  CPAP 15 cm H2O>>AHI 0, +R.    Pneumonia 2015   Renal disorder    had kidney stone   Sleep apnea    no CPAP ordered per patient/over 3 years    Past Surgical History: Past Surgical History:  Procedure Laterality Date   BACK SURGERY  2001   put in screws per pt.   COLONOSCOPY     CYSTOSCOPY WITH RETROGRADE PYELOGRAM, URETEROSCOPY AND STENT PLACEMENT Left 07/06/2017   Procedure: CYSTOSCOPY WITH LEFT RETROGRADE PYELOGRAM, URETEROSCOPY WITH HOLMIUM LASER BASKET EXTRACTION AND STENT PLACEMENT;  Surgeon: Watt Rush, MD;  Location: [facility];  Service: Urology;  Laterality: Left;   JOINT REPLACEMENT     REVISION TOTAL HIP ARTHROPLASTY Left 11/2015   TOTAL HIP ARTHROPLASTY     left hip   TOTAL HIP REVISION Left 11/18/2015   Procedure: LEFT TOTAL HIP REVISION;  Surgeon: Oneil JAYSON Herald, MD;  Location: [facility];  Service: Orthopedics;  Laterality: Left;   TOTAL KNEE ARTHROPLASTY Right 10/10/2016   Procedure: TOTAL KNEE ARTHROPLASTY;  Surgeon: Oneil JAYSON Herald, MD;  Location: [facility];  Service: Orthopedics;  Laterality: Right;    Social History: Social History   Socioeconomic History   Marital status: Single    Spouse name: Not on  file   Number of children: Not on file   Years of education: Not on file   Highest education level: Not on file  Occupational History   Not on file  Tobacco Use   Smoking status: Some Days    Current packs/day: 0.50    Average packs/day: 0.5 packs/day for 46.0 years (23.0 ttl pk-yrs)    Types: Cigarettes   Smokeless tobacco: Never   Tobacco comments:    states on and off smoker  Vaping Use   Vaping status: Never Used  Substance and Sexual Activity   Alcohol use: No   Drug use: No  Sexual activity: Not on file  Other Topics Concern   Not on file  Social History Narrative   Not on file   Social Drivers of Health   Financial Resource Strain: Not on file  Food Insecurity: No Food Insecurity (12/20/2023)   Hunger Vital Sign    Worried About Running Out of Food in the Last Year: Never true    Ran Out of Food in the Last Year: Never true  Transportation Needs: No Transportation Needs (12/20/2023)   PRAPARE - Administrator, Civil Service (Medical): No    Lack of Transportation (Non-Medical): No  Physical Activity: Not on file  Stress: Not on file  Social Connections: Patient Declined (12/20/2023)   Social Connection and Isolation Panel    Frequency of Communication with Friends and Family: Patient declined    Frequency of Social Gatherings with Friends and Family: Patient declined    Attends Religious Services: Patient declined    Database administrator or Organizations: Patient declined    Attends Banker Meetings: Patient declined    Marital Status: Patient declined  Intimate Partner Violence: Not At Risk (12/20/2023)   Humiliation, Afraid, Rape, and Kick questionnaire    Fear of Current or Ex-Partner: No    Emotionally Abused: No    Physically Abused: No    Sexually Abused: No    Family History: Family History  Problem Relation Age of Onset   Heart attack Father    Heart disease Father    Cancer Mother    Diabetes Sister    Diabetes  Brother    Colon cancer Neg Hx    Esophageal cancer Neg Hx    Rectal cancer Neg Hx    Stomach cancer Neg Hx     Review of Systems: Review of Systems  Constitutional: Negative.   Respiratory: Negative.    Cardiovascular: Negative.   Gastrointestinal: Negative.   Neurological: Negative.     Physical Exam: Vital Signs BP 94/65 (BP Location: Left Arm, Patient Position: Sitting, Cuff Size: Large)   Pulse 68   Ht 5' 8.5 (1.74 m)   Wt 218 lb 6.4 oz (99.1 kg)   SpO2 97%   BMI 32.72 kg/m   Physical Exam Constitutional:      General: Not in acute distress.    Appearance: Normal appearance. Not ill-appearing.  HENT:     Head: Normocephalic and atraumatic.  Eyes:     Pupils: Pupils are equal, round Neck:     Musculoskeletal: Normal range of motion.  Cardiovascular:     Rate and Rhythm: Normal rate    Pulses: Normal pulses.  Pulmonary:     Effort: Pulmonary effort is normal. No respiratory distress.  Abdominal:     General: Abdomen is flat. There is no distension.  Musculoskeletal: Normal range of motion.  Skin:    General: Skin is warm and dry.     Findings: No erythema or rash.  Neurological:     General: No focal deficit present.     Mental Status: Alert and oriented to person, place, and time. Mental status is at baseline.     Motor: No weakness.  Psychiatric:        Mood and Affect: Mood normal.        Behavior: Behavior normal.    Assessment/Plan: The patient is scheduled for excision of right eye skin lesion with Dr. Waddell.  Risks, benefits, and alternatives of procedure discussed, questions answered and consent obtained.  Smoking Status: Current everyday smoker, reports smoking about 2 cigarettes/day.; Counseling Given?  Discussed increased risk of complications related to nicotine  use  Caprini Score: 7; Risk Factors include: Age, current cigarette smoker, history of insulin -dependent diabetes, BMI > 25, and length of planned surgery. Recommendation for  mechanical prophylaxis. Encourage early ambulation.   Pictures obtained: @consult   Post-op Rx sent to pharmacy: Zofran , patient is on chronic Narcotics.  Discussed adding Tylenol  for pain control.  Patient was provided with the General Surgical Risk consent document and Pain Medication Agreement prior to their appointment.  They had adequate time to read through the risk consent documents and Pain Medication Agreement. We also discussed them in person together during this preop appointment. All of their questions were answered to their satisfaction.  Recommended calling if they have any further questions.  Risk consent form and Pain Medication Agreement to be scanned into patient's chart.  Discussed with patient risks of excision of right eye skin lesion, discussed risks of bleeding, bruising, swelling of right eye for prolonged period of time due to location of procedure.  Discussed with patient he is at an increased risk of complications due to nicotine  use and elevated A1c, increased risk of infection.  Discussed patient's case with Dr. Waddell who is aware of elevated A1c and agreeable to proceed.  Electronically signed by: Donnice PARAS Bethanee Redondo, PA-C 02/22/2024 2:41 PM

## 2024-02-22 NOTE — H&P (View-Only) (Signed)
 Patient ID: Peter Manning, male    DOB: [birth year], 68 y.o.   MRN: [id number]  Chief Complaint  Patient presents with   Pre-op Exam      ICD-10-CM   1. Skin lesion  L98.9        History of Present Illness: Peter Manning is a 68 y.o.  male  with a history of right eye lesion.  He presents for preoperative evaluation for upcoming procedure, excision of right thigh skin lesion, scheduled for 03/15/2024 with Dr. Waddell.  The patient has not had problems with anesthesia. No history of DVT/PE.  No family history of DVT/PE.  No family or personal history of bleeding or clotting disorders.  Patient is not currently taking any blood thinners.  No history of CVA/MI.   Patient specifically denies any cardiac or pulmonary symptoms, does not have any cardiac or pulmonary disease.  He does have a history of OSA, reports no longer having OSA.  He does have a history of chronic bronchitis, he reports he has decreased his smoking and this has helped.  He does have a history of hepatitis, per EMR review not sure if it is A B or C.  He does have a history of type 2 diabetes mellitus with most recent A1c 6 months ago 10.8.  Patient is an insulin -dependent diabetic as well as on metformin .  Patient was hospitalized in June for hyperglycemia, prior to this he was hospitalized in February for hyperglycemia.  He has not had orthopedic surgery as well as a hernia repair and reports he tolerated this well without any complications.  Summary of Previous Visit: 68 year old male who presents today with complaints of a multiyear history of a skin lesion on the lateral portion of his right eye. He states that this has been a long number but he has noted that it has been growing recently. He would like to have it excised. He denies any significant medical problems other than diabetes. He is insulin -dependent. He denies any shortness of breath or chest pain with normal daily activities. He is not on any blood  thinners. PCP clearance not requested at consult per EMR review.  Past medical history: Chronic bronchitis, hyperlipidemia, hypertension, sleep apnea, type 2 diabetes mellitus with A1c greater than 10.  Past Medical History: Allergies: No Known Allergies  Current Medications:  Current Outpatient Medications:    Accu-Chek Softclix Lancets lancets, 3 (three) times daily., Disp: , Rfl:    albuterol  (PROVENTIL  HFA;VENTOLIN  HFA) 108 (90 Base) MCG/ACT inhaler, Inhale 2 puffs into the lungs every 4 (four) hours as needed for wheezing or shortness of breath., Disp: 1 Inhaler, Rfl: 0   amLODipine  (NORVASC ) 10 MG tablet, Take 10 mg by mouth daily. , Disp: , Rfl:    Blood Glucose Monitoring Suppl (ACCU-CHEK GUIDE ME) w/Device KIT, 3 (three) times daily., Disp: , Rfl:    diazepam  (VALIUM ) 5 MG tablet, Take 0.5 tablets (2.5 mg total) by mouth every 8 (eight) hours as needed for anxiety (spasms)., Disp: 10 tablet, Rfl: 0   etodolac (LODINE XL) 400 MG 24 hr tablet, Take 400 mg by mouth daily., Disp: , Rfl:    finasteride  (PROSCAR ) 5 MG tablet, Take 5 mg by mouth daily., Disp: , Rfl:    gabapentin  (NEURONTIN ) 800 MG tablet, Take 800 mg by mouth 2 (two) times daily as needed (nerve pain)., Disp: , Rfl:    hydrOXYzine  (ATARAX /VISTARIL ) 50 MG tablet, Take 50 mg by mouth every 8 (eight) hours as needed  for anxiety. , Disp: , Rfl:    insulin  glargine (LANTUS ) 100 UNIT/ML Solostar Pen, Inject 40 Units into the skin at bedtime. (Patient taking differently: Inject 45 Units into the skin daily as needed (high blood sugar).), Disp: 15 mL, Rfl: 1   insulin  glargine-yfgn (SEMGLEE ) 100 UNIT/ML Pen, SMARTSIG:40 Unit(s) SUB-Q Every Night, Disp: , Rfl:    Insulin  Pen Needle (PEN NEEDLES) 31G X 6 MM MISC, 1 each by Does not apply route at bedtime., Disp: 90 each, Rfl: 1   metFORMIN  (GLUCOPHAGE ) 1000 MG tablet, Take 1,000 mg by mouth 2 (two) times daily with a meal., Disp: , Rfl:    oxyCODONE  (ROXICODONE ) 15 MG immediate  release tablet, Take 15 mg by mouth in the morning and at bedtime., Disp: , Rfl:    SYMBICORT 160-4.5 MCG/ACT inhaler, Inhale 2 puffs into the lungs 2 (two) times daily as needed (shortness of breath)., Disp: , Rfl:    tiZANidine  (ZANAFLEX ) 4 MG tablet, Take 4 mg by mouth 3 (three) times daily as needed for muscle spasms., Disp: , Rfl:    Vitamin D, Ergocalciferol, (DRISDOL) 1.25 MG (50000 UNIT) CAPS capsule, Take 50,000 Units by mouth once a week., Disp: , Rfl:   Past Medical Problems: Past Medical History:  Diagnosis Date   Bronchitis    Depression    Hepatitis 1976   pt. doesn't remember if A, B, C?   History of kidney stones    Hyperlipidemia    Hypertension    OSA (obstructive sleep apnea) 12/09/2011   PSG 01/03/12>>AHI 45.7, SpO2 low 73%, PLMI 0.  CPAP 15 cm H2O>>AHI 0, +R.    Pneumonia 2015   Renal disorder    had kidney stone   Sleep apnea    no CPAP ordered per patient/over 3 years    Past Surgical History: Past Surgical History:  Procedure Laterality Date   BACK SURGERY  2001   put in screws per pt.   COLONOSCOPY     CYSTOSCOPY WITH RETROGRADE PYELOGRAM, URETEROSCOPY AND STENT PLACEMENT Left 07/06/2017   Procedure: CYSTOSCOPY WITH LEFT RETROGRADE PYELOGRAM, URETEROSCOPY WITH HOLMIUM LASER BASKET EXTRACTION AND STENT PLACEMENT;  Surgeon: Watt Rush, MD;  Location: [facility];  Service: Urology;  Laterality: Left;   JOINT REPLACEMENT     REVISION TOTAL HIP ARTHROPLASTY Left 11/2015   TOTAL HIP ARTHROPLASTY     left hip   TOTAL HIP REVISION Left 11/18/2015   Procedure: LEFT TOTAL HIP REVISION;  Surgeon: Oneil JAYSON Herald, MD;  Location: [facility];  Service: Orthopedics;  Laterality: Left;   TOTAL KNEE ARTHROPLASTY Right 10/10/2016   Procedure: TOTAL KNEE ARTHROPLASTY;  Surgeon: Oneil JAYSON Herald, MD;  Location: [facility];  Service: Orthopedics;  Laterality: Right;    Social History: Social History   Socioeconomic History   Marital status: Single    Spouse name: Not on  file   Number of children: Not on file   Years of education: Not on file   Highest education level: Not on file  Occupational History   Not on file  Tobacco Use   Smoking status: Some Days    Current packs/day: 0.50    Average packs/day: 0.5 packs/day for 46.0 years (23.0 ttl pk-yrs)    Types: Cigarettes   Smokeless tobacco: Never   Tobacco comments:    states on and off smoker  Vaping Use   Vaping status: Never Used  Substance and Sexual Activity   Alcohol use: No   Drug use: No  Sexual activity: Not on file  Other Topics Concern   Not on file  Social History Narrative   Not on file   Social Drivers of Health   Financial Resource Strain: Not on file  Food Insecurity: No Food Insecurity (12/20/2023)   Hunger Vital Sign    Worried About Running Out of Food in the Last Year: Never true    Ran Out of Food in the Last Year: Never true  Transportation Needs: No Transportation Needs (12/20/2023)   PRAPARE - Administrator, Civil Service (Medical): No    Lack of Transportation (Non-Medical): No  Physical Activity: Not on file  Stress: Not on file  Social Connections: Patient Declined (12/20/2023)   Social Connection and Isolation Panel    Frequency of Communication with Friends and Family: Patient declined    Frequency of Social Gatherings with Friends and Family: Patient declined    Attends Religious Services: Patient declined    Database administrator or Organizations: Patient declined    Attends Banker Meetings: Patient declined    Marital Status: Patient declined  Intimate Partner Violence: Not At Risk (12/20/2023)   Humiliation, Afraid, Rape, and Kick questionnaire    Fear of Current or Ex-Partner: No    Emotionally Abused: No    Physically Abused: No    Sexually Abused: No    Family History: Family History  Problem Relation Age of Onset   Heart attack Father    Heart disease Father    Cancer Mother    Diabetes Sister    Diabetes  Brother    Colon cancer Neg Hx    Esophageal cancer Neg Hx    Rectal cancer Neg Hx    Stomach cancer Neg Hx     Review of Systems: Review of Systems  Constitutional: Negative.   Respiratory: Negative.    Cardiovascular: Negative.   Gastrointestinal: Negative.   Neurological: Negative.     Physical Exam: Vital Signs BP 94/65 (BP Location: Left Arm, Patient Position: Sitting, Cuff Size: Large)   Pulse 68   Ht 5' 8.5 (1.74 m)   Wt 218 lb 6.4 oz (99.1 kg)   SpO2 97%   BMI 32.72 kg/m   Physical Exam Constitutional:      General: Not in acute distress.    Appearance: Normal appearance. Not ill-appearing.  HENT:     Head: Normocephalic and atraumatic.  Eyes:     Pupils: Pupils are equal, round Neck:     Musculoskeletal: Normal range of motion.  Cardiovascular:     Rate and Rhythm: Normal rate    Pulses: Normal pulses.  Pulmonary:     Effort: Pulmonary effort is normal. No respiratory distress.  Abdominal:     General: Abdomen is flat. There is no distension.  Musculoskeletal: Normal range of motion.  Skin:    General: Skin is warm and dry.     Findings: No erythema or rash.  Neurological:     General: No focal deficit present.     Mental Status: Alert and oriented to person, place, and time. Mental status is at baseline.     Motor: No weakness.  Psychiatric:        Mood and Affect: Mood normal.        Behavior: Behavior normal.    Assessment/Plan: The patient is scheduled for excision of right eye skin lesion with Dr. Waddell.  Risks, benefits, and alternatives of procedure discussed, questions answered and consent obtained.  Smoking Status: Current everyday smoker, reports smoking about 2 cigarettes/day.; Counseling Given?  Discussed increased risk of complications related to nicotine  use  Caprini Score: 7; Risk Factors include: Age, current cigarette smoker, history of insulin -dependent diabetes, BMI > 25, and length of planned surgery. Recommendation for  mechanical prophylaxis. Encourage early ambulation.   Pictures obtained: @consult   Post-op Rx sent to pharmacy: Zofran , patient is on chronic Narcotics.  Discussed adding Tylenol  for pain control.  Patient was provided with the General Surgical Risk consent document and Pain Medication Agreement prior to their appointment.  They had adequate time to read through the risk consent documents and Pain Medication Agreement. We also discussed them in person together during this preop appointment. All of their questions were answered to their satisfaction.  Recommended calling if they have any further questions.  Risk consent form and Pain Medication Agreement to be scanned into patient's chart.  Discussed with patient risks of excision of right eye skin lesion, discussed risks of bleeding, bruising, swelling of right eye for prolonged period of time due to location of procedure.  Discussed with patient he is at an increased risk of complications due to nicotine  use and elevated A1c, increased risk of infection.  Discussed patient's case with Dr. Waddell who is aware of elevated A1c and agreeable to proceed.  Electronically signed by: Donnice PARAS Bethanee Redondo, PA-C 02/22/2024 2:41 PM

## 2024-03-08 ENCOUNTER — Encounter (HOSPITAL_BASED_OUTPATIENT_CLINIC_OR_DEPARTMENT_OTHER): Payer: Self-pay | Admitting: Plastic Surgery

## 2024-03-08 ENCOUNTER — Other Ambulatory Visit: Payer: Self-pay

## 2024-03-14 ENCOUNTER — Encounter (HOSPITAL_BASED_OUTPATIENT_CLINIC_OR_DEPARTMENT_OTHER)
Admission: RE | Admit: 2024-03-14 | Discharge: 2024-03-14 | Disposition: A | Discharge status: Home / Self Care | Source: Ambulatory Visit | Attending: Plastic Surgery | Admitting: Plastic Surgery

## 2024-03-14 DIAGNOSIS — F17210 Nicotine dependence, cigarettes, uncomplicated: Secondary | ICD-10-CM | POA: Diagnosis not present

## 2024-03-14 DIAGNOSIS — L989 Disorder of the skin and subcutaneous tissue, unspecified: Secondary | ICD-10-CM | POA: Diagnosis present

## 2024-03-14 DIAGNOSIS — E119 Type 2 diabetes mellitus without complications: Secondary | ICD-10-CM | POA: Diagnosis not present

## 2024-03-14 DIAGNOSIS — Z7984 Long term (current) use of oral hypoglycemic drugs: Secondary | ICD-10-CM | POA: Diagnosis not present

## 2024-03-14 DIAGNOSIS — I10 Essential (primary) hypertension: Secondary | ICD-10-CM | POA: Diagnosis not present

## 2024-03-14 DIAGNOSIS — Z794 Long term (current) use of insulin: Secondary | ICD-10-CM | POA: Diagnosis not present

## 2024-03-14 DIAGNOSIS — H02823 Cysts of right eye, unspecified eyelid: Secondary | ICD-10-CM | POA: Diagnosis not present

## 2024-03-14 LAB — BASIC METABOLIC PANEL WITH GFR
Anion gap: 10 (ref 5–15)
BUN: 13 mg/dL (ref 8–23)
CO2: 24 mmol/L (ref 22–32)
Calcium: 10 mg/dL (ref 8.9–10.3)
Chloride: 104 mmol/L (ref 98–111)
Creatinine, Ser: 1.29 mg/dL — ABNORMAL HIGH (ref 0.61–1.24)
GFR, Estimated: >60 mL/min (ref >60)
Glucose, Bld: 257 mg/dL — ABNORMAL HIGH (ref 70–99)
Potassium: 4.2 mmol/L (ref 3.5–5.1)
Sodium: 138 mmol/L (ref 135–145)

## 2024-03-14 MED ORDER — CHLORHEXIDINE GLUCONATE CLOTH 2 % EX PADS: 6.0000 | MEDICATED_PAD | Freq: Once | CUTANEOUS | Status: DC

## 2024-03-14 MED ORDER — CHLORHEXIDINE GLUCONATE CLOTH 2 % EX PADS
6.0000 | MEDICATED_PAD | Freq: Once | CUTANEOUS | Status: DC
Start: 2024-03-14 — End: 2024-03-15

## 2024-03-14 NOTE — Progress Notes (Signed)

## 2024-03-14 NOTE — Anesthesia Preprocedure Evaluation (Signed)
 Anesthesia Evaluation  Patient identified by MRN, date of birth, ID band Patient awake    Reviewed: Allergy & Precautions, NPO status , Patient's Chart, lab work & pertinent test results  History of Anesthesia Complications (+) history of anesthetic complications  Airway Mallampati: III  TM Distance: >3 FB Neck ROM: Full    Dental no notable dental hx. (+) Missing, Dental Advisory Given, Poor Dentition   Pulmonary sleep apnea , Current SmokerPatient did not abstain from smoking.   Pulmonary exam normal breath sounds clear to auscultation       Cardiovascular hypertension, Pt. on medications (-) angina (-) Past MI Normal cardiovascular exam Rhythm:Regular Rate:Normal     Neuro/Psych  PSYCHIATRIC DISORDERS  Depression       GI/Hepatic ,,,(+) Hepatitis -  Endo/Other  diabetes, Type 2, Oral Hypoglycemic Agents    Renal/GU Renal disease     Musculoskeletal  (+) Arthritis ,    Abdominal   Peds  Hematology   Anesthesia Other Findings   Reproductive/Obstetrics                              Anesthesia Physical Anesthesia Plan  ASA: 3  Anesthesia Plan: General   Post-op Pain Management: Ofirmev  IV (intra-op)*   Induction: Intravenous  PONV Risk Score and Plan: 2 and Treatment may vary due to age or medical condition, Ondansetron  and Midazolam   Airway Management Planned: LMA  Additional Equipment: None  Intra-op Plan:   Post-operative Plan: Extubation in OR  Informed Consent: I have reviewed the patients History and Physical, chart, labs and discussed the procedure including the risks, benefits and alternatives for the proposed anesthesia with the patient or authorized representative who has indicated his/her understanding and acceptance.     Dental advisory given  Plan Discussed with: CRNA and Surgeon  Anesthesia Plan Comments:         Anesthesia Quick Evaluation

## 2024-03-15 ENCOUNTER — Encounter (HOSPITAL_BASED_OUTPATIENT_CLINIC_OR_DEPARTMENT_OTHER): Payer: Self-pay | Admitting: Plastic Surgery

## 2024-03-15 ENCOUNTER — Ambulatory Visit (HOSPITAL_BASED_OUTPATIENT_CLINIC_OR_DEPARTMENT_OTHER): Admitting: Anesthesiology

## 2024-03-15 ENCOUNTER — Other Ambulatory Visit: Payer: Self-pay

## 2024-03-15 ENCOUNTER — Ambulatory Visit (HOSPITAL_BASED_OUTPATIENT_CLINIC_OR_DEPARTMENT_OTHER)
Admission: RE | Admit: 2024-03-15 | Discharge: 2024-03-15 | Disposition: A | Discharge status: Home / Self Care | Attending: Plastic Surgery | Admitting: Plastic Surgery

## 2024-03-15 ENCOUNTER — Encounter (HOSPITAL_BASED_OUTPATIENT_CLINIC_OR_DEPARTMENT_OTHER)
Admission: RE | Disposition: A | Discharge status: Home / Self Care | Payer: Self-pay | Source: Home / Self Care | Attending: Plastic Surgery

## 2024-03-15 DIAGNOSIS — H02823 Cysts of right eye, unspecified eyelid: Secondary | ICD-10-CM | POA: Insufficient documentation

## 2024-03-15 DIAGNOSIS — G4733 Obstructive sleep apnea (adult) (pediatric): Secondary | ICD-10-CM | POA: Diagnosis not present

## 2024-03-15 DIAGNOSIS — D23112 Other benign neoplasm of skin of right lower eyelid, including canthus: Secondary | ICD-10-CM | POA: Diagnosis not present

## 2024-03-15 DIAGNOSIS — Z794 Long term (current) use of insulin: Secondary | ICD-10-CM | POA: Insufficient documentation

## 2024-03-15 DIAGNOSIS — E119 Type 2 diabetes mellitus without complications: Secondary | ICD-10-CM | POA: Insufficient documentation

## 2024-03-15 DIAGNOSIS — H029 Unspecified disorder of eyelid: Secondary | ICD-10-CM | POA: Diagnosis not present

## 2024-03-15 DIAGNOSIS — I10 Essential (primary) hypertension: Secondary | ICD-10-CM

## 2024-03-15 DIAGNOSIS — Z7984 Long term (current) use of oral hypoglycemic drugs: Secondary | ICD-10-CM | POA: Insufficient documentation

## 2024-03-15 DIAGNOSIS — F17210 Nicotine dependence, cigarettes, uncomplicated: Secondary | ICD-10-CM | POA: Insufficient documentation

## 2024-03-15 HISTORY — DX: Patient's other noncompliance with medication regimen for other reason: Z91.148

## 2024-03-15 HISTORY — DX: Type 2 diabetes mellitus without complications: E11.9

## 2024-03-15 HISTORY — PX: EXCISION MASS HEAD: SHX6702

## 2024-03-15 LAB — GLUCOSE, CAPILLARY
Glucose-Capillary: 134 mg/dL — ABNORMAL HIGH (ref 70–99)
Glucose-Capillary: 121 mg/dL — ABNORMAL HIGH (ref 70–99)

## 2024-03-15 SURGERY — EXCISION, MASS, HEAD
Anesthesia: General | Site: Eye | Laterality: Right

## 2024-03-15 MED ORDER — FENTANYL CITRATE (PF) 100 MCG/2ML IJ SOLN
INTRAMUSCULAR | Status: DC | PRN
  Administered 2024-03-15: 50 ug via INTRAVENOUS

## 2024-03-15 MED ORDER — DEXAMETHASONE SODIUM PHOSPHATE 10 MG/ML IJ SOLN
INTRAMUSCULAR | Status: AC
  Filled 2024-03-15: qty 1

## 2024-03-15 MED ORDER — BSS IO SOLN
INTRAOCULAR | Status: DC | PRN
  Administered 2024-03-15: 15 mL

## 2024-03-15 MED ORDER — LIDOCAINE HCL (CARDIAC) PF 100 MG/5ML IV SOSY
PREFILLED_SYRINGE | INTRAVENOUS | Status: DC | PRN
  Administered 2024-03-15: 50 mg via INTRAVENOUS

## 2024-03-15 MED ORDER — EPHEDRINE 5 MG/ML INJ
INTRAVENOUS | Status: AC
Start: 2024-03-15 — End: 2024-03-15
  Filled 2024-03-15: qty 5

## 2024-03-15 MED ORDER — ATROPINE SULFATE 0.4 MG/ML IV SOLN
INTRAVENOUS | Status: AC
  Filled 2024-03-15: qty 1

## 2024-03-15 MED ORDER — ONDANSETRON HCL 4 MG/2ML IJ SOLN
INTRAMUSCULAR | Status: DC | PRN
  Administered 2024-03-15: 4 mg via INTRAVENOUS

## 2024-03-15 MED ORDER — MIDAZOLAM HCL 2 MG/2ML IJ SOLN
INTRAMUSCULAR | Status: AC
  Filled 2024-03-15: qty 2

## 2024-03-15 MED ORDER — FENTANYL CITRATE (PF) 100 MCG/2ML IJ SOLN: 25.0000 ug | INTRAMUSCULAR | Status: DC | PRN

## 2024-03-15 MED ORDER — ACETAMINOPHEN 10 MG/ML IV SOLN: 1000.0000 mg | Freq: Once | INTRAVENOUS | Status: DC | PRN

## 2024-03-15 MED ORDER — ONDANSETRON HCL 4 MG/2ML IJ SOLN: 4.0000 mg | Freq: Once | INTRAMUSCULAR | Status: DC | PRN

## 2024-03-15 MED ORDER — WHITE PETROLATUM EX OINT
TOPICAL_OINTMENT | CUTANEOUS | Status: AC
Start: 2024-03-15 — End: 2024-03-15
  Filled 2024-03-15: qty 28.35

## 2024-03-15 MED ORDER — SUCCINYLCHOLINE CHLORIDE 200 MG/10ML IV SOSY
PREFILLED_SYRINGE | INTRAVENOUS | Status: AC
  Filled 2024-03-15: qty 10

## 2024-03-15 MED ORDER — PHENYLEPHRINE 80 MCG/ML (10ML) SYRINGE FOR IV PUSH (FOR BLOOD PRESSURE SUPPORT)
PREFILLED_SYRINGE | INTRAVENOUS | Status: AC
  Filled 2024-03-15: qty 10

## 2024-03-15 MED ORDER — MIDAZOLAM HCL 5 MG/5ML IJ SOLN
INTRAMUSCULAR | Status: DC | PRN
  Administered 2024-03-15: 1 mg via INTRAVENOUS

## 2024-03-15 MED ORDER — LIDOCAINE 2% (20 MG/ML) 5 ML SYRINGE
INTRAMUSCULAR | Status: AC
Start: 2024-03-15 — End: 2024-03-15
  Filled 2024-03-15: qty 5

## 2024-03-15 MED ORDER — LACTATED RINGERS IV SOLN: INTRAVENOUS | Status: DC

## 2024-03-15 MED ORDER — ACETAMINOPHEN 10 MG/ML IV SOLN
INTRAVENOUS | Status: AC
  Filled 2024-03-15: qty 100

## 2024-03-15 MED ORDER — BUPIVACAINE-EPINEPHRINE 0.25% -1:200000 IJ SOLN
INTRAMUSCULAR | Status: DC | PRN
  Administered 2024-03-15: 3 mL

## 2024-03-15 MED ORDER — DEXAMETHASONE SODIUM PHOSPHATE 4 MG/ML IJ SOLN
INTRAMUSCULAR | Status: DC | PRN
  Administered 2024-03-15: 4 mg via INTRAVENOUS

## 2024-03-15 MED ORDER — BACITRACIN ZINC 500 UNIT/GM EX OINT
TOPICAL_OINTMENT | CUTANEOUS | Status: AC
  Filled 2024-03-15: qty 0.9

## 2024-03-15 MED ORDER — EPHEDRINE SULFATE (PRESSORS) 50 MG/ML IJ SOLN
INTRAMUSCULAR | Status: DC | PRN
Start: 2024-03-15 — End: 2024-03-15
  Administered 2024-03-15: 10 mg via INTRAVENOUS

## 2024-03-15 MED ORDER — BSS IO SOLN
INTRAOCULAR | Status: AC
Start: 2024-03-15 — End: 2024-03-15
  Filled 2024-03-15: qty 15

## 2024-03-15 MED ORDER — FENTANYL CITRATE (PF) 100 MCG/2ML IJ SOLN
INTRAMUSCULAR | Status: AC
  Filled 2024-03-15: qty 2

## 2024-03-15 MED ORDER — ONDANSETRON HCL 4 MG/2ML IJ SOLN
INTRAMUSCULAR | Status: AC
  Filled 2024-03-15: qty 2

## 2024-03-15 MED ORDER — PROPOFOL 10 MG/ML IV BOLUS
INTRAVENOUS | Status: DC | PRN
  Administered 2024-03-15: 200 mg via INTRAVENOUS

## 2024-03-15 MED ORDER — ACETAMINOPHEN 10 MG/ML IV SOLN
INTRAVENOUS | Status: DC | PRN
  Administered 2024-03-15: 1000 mg via INTRAVENOUS

## 2024-03-15 MED ORDER — WHITE PETROLATUM EX OINT
TOPICAL_OINTMENT | CUTANEOUS | Status: DC | PRN
  Administered 2024-03-15: 1 via TOPICAL

## 2024-03-15 SURGICAL SUPPLY — 71 items
BLADE CLIPPER SURG (BLADE) IMPLANT
BLADE SURG 15 STRL LF DISP TIS (BLADE) ×2 IMPLANT
BNDG ELASTIC 2INX 5YD STR LF (GAUZE/BANDAGES/DRESSINGS) IMPLANT
BNDG GAUZE DERMACEA FLUFF 4 (GAUZE/BANDAGES/DRESSINGS) IMPLANT
CANISTER SUCT 1200ML W/VALVE (MISCELLANEOUS) IMPLANT
CLSR STERI-STRIP ANTIMIC 1/2X4 (GAUZE/BANDAGES/DRESSINGS) IMPLANT
CORD BIPOLAR FORCEPS 12FT (ELECTRODE) IMPLANT
COVER BACK TABLE 60X90IN (DRAPES) ×2 IMPLANT
COVER MAYO STAND STRL (DRAPES) ×2 IMPLANT
DERMABOND ADVANCED .7 DNX12 (GAUZE/BANDAGES/DRESSINGS) IMPLANT
DRAPE LAPAROTOMY T 102X78X121 (DRAPES) IMPLANT
DRAPE U-SHAPE 76X120 STRL (DRAPES) ×1 IMPLANT
DRSG ADAPTIC 3X8 NADH LF (GAUZE/BANDAGES/DRESSINGS) IMPLANT
DRSG EMULSION OIL 3X3 NADH (GAUZE/BANDAGES/DRESSINGS) IMPLANT
ELECT COATED BLADE 2.86 ST (ELECTRODE) IMPLANT
ELECT NDL BLADE 2-5/6 (NEEDLE) IMPLANT
ELECT NEEDLE BLADE 2-5/6 (NEEDLE) ×2 IMPLANT
ELECTRODE REM PT RETRN 9FT PED (ELECTROSURGICAL) IMPLANT
ELECTRODE REM PT RTRN 9FT ADLT (ELECTROSURGICAL) ×1 IMPLANT
GAUZE PAD ABD 8X10 STRL (GAUZE/BANDAGES/DRESSINGS) IMPLANT
GAUZE SPONGE 2X2 STRL 8-PLY (GAUZE/BANDAGES/DRESSINGS) IMPLANT
GAUZE SPONGE 4X4 12PLY STRL LF (GAUZE/BANDAGES/DRESSINGS) IMPLANT
GAUZE STRETCH 2X75IN STRL (MISCELLANEOUS) IMPLANT
GAUZE XEROFORM 1X8 LF (GAUZE/BANDAGES/DRESSINGS) IMPLANT
GAUZE XEROFORM 5X9 LF (GAUZE/BANDAGES/DRESSINGS) IMPLANT
GLOVE BIO SURGEON STRL SZ7.5 (GLOVE) ×2 IMPLANT
GLOVE BIO SURGEON STRL SZ8 (GLOVE) ×2 IMPLANT
GLOVE BIOGEL PI IND STRL 6.5 (GLOVE) ×1 IMPLANT
GLOVE BIOGEL PI IND STRL 7.0 (GLOVE) ×1 IMPLANT
GLOVE BIOGEL PI IND STRL 8 (GLOVE) ×3 IMPLANT
GLOVE SURG SS PI 6.5 STRL IVOR (GLOVE) ×1 IMPLANT
GOWN STRL REUS W/ TWL LRG LVL3 (GOWN DISPOSABLE) ×2 IMPLANT
GOWN STRL REUS W/TWL XL LVL3 (GOWN DISPOSABLE) ×3 IMPLANT
HIBICLENS CHG 4% 4OZ BTL (MISCELLANEOUS) ×1 IMPLANT
NDL HYPO 30GX1 BEV (NEEDLE) IMPLANT
NDL PRECISIONGLIDE 27X1.5 (NEEDLE) IMPLANT
NDL SPNL 18GX3.5 QUINCKE PK (NEEDLE) IMPLANT
NEEDLE HYPO 30GX1 BEV (NEEDLE) ×2 IMPLANT
NEEDLE PRECISIONGLIDE 27X1.5 (NEEDLE) IMPLANT
NEEDLE SPNL 18GX3.5 QUINCKE PK (NEEDLE) IMPLANT
NS IRRIG 1000ML POUR BTL (IV SOLUTION) ×1 IMPLANT
PACK BASIN DAY SURGERY FS (CUSTOM PROCEDURE TRAY) ×2 IMPLANT
PENCIL SMOKE EVACUATOR (MISCELLANEOUS) ×2 IMPLANT
SHEET MEDIUM DRAPE 40X70 STRL (DRAPES) IMPLANT
SLEEVE SCD COMPRESS KNEE MED (STOCKING) ×1 IMPLANT
STAPLER SKIN PROX WIDE 3.9 (STAPLE) IMPLANT
STRIP CLOSURE SKIN 1/2X4 (GAUZE/BANDAGES/DRESSINGS) IMPLANT
STRIP SUTURE WOUND CLOSURE 1/2 (MISCELLANEOUS) IMPLANT
SUCTION TUBE FRAZIER 10FR DISP (SUCTIONS) ×1 IMPLANT
SUT CHROMIC 4 0 P 3 18 (SUTURE) IMPLANT
SUT CHROMIC 5 0 P 3 (SUTURE) IMPLANT
SUT ETHILON 4 0 P 3 18 (SUTURE) IMPLANT
SUT ETHILON 4 0 PS 2 18 (SUTURE) IMPLANT
SUT MNCRL 6-0 UNDY P1 1X18 (SUTURE) IMPLANT
SUT MNCRL AB 3-0 PS2 18 (SUTURE) IMPLANT
SUT MNCRL AB 4-0 PS2 18 (SUTURE) IMPLANT
SUT MON AB 5-0 P3 18 (SUTURE) IMPLANT
SUT NYLON ETHILON 5-0 P-3 1X18 (SUTURE) IMPLANT
SUT PDS II 3-0 CT2 27 ABS (SUTURE) IMPLANT
SUT PLAIN GUT FAST 5-0 (SUTURE) ×1 IMPLANT
SUT PROLENE 4 0 PS 2 18 (SUTURE) IMPLANT
SUT PROLENE 5 0 P 3 (SUTURE) IMPLANT
SUT VIC AB 3-0 SH 27X BRD (SUTURE) IMPLANT
SUT VIC AB 4-0 PS2 18 (SUTURE) IMPLANT
SUT VIC AB 5-0 P-3 18X BRD (SUTURE) IMPLANT
SUT VICRYL RAPIDE 4-0 (SUTURE) IMPLANT
SYR BULB EAR ULCER 3OZ GRN STR (SYRINGE) IMPLANT
SYR CONTROL 10ML LL (SYRINGE) ×2 IMPLANT
TOWEL GREEN STERILE FF (TOWEL DISPOSABLE) ×2 IMPLANT
TRAY DSU PREP LF (CUSTOM PROCEDURE TRAY) ×1 IMPLANT
TUBE CONNECTING 20X1/4 (TUBING) ×1 IMPLANT

## 2024-03-15 NOTE — Transfer of Care (Signed)
 Immediate Anesthesia Transfer of Care Note  Patient: Peter Manning  Procedure(s) Performed: EXCISION RIGHT EYE LESION (Right: Eye)  Patient Location: PACU  Anesthesia Type:General  Level of Consciousness: awake, alert , oriented, drowsy, and patient cooperative  Airway & Oxygen Therapy: Patient Spontanous Breathing and Patient connected to face mask oxygen  Post-op Assessment: Report given to RN and Post -op Vital signs reviewed and stable  Post vital signs: Reviewed and stable  Last Vitals:  Vitals Value Taken Time  BP    Temp    Pulse 57 03/15/24 14:35  Resp 12 03/15/24 14:35  SpO2 95 % 03/15/24 14:35  Vitals shown include unfiled device data.  Last Pain:  Vitals:   03/15/24 1250  TempSrc: Temporal  PainSc: 5       Patients Stated Pain Goal: 5 (03/15/24 1250)  Complications: No notable events documented.

## 2024-03-15 NOTE — Anesthesia Procedure Notes (Signed)
 Procedure Name: LMA Insertion Date/Time: 03/15/2024 1:58 PM  Performed by: Emilio Rock BIRCH, CRNAPre-anesthesia Checklist: Patient identified, Emergency Drugs available, Suction available and Patient being monitored Patient Re-evaluated:Patient Re-evaluated prior to induction Oxygen Delivery Method: Circle System Utilized Preoxygenation: Pre-oxygenation with 100% oxygen Induction Type: IV induction Ventilation: Mask ventilation without difficulty LMA: LMA inserted LMA Size: 5.0 Number of attempts: 1 Airway Equipment and Method: bite block Placement Confirmation: positive ETCO2 Tube secured with: Tape Dental Injury: Teeth and Oropharynx as per pre-operative assessment

## 2024-03-15 NOTE — Anesthesia Postprocedure Evaluation (Signed)
 Anesthesia Post Note  Patient: Peter Manning  Procedure(s) Performed: EXCISION RIGHT EYE LESION (Right: Eye)     Patient location during evaluation: PACU Anesthesia Type: General Level of consciousness: awake and alert Pain management: pain level controlled Vital Signs Assessment: post-procedure vital signs reviewed and stable Respiratory status: spontaneous breathing, nonlabored ventilation, respiratory function stable and patient connected to nasal cannula oxygen Cardiovascular status: blood pressure returned to baseline and stable Postop Assessment: no apparent nausea or vomiting Anesthetic complications: no   No notable events documented.  Last Vitals:  Vitals:   03/15/24 1500 03/15/24 1515  BP: 122/76 122/75  Pulse: (!) 52 (!) 52  Resp: 12 12  Temp:    SpO2: 96% 94%    Last Pain:  Vitals:   03/15/24 1500  TempSrc:   PainSc: Asleep                 Garnette DELENA Gab

## 2024-03-15 NOTE — Op Note (Signed)
 DATE OF OPERATION: 03/15/2024  LOCATION: [facility] operating Room  PREOPERATIVE DIAGNOSIS: Right lateral canthus skin lesion  POSTOPERATIVE DIAGNOSIS: Same  PROCEDURE: Excision of skin lesion  SURGEON: Marinell Birmingham, MD  ASSISTANT: Not applicable  EBL: 2 cc  CONDITION: Stable  COMPLICATIONS: None  INDICATION: The patient, Peter Manning, is a 68 y.o. male born on [birth year], is here for removal of the skin lesion for pathologic diagnosis.   PROCEDURE DETAILS:  The patient was seen prior to surgery and marked.  The IV antibiotics were given. The patient was taken to the operating room and given a general anesthetic. A standard time out was performed and all information was confirmed by those in the room. SCDs were placed.   The right side of the face was prepped and draped in usual sterile manner using dilute Betadine.  A corneal protector was placed.  The skin underneath the skin lesion was injected with 2 mL of quarter percent Marcaine  with epinephrine .  An elliptical incision was made at the base of the lesion and the lesion was removed intact.  It was sent to pathology for routine examination.  After obtaining hemostasis the wound was closed with interrupted 5-0 fast absorbing gut.  The total length of the incision was approximately 7 mm.  The corneal protector was removed and the eye irrigated with BSS.  The patient was awakened from anesthesia without incident transferred to the recovery room in good condition.  All instrument needle and sponge counts were reported as correct and there were no complications noted. The patient was allowed to wake up and taken to recovery room in stable condition at the end of the case. The family was notified at the end of the case.

## 2024-03-15 NOTE — Discharge Instructions (Addendum)
  Post Anesthesia Home Care Instructions  Activity: Get plenty of rest for the remainder of the day. A responsible individual must stay with you for 24 hours following the procedure.  For the next 24 hours, DO NOT: -Drive a car -Advertising copywriter -Drink alcoholic beverages -Take any medication unless instructed by your physician -Make any legal decisions or sign important papers.  Meals: Start with liquid foods such as gelatin or soup. Progress to regular foods as tolerated. Avoid greasy, spicy, heavy foods. If nausea and/or vomiting occur, drink only clear liquids until the nausea and/or vomiting subsides. Call your physician if vomiting continues.  Special Instructions/Symptoms: Your throat may feel dry or sore from the anesthesia or the breathing tube placed in your throat during surgery. If this causes discomfort, gargle with warm salt water . The discomfort should disappear within 24 hours.  If you had a scopolamine patch placed behind your ear for the management of post- operative nausea and/or vomiting:  1. The medication in the patch is effective for 72 hours, after which it should be removed.  Wrap patch in a tissue and discard in the trash. Wash hands thoroughly with soap and water . 2. You may remove the patch earlier than 72 hours if you experience unpleasant side effects which may include dry mouth, dizziness or visual disturbances. 3. Avoid touching the patch. Wash your hands with soap and water  after contact with the patch.      Next dose of Tylenol  may be given at 8:00pm if needed.  You may wash the area in 24 hours. Please wash with fingertips and pat the incision dry. Please call the office for any questions or concerns.  [phone]

## 2024-03-15 NOTE — Interval H&P Note (Signed)
 History and Physical Interval Note: No change in exam or indication for surgery All questions answered Site marked with his concurrence Will proceed with excision of right facial skin lesion at his request  03/15/2024 1:25 PM  Peter Manning  has presented today for surgery, with the diagnosis of l98.9.  The various methods of treatment have been discussed with the patient and family. After consideration of risks, benefits and other options for treatment, the patient has consented to  Procedure(s) with comments: REVISION, SCAR (N/A) - excision of skin lesion, right eye EXCISION, MASS, HEAD (N/A) - excision of skin lesion, right eye as a surgical intervention.  The patient's history has been reviewed, patient examined, no change in status, stable for surgery.  I have reviewed the patient's chart and labs.  Questions were answered to the patient's satisfaction.     [name]

## 2024-03-16 ENCOUNTER — Encounter (HOSPITAL_BASED_OUTPATIENT_CLINIC_OR_DEPARTMENT_OTHER): Payer: Self-pay | Admitting: Plastic Surgery

## 2024-03-18 LAB — SURGICAL PATHOLOGY

## 2024-03-21 ENCOUNTER — Encounter: Admitting: Plastic Surgery

## 2024-04-04 ENCOUNTER — Encounter: Admitting: Surgical

## 2024-04-05 ENCOUNTER — Encounter: Admitting: Surgical

## 2024-04-05 NOTE — Progress Notes (Deleted)
 68 year old male here for follow-up after excision of skin lesion of right eye with Dr. Waddell on 03/15/2024.  Patient is 3 weeks postop.  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  Negative for malignancy.

## 2024-04-18 ENCOUNTER — Encounter: Admitting: Surgical

## 2024-05-06 ENCOUNTER — Encounter: Payer: Self-pay | Admitting: Radiology

## 2024-05-29 ENCOUNTER — Encounter (HOSPITAL_COMMUNITY): Payer: Self-pay | Admitting: Emergency Medicine

## 2024-05-29 ENCOUNTER — Other Ambulatory Visit: Payer: Self-pay

## 2024-05-29 ENCOUNTER — Emergency Department (HOSPITAL_COMMUNITY)
Admission: EM | Admit: 2024-05-29 | Discharge: 2024-05-29 | Disposition: A | Discharge status: Home / Self Care | Attending: Emergency Medicine | Admitting: Emergency Medicine

## 2024-05-29 DIAGNOSIS — R739 Hyperglycemia, unspecified: Secondary | ICD-10-CM | POA: Insufficient documentation

## 2024-05-29 DIAGNOSIS — R112 Nausea with vomiting, unspecified: Secondary | ICD-10-CM | POA: Diagnosis present

## 2024-05-29 DIAGNOSIS — R197 Diarrhea, unspecified: Secondary | ICD-10-CM | POA: Diagnosis not present

## 2024-05-29 LAB — CBC WITH DIFFERENTIAL/PLATELET
Abs Immature Granulocytes: 0.03 K/uL (ref 0.00–0.07)
Basophils Absolute: 0.1 K/uL (ref 0.0–0.1)
Basophils Relative: 1 %
Eosinophils Absolute: 0.1 K/uL (ref 0.0–0.5)
Eosinophils Relative: 2 %
HCT: 44.7 % (ref 39.0–52.0)
Hemoglobin: 15.6 g/dL (ref 13.0–17.0)
Immature Granulocytes: 0 %
Lymphocytes Relative: 18 %
Lymphs Abs: 1.5 K/uL (ref 0.7–4.0)
MCH: 32.6 pg (ref 26.0–34.0)
MCHC: 34.9 g/dL (ref 30.0–36.0)
MCV: 93.3 fL (ref 80.0–100.0)
Monocytes Absolute: 0.4 K/uL (ref 0.1–1.0)
Monocytes Relative: 5 %
Neutro Abs: 6.3 K/uL (ref 1.7–7.7)
Neutrophils Relative %: 74 %
Platelets: 254 K/uL (ref 150–400)
RBC: 4.79 MIL/uL (ref 4.22–5.81)
RDW: 11.6 % (ref 11.5–15.5)
WBC: 8.4 K/uL (ref 4.0–10.5)
nRBC: 0 % (ref 0.0–0.2)

## 2024-05-29 LAB — COMPREHENSIVE METABOLIC PANEL WITH GFR
ALT: 19 U/L (ref 0–44)
AST: 18 U/L (ref 15–41)
Albumin: 3.9 g/dL (ref 3.5–5.0)
Alkaline Phosphatase: 139 U/L — ABNORMAL HIGH (ref 38–126)
Anion gap: 12 (ref 5–15)
BUN: 31 mg/dL — ABNORMAL HIGH (ref 8–23)
CO2: 23 mmol/L (ref 22–32)
Calcium: 10.3 mg/dL (ref 8.9–10.3)
Chloride: 99 mmol/L (ref 98–111)
Creatinine, Ser: 1.5 mg/dL — ABNORMAL HIGH (ref 0.61–1.24)
GFR, Estimated: 51 mL/min — ABNORMAL LOW (ref >60)
Glucose, Bld: 472 mg/dL — ABNORMAL HIGH (ref 70–99)
Potassium: 4.5 mmol/L (ref 3.5–5.1)
Sodium: 134 mmol/L — ABNORMAL LOW (ref 135–145)
Total Bilirubin: 0.8 mg/dL (ref 0.0–1.2)
Total Protein: 8.1 g/dL (ref 6.5–8.1)

## 2024-05-29 LAB — LIPASE, BLOOD: Lipase: 38 U/L (ref 11–51)

## 2024-05-29 LAB — URINALYSIS, ROUTINE W REFLEX MICROSCOPIC
Bacteria, UA: NONE SEEN
Bilirubin Urine: NEGATIVE
Glucose, UA: >=500 mg/dL — AB
Hgb urine dipstick: NEGATIVE
Ketones, ur: NEGATIVE mg/dL
Leukocytes,Ua: NEGATIVE
Nitrite: NEGATIVE
Protein, ur: NEGATIVE mg/dL
Specific Gravity, Urine: 1.024 (ref 1.005–1.030)
pH: 5 (ref 5.0–8.0)

## 2024-05-29 LAB — CBG MONITORING, ED
Glucose-Capillary: 531 mg/dL (ref 70–99)
Glucose-Capillary: 407 mg/dL — ABNORMAL HIGH (ref 70–99)
Glucose-Capillary: 350 mg/dL — ABNORMAL HIGH (ref 70–99)

## 2024-05-29 MED ORDER — ONDANSETRON 4 MG PO TBDP: 4.0000 mg | ORAL_TABLET | Freq: Once | ORAL | Status: DC

## 2024-05-29 MED ORDER — INSULIN ASPART 100 UNIT/ML IJ SOLN
10.0000 [IU] | Freq: Once | INTRAMUSCULAR | Status: AC
  Administered 2024-05-29: 10 [IU] via SUBCUTANEOUS
  Filled 2024-05-29: qty 10

## 2024-05-29 MED ORDER — ONDANSETRON 4 MG PO TBDP: 4.0000 mg | ORAL_TABLET | Freq: Three times a day (TID) | ORAL | 0 refills | Status: AC | PRN

## 2024-05-29 MED ORDER — LACTATED RINGERS IV BOLUS
1000.0000 mL | Freq: Once | INTRAVENOUS | Status: AC
  Administered 2024-05-29: 1000 mL via INTRAVENOUS

## 2024-05-29 NOTE — ED Triage Notes (Signed)
 BIB [facility] from home for N/V/D all day. Current CBG 552, pt states that he has been taking insulin  but has not brought BG down.  Abd pain in all 4 quads.   BP 150/96 HR 80 Resp 16 Spo2 98% RA Temp 96.8 (temporal)

## 2024-05-29 NOTE — ED Provider Triage Note (Signed)
 Emergency Medicine Provider Triage Evaluation Note  Peter Manning , a 68 y.o. male  was evaluated in triage.  Pt complains of nausea, vomiting, high blood sugar.  States he began having symptoms stated above tonight.  Denies fevers, diarrhea.  Denies abdominal pain.  Reports compliance on diabetic medication.  Arrives hyperglycemic.  Alert and oriented x 4.  Review of Systems  Positive:  Negative:   Physical Exam  BP 125/66 (BP Location: Left Arm)   Pulse 79   Temp 98.6 F (37 C) (Oral)   Resp 18   Ht 5' 8 (1.727 m)   Wt 102.1 kg   SpO2 93%   BMI 34.21 kg/m  Gen:   Awake, no distress   Resp:  Normal effort  MSK:   Moves extremities without difficulty  Other:    Medical Decision Making  Medically screening exam initiated at 1:37 AM.  Appropriate orders placed.  Peter Manning was informed that the remainder of the evaluation will be completed by another provider, this initial triage assessment does not replace that evaluation, and the importance of remaining in the ED until their evaluation is complete.     Ruthell Lonni FALCON, PA-C 05/29/24 [phone]

## 2024-05-29 NOTE — ED Provider Notes (Signed)
 [facility] Emergency Department Provider Note MRN:  [id number]  Arrival date & time: 05/29/24     Chief Complaint   Abdominal Pain, Nausea (vomintin), Emesis, and Diarrhea   History of Present Illness   Peter Manning is a 68 y.o. year-old male presents to the ED with chief complaint of n/v/d. States that he was recently caring for a sick grandchild.  States that he is starting to feel better, but expresses concern about the blood sugar remaining high.  Denies fever.  Denies seeing any blood in the vomit or diarrhea.  History provided by patient.   Review of Systems  Pertinent positive and negative review of systems noted in HPI.    Physical Exam   Vitals:   05/29/24 0024 05/29/24 0504  BP: 125/66 115/68  Pulse: 79 67  Resp: 18 14  Temp: 98.6 F (37 C) 98.3 F (36.8 C)  SpO2: 93% 98%    CONSTITUTIONAL:  non toxic-appearing, NAD NEURO:  Alert and oriented x 3, CN 3-12 grossly intact EYES:  eyes equal and reactive ENT/NECK:  Supple, no stridor  CARDIO:  normal rate, regular rhythm, appears well-perfused  PULM:  No respiratory distress, CTAB GI/GU:  non-distended, generalized discomfort without focal tenderness MSK/SPINE:  No gross deformities, no edema, moves all extremities  SKIN:  no rash, atraumatic   *Additional and/or pertinent findings included in MDM below  Diagnostic and Interventional Summary    EKG Interpretation Date/Time:    Ventricular Rate:    PR Interval:    QRS Duration:    QT Interval:    QTC Calculation:   R Axis:      Text Interpretation:         Labs Reviewed  URINALYSIS, ROUTINE W REFLEX MICROSCOPIC - Abnormal; Notable for the following components:      Result Value   Color, Urine STRAW (*)    Glucose, UA >=500 (*)    All other components within normal limits  COMPREHENSIVE METABOLIC PANEL WITH GFR - Abnormal; Notable for the following components:   Sodium 134 (*)    Glucose, Bld 472 (*)    BUN 31 (*)     Creatinine, Ser 1.50 (*)    Alkaline Phosphatase 139 (*)    GFR, Estimated 51 (*)    All other components within normal limits  CBG MONITORING, ED - Abnormal; Notable for the following components:   Glucose-Capillary 531 (*)    All other components within normal limits  CBG MONITORING, ED - Abnormal; Notable for the following components:   Glucose-Capillary 407 (*)    All other components within normal limits  CBG MONITORING, ED - Abnormal; Notable for the following components:   Glucose-Capillary 350 (*)    All other components within normal limits  CBC WITH DIFFERENTIAL/PLATELET  LIPASE, BLOOD  CBG MONITORING, ED    No orders to display    Medications  ondansetron  (ZOFRAN -ODT) disintegrating tablet 4 mg (0 mg Oral Hold 05/29/24 0207)  lactated ringers  bolus 1,000 mL (1,000 mLs Intravenous New Bag/Given 05/29/24 0331)  insulin  aspart (novoLOG ) injection 10 Units (10 Units Subcutaneous Given 05/29/24 0330)     Procedures  /  Critical Care Procedures  ED Course and Medical Decision Making  I have reviewed the triage vital signs, the nursing notes, and pertinent available records from the EMR.  Social Determinants Affecting Complexity of Care: Patient has no clinically significant social determinants affecting this chief complaint..   ED Course:    Medical Decision Making  Patient here with nausea, vomiting, and diarrhea.  States that he was around a grandchild that had similar symptoms.  States that most of his symptoms have improved, but his blood sugar has been running high since he was sick and he has had trouble getting it down.  States that it was in the 500s.  Will give fluids and anticipate giving some insulin .  Labs are notable for glucose in the 400s.  This improved to 350 after some subcutaneous insulin  and fluid.  Mildly elevated creatinine, which correlates with his recent vomiting and diarrheal symptoms.  He was treated with fluid in the ED.  No marked  leukocytosis.  Vital signs are stable.  Patient not vomiting now.  He appears stable for discharge home.  Amount and/or Complexity of Data Reviewed Labs: ordered.  Risk Prescription drug management.         Consultants: No consultations were needed in caring for this patient.   Treatment and Plan: I considered admission due to patient's initial presentation, but after considering the examination and diagnostic results, patient will not require admission and can be discharged with outpatient follow-up.    Final Clinical Impressions(s) / ED Diagnoses     ICD-10-CM   1. Nausea vomiting and diarrhea  R11.2    R19.7     2. Hyperglycemia  R73.9       ED Discharge Orders          Ordered    ondansetron  (ZOFRAN -ODT) 4 MG disintegrating tablet  Every 8 hours PRN        05/29/24 0531              Discharge Instructions Discussed with and Provided to Patient:   Discharge Instructions   None      Vicky Charleston, PA-C 05/29/24 0533    Emil Share, DO 05/29/24 [phone]

## 2024-06-21 ENCOUNTER — Other Ambulatory Visit: Payer: Self-pay | Admitting: Nurse Practitioner

## 2024-06-21 DIAGNOSIS — Z87891 Personal history of nicotine dependence: Secondary | ICD-10-CM

## 2024-07-02 ENCOUNTER — Ambulatory Visit
Admission: RE | Admit: 2024-07-02 | Discharge: 2024-07-02 | Disposition: A | Discharge status: Home / Self Care | Source: Ambulatory Visit | Attending: Nurse Practitioner | Admitting: Nurse Practitioner

## 2024-07-02 DIAGNOSIS — Z87891 Personal history of nicotine dependence: Secondary | ICD-10-CM
